# Patient Record
Sex: Female | Born: 1975 | Race: White | Hispanic: No | Marital: Married | Smoking: Never smoker
Health system: Southern US, Community
[De-identification: ages and names within clinical notes are randomized; demographics above are authoritative.]

## PROBLEM LIST (undated history)

## (undated) DIAGNOSIS — T4145XA Adverse effect of unspecified anesthetic, initial encounter: Secondary | ICD-10-CM

## (undated) DIAGNOSIS — I10 Essential (primary) hypertension: Secondary | ICD-10-CM

## (undated) DIAGNOSIS — I499 Cardiac arrhythmia, unspecified: Secondary | ICD-10-CM

## (undated) DIAGNOSIS — J386 Stenosis of larynx: Secondary | ICD-10-CM

## (undated) DIAGNOSIS — T8859XA Other complications of anesthesia, initial encounter: Secondary | ICD-10-CM

## (undated) DIAGNOSIS — J40 Bronchitis, not specified as acute or chronic: Secondary | ICD-10-CM

## (undated) DIAGNOSIS — R112 Nausea with vomiting, unspecified: Secondary | ICD-10-CM

## (undated) DIAGNOSIS — M1990 Unspecified osteoarthritis, unspecified site: Secondary | ICD-10-CM

## (undated) DIAGNOSIS — K219 Gastro-esophageal reflux disease without esophagitis: Secondary | ICD-10-CM

## (undated) DIAGNOSIS — Z8489 Family history of other specified conditions: Secondary | ICD-10-CM

## (undated) DIAGNOSIS — R0602 Shortness of breath: Secondary | ICD-10-CM

## (undated) DIAGNOSIS — R51 Headache: Secondary | ICD-10-CM

## (undated) DIAGNOSIS — E119 Type 2 diabetes mellitus without complications: Secondary | ICD-10-CM

## (undated) DIAGNOSIS — Z9889 Other specified postprocedural states: Secondary | ICD-10-CM

## (undated) HISTORY — PX: TONGUE FLAP RELEASE: SHX2537

## (undated) HISTORY — DX: Essential (primary) hypertension: I10

## (undated) HISTORY — DX: Unspecified osteoarthritis, unspecified site: M19.90

## (undated) HISTORY — PX: OTHER SURGICAL HISTORY: SHX169

## (undated) HISTORY — DX: Stenosis of larynx: J38.6

---

## 2000-09-07 ENCOUNTER — Other Ambulatory Visit: Admission: RE | Admit: 2000-09-07 | Discharge: 2000-09-07 | Payer: Self-pay | Admitting: *Deleted

## 2005-11-15 ENCOUNTER — Encounter: Payer: Self-pay | Admitting: General Practice

## 2008-04-25 ENCOUNTER — Ambulatory Visit: Payer: Self-pay | Admitting: Podiatry

## 2012-07-18 ENCOUNTER — Other Ambulatory Visit (HOSPITAL_COMMUNITY): Payer: Self-pay | Admitting: Otolaryngology

## 2012-07-19 ENCOUNTER — Encounter (HOSPITAL_COMMUNITY): Payer: Self-pay

## 2012-07-19 ENCOUNTER — Encounter (HOSPITAL_COMMUNITY): Payer: Self-pay | Admitting: Pharmacy Technician

## 2012-07-20 ENCOUNTER — Ambulatory Visit (HOSPITAL_COMMUNITY): Payer: 59 | Admitting: Certified Registered"

## 2012-07-20 ENCOUNTER — Encounter (HOSPITAL_COMMUNITY)
Admission: RE | Disposition: A | Discharge status: Home / Self Care | Payer: Self-pay | Source: Ambulatory Visit | Attending: Otolaryngology

## 2012-07-20 ENCOUNTER — Ambulatory Visit (HOSPITAL_COMMUNITY): Payer: 59

## 2012-07-20 ENCOUNTER — Encounter (HOSPITAL_COMMUNITY): Payer: Self-pay | Admitting: *Deleted

## 2012-07-20 ENCOUNTER — Encounter (HOSPITAL_COMMUNITY): Payer: Self-pay | Admitting: Certified Registered"

## 2012-07-20 ENCOUNTER — Ambulatory Visit (HOSPITAL_COMMUNITY)
Admission: RE | Admit: 2012-07-20 | Discharge: 2012-07-20 | Disposition: A | Discharge status: Home / Self Care | Payer: 59 | Source: Ambulatory Visit | Attending: Otolaryngology | Admitting: Otolaryngology

## 2012-07-20 DIAGNOSIS — K219 Gastro-esophageal reflux disease without esophagitis: Secondary | ICD-10-CM | POA: Insufficient documentation

## 2012-07-20 DIAGNOSIS — J386 Stenosis of larynx: Secondary | ICD-10-CM | POA: Insufficient documentation

## 2012-07-20 HISTORY — DX: Gastro-esophageal reflux disease without esophagitis: K21.9

## 2012-07-20 HISTORY — DX: Shortness of breath: R06.02

## 2012-07-20 HISTORY — DX: Headache: R51

## 2012-07-20 HISTORY — DX: Cardiac arrhythmia, unspecified: I49.9

## 2012-07-20 HISTORY — DX: Family history of other specified conditions: Z84.89

## 2012-07-20 HISTORY — PX: MICROLARYNGOSCOPY WITH LASER AND BALLOON DILATION: SHX5973

## 2012-07-20 HISTORY — DX: Other specified postprocedural states: Z98.890

## 2012-07-20 HISTORY — DX: Bronchitis, not specified as acute or chronic: J40

## 2012-07-20 HISTORY — DX: Adverse effect of unspecified anesthetic, initial encounter: T41.45XA

## 2012-07-20 HISTORY — DX: Nausea with vomiting, unspecified: R11.2

## 2012-07-20 HISTORY — DX: Other complications of anesthesia, initial encounter: T88.59XA

## 2012-07-20 LAB — CBC
MCV: 83.3 fL (ref 78.0–100.0)
Platelets: 235 10*3/uL (ref 150–400)
RBC: 5.04 MIL/uL (ref 3.87–5.11)
WBC: 6.8 10*3/uL (ref 4.0–10.5)

## 2012-07-20 LAB — BASIC METABOLIC PANEL
CO2: 23 mEq/L (ref 19–32)
Chloride: 102 mEq/L (ref 96–112)
Creatinine, Ser: 0.7 mg/dL (ref 0.50–1.10)
Potassium: 4.3 mEq/L (ref 3.5–5.1)

## 2012-07-20 LAB — HCG, SERUM, QUALITATIVE: Preg, Serum: NEGATIVE

## 2012-07-20 SURGERY — INJECTION, MITOMYCIN C
Anesthesia: General | Site: Throat | Wound class: Clean Contaminated

## 2012-07-20 MED ORDER — PROPOFOL INFUSION 10 MG/ML OPTIME
INTRAVENOUS | Status: DC | PRN
  Administered 2012-07-20: 200 ug/kg/min via INTRAVENOUS

## 2012-07-20 MED ORDER — LACTATED RINGERS IV SOLN
INTRAVENOUS | Status: DC | PRN
  Administered 2012-07-20: 11:00:00 via INTRAVENOUS

## 2012-07-20 MED ORDER — OXYCODONE HCL 5 MG PO TABS: 5.0000 mg | ORAL_TABLET | Freq: Once | ORAL | Status: DC | PRN

## 2012-07-20 MED ORDER — FENTANYL CITRATE 0.05 MG/ML IJ SOLN
25.0000 ug | INTRAMUSCULAR | Status: DC | PRN
  Administered 2012-07-20: 50 ug via INTRAVENOUS

## 2012-07-20 MED ORDER — PROMETHAZINE HCL 25 MG/ML IJ SOLN: 6.2500 mg | INTRAMUSCULAR | Status: DC | PRN

## 2012-07-20 MED ORDER — HYDROCODONE-ACETAMINOPHEN 5-325 MG PO TABS: 1.0000 | ORAL_TABLET | Freq: Four times a day (QID) | ORAL | Status: DC | PRN

## 2012-07-20 MED ORDER — PROPOFOL 10 MG/ML IV BOLUS
INTRAVENOUS | Status: DC | PRN
  Administered 2012-07-20: 200 mg via INTRAVENOUS

## 2012-07-20 MED ORDER — FENTANYL CITRATE 0.05 MG/ML IJ SOLN
INTRAMUSCULAR | Status: AC
  Filled 2012-07-20: qty 2

## 2012-07-20 MED ORDER — MIDAZOLAM HCL 2 MG/2ML IJ SOLN: 0.5000 mg | Freq: Once | INTRAMUSCULAR | Status: DC | PRN

## 2012-07-20 MED ORDER — SUCCINYLCHOLINE CHLORIDE 20 MG/ML IJ SOLN
INTRAMUSCULAR | Status: DC | PRN
  Administered 2012-07-20: 200 mg via INTRAVENOUS

## 2012-07-20 MED ORDER — MITOMYCIN-C INJECTION USE IN OR ONLY (0.4 MG/ML)
INTRAVENOUS | Status: DC | PRN
  Administered 2012-07-20: 1 mL

## 2012-07-20 MED ORDER — EPINEPHRINE HCL (NASAL) 0.1 % NA SOLN
NASAL | Status: DC | PRN
  Administered 2012-07-20: 30 mL via NASAL

## 2012-07-20 MED ORDER — MIDAZOLAM HCL 5 MG/5ML IJ SOLN
INTRAMUSCULAR | Status: DC | PRN
  Administered 2012-07-20 (×2): 2 mg via INTRAVENOUS

## 2012-07-20 MED ORDER — OXYCODONE HCL 5 MG/5ML PO SOLN: 5.0000 mg | Freq: Once | ORAL | Status: DC | PRN

## 2012-07-20 MED ORDER — NONFORMULARY OR COMPOUNDED ITEM
1.0000 mL | Status: DC
  Filled 2012-07-20: qty 1

## 2012-07-20 MED ORDER — DEXAMETHASONE SODIUM PHOSPHATE 10 MG/ML IJ SOLN
INTRAMUSCULAR | Status: DC | PRN
  Administered 2012-07-20: 12 mg via INTRAVENOUS

## 2012-07-20 MED ORDER — SUCCINYLCHOLINE CHLORIDE 20 MG/ML IJ SOLN
250.0000 mg | INTRAVENOUS | Status: DC | PRN
  Administered 2012-07-20: 8 mg/min via INTRAVENOUS

## 2012-07-20 MED ORDER — 0.9 % SODIUM CHLORIDE (POUR BTL) OPTIME
TOPICAL | Status: DC | PRN
  Administered 2012-07-20: 1000 mL

## 2012-07-20 MED ORDER — EPINEPHRINE HCL (NASAL) 0.1 % NA SOLN
NASAL | Status: AC
  Filled 2012-07-20: qty 30

## 2012-07-20 MED ORDER — LIDOCAINE HCL (CARDIAC) 20 MG/ML IV SOLN
INTRAVENOUS | Status: DC | PRN
  Administered 2012-07-20: 20 mg via INTRAVENOUS

## 2012-07-20 MED ORDER — STERILE WATER FOR IRRIGATION IR SOLN
Status: DC | PRN
  Administered 2012-07-20: 1000 mL

## 2012-07-20 MED ORDER — FENTANYL CITRATE 0.05 MG/ML IJ SOLN
INTRAMUSCULAR | Status: DC | PRN
  Administered 2012-07-20: 50 ug via INTRAVENOUS

## 2012-07-20 MED ORDER — ACETAMINOPHEN 10 MG/ML IV SOLN
1000.0000 mg | Freq: Four times a day (QID) | INTRAVENOUS | Status: DC
  Administered 2012-07-20: 1000 mg via INTRAVENOUS

## 2012-07-20 MED ORDER — ACETAMINOPHEN 10 MG/ML IV SOLN
INTRAVENOUS | Status: AC
  Filled 2012-07-20: qty 100

## 2012-07-20 MED ORDER — MEPERIDINE HCL 25 MG/ML IJ SOLN: 6.2500 mg | INTRAMUSCULAR | Status: DC | PRN

## 2012-07-20 SURGICAL SUPPLY — 35 items
APPLICATOR COTTON TIP 6IN STRL (MISCELLANEOUS) ×3 IMPLANT
BALLN PULM 15 16.5 18X75 (BALLOONS)
BALLOON PULM 15 16.5 18X75 (BALLOONS) IMPLANT
BIT DRILL QC 2.7MM (BIT) ×2 IMPLANT
CANISTER SUCTION 2500CC (MISCELLANEOUS) ×3 IMPLANT
CLOTH BEACON ORANGE TIMEOUT ST (SAFETY) ×3 IMPLANT
CONT SPEC 4OZ CLIKSEAL STRL BL (MISCELLANEOUS) IMPLANT
COVER MAYO STAND STRL (DRAPES) ×3 IMPLANT
COVER TABLE BACK 60X90 (DRAPES) ×3 IMPLANT
CRADLE DONUT ADULT HEAD (MISCELLANEOUS) IMPLANT
DEPRESSOR TONGUE BLADE STERILE (MISCELLANEOUS) IMPLANT
DRAPE PROXIMA HALF (DRAPES) ×3 IMPLANT
GAUZE SPONGE 4X4 16PLY XRAY LF (GAUZE/BANDAGES/DRESSINGS) ×3 IMPLANT
GLOVE BIO SURGEON STRL SZ7.5 (GLOVE) ×3 IMPLANT
GLOVE SURG SS PI 6.5 STRL IVOR (GLOVE) ×6 IMPLANT
GLOVE SURG SS PI 7.5 STRL IVOR (GLOVE) ×3 IMPLANT
GOWN STRL NON-REIN LRG LVL3 (GOWN DISPOSABLE) IMPLANT
GUARD TEETH (MISCELLANEOUS) IMPLANT
KIT BASIN OR (CUSTOM PROCEDURE TRAY) ×3 IMPLANT
KIT ROOM TURNOVER OR (KITS) ×3 IMPLANT
MARKER SKIN DUAL TIP RULER LAB (MISCELLANEOUS) IMPLANT
NS IRRIG 1000ML POUR BTL (IV SOLUTION) ×3 IMPLANT
PAD ARMBOARD 7.5X6 YLW CONV (MISCELLANEOUS) ×6 IMPLANT
PAD EYE OVAL STERILE LF (GAUZE/BANDAGES/DRESSINGS) IMPLANT
PATTIES SURGICAL .5 X.5 (GAUZE/BANDAGES/DRESSINGS) ×3 IMPLANT
PATTIES SURGICAL .5 X3 (DISPOSABLE) IMPLANT
PROS DRILL BIT QC 2.7MM (BIT) ×3
SOLUTION ANTI FOG 6CC (MISCELLANEOUS) IMPLANT
SPONGE GAUZE 4X4 12PLY (GAUZE/BANDAGES/DRESSINGS) IMPLANT
STAPLER TA60 3.5 010317 (STAPLE) ×3 IMPLANT
SUT SILK 2 0 SH (SUTURE) ×3 IMPLANT
SYR INFLATE BILIARY GAUGE (MISCELLANEOUS) ×3 IMPLANT
TOWEL OR 17X24 6PK STRL BLUE (TOWEL DISPOSABLE) ×6 IMPLANT
TUBE CONNECTING 12X1/4 (SUCTIONS) ×3 IMPLANT
WATER STERILE IRR 1000ML POUR (IV SOLUTION) ×3 IMPLANT

## 2012-07-20 NOTE — Anesthesia Preprocedure Evaluation (Addendum)
Anesthesia Evaluation  Patient identified by MRN, date of birth, ID band Patient awake    Reviewed: Allergy & Precautions, H&P , NPO status , Patient's Chart, lab work & pertinent test results  History of Anesthesia Complications (+) PONV  Airway Mallampati: I TM Distance: >3 FB Neck ROM: Full    Dental  (+) Teeth Intact and Dental Advisory Given   Pulmonary shortness of breath and at rest,  breath sounds clear to auscultation  Pulmonary exam normal       Cardiovascular + dysrhythmias (palpitations) Rhythm:Regular Rate:Normal     Neuro/Psych  Headaches,    GI/Hepatic Neg liver ROS, GERD-  Medicated and Controlled,  Endo/Other  Morbid obesity  Renal/GU negative Renal ROS     Musculoskeletal   Abdominal (+) + obese,   Peds  Hematology   Anesthesia Other Findings   Reproductive/Obstetrics                          Anesthesia Physical Anesthesia Plan  ASA: II  Anesthesia Plan: General   Post-op Pain Management:    Induction: Intravenous and Rapid sequence  Airway Management Planned: Oral ETT  Additional Equipment:   Intra-op Plan:   Post-operative Plan: Extubation in OR  Informed Consent: I have reviewed the patients History and Physical, chart, labs and discussed the procedure including the risks, benefits and alternatives for the proposed anesthesia with the patient or authorized representative who has indicated his/her understanding and acceptance.   Dental advisory given  Plan Discussed with: Anesthesiologist, Surgeon and CRNA  Anesthesia Plan Comments: (Jet ventilation Plan routine monitors, GETA)       Anesthesia Quick Evaluation

## 2012-07-20 NOTE — Brief Op Note (Signed)
07/20/2012  11:36 AM  PATIENT:  Lisa [name]  37 y.o. female  PRE-OPERATIVE DIAGNOSIS:  SUBGLOTTIC STENOSIS  POST-OPERATIVE DIAGNOSIS:  SUBGLOTTIC STENOSIS  PROCEDURE:  SMDL with CO2 laser dilation of subglottic stenosis, mitomycin C application  SURGEON:  Surgeon(s) and Role:    * Christia Reading, MD - Primary  PHYSICIAN ASSISTANT:   ASSISTANTS: none   ANESTHESIA:   general  EBL:     BLOOD ADMINISTERED:none  DRAINS: none   SPECIMEN:  No Specimen  DISPOSITION OF SPECIMEN:  N/A  COUNTS:  YES  TOURNIQUET:  * No tourniquets in log *  DICTATION: .Other Dictation: Dictation Number [id number]  PLAN OF CARE: Discharge to home after PACU  PATIENT DISPOSITION:  PACU - hemodynamically stable.   Delay start of Pharmacological VTE agent (>24hrs) due to surgical blood loss or risk of bleeding: no

## 2012-07-20 NOTE — Anesthesia Postprocedure Evaluation (Signed)
  Anesthesia Post-op Note  Patient: Lisa [name]  Procedure(s) Performed: Procedure(s) with comments: MITOMYCIN C INJECTION (N/A) MICROLARYNGOSCOPY WITH LASER AND BALLOON DILATION (N/A) - WITH JET VENTURI VENTILATION  Patient Location: PACU  Anesthesia Type:General  Level of Consciousness: awake, alert , oriented and patient cooperative  Airway and Oxygen Therapy: Patient Spontanous Breathing  Post-op Pain: none  Post-op Assessment: Post-op Vital signs reviewed, Patient's Cardiovascular Status Stable, Respiratory Function Stable, Patent Airway, No signs of Nausea or vomiting and Pain level controlled  Post-op Vital Signs: Reviewed and stable  Complications: No apparent anesthesia complications

## 2012-07-20 NOTE — H&P (Signed)
Lisa [name] is an 37 y.o. female.   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  Past Medical History  Diagnosis Date  . Complication of anesthesia   . PONV (postoperative nausea and vomiting)   . Family history of anesthesia complication     "morphine night terrors"  . Dysrhythmia     "does not see cardiologist"  . Shortness of breath     "wheezing"  . Bronchitis     hx of  . GERD (gastroesophageal reflux disease)   . Headache     "migraines"    Past Surgical History  Procedure Laterality Date  . Cesarean section      x 2  . Widom extractions    . Tongue flap release      History reviewed. No pertinent family history. Social History:  reports that she has never smoked. She does not have any smokeless tobacco history on file. She reports that  drinks alcohol. She reports that she does not use illicit drugs.  Allergies:  Allergies  Allergen Reactions  . Latex Hives    Rash and hives    Medications Prior to Admission  Medication Sig Dispense Refill  . budesonide (PULMICORT) 180 MCG/ACT inhaler Inhale 1 puff into the lungs 2 (two) times daily.      Marland Kitchen omeprazole (PRILOSEC) 40 MG capsule Take 40 mg by mouth 2 (two) times daily.        Results for orders placed during the hospital encounter of 07/20/12 (from the past 48 hour(s))  CBC     Status: None   Collection Time    07/20/12  9:04 AM      Result Value Range   WBC 6.8  4.0 - 10.5 K/uL   RBC 5.04  3.87 - 5.11 MIL/uL   Hemoglobin 14.6  12.0 - 15.0 g/dL   HCT 96.0  45.4 - 09.8 %   MCV 83.3  78.0 - 100.0 fL   MCH 29.0  26.0 - 34.0 pg   MCHC 34.8  30.0 - 36.0 g/dL   RDW 11.9  14.7 - 82.9 %   Platelets 235  150 - 400 K/uL  BASIC METABOLIC PANEL     Status: Abnormal   Collection Time    07/20/12  9:12 AM      Result Value  Range   Sodium 136  135 - 145 mEq/L   Potassium 4.3  3.5 - 5.1 mEq/L   Chloride 102  96 - 112 mEq/L   CO2 23  19 - 32 mEq/L   Glucose, Bld 109 (*) 70 - 99 mg/dL   BUN 15  6 - 23 mg/dL   Creatinine, Ser 5.62  0.50 - 1.10 mg/dL   Calcium 9.8  8.4 - 13.0 mg/dL   GFR calc non Af Amer >90  >90 mL/min   GFR calc Af Amer >90  >90 mL/min   Comment:            The eGFR has been calculated     using the CKD EPI equation.     This calculation has not been     validated in all clinical     situations.     eGFR's persistently     <90 mL/min signify     possible Chronic Kidney Disease.  HCG, SERUM, QUALITATIVE  Status: None   Collection Time    07/20/12  9:12 AM      Result Value Range   Preg, Serum NEGATIVE  NEGATIVE   Comment:            THE SENSITIVITY OF THIS     METHODOLOGY IS >10 mIU/mL.   Dg Chest 2 View  07/20/2012  *RADIOLOGY REPORT*  Clinical Data: Preop.  Subglottic stenosis.  CHEST - 2 VIEW  Comparison: None.  Findings: Normal heart size.  Low volumes.  No obvious consolidation or mass.  No pneumothorax.  No pleural fluid.  Mild bronchitic changes are likely chronic.  IMPRESSION: No active cardiopulmonary disease.   Original Report Authenticated By: Jolaine Click, M.D.     Review of Systems  All other systems reviewed and are negative.    Blood pressure 133/85, pulse 99, temperature 97.3 F (36.3 C), temperature source Oral, resp. rate 20, height 5\' 5"  (1.651 m), weight 123.8 kg (272 lb 14.9 oz), last menstrual period 07/06/2012, SpO2 99.00%. Physical Exam  Constitutional: She is oriented to person, place, and time. She appears well-developed and well-nourished. No distress.  HENT:  Head: Normocephalic and atraumatic.  Right Ear: External ear normal.  Left Ear: External ear normal.  Nose: Nose normal.  Mouth/Throat: Oropharynx is clear and moist.  Eyes: Conjunctivae and EOM are normal. Pupils are equal, round, and reactive to light.  Neck: Normal range of motion. Neck  supple.  Cardiovascular: Normal rate.   Respiratory: Effort normal.  Mild soft inspiratory stridor, especially with deep inspiration.  GI:  Did not examine.  Genitourinary:  Did not examine.  Musculoskeletal: Normal range of motion.  Neurological: She is alert and oriented to person, place, and time. No cranial nerve deficit.  Skin: Skin is warm and dry.  Psychiatric: She has a normal mood and affect. Her behavior is normal. Judgment and thought content normal.     Assessment/Plan Subglottic stenosis To OR for SMDL with CO2 laser subglottic dilation, application of mitomycin C.  BATES, DWIGHT 07/20/2012, 10:22 AM

## 2012-07-20 NOTE — Progress Notes (Signed)
Attempted to call Dr. Jenne Pane to have him sign his orders.  Office not open due to weather.  Dr. Kelli Churn on call.

## 2012-07-20 NOTE — Transfer of Care (Signed)
Immediate Anesthesia Transfer of Care Note  Patient: Lisa [name]  Procedure(s) Performed: Procedure(s) with comments: MITOMYCIN C INJECTION (N/A) MICROLARYNGOSCOPY WITH LASER AND BALLOON DILATION (N/A) - WITH JET VENTURI VENTILATION  Patient Location: PACU  Anesthesia Type:General  Level of Consciousness: awake and alert   Airway & Oxygen Therapy: Patient Spontanous Breathing and Patient connected to nasal cannula oxygen  Post-op Assessment: Report given to PACU RN and Post -op Vital signs reviewed and stable  Post vital signs: Reviewed and stable  Complications: No apparent anesthesia complications

## 2012-07-21 NOTE — Op Note (Signed)
NAMETRESA, JOLLEY NO.:  [id number]  MEDICAL RECORD NO.:  [id number]  LOCATION:  [facility]                         FACILITY:  [facility]  PHYSICIAN:  Antony Contras, MD     DATE OF BIRTH:  1976-01-07  DATE OF PROCEDURE:  07/20/2012 DATE OF DISCHARGE:  07/20/2012                              OPERATIVE REPORT   PREOPERATIVE DIAGNOSIS:  Subglottic stenosis.  POSTOPERATIVE DIAGNOSIS:  Subglottic stenosis.  PROCEDURE:  Suspended microdirect laryngoscopy with CO2 laser dilation of subglottic stenosis, and application of mitomycin-C.  SURGEON:  Antony Contras, MD.  ANESTHESIA:  General jet Venturi ventilation.  COMPLICATIONS:  None.  INDICATION:  The patient is a 37 year old female who has a 34-month history of gradually worsening difficulty breathing that was found to have subglottic stenosis of unknown origin.  She presents to the operating room for surgical management.  FINDINGS:  The patient was found to have an approximately 50% stenosis with the left side stenotic on more superiorly in the subglottic region and the right side stenotic bit more inferiorly, about a centimeter or so deep to the left side.  DESCRIPTION OF PROCEDURE:  The patient was identified in the holding room and informed consent having been obtained, discussion of risks, benefits, alternatives, the patient was brought to the office suite, put on the operating table in supine position.  Anesthesia was induced.  The patient was maintained via mask ventilation.  The eyes were taped closed, and the bed was turned 90 degrees from anesthesia.  The patient was given intravenous steroids during the case.  A tooth guard was placed over the upper teeth and a Storz laryngoscope was then used and placed in the glottic position and suspended to the Mayo stand using a Lewy arm.  Jet Venturi ventilation was initiated.  The 0-degree telescope was used to make a photograph of the airway.  At this point, the  patient's oxygen saturations were dipping, so a 6 endotracheal tube was placed through the laryngoscope without inflating the cuff, and the patient was successfully ventilated.  After her oxygen saturation returned to normal, the tube was removed, and the CO2 laser was then used on a setting of 8 watts to make radial cuts at 8 o'clock superiorly and at 2 o'clock and 4 o'clock inferiorly.  The char was removed using the epinephrine-soaked pledget.  The patient was jet ventilated during this portion of the procedure.  The large tracheal balloon was then inserted into the airway and inflated to 7 atmospheres of pressure for 60 seconds twice.  Ventilation was held during the each time, and the patient was ventilated in between.  A pledget soaked with mitomycin-C, 0.4 mg per mL, 1.5 mL was then placed around a segment of suction tubing and secured with suture.  This was then held in the region of the stenosis for 3 minutes while the patient was ventilated through the tubing using the jet Venturi.  The tubing and pledget were then removed, and the airway was suctioned.  A 0-degree telescope was used to make a postoperative photograph.  The laryngoscope was then taken out of suspension and removed from the patient's mouth while suctioning the airway.  The tooth  guard was removed.  The patient returned to mask ventilation.  She was ventilated successfully and returned back to Anesthesia for wake up.  She was moved to recovery room in stable condition.     Antony Contras, MD     DDB/MEDQ  D:  07/20/2012  T:  07/21/2012  Job:  [id number]

## 2012-07-23 ENCOUNTER — Encounter (HOSPITAL_COMMUNITY): Payer: Self-pay | Admitting: Otolaryngology

## 2013-04-03 ENCOUNTER — Ambulatory Visit: Payer: Self-pay | Admitting: Obstetrics and Gynecology

## 2013-04-03 LAB — CBC
HCT: 41.2 % (ref 35.0–47.0)
MCH: 28.6 pg (ref 26.0–34.0)
MCHC: 34 g/dL (ref 32.0–36.0)
WBC: 7.4 10*3/uL (ref 3.6–11.0)

## 2013-04-08 ENCOUNTER — Ambulatory Visit: Payer: Self-pay | Admitting: Obstetrics and Gynecology

## 2014-06-27 ENCOUNTER — Emergency Department: Payer: Self-pay | Admitting: Emergency Medicine

## 2014-07-14 ENCOUNTER — Encounter: Payer: Self-pay | Admitting: Cardiovascular Disease

## 2014-07-14 ENCOUNTER — Ambulatory Visit (INDEPENDENT_AMBULATORY_CARE_PROVIDER_SITE_OTHER): Payer: 59 | Admitting: Cardiovascular Disease

## 2014-07-14 ENCOUNTER — Encounter (INDEPENDENT_AMBULATORY_CARE_PROVIDER_SITE_OTHER): Payer: Self-pay

## 2014-07-14 VITALS — BP 137/84 | HR 78 | Ht 63.0 in | Wt 260.5 lb

## 2014-07-14 DIAGNOSIS — R55 Syncope and collapse: Secondary | ICD-10-CM | POA: Insufficient documentation

## 2014-07-14 HISTORY — DX: Syncope and collapse: R55

## 2014-07-14 NOTE — Patient Instructions (Signed)
Your physician has requested that you have an echocardiogram. Echocardiography is a painless test that uses sound waves to create images of your heart. It provides your doctor with information about the size and shape of your heart and how well your heart's chambers and valves are working. This procedure takes approximately one hour. There are no restrictions for this procedure.  Follow up as needed 

## 2014-07-14 NOTE — Assessment & Plan Note (Addendum)
I suspect that these episodes were likely vasovagal. She had carotid Doppler done with Dr. dew which showed no significant disease. EKG is unremarkable and cardiac physical exam is normal. Due to recurrent symptoms, I requested an echocardiogram to ensure no structural heart abnormalities. Her symptoms become more frequent, Holter monitor or some form of outpatient telemetry might be needed.

## 2014-07-14 NOTE — Progress Notes (Signed)
HPI  Lisa [name] is a pleasant 39 year old female who was referred from the emergency room at [facility] for evaluation of presyncope. She is a vascular lab tech. She has no previous cardiac history. She has chronic medical conditions that include pre-diabetes and obesity. She was started on phentermine few months ago. The day after Christmas, she had an episode were she felt dizzy, lightheaded and nauseous. She had tingling in both arms and felt close to passing out. She checked her pulse after that and her heart rate was 40 bpm. She had an upper respiratory tract infection around that time and took a decongestant. She had a similar episode about 10 days ago while she was at work. She had a cold and was not feeling well all day but was working. She tries to eat some soup but she felt nauseous. This was followed by feeling clammy and tingly. Her color turned white. She was sent to the emergency room at [facility]. Basic workup was negative including labs and EKG. She has no history of hypertension or hyperlipidemia. There is no strong family history of coronary artery disease except for her grandfather who had myocardial infarction in his 58s. She denies palpitations. No chest pain or shortness of breath.  Allergies  Allergen Reactions  . Latex Hives    Rash and hives     Current Outpatient Prescriptions on File Prior to Visit  Medication Sig Dispense Refill  . omeprazole (PRILOSEC) 40 MG capsule Take 40 mg by mouth daily.      No current facility-administered medications on file prior to visit.     Past Medical History  Diagnosis Date  . Complication of anesthesia   . PONV (postoperative nausea and vomiting)   . Family history of anesthesia complication     "morphine night terrors"  . Dysrhythmia     "does not see cardiologist"  . Shortness of breath     "wheezing"  . Bronchitis     hx of  . GERD (gastroesophageal reflux disease)   . Headache(784.0)     "migraines"  . Hypertension       Past Surgical History  Procedure Laterality Date  . Cesarean section      x 2  . Widom extractions    . Tongue flap release    . Microlaryngoscopy with laser and balloon dilation N/A 07/20/2012    Procedure: MICROLARYNGOSCOPY WITH LASER AND BALLOON DILATION;  Surgeon: Christia Reading, MD;  [facility] OR;  Service: ENT;  Laterality: N/A;  WITH JET VENTURI VENTILATION     Family History  Problem Relation Age of Onset  . Hypertension Mother   . Hyperlipidemia Mother   . Hypertension Father   . Hyperlipidemia Father      History   Social History  . Marital Status: Married    Spouse Name: N/A  . Number of Children: N/A  . Years of Education: N/A   Occupational History  . Not on file.   Social History Main Topics  . Smoking status: Never Smoker   . Smokeless tobacco: Not on file  . Alcohol Use: Yes     Comment: "social"  . Drug Use: No  . Sexual Activity: Not on file   Other Topics Concern  . Not on file   Social History Narrative     ROS A 10 point review of system was performed. It is negative other than that mentioned in the history of present illness.   PHYSICAL EXAM   BP 137/84 mmHg  Pulse 78  Ht 5\' 3"  (1.6 m)  Wt 260 lb 8 oz (118.162 kg)  BMI 46.16 kg/m2 Constitutional: She is oriented to person, place, and time. She appears well-developed and well-nourished. No distress.  HENT: No nasal discharge.  Head: Normocephalic and atraumatic.  Eyes: Pupils are equal and round. No discharge.  Neck: Normal range of motion. Neck supple. No JVD present. No thyromegaly present.  Cardiovascular: Normal rate, regular rhythm, normal heart sounds. Exam reveals no gallop and no friction rub. No murmur heard.  Pulmonary/Chest: Effort normal and breath sounds normal. No stridor. No respiratory distress. She has no wheezes. She has no rales. She exhibits no tenderness.  Abdominal: Soft. Bowel sounds are normal. She exhibits no distension. There is no tenderness. There  is no rebound and no guarding.  Musculoskeletal: Normal range of motion. She exhibits no edema and no tenderness.  Neurological: She is alert and oriented to person, place, and time. Coordination normal.  Skin: Skin is warm and dry. No rash noted. She is not diaphoretic. No erythema. No pallor.  Psychiatric: She has a normal mood and affect. Her behavior is normal. Judgment and thought content normal.     ZOX:WRUEAEKG:Sinus  Rhythm  Low voltage in precordial leads.   -Prominent R(V1) -nonspecific.   ABNORMAL    ASSESSMENT AND PLAN

## 2014-07-24 ENCOUNTER — Other Ambulatory Visit: Payer: Self-pay

## 2014-07-24 ENCOUNTER — Other Ambulatory Visit (INDEPENDENT_AMBULATORY_CARE_PROVIDER_SITE_OTHER): Payer: 59

## 2014-07-24 DIAGNOSIS — R55 Syncope and collapse: Secondary | ICD-10-CM | POA: Diagnosis not present

## 2014-09-05 NOTE — Op Note (Signed)
PATIENT NAME:  Charlann LangeHANKINS, Alijah K MR#:  [id number] DATE OF BIRTH:  [birth year]  DATE OF PROCEDURE:  04/08/2013  PREOPERATIVE DIAGNOSES:  1. Lost Mirena intrauterine device strings.  2. Desiring long-acting reversible contraception.   POSTOPERATIVE DIAGNOSES:  1. Lost Mirena intrauterine device strings.  2. Desiring long-acting reversible contraception.   OPERATION: Includes hysteroscopic intrauterine device removal and Nexplanon insertion.   ANESTHESIA: General.    SURGEON: Morry Veiga S. Valentino Saxonherry, M.D.   ESTIMATED BLOOD LOSS: Minimal.   OPERATIVE FLUIDS: 700 mL.    URINE: 200 mL.    COMPLICATIONS: None.    FINDINGS: Included IUD strings and Mirena IUD were noted at the endocervical os.   SPECIMEN: Included the Mirena IUD.   PROCEDURE: The patient was taken to the operating room and was placed under general anesthesia without difficulty. She was then prepped and draped in a normal sterile fashion. A sterile speculum was placed into the patient's vagina, and the cervix was grasped using a single-tooth tenaculum at the anterior lip of the cervix. The uterus was then sounded to 10 cm. The cervix was then dilated using the [facility] dilators. Next, the hysteroscope was then introduced into the uterine cavity by using normal saline as the distending medium. The Mirena IUD was noted just beyond the endocervical os, with the strings also visualized there and progressing into the endometrial cavity. A grasper was used to grasp the IUD strings, and the Mirena IUD was removed from the endometrial cavity. Next, the hysteroscope was advanced, and the endometrial cavity was visualized. The lining was noted to be thin. Both tubal ostia were identified. After this, the hysteroscope was removed from the uterine cavity. The single-tooth tenaculum was then removed from the anterior lip of the cervix. Attention was then turned to the patient's left arm. The insertion site was selected approximately 8 to 10 cm from the  medial epicondyle. It was marked using a sterile marker. Next, the procedure area was prepped using Betadine and draped in a sterile fashion. A sterile syringe was used to inject approximately 5 cc of 0.25% Marcaine to anticipated implant site. A Nexplanon trocar was then inserted subcutaneously, and the Nexplanon capsule was delivered subcutaneously. The trocar was removed from the insertion site. The Nexplanon capsule was then palpated by physician to ensure satisfactory placement. Next, pressure was applied to the insertion site, and Dermabond was placed. Also, the surgical area was wrapped using sterile gauze. The patient tolerated the procedure well without complication. She was taken to the recovery room in stable condition.   ____________________________ Jacques EarthlyAnika S. Valentino Saxonherry, MD asc:gb D: 04/08/2013 20:50:10 ET T: 04/09/2013 04:49:57 ET JOB#: [id number]  cc: Jacques EarthlyAnika S. Valentino Saxonherry, MD, <Dictator> Fabian NovemberANIKA S Merril Isakson MD ELECTRONICALLY SIGNED 04/16/2013 16:17

## 2014-10-22 ENCOUNTER — Ambulatory Visit: Payer: Self-pay | Admitting: Obstetrics and Gynecology

## 2014-10-29 ENCOUNTER — Other Ambulatory Visit: Payer: 59

## 2014-10-29 DIAGNOSIS — E790 Hyperuricemia without signs of inflammatory arthritis and tophaceous disease: Secondary | ICD-10-CM

## 2014-10-29 DIAGNOSIS — E785 Hyperlipidemia, unspecified: Secondary | ICD-10-CM

## 2014-10-30 LAB — LIPID PANEL
CHOLESTEROL TOTAL: 172 mg/dL (ref 100–199)
Chol/HDL Ratio: 4.4 ratio units (ref 0.0–4.4)
HDL: 39 mg/dL — AB (ref >39)
LDL Calculated: 89 mg/dL (ref 0–99)
TRIGLYCERIDES: 221 mg/dL — AB (ref 0–149)
VLDL CHOLESTEROL CAL: 44 mg/dL — AB (ref 5–40)

## 2014-10-30 LAB — HEMOGLOBIN A1C
Est. average glucose Bld gHb Est-mCnc: 120 mg/dL
Hgb A1c MFr Bld: 5.8 % — ABNORMAL HIGH (ref 4.8–5.6)

## 2014-11-07 ENCOUNTER — Ambulatory Visit (INDEPENDENT_AMBULATORY_CARE_PROVIDER_SITE_OTHER): Payer: 59 | Admitting: Obstetrics and Gynecology

## 2014-11-07 ENCOUNTER — Encounter: Payer: Self-pay | Admitting: Obstetrics and Gynecology

## 2014-11-07 ENCOUNTER — Telehealth: Payer: Self-pay | Admitting: *Deleted

## 2014-11-07 VITALS — BP 109/80 | HR 88 | Ht 63.0 in | Wt 244.3 lb

## 2014-11-07 DIAGNOSIS — R7309 Other abnormal glucose: Secondary | ICD-10-CM

## 2014-11-07 NOTE — Progress Notes (Signed)
Patient ID: Lisa [name], female   DOB: 1975-10-26, 39 y.o.   MRN: [id number]    S: denies any concerns, happy with weight loss so far, has not been taking prescribed medications due to s/e's, but has been increasing exercise and adjusting diet.  O: A&O x4 Well groomed See labs from 10/29/14   A: obesity Elevated glucose and HgA1C Vitamin d deficeincy  P: continue exercise and weight loss as she is currently doing, not interested in restarting medications.  Labs next week - fasting insulin and glucose, 2h PP glucose, along with Vit D and Thyriod panel.  RTC as needed.   Melody B urr, CNM

## 2014-11-10 ENCOUNTER — Telehealth: Payer: Self-pay | Admitting: *Deleted

## 2014-11-10 NOTE — Telephone Encounter (Signed)
Notified pt of normal lab results 

## 2015-02-10 ENCOUNTER — Telehealth: Payer: Self-pay | Admitting: Obstetrics and Gynecology

## 2015-02-10 NOTE — Telephone Encounter (Signed)
Can't call it in, but she can set up Nurse visit to get restarted

## 2015-02-10 NOTE — Telephone Encounter (Signed)
PT CALLED AND SHE WAS TRYING ON HER OWN TO LOSS WEIGHT AFTER COMING OFF THE PHENTERMINE BACK IN June/JULY, AND SHE STATED THAT IF SHE WASN'T HAVING ANY LUCK TO CALL BACK IN AND YOU WOULD WRITE THE SCRIPT FOR THE PHENTERMINE, AND SHE IS NOT HAVING ANY LUCK AND WANTED TO SEE IF YOU COULD WRITE HE A REFILL FOR THE PHENTERMINE AND B12.

## 2015-02-10 NOTE — Telephone Encounter (Signed)
appt made for weight management

## 2015-02-16 ENCOUNTER — Encounter: Payer: Self-pay | Admitting: Physician Assistant

## 2015-02-16 ENCOUNTER — Ambulatory Visit: Payer: Self-pay | Admitting: Family

## 2015-02-16 VITALS — BP 110/80 | Temp 98.2°F | Wt 245.0 lb

## 2015-02-16 DIAGNOSIS — J069 Acute upper respiratory infection, unspecified: Secondary | ICD-10-CM

## 2015-02-16 NOTE — Progress Notes (Signed)
S/ 4 d h/o cough and cold sxs, low grade fever/ chills, denies GI or GU c/o  O/ VSS NAD  ENT unremarkable  Neck supple Heart RSR Lungs clear A/ URI  P Viral timeline reviewed. Supportive measures. F/u prn

## 2015-03-11 ENCOUNTER — Ambulatory Visit: Payer: 59 | Admitting: Obstetrics and Gynecology

## 2015-03-11 ENCOUNTER — Encounter: Payer: Self-pay | Admitting: Obstetrics and Gynecology

## 2015-03-11 ENCOUNTER — Ambulatory Visit (INDEPENDENT_AMBULATORY_CARE_PROVIDER_SITE_OTHER): Payer: 59 | Admitting: Obstetrics and Gynecology

## 2015-03-11 MED ORDER — CYANOCOBALAMIN 1000 MCG/ML IJ SOLN: 1000.0000 ug | INTRAMUSCULAR | Status: DC

## 2015-03-11 MED ORDER — PHENTERMINE HCL 37.5 MG PO TABS: 37.5000 mg | ORAL_TABLET | Freq: Every day | ORAL | Status: DC

## 2015-04-08 ENCOUNTER — Ambulatory Visit: Payer: Self-pay | Admitting: Internal Medicine

## 2015-04-14 ENCOUNTER — Encounter: Payer: Self-pay | Admitting: Obstetrics and Gynecology

## 2015-04-23 ENCOUNTER — Encounter: Payer: 59 | Admitting: Obstetrics and Gynecology

## 2015-04-27 ENCOUNTER — Ambulatory Visit (INDEPENDENT_AMBULATORY_CARE_PROVIDER_SITE_OTHER): Payer: 59 | Admitting: Internal Medicine

## 2015-04-27 ENCOUNTER — Ambulatory Visit
Admission: RE | Admit: 2015-04-27 | Discharge: 2015-04-27 | Disposition: A | Discharge status: Home / Self Care | Payer: 59 | Source: Ambulatory Visit | Attending: Internal Medicine | Admitting: Internal Medicine

## 2015-04-27 ENCOUNTER — Ambulatory Visit
Admit: 2015-04-27 | Discharge: 2015-04-27 | Disposition: A | Discharge status: Home / Self Care | Payer: 59 | Attending: Internal Medicine | Admitting: Internal Medicine

## 2015-04-27 ENCOUNTER — Other Ambulatory Visit: Payer: Self-pay | Admitting: Internal Medicine

## 2015-04-27 ENCOUNTER — Encounter: Payer: Self-pay | Admitting: Internal Medicine

## 2015-04-27 VITALS — BP 122/80 | HR 87 | Temp 97.6°F | Resp 12 | Ht 63.5 in | Wt 247.2 lb

## 2015-04-27 DIAGNOSIS — R7303 Prediabetes: Secondary | ICD-10-CM

## 2015-04-27 DIAGNOSIS — M25552 Pain in left hip: Secondary | ICD-10-CM | POA: Diagnosis not present

## 2015-04-27 DIAGNOSIS — M545 Low back pain: Secondary | ICD-10-CM | POA: Diagnosis present

## 2015-04-27 DIAGNOSIS — M5442 Lumbago with sciatica, left side: Secondary | ICD-10-CM | POA: Diagnosis not present

## 2015-04-27 DIAGNOSIS — E785 Hyperlipidemia, unspecified: Secondary | ICD-10-CM

## 2015-04-27 DIAGNOSIS — N201 Calculus of ureter: Secondary | ICD-10-CM | POA: Insufficient documentation

## 2015-04-27 DIAGNOSIS — E669 Obesity, unspecified: Secondary | ICD-10-CM

## 2015-04-27 MED ORDER — ETODOLAC ER 600 MG PO TB24: 600.0000 mg | ORAL_TABLET | Freq: Every day | ORAL | Status: DC

## 2015-04-27 MED ORDER — PREDNISONE 10 MG PO TABS: ORAL_TABLET | ORAL | Status: DC

## 2015-04-27 MED ORDER — OMEPRAZOLE 40 MG PO CPDR: 40.0000 mg | DELAYED_RELEASE_CAPSULE | Freq: Every day | ORAL | Status: DC

## 2015-04-27 NOTE — Patient Instructions (Addendum)
Your   Plain films of back and left hip have been ordered   If the plain films are completely normal,  The issue may be complicated by IT band (ilio tibial band syndrome)   PT referral made   prednisone taper [facility] to [facility] for next episode   Return prior to next refill on phentermine needed    This is  my version of a  "Low GI"  Diet:     All of the foods can be found at grocery stores and in bulk at [employer].  The Atkins protein bars and shakes are available in more varieties at Target, WalMart and Lowe's Foods.     7 AM Breakfast:  Choose from the following:  Low carbohydrate Protein  Shakes (I recommend the Premier Oriteunb chocolate, the EAS AdvantEdge "Carb Control" shakes  Or the low carb shakes by Atkins.    2.5 carbs   Arnold's "Sandwhich Thin"toasted  w/ peanut butter (no jelly: about 20 net carbs  "Bagel Thin" with cream cheese and salmon: about 20 carbs   a scrambled egg/bacon/cheese burrito made with Mission's "carb balance" whole wheat tortilla  (about 10 net carbs )  A slice of home made fritatta (egg based dish without a crust:  google it)    Avoid cereal and bananas, oatmeal and cream of wheat and grits. They are loaded with carbohydrates!   10 AM: high protein snack  Protein bar by Atkins (the snack size, under 200 cal, usually < 6 net carbs).    A stick of cheese:  Around 1 carb,  100 cal     Dannon Light n Fit AustriaGreek Yogurt  (80 cal, 8 carbs)  Other so called "protein bars" and Greek yogurts tend to be loaded with carbohydrates.  Remember, in food advertising, the word "energy" is synonymous for " carbohydrate."  Lunch:   A Sandwich using the bread choices listed, Can use any  Eggs,  lunchmeat, grilled meat or canned tuna), avocado, regular mayo/mustard  and cheese.  A Salad using blue cheese, ranch,  Goddess or vinagrette,  No croutons or "confetti" and no "candied nuts" but regular nuts OK.   No pretzels or chips.  Pickles and miniature sweet peppers are  a good low carb alternative that provide a "crunch"  The bread is the only source of carbohydrate in a sandwich and  can be decreased by trying some of these alternatives to traditional loaf bread:  Joseph's makes a pita bread and a flat bread (called "Lavash") that are 50 cal and 4 net carbs available at BJs, [employer] and WalMart.  This can be toasted to use with hummous as well  Toufayan makes a low carb flatbread that's 100 cal and 9 net carbs available at [employer] and [employer]  Mission makes 2 sizes of  Low carb whole wheat tortilla  (The large one is 210 cal and 6 net carbs)  Flat Out makes flatbreads that are low carb as well  aa folded flatbread called "Foldit" available at [employer]   Avoid "Low fat dressings, as well as Reyne DumasCatalina and [address] dressings They are loaded with sugar.  Ranch,  Baxter InternationalBlue cheese, Goddess,  vinagrettes are all fine   3 PM/ Mid day  Snack:  Consider  1 ounce of  almonds, walnuts, pistachios, pecans, peanuts,  Macadamia nuts or a nut medley.  Avoid "granola"; the dried cranberries and raisins are loaded with carbohydrates. Mixed nuts as long as there are  no raisins,  cranberries or dried fruit.    Try the prosciutto/mozzarella cheese sticks by Fiorruci  In deli /backery section   High protein   To avoid overindulging in snacks: Try drinking a glass of almond coconut milk light by Silk ( Or a cup of coffee with your Atkins chocolate bar to keep you from having 3!!!   Pork rinds!  Yes Pork Rinds        6 PM  Dinner:     Meat/fowl/fish with a green salad, and either broccoli, cauliflower, green beans, spinach, brussel sprouts or  Lima beans. DO NOT BREAD THE PROTEIN!!      There is a low carb pasta by Dreamfield's that is acceptable and tastes great: only 5 digestible carbs/serving.( All grocery stores but BJs carry it )  There are frozen dinners that are low carb:  Try Michel Angelo's chicken piccata or chicken or eggplant parm over low carb pasta.(Lowes  and BJs)   Clifton Custard Sanchez's "Carnitas" (pulled pork, no sauce,  0 carbs) or his beef pot roast to make a dinner burrito (at BJ's)  Pesto over low carb pasta (bj's sells a good quality pesto in the center refrigerated section of the deli   Try satueeing  Roosvelt Harps with mushroooms  Whole wheat pasta is still full of digestible carbs and  Not as low in glycemic index as Dreamfield's.   Brown rice is still rice,  So skip the rice and noodles if you eat [place] or [place] (or at least limit to 1/2 cup)  9 PM snack :   Breyer's "low carb" fudgsicle or  ice cream bar (Carb Smart line), or  Weight Watcher's ice cream bar , or another "no sugar added" ice cream;  a serving of fresh berries/cherries with whipped cream   Cheese or DANNON'S LlGHT N FIT GREEK YOGURT or the Oikos greek yogurt   8 ounces of Blue Diamond unsweetened almond/cococunut milk  Cheese and crackers (using WASA crackers,  They are low carb) or peanut butter on low carb crackers or pita bread     Avoid bananas, pineapple, grapes  and watermelon on a regular basis because they are high in sugar.  THINK OF THEM AS DESSERT  Remember that snack Substitutions should be less than 10 NET carbs per serving and meals should be < 25 net carbs. Remember that carbohydrates from fiber do not affect blood sugar, so you can  subtract fiber grams to get the "net carbs " of any particular food item.

## 2015-04-27 NOTE — Progress Notes (Signed)
Subjective:  Patient ID: Lisa [name], female    DOB: [birth year]  Age: 39 y.o. MRN: [id number]  CC: The primary encounter diagnosis was Lateral pain of left hip. Diagnoses of Prediabetes, Hyperlipidemia, Left-sided low back pain with left-sided sciatica, and Obesity were also pertinent to this visit.  HPI Lisa [name] presents for new patient evaluation, referred by Dr Lisa [name].  She is a vascular lab tech .   1) Obesity with pre diabetes:  Prescribed phentermine by Lisa [name],  Which helped her lose weight .  But then she gained wt off off of phentermine,  Resumed it 5 days per week.days  And has lost ten lbs,in the last 45 days.  Walking 3-4  Days per week, 2-3 miles per day enrolled in the PILOT program for prediabetes  Most recent A1c was 5.8  Recurrent "sciatica" flare ups.  Now constant for the past 2 months left side radiates to the foot,.  Started during a period of inactivity during plantar fasciitis. No foot dragging.  Leg feels tired and week.  Hurts for the first 10 minutes of walk,  Then it gets better the longer she walks .  Took  Ibuprofen as needed,  took etodolac for plantar fasciitis  No prior imaging of lower back.  .pain in SI joint,  Down the lateral thigh and down to forefoot.  2 occasions of tingling in toes, .  All pain improves with heat and stretching.  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  Referred to Lisa [name],  ENT .  Attributed to omeprazole,  Hd one dilation   No prior mammogram,  No FH  FH of one grandparent with breast CA and  one with colon CA  Prediabetes:  Getting a1c down   Vasovagal episode  Occurred at work in Feb 2016,  Several at home , with tingling and arm weakness  Flushing  no syncope.  Saw Lisa [name],   ECHO and EKG normal   Last PAP normal 2015, no prior abnormals.  Nexplanon Nov 2014 Dr Lisa [name]  IUD removed the week prior   2 C/s  No complications .  Kids ok  61  and 95 , oldest is girl   History Haven has a past medical history of Complication of anesthesia; PONV (postoperative nausea and vomiting); Family history of anesthesia complication; Dysrhythmia; Shortness of breath; Bronchitis; GERD (gastroesophageal reflux disease); Headache(784.0); and Hypertension.   She has past surgical history that includes Cesarean section; widom extractions; Tongue flap release; and Microlaryngoscopy with laser and balloon dilation (N/A, 07/20/2012).   Her family history includes Arthritis in her mother; Hyperlipidemia in her father and mother; Hypertension in her father and mother.She reports that she has never smoked. She does not have any smokeless tobacco history on file. She reports that she drinks alcohol. She reports that she does not use illicit drugs.  Outpatient Prescriptions Prior to Visit  Medication Sig Dispense Refill  . cyanocobalamin (,VITAMIN B-12,) 1000 MCG/ML injection Inject 1 mL (1,000 mcg total) into the muscle every 30 (thirty) days. 3 mL 2  . etonogestrel (NEXPLANON) 68 MG IMPL implant 1 each by Subdermal route once.    . loratadine (CLARITIN) 10 MG tablet Take 10 mg by mouth daily.    . phentermine (ADIPEX-P) 37.5 MG tablet Take 1 tablet (37.5 mg total) by mouth daily before breakfast. 30 tablet 2  . etodolac (LODINE) 300 MG capsule Take  300 mg by mouth 2 (two) times daily.     No facility-administered medications prior to visit.    Review of Systems:  Patient denies headache, fevers, malaise, unintentional weight loss, skin rash, eye pain, sinus congestion and sinus pain, sore throat, dysphagia,  hemoptysis , cough, dyspnea, wheezing, chest pain, palpitations, orthopnea, edema, abdominal pain, nausea, melena, diarrhea, constipation, flank pain, dysuria, hematuria, urinary  Frequency, nocturia, numbness, tingling, seizures,  Focal weakness, Loss of consciousness,  Tremor, insomnia, depression, anxiety, and suicidal ideation.     Objective:    BP 122/80 mmHg  Pulse 87  Temp(Src) 97.6 F (36.4 C) (Oral)  Resp 12  Ht 5' 3.5" (1.613 m)  Wt 247 lb 4 oz (112.152 kg)  BMI 43.11 kg/m2  SpO2 98%  Physical Exam:  General appearance: alert, cooperative and appears stated age Ears: normal TM's and external ear canals both ears Throat: lips, mucosa, and tongue normal; teeth and gums normal Neck: no adenopathy, no carotid bruit, supple, symmetrical, trachea midline and thyroid not enlarged, symmetric, no tenderness/mass/nodules Back: symmetric, no curvature. ROM normal. No CVA tenderness. Lungs: clear to auscultation bilaterally Heart: regular rate and rhythm, S1, S2 normal, no murmur, click, rub or gallop Abdomen: soft, non-tender; bowel sounds normal; no masses,  no organomegaly Pulses: 2+ and symmetric Skin: Skin color, texture, turgor normal. No rashes or lesions Lymph nodes: Cervical, supraclavicular, and axillary nodes normal. MSK: negative straight leg lift , normal hip ROM left side.    Assessment & Plan:   Problem List Items Addressed This Visit    Left-sided low back pain with left-sided sciatica    Episodic,  currently improved. Plain films of lumbar spin and left hip failed to show any bony abnormalities or alignment issues. A 1.1 cm calcification was noted adjacent to the L3 transverse process which may indicate a vascular calcification or ureteral calc (per radiology).  Further imaging will be required to determine the source of her pain and the origin of the calcification.   Continue etodolac .PT referral   Prednisone taper for next episode      Relevant Medications   etodolac (LODINE XL) 600 MG 24 hr tablet   predniSONE (DELTASONE) 10 MG tablet   Other Relevant Orders   DG Lumbar Spine 2-3 Views (Completed)   Obesity    Other Visit Diagnoses    Lateral pain of left hip    -  Primary    Relevant Orders    Ambulatory referral to Physical Therapy    DG HIP UNILAT WITH PELVIS 1V LEFT    Prediabetes         Relevant Orders    Comprehensive metabolic panel    Hyperlipidemia        Relevant Orders    Lipid Panel With LDL/HDL Ratio       I have discontinued Ms. Lisa [name]'s etodolac. I have also changed her omeprazole. Additionally, I am having her start on etodolac and predniSONE. Lastly, I am having her maintain her etonogestrel, loratadine, phentermine, and cyanocobalamin.  Meds ordered this encounter  Medications  . DISCONTD: omeprazole (PRILOSEC) 40 MG capsule    Sig: Take 1 capsule by mouth daily.    Refill:  5  . etodolac (LODINE XL) 600 MG 24 hr tablet    Sig: Take 1 tablet (600 mg total) by mouth daily.    Dispense:  30 tablet    Refill:  2  . omeprazole (PRILOSEC) 40 MG capsule    Sig: Take 1 capsule (  40 mg total) by mouth daily.    Dispense:  90 capsule    Refill:  1  . predniSONE (DELTASONE) 10 MG tablet    Sig: 6 tablets on Day 1 , then reduce by 1 tablet daily until gone    Dispense:  21 tablet    Refill:  0    Medications Discontinued During This Encounter  Medication Reason  . etodolac (LODINE) 300 MG capsule   . omeprazole (PRILOSEC) 40 MG capsule Reorder  . omeprazole (PRILOSEC) 40 MG capsule Reorder   A total of 45 minutes was spent with patient more than half of which was spent in counseling patient on the above mentioned issues , reviewing and explaining recent labs and imaging studies done, and coordination of care.  Follow-up: Return in about 2 months (around 06/28/2015).   Sherlene Shams, MD

## 2015-04-27 NOTE — Progress Notes (Signed)
Pre-visit discussion using our clinic review tool. No additional management support is needed unless otherwise documented below in the visit note.  

## 2015-04-28 ENCOUNTER — Encounter: Payer: Self-pay | Admitting: Internal Medicine

## 2015-04-28 ENCOUNTER — Telehealth: Payer: Self-pay | Admitting: *Deleted

## 2015-04-28 DIAGNOSIS — E669 Obesity, unspecified: Secondary | ICD-10-CM

## 2015-04-28 DIAGNOSIS — M545 Low back pain, unspecified: Secondary | ICD-10-CM | POA: Insufficient documentation

## 2015-04-28 NOTE — Telephone Encounter (Signed)
Clarita LeberAngie Bass from [facility], requested that Dr. Darrick Huntsmanullo sign off on the Xray that was taken last night, and disregard the first Xray order. Dr. Sanda Lingerhomas Jones approved a new order requested. Please Advise

## 2015-04-28 NOTE — Telephone Encounter (Signed)
Please advise 

## 2015-04-28 NOTE — Assessment & Plan Note (Addendum)
Episodic,  currently improved. Plain films of lumbar spin and left hip failed to show any bony abnormalities or alignment issues. A 1.1 cm calcification was noted adjacent to the L3 transverse process which may indicate a vascular calcification or ureteral calc (per radiology).  Further imaging will be required to determine the source of her pain and the origin of the calcification.   Continue etodolac .PT referral   Prednisone taper for next episode

## 2015-04-29 ENCOUNTER — Other Ambulatory Visit: Payer: Self-pay | Admitting: Internal Medicine

## 2015-04-29 DIAGNOSIS — M5442 Lumbago with sciatica, left side: Secondary | ICD-10-CM

## 2015-04-29 DIAGNOSIS — R229 Localized swelling, mass and lump, unspecified: Secondary | ICD-10-CM

## 2015-05-01 ENCOUNTER — Other Ambulatory Visit: Payer: Self-pay

## 2015-05-01 ENCOUNTER — Ambulatory Visit: Payer: Self-pay | Admitting: Internal Medicine

## 2015-05-04 ENCOUNTER — Ambulatory Visit: Payer: 59 | Attending: Internal Medicine | Admitting: Physical Therapy

## 2015-05-04 DIAGNOSIS — M533 Sacrococcygeal disorders, not elsewhere classified: Secondary | ICD-10-CM | POA: Insufficient documentation

## 2015-05-04 DIAGNOSIS — M545 Low back pain: Secondary | ICD-10-CM | POA: Insufficient documentation

## 2015-05-04 DIAGNOSIS — M25552 Pain in left hip: Secondary | ICD-10-CM | POA: Insufficient documentation

## 2015-05-04 NOTE — Therapy (Signed)
[facility] MEDICINE [address] [place], [place], [address] Phone: [phone]   Fax:  [phone]  Physical Therapy Evaluation  Patient Details  Name: Charlann LangeMichelle K Hofmeister MRN: [id number] Date of Birth: 1975/09/25 Referring Provider: Darrick Huntsmanullo  Encounter Date: 05/04/2015      PT End of Session - 05/04/15 1117    Visit Number 1   Number of Visits 9   Date for PT Re-Evaluation 06/01/15   PT Start Time 1025   PT Stop Time 1105   PT Time Calculation (min) 40 min   Activity Tolerance Patient tolerated treatment well;No increased pain   Behavior During Therapy [facility] for tasks assessed/performed      Past Medical History  Diagnosis Date  . Complication of anesthesia   . PONV (postoperative nausea and vomiting)   . Family history of anesthesia complication     "morphine night terrors"  . Dysrhythmia     "does not see cardiologist"  . Shortness of breath     "wheezing"  . Bronchitis     hx of  . GERD (gastroesophageal reflux disease)   . Headache(784.0)     "migraines"  . Hypertension     Past Surgical History  Procedure Laterality Date  . Cesarean section      x 2  . Widom extractions    . Tongue flap release    . Microlaryngoscopy with laser and balloon dilation N/A 07/20/2012    Procedure: MICROLARYNGOSCOPY WITH LASER AND BALLOON DILATION;  Surgeon: Christia Readingwight Bates, MD;  [facility];  Service: ENT;  Laterality: N/A;  WITH JET VENTURI VENTILATION    There were no vitals filed for this visit.  Visit Diagnosis:  Left hip pain - Plan: PT plan of care cert/re-cert  SI (sacroiliac) pain - Plan: PT plan of care cert/re-cert      Subjective Assessment - 05/04/15 1107    Subjective Patient reports having left leg/hip pain for the last 3 months. SHe has had this pain in the past but is able to resolve it with massage.    Limitations Sitting   Diagnostic tests xrays negative   Patient Stated Goals to have decreased  pain   Currently in Pain? Yes   Pain Score 2    Pain Location Hip   Pain Orientation Left;Lateral   Pain Descriptors / Indicators Tender;Sore   Pain Type Chronic pain   Pain Onset More than a month ago   Pain Frequency Intermittent   Pain Relieving Factors heat, piriformis stretching, walking   Multiple Pain Sites No            [facility] PT Assessment - 05/04/15 0001    Assessment   Medical Diagnosis lateral pain left hip   Referring Provider Tullo   Onset Date/Surgical Date 02/13/13   Next MD Visit feb   Prior Therapy no   Precautions   Precautions None   Restrictions   Weight Bearing Restrictions No   Balance Screen   Has the patient fallen in the past 6 months No   Has the patient had a decrease in activity level because of a fear of falling?  No   Is the patient reluctant to leave their home because of a fear of falling?  No   Home Environment   Living Environment Private residence   Living Arrangements Spouse/significant other;Children   Home Layout Two level   Alternate Level Stairs-Number of Steps 15   Prior Function   Level  of Independence Independent   Vocation Full time employment   Cognition   Overall Cognitive Status Within Functional Limits for tasks assessed   Sensation   Light Touch Appears Intact   AROM   Overall AROM  Within functional limits for tasks performed   Strength   Overall Strength Within functional limits for tasks performed   Palpation   SI assessment  tender        Treatment this session to include STM of left hip and IT band as well as rolling using "the stick" for decreased pain and  Tissue mobilization.         PT Education - 05/04/15 1117    Education provided Yes   Education Details POC, continue with stretching, heat, walking as tolerated   Person(s) Educated Patient   Methods Explanation   Comprehension Verbalized understanding             PT Long Term Goals - 05/04/15 1121    PT LONG TERM GOAL #1   Title  Patient will report decreased pain with functional activities. <2/10   Baseline 2/10   Time 4   Period Weeks   Status New   PT LONG TERM GOAL #2   Title Patient will have improvement on Oswestry of < 26%   Baseline 26%   Time 4   Period Weeks   Status New               Plan - 05/04/15 1119    Clinical Impression Statement Patient is a 39 year old female who is reporting left hip pain and IT band pain on left. Patient reports improvement with walking, stretching and heat. Pt has tenderness over SI joint and tenderness over IT band on left with manual palpation.    Pt will benefit from skilled therapeutic intervention in order to improve on the following deficits Pain;Decreased activity tolerance   Rehab Potential Fair   PT Frequency 2x / week   PT Duration 4 weeks   PT Treatment/Interventions Moist Heat;Therapeutic exercise;Manual techniques;Dry needling;Electrical Stimulation   PT Next Visit Plan manual therapy, rolling, stretching   PT Home Exercise Plan continue with piriformis stretch, walking and heat   Consulted and Agree with Plan of Care Patient         Problem List Patient Active Problem List   Diagnosis Date Noted  . Left-sided low back pain with left-sided sciatica 04/28/2015  . Obesity 04/28/2015  . Pre-syncope 07/14/2014    Jaydence Arnesen, PT, MPT, GCS 05/04/2015, 11:27 AM  [facility] [address] [place], [address] Phone: [phone]   Fax:  [phone]  Name: IRELYNN SCHERMERHORN MRN: [id number] Date of Birth: 02-19-1976

## 2015-05-05 ENCOUNTER — Ambulatory Visit: Payer: 59

## 2015-05-06 ENCOUNTER — Ambulatory Visit: Payer: Self-pay | Admitting: Internal Medicine

## 2015-05-12 ENCOUNTER — Ambulatory Visit: Payer: 59 | Admitting: Physical Therapy

## 2015-05-12 DIAGNOSIS — M25552 Pain in left hip: Secondary | ICD-10-CM | POA: Diagnosis not present

## 2015-05-12 DIAGNOSIS — M545 Low back pain: Secondary | ICD-10-CM

## 2015-05-12 NOTE — Therapy (Signed)
[facility]TS MEDICINE [address] , [place], [address] Phone: [phone]   Fax:  [phone]  Physical Therapy Treatment  Patient Details  Name: Lisa [name] MRN: [id number] Date of Birth: Jan 09, 1976 Referring Provider: Darrick Huntsmanullo  Encounter Date: 05/12/2015      PT End of Session - 05/12/15 1044    Visit Number 2   Date for PT Re-Evaluation 06/01/15   PT Start Time 0956   PT Stop Time 1045   PT Time Calculation (min) 49 min   Activity Tolerance Patient tolerated treatment well;No increased pain   Behavior During Therapy [facility] for tasks assessed/performed      Past Medical History  Diagnosis Date  . Complication of anesthesia   . PONV (postoperative nausea and vomiting)   . Family history of anesthesia complication     "morphine night terrors"  . Dysrhythmia     "does not see cardiologist"  . Shortness of breath     "wheezing"  . Bronchitis     hx of  . GERD (gastroesophageal reflux disease)   . Headache(784.0)     "migraines"  . Hypertension     Past Surgical History  Procedure Laterality Date  . Cesarean section      x 2  . Widom extractions    . Tongue flap release    . Microlaryngoscopy with laser and balloon dilation N/A 07/20/2012    Procedure: MICROLARYNGOSCOPY WITH LASER AND BALLOON DILATION;  Surgeon: Christia Readingwight Bates, MD;  [facility];  Service: ENT;  Laterality: N/A;  WITH JET VENTURI VENTILATION    There were no vitals filed for this visit.  Visit Diagnosis:  Left hip pain  Midline low back pain, with sciatica presence unspecified      Subjective Assessment - 05/12/15 0958    Subjective Pt re-explained her pain. reports she is continuing to have pain which has incr. over the past day. She is continuing to take her anti-inflammatory medications which does seem to help.   Currently in Pain? Yes   Pain Score 4    Pain Location Hip   Pain Orientation Left         Objective: CPAs grade III 3x30 oscillations L1-L4. Following this pt reports 0/10 pain in hip.  L hip IR manual stretch 3x1 min hold. ROM improved by 15 degr. Following this.  Lumbar rotation following manual tx, L=R (initially L lacked 25 degr.)  Performed rotation 3x20 with 3 sec. Holds at end range.  hooklying knee flops with feet apart to work on hip IR ROM, 3x10 with 5 sec. Holds at end range.  Standing posterior pelvic tilt with cuing to bend knees, breathe normally, and perform 2# shoulder motions to challenge trunk control. Pt found this very challenging and required considerable cuing to perform.                         PT Education - 05/12/15 1044    Education provided Yes   Education Details exercises for back/hip.   Person(s) Educated Patient   Methods Explanation   Comprehension Verbalized understanding             PT Long Term Goals - 05/04/15 1121    PT LONG TERM GOAL #1   Title Patient will report decreased pain with functional activities. <2/10   Baseline 2/10   Time 4   Period Weeks   Status New   PT LONG TERM  GOAL #2   Title Patient will have improvement on Oswestry of < 26%   Baseline 26%   Time 4   Period Weeks   Status New               Plan - 05/12/15 1045    Clinical Impression Statement significant lumbar lordosis, general laxity/hypermobility throughout joints except L1-L3 which produce pain and are hypomobile. Pt also lacks hip IR on L which reproduces her pain. Pt would benefit from continued stretching of hip as well as HEP to address poor trunk control. and currently (+) slump stretch on L.   Pt will benefit from skilled therapeutic intervention in order to improve on the following deficits Pain;Decreased activity tolerance   Rehab Potential Fair   PT Frequency 2x / week   PT Duration 4 weeks   PT Treatment/Interventions Moist Heat;Therapeutic exercise;Manual techniques;Dry needling;Electrical  Stimulation   PT Next Visit Plan manual therapy, rolling, stretching   PT Home Exercise Plan continue with piriformis stretch, walking and heat   Consulted and Agree with Plan of Care Patient        Problem List Patient Active Problem List   Diagnosis Date Noted  . Left-sided low back pain with left-sided sciatica 04/28/2015  . Obesity 04/28/2015  . Pre-syncope 07/14/2014    Janathan Bribiesca PT DPT 05/12/2015, 10:47 AM  [facility] SPORTS MEDICINE [address], [place], [address] Phone: [phone]   Fax:  [phone]  Name: Lisa [name] MRN: [id number] Date of Birth: Jul 10, 1975

## 2015-05-14 ENCOUNTER — Other Ambulatory Visit (INDEPENDENT_AMBULATORY_CARE_PROVIDER_SITE_OTHER): Payer: 59

## 2015-05-14 ENCOUNTER — Ambulatory Visit: Payer: 59 | Admitting: Physical Therapy

## 2015-05-14 DIAGNOSIS — M25552 Pain in left hip: Secondary | ICD-10-CM

## 2015-05-14 DIAGNOSIS — M545 Low back pain: Secondary | ICD-10-CM

## 2015-05-14 DIAGNOSIS — E785 Hyperlipidemia, unspecified: Secondary | ICD-10-CM | POA: Diagnosis not present

## 2015-05-14 DIAGNOSIS — M533 Sacrococcygeal disorders, not elsewhere classified: Secondary | ICD-10-CM

## 2015-05-14 DIAGNOSIS — R7303 Prediabetes: Secondary | ICD-10-CM | POA: Diagnosis not present

## 2015-05-14 LAB — COMPREHENSIVE METABOLIC PANEL
ALBUMIN: 4.2 g/dL (ref 3.5–5.2)
ALK PHOS: 65 U/L (ref 39–117)
ALT: 24 U/L (ref 0–35)
AST: 16 U/L (ref 0–37)
BILIRUBIN TOTAL: 0.7 mg/dL (ref 0.2–1.2)
BUN: 15 mg/dL (ref 6–23)
CO2: 26 mEq/L (ref 19–32)
Calcium: 9.5 mg/dL (ref 8.4–10.5)
Chloride: 105 mEq/L (ref 96–112)
Creatinine, Ser: 0.72 mg/dL (ref 0.40–1.20)
GFR: 96.17 mL/min (ref >60.00)
Glucose, Bld: 103 mg/dL — ABNORMAL HIGH (ref 70–99)
POTASSIUM: 4.4 meq/L (ref 3.5–5.1)
Sodium: 139 mEq/L (ref 135–145)
TOTAL PROTEIN: 6.7 g/dL (ref 6.0–8.3)

## 2015-05-14 NOTE — Therapy (Signed)
[facility] PHYS[facility] MEDICINE [address] [place], [address] Phone: [phone]   Fax:  [phone]  Physical Therapy Treatment  Patient Details  Name: Lisa [name] MRN: [id number] Date of Birth: 1975-06-28 Referring Provider: Darrick Huntsman  Encounter Date: 05/14/2015      PT End of Session - 05/14/15 1041    Visit Number 3   Number of Visits 9   Date for PT Re-Evaluation 06/01/15   PT Start Time 0955   PT Stop Time 1035   PT Time Calculation (min) 40 min   Activity Tolerance Patient tolerated treatment well;No increased pain   Behavior During Therapy [facility] for tasks assessed/performed      Past Medical History  Diagnosis Date  . Complication of anesthesia   . PONV (postoperative nausea and vomiting)   . Family history of anesthesia complication     "morphine night terrors"  . Dysrhythmia     "does not see cardiologist"  . Shortness of breath     "wheezing"  . Bronchitis     hx of  . GERD (gastroesophageal reflux disease)   . Headache(784.0)     "migraines"  . Hypertension     Past Surgical History  Procedure Laterality Date  . Cesarean section      x 2  . Widom extractions    . Tongue flap release    . Microlaryngoscopy with laser and balloon dilation N/A 07/20/2012    Procedure: MICROLARYNGOSCOPY WITH LASER AND BALLOON DILATION;  Surgeon: Christia Reading, MD;  Location: [facility] OR;  Service: ENT;  Laterality: N/A;  WITH JET VENTURI VENTILATION    There were no vitals filed for this visit.  Visit Diagnosis:  Left hip pain  Midline low back pain, with sciatica presence unspecified  SI (sacroiliac) pain      Subjective Assessment - 05/14/15 1039    Subjective Patient reports soreness, but states "it's not too bad today."   Limitations Sitting   Diagnostic tests xrays negative   Patient Stated Goals to have decreased pain   Currently in Pain? Yes   Pain Score 3    Pain Location Hip   Pain Orientation  Left   Pain Descriptors / Indicators Tender;Sore   Pain Type Chronic pain   Pain Onset More than a month ago   Pain Frequency Intermittent   Multiple Pain Sites No             Objective:  STM to left hip, glute med and lateral thigh manually and with "the stick" roller for pain and tight tissue release.   CPAs grade III 3x30 oscillations L1-L4. Reports tenderness over entire area.   L hip IR manual stretch 3x1 min hold. ROM improved following this. Left hip distraction x 3 bouts.   Lumbar rotation following manual tx, L=R x 1 min  hooklying lumbar rotation with feet apart to work on hip IR ROM, x 2 min with 5 sec. Holds at end range. Reports stretch felt.   Reviewed HEP of standing posterior pelvic tilt with cuing to bend knees, breathe normally, and perform 2# shoulder motions to challenge trunk control. Reports core muscle soreness with practicing this.           PT Education - 05/14/15 1041    Education provided Yes   Education Details reviewed exercises, HEP   Person(s) Educated Patient   Methods Explanation   Comprehension Verbalized understanding;Returned demonstration  PT Long Term Goals - 05/04/15 1121    PT LONG TERM GOAL #1   Title Patient will report decreased pain with functional activities. <2/10   Baseline 2/10   Time 4   Period Weeks   Status New   PT LONG TERM GOAL #2   Title Patient will have improvement on Oswestry of < 26%   Baseline 26%   Time 4   Period Weeks   Status New               Problem List Patient Active Problem List   Diagnosis Date Noted  . Left-sided low back pain with left-sided sciatica 04/28/2015  . Obesity 04/28/2015  . Pre-syncope 07/14/2014    Anias Bartol, PT, MPT, GCS 05/14/2015, 11:51 AM  [facility] [address] [place], [place], [address] Phone: [phone]   Fax:  [phone]  Name: Lisa [name] MRN:  [id number] Date of Birth: [birth year]

## 2015-05-15 LAB — LIPID PANEL WITH LDL/HDL RATIO
CHOLESTEROL TOTAL: 181 mg/dL (ref 100–199)
HDL: 38 mg/dL — AB (ref >39)
LDL Calculated: 103 mg/dL — ABNORMAL HIGH (ref 0–99)
LDl/HDL Ratio: 2.7 ratio units (ref 0.0–3.2)
TRIGLYCERIDES: 198 mg/dL — AB (ref 0–149)
VLDL Cholesterol Cal: 40 mg/dL (ref 5–40)

## 2015-05-18 ENCOUNTER — Telehealth: Payer: Self-pay | Admitting: Internal Medicine

## 2015-05-18 NOTE — Telephone Encounter (Signed)
My Chart message sent

## 2015-05-19 ENCOUNTER — Encounter: Payer: 59 | Admitting: Physical Therapy

## 2015-05-21 ENCOUNTER — Ambulatory Visit: Payer: 59 | Attending: Internal Medicine | Admitting: Physical Therapy

## 2015-05-21 DIAGNOSIS — M6281 Muscle weakness (generalized): Secondary | ICD-10-CM

## 2015-05-21 DIAGNOSIS — M25552 Pain in left hip: Secondary | ICD-10-CM

## 2015-05-21 NOTE — Therapy (Signed)
[facility] [address] [place], [address] Phone: [phone]   Fax:  [phone]  Physical Therapy Treatment  Patient Details  Name: Lisa [name] MRN: [id number] Date of Birth: 1975/09/26 Referring Provider: Darrick Huntsman  Encounter Date: 05/21/2015      PT End of Session - 05/21/15 1105    Visit Number 4   Number of Visits 9   Date for PT Re-Evaluation 06/01/15   PT Start Time 0930   PT Stop Time 1010   PT Time Calculation (min) 40 min   Activity Tolerance Patient tolerated treatment well;No increased pain   Behavior During Therapy [facility] for tasks assessed/performed      Past Medical History  Diagnosis Date  . Complication of anesthesia   . PONV (postoperative nausea and vomiting)   . Family history of anesthesia complication     "morphine night terrors"  . Dysrhythmia     "does not see cardiologist"  . Shortness of breath     "wheezing"  . Bronchitis     hx of  . GERD (gastroesophageal reflux disease)   . Headache(784.0)     "migraines"  . Hypertension     Past Surgical History  Procedure Laterality Date  . Cesarean section      x 2  . Widom extractions    . Tongue flap release    . Microlaryngoscopy with laser and balloon dilation N/A 07/20/2012    Procedure: MICROLARYNGOSCOPY WITH LASER AND BALLOON DILATION;  Surgeon: Christia Reading, MD;  Location: [facility] OR;  Service: ENT;  Laterality: N/A;  WITH JET VENTURI VENTILATION    There were no vitals filed for this visit.  Visit Diagnosis:  Left hip pain  Muscle weakness      Subjective Assessment - 05/21/15 1101    Subjective Pt reports her pain level has incr., she thinks it is due to her doing more stretching.   Limitations Sitting   Diagnostic tests xrays negative   Patient Stated Goals to have decreased pain   Currently in Pain? Yes   Pain Score 5    Pain Location Hip   Pain Orientation Left   Pain Onset More than a month ago                  Objective: Extensive STM performed in L SL to lateral proximal gastroc, vastus lateralis, TFL, glute/piriformis complex, QL muscle.  Following this pain improved to 0/10.  QL doorway stretch 3x30 sec. On L, extensive cuing in order to achieve stretch. Pt reported this "feels like it's stretching my pain" indicating this may be an effective stretch.  Doorway wall sits, 3x10 sec, pt extremely fatigued with performance of this. Cuing for neutral posture, heels down. Very challenging for pt, likely related to pain originating from lumbar spine due to weak LE.                PT Education - 05/21/15 1104    Education provided Yes   Education Details explained the importance of leg strengthening   Person(s) Educated Patient   Methods Explanation   Comprehension Verbalized understanding;Returned demonstration             PT Long Term Goals - 05/04/15 1121    PT LONG TERM GOAL #1   Title Patient will report decreased pain with functional activities. <2/10   Baseline 2/10   Time 4   Period Weeks   Status New   PT  LONG TERM GOAL #2   Title Patient will have improvement on Oswestry of < 26%   Baseline 26%   Time 4   Period Weeks   Status New               Plan - 05/21/15 1105    Clinical Impression Statement Pt made definite improvement in pain with manual tx today, and demonstrates significant LE weakness which appears to be contributing to pt pain. Responded well to QL stretch.   Pt will benefit from skilled therapeutic intervention in order to improve on the following deficits Pain;Decreased activity tolerance   Rehab Potential Fair   PT Frequency 2x / week   PT Duration 4 weeks   PT Treatment/Interventions Moist Heat;Therapeutic exercise;Manual techniques;Dry needling;Electrical Stimulation   PT Next Visit Plan manual therapy, rolling, stretching   PT Home Exercise Plan continue with piriformis stretch, walking and heat   Consulted  and Agree with Plan of Care Patient        Problem List Patient Active Problem List   Diagnosis Date Noted  . Left-sided low back pain with left-sided sciatica 04/28/2015  . Obesity 04/28/2015  . Pre-syncope 07/14/2014    Lisa [name] PT DPT 05/21/2015, 11:09 AM  [facility] [address] [place], [place], [address] Phone: [phone]   Fax:  [phone]  Name: Lisa [name] MRN: [id number] Date of Birth: [birth year]

## 2015-05-25 ENCOUNTER — Ambulatory Visit: Payer: 59 | Admitting: Physical Therapy

## 2015-05-26 ENCOUNTER — Encounter: Payer: 59 | Admitting: Physical Therapy

## 2015-05-27 ENCOUNTER — Ambulatory Visit: Payer: 59 | Admitting: Physical Therapy

## 2015-05-27 ENCOUNTER — Encounter: Payer: Self-pay | Admitting: Physical Therapy

## 2015-05-27 DIAGNOSIS — M25552 Pain in left hip: Secondary | ICD-10-CM

## 2015-05-27 DIAGNOSIS — M6281 Muscle weakness (generalized): Secondary | ICD-10-CM

## 2015-05-27 NOTE — Therapy (Signed)
[facility]IC[facility] [address] [place], [place], [address] Phone: [phone]   Fax:  [phone]  Physical Therapy Treatment  Patient Details  Name: Lisa [name] MRN: [id number] Date of Birth: [birth year] Referring Provider: Darrick Huntsmanullo  Encounter Date: 05/27/2015      PT End of Session - 05/27/15 1053    Visit Number 5   Number of Visits 9   Date for PT Re-Evaluation 06/01/15   PT Start Time 0945   PT Stop Time 1030   PT Time Calculation (min) 45 min   Activity Tolerance Patient tolerated treatment well;No increased pain   Behavior [facility] Therapy [facility] for tasks assessed/performed      Past Medical History  Diagnosis Date  . Complication of anesthesia   . PONV (postoperative nausea and vomiting)   . Family history of anesthesia complication     "morphine night terrors"  . Dysrhythmia     "does not see cardiologist"  . Shortness of breath     "wheezing"  . Bronchitis     hx of  . GERD (gastroesophageal reflux disease)   . Headache(784.0)     "migraines"  . Hypertension     Past Surgical History  Procedure Laterality Date  . Cesarean section      x 2  . Widom extractions    . Tongue flap release    . Microlaryngoscopy with laser and balloon dilation N/A 07/20/2012    Procedure: MICROLARYNGOSCOPY WITH LASER AND BALLOON DILATION;  Surgeon: Christia Readingwight Bates, MD;  [facility];  Service: ENT;  Laterality: N/A;  WITH JET VENTURI VENTILATION    There were no vitals filed for this visit.  Visit Diagnosis:  Muscle weakness  Left hip pain      Subjective Assessment - 05/27/15 1046    Subjective Pt reports pain in her L hip and back with occassional pain in the L knee but nothing that would limit her from performing her ADL's.  She also stats that she was able to perform a wall sit for 20" which is a 10" improvement from the prevous visit.   Limitations Sitting   Diagnostic tests xrays negative   Patient  Stated Goals to have decreased pain   Currently in Pain? Yes   Pain Score 3    Pain Location Hip   Pain Orientation Left;Lateral   Pain Onset More than a month ago   Multiple Pain Sites No           Objective:  Pt performed 3x20 squats on total gym to incr strength in B LE.  Cuing needed to keep pt from hyperextending knees during concentric phase of exercise in order to keep quads engaged throughout the motion.  She initially c/o pain in the lateral L hip, thigh, and knee but decr after the first set.  3x10 B knee ext with 25# to incr strength in B quadriceps.  D/c due to fatigue.  3x10 bridging on table to incr strength in B hamstrings and glutes.  During third set, pt was cued to engage glutes more and raise hips higher.  Pt was noticeably fatigued after exercises indicating further need for LE strengthening.                      PT Education - 05/27/15 1050    Education provided Yes   Education Details Pt was educated in new HEP and was told to expect soreness for no longer  than the next two days due to performing exercises.  PT also explained the importance of increasing strength in her legs so that tension and pressure would be taken off the joints.   Person(s) Educated Patient   Methods Explanation;Demonstration;Verbal cues   Comprehension Verbalized understanding;Returned demonstration             PT Long Term Goals - 05/04/15 1121    PT LONG TERM GOAL #1   Title Patient will report decreased pain with functional activities. <2/10   Baseline 2/10   Time 4   Period Weeks   Status New   PT LONG TERM GOAL #2   Title Patient will have improvement on Oswestry of < 26%   Baseline 26%   Time 4   Period Weeks   Status New               Plan - 05/27/15 1055    Clinical Impression Statement Pt's pain level saw improvement with exercises througout the session.  Pain level decreased while muscle fatigue increased indicating marked muscle weakness  in her legs.  Pt responded well to exercises and STM stating she felt much better at the end of the treatment session.    Pt will benefit from skilled therapeutic intervention in order to improve on the following deficits Pain;Decreased activity tolerance   Rehab Potential Fair   PT Frequency 2x / week   PT Duration 4 weeks   PT Treatment/Interventions Moist Heat;Therapeutic exercise;Manual techniques;Dry needling;Electrical Stimulation   PT Next Visit Plan LE strengthening, manual therapy, STM   PT Home Exercise Plan Bridging, wall sits, leg ext on side of bed   Consulted and Agree with Plan of Care Patient        Problem List Patient Active Problem List   Diagnosis Date Noted  . Left-sided low back pain with left-sided sciatica 04/28/2015  . Obesity 04/28/2015  . Pre-syncope 07/14/2014    Deven Audi PT DPT 05/27/2015, 11:01 AM  [facility] PHYSICAL AND SPORTS MEDICINE [address] [place], [address] Phone: [phone]   Fax:  [phone]  Name: Lisa [name] MRN: [id number] Date of Birth: Sep 29, 1975

## 2015-05-28 ENCOUNTER — Encounter: Payer: 59 | Admitting: Physical Therapy

## 2015-06-01 ENCOUNTER — Ambulatory Visit: Payer: 59 | Admitting: Physical Therapy

## 2015-06-01 ENCOUNTER — Encounter: Payer: Self-pay | Admitting: Physical Therapy

## 2015-06-01 DIAGNOSIS — M25552 Pain in left hip: Secondary | ICD-10-CM | POA: Diagnosis not present

## 2015-06-01 DIAGNOSIS — M6281 Muscle weakness (generalized): Secondary | ICD-10-CM | POA: Diagnosis not present

## 2015-06-01 NOTE — Therapy (Signed)
[facility] [address] [place], [place], [address] Phone: [phone]   Fax:  [phone]  Physical Therapy Treatment  Patient Details  Name: Lisa [name] MRN: [id number] Date of Birth: 22-Nov-1975 Referring Provider: Darrick Huntsmanullo  Encounter Date: 06/01/2015      PT End of Session - 06/01/15 1047    Visit Number 6   Number of Visits 9   Date for PT Re-Evaluation 06/01/15   PT Start Time 0945   PT Stop Time 1042   PT Time Calculation (min) 57 min   Activity Tolerance Patient tolerated treatment well;No increased pain   Behavior During Therapy [facility] for tasks assessed/performed      Past Medical History  Diagnosis Date  . Complication of anesthesia   . PONV (postoperative nausea and vomiting)   . Family history of anesthesia complication     "morphine night terrors"  . Dysrhythmia     "does not see cardiologist"  . Shortness of breath     "wheezing"  . Bronchitis     hx of  . GERD (gastroesophageal reflux disease)   . Headache(784.0)     "migraines"  . Hypertension     Past Surgical History  Procedure Laterality Date  . Cesarean section      x 2  . Widom extractions    . Tongue flap release    . Microlaryngoscopy with laser and balloon dilation N/A 07/20/2012    Procedure: MICROLARYNGOSCOPY WITH LASER AND BALLOON DILATION;  Surgeon: Christia Readingwight Bates, MD;  [facility];  Service: ENT;  Laterality: N/A;  WITH JET VENTURI VENTILATION    There were no vitals filed for this visit.  Visit Diagnosis:  Left hip pain  Muscle weakness      Subjective Assessment - 06/01/15 0945    Subjective Pt reports being sore after last visit.  She states that while taking things up to the attic her husband noticed that she ws not using her L leg to go up the steps.  When attempting to use her L leg she notices she could not go up the steps.  She also states that as she became more aware of it over the weekend, she  noticed that she only used her R leg to step up on curbs.   Limitations Sitting   Diagnostic tests xrays negative   Patient Stated Goals to have decreased pain   Currently in Pain? Yes   Pain Score 3    Pain Location Knee   Pain Orientation Left   Pain Onset More than a month ago   Pain Frequency Intermittent   Multiple Pain Sites No       Objective:  Nu-Step 5 min for warm up 3x15 leg press on total gym to incr strength in B LE.  Pt was cued to not hyper ext legs. 2x10 step ups on 10 in step L leg to incr strength in L LE.  PT was cued to slow down and control the eccentric phase of the exercise along with keeping her hip levels throughout the exercise.  1x10 steps ups on 8 in step to incr L LE strength and implement better control and quality during exercise. 3x10 B LE bridging to incr strength in LE. 7 min STM to lat L quad to decr pain and tenderness in L LE 3x30 sec L quad stretch in prone to incr mobility in L LE  PT Education - 06/01/15 1017    Education provided Yes   Education Details Pt was educated in new HEP and was told to expect some soreness in the next couple of days.  Pt was also told to make sure to use her L LE during the day and not compenstate with the R LE.   Person(s) Educated Patient   Methods Explanation;Demonstration;Verbal cues   Comprehension Verbalized understanding;Returned demonstration             PT Long Term Goals - 05/04/15 1121    PT LONG TERM GOAL #1   Title Patient will report decreased pain with functional activities. <2/10   Baseline 2/10   Time 4   Period Weeks   Status New   PT LONG TERM GOAL #2   Title Patient will have improvement on Oswestry of < 26%   Baseline 26%   Time 4   Period Weeks   Status New               Plan - 06/01/15 1019    Clinical Impression Statement Pt tolerated treatment well today even though some soreness/pain was experienced during the  session.  She still experiences fatigue and difficulty with some exercise but overall is doing much better with them.  She responded well to [facility] and L quad stretch.   Pt will benefit from skilled therapeutic intervention in order to improve on the following deficits Pain;Decreased activity tolerance   Rehab Potential Fair   PT Frequency 2x / week   PT Duration 4 weeks   PT Treatment/Interventions Moist Heat;Therapeutic exercise;Manual techniques;Dry needling;Electrical Stimulation   PT Next Visit Plan LE strengthening, manual therapy, STM   PT Home Exercise Plan Bridging, wall sits, step ups   Consulted and Agree with Plan of Care Patient        Problem List Patient Active Problem List   Diagnosis Date Noted  . Left-sided low back pain with left-sided sciatica 04/28/2015  . Obesity 04/28/2015  . Pre-syncope 07/14/2014    Fisher,Benjamin PT, DPT 06/01/2015, 10:50 AM  [place] [facility] PHYSICAL AND SPORTS MEDICINE [address] [place], [address] Phone: [phone]   Fax:  [phone]  Name: Lisa [name] MRN: [id number] Date of Birth: 11-Nov-1975

## 2015-06-02 ENCOUNTER — Encounter: Payer: 59 | Admitting: Physical Therapy

## 2015-06-03 ENCOUNTER — Ambulatory Visit: Payer: 59 | Admitting: Obstetrics and Gynecology

## 2015-06-03 ENCOUNTER — Encounter: Payer: Self-pay | Admitting: Physical Therapy

## 2015-06-03 ENCOUNTER — Ambulatory Visit: Payer: 59 | Admitting: Physical Therapy

## 2015-06-03 DIAGNOSIS — M6281 Muscle weakness (generalized): Secondary | ICD-10-CM | POA: Diagnosis not present

## 2015-06-03 DIAGNOSIS — M25552 Pain in left hip: Secondary | ICD-10-CM

## 2015-06-04 ENCOUNTER — Encounter: Payer: 59 | Admitting: Physical Therapy

## 2015-06-04 NOTE — Therapy (Signed)
[place] [facility] MEDIC[facility] [address] [place], [address] Phone: [phone]   Fax:  [phone]  Physical Therapy Treatment  Patient Details  Name: Lisa [name] MRN: [id number] Date of Birth: 08-27-1975 Referring Provider: Darrick Huntsman  Encounter Date: 06/03/2015      PT End of Session - 06/03/15 1011    Visit Number 7   Number of Visits 9   Date for PT Re-Evaluation 06/01/15   PT Start Time 0910   PT Stop Time 1005   PT Time Calculation (min) 55 min   Activity Tolerance Patient tolerated treatment well;No increased pain   Behavior During Therapy [facility] for tasks assessed/performed      Past Medical History  Diagnosis Date  . Complication of anesthesia   . PONV (postoperative nausea and vomiting)   . Family history of anesthesia complication     "morphine night terrors"  . Dysrhythmia     "does not see cardiologist"  . Shortness of breath     "wheezing"  . Bronchitis     hx of  . GERD (gastroesophageal reflux disease)   . Headache(784.0)     "migraines"  . Hypertension     Past Surgical History  Procedure Laterality Date  . Cesarean section      x 2  . Widom extractions    . Tongue flap release    . Microlaryngoscopy with laser and balloon dilation N/A 07/20/2012    Procedure: MICROLARYNGOSCOPY WITH LASER AND BALLOON DILATION;  Surgeon: Christia Reading, MD;  Location: [facility] OR;  Service: ENT;  Laterality: N/A;  WITH JET VENTURI VENTILATION    There were no vitals filed for this visit.  Visit Diagnosis:  Muscle weakness - Plan: PT plan of care cert/re-cert  Left hip pain - Plan: PT plan of care cert/re-cert      Subjective Assessment - 06/03/15 0904    Subjective Pt reports pain is "a lot better" today although stating that yesterday the pain was "real bad".  She reports it may have more to do with how active she was yesterday.   Limitations Sitting   Diagnostic tests xrays negative   Patient Stated  Goals to have decreased pain   Currently in Pain? No/denies   Pain Onset More than a month ago   Multiple Pain Sites No            Objective:  Dry needling - three needles in vastus lateralis with the most intense being the most proximal needle.  One needle in the proximal calf with it being the most intense of all needles.  Pt reported feeling sore after needling. Observation of pt in her lead vest and apron to assess and correct mechanics of moving during her work activities. 3x10 leg press level 18 to incr strength in B LE. 3x10 each leg unilat leg raises on red ball to incr core strength and stabilization 5 min STM to lat gastroc to decr pain and soreness and incr mobility. Overall pt did very well in session - focus of session on improving pain rather than focus on strengthening exclusively which pt responded well to.                    PT Education - 06/03/15 0926    Education provided Yes   Education Details Pt was educated on new HEP and proper body mechanics while wearing her lead vest and apron during her work activities.   Person(s) Educated  Patient   Methods Explanation;Demonstration;Verbal cues   Comprehension Verbalized understanding;Returned demonstration             PT Long Term Goals - 06/03/15 1012    PT LONG TERM GOAL #1   Title Patient will report decreased pain with functional activities. <2/10   Baseline 2/10   Time 4   Period Weeks   Status Achieved   PT LONG TERM GOAL #2   Title Patient will have improvement on Oswestry of < 26%   Baseline 26%   Time 4   Period Weeks   Status Deferred   PT LONG TERM GOAL #3   Title Pt will be able to perform 20 wall squats independently to possess the strength neessary for ADLs   Baseline 5   Time 4   Period Weeks   Status New   PT LONG TERM GOAL #4   Title Pt will work entire shift with <3/10 pain by the end of the shift.   Baseline 6/10   Time 4   Period Weeks   Status New                Plan - 06/03/15 1610    Clinical Impression Statement Pt came in reporting soreness the day before but fully participated in the therapy session.  Pt continues to have pain and discomfort during her work day and at night.  She is now able to perform short periods of exercise and can modify her positions at work to manage pain based on education from PT.  Pt would continue to benefit from strengthening exercises to incr strength in her B LE and decr pain.     Pt will benefit from skilled therapeutic intervention in order to improve on the following deficits Pain;Decreased activity tolerance   Rehab Potential Fair   PT Frequency 2x / week   PT Duration 4 weeks   PT Treatment/Interventions Moist Heat;Therapeutic exercise;Manual techniques;Dry needling;Electrical Stimulation   PT Next Visit Plan LE strengthening, manual therapy, STM   PT Home Exercise Plan Bridging, wall sits, step ups   Consulted and Agree with Plan of Care Patient        Problem List Patient Active Problem List   Diagnosis Date Noted  . Left-sided low back pain with left-sided sciatica 04/28/2015  . Obesity 04/28/2015  . Pre-syncope 07/14/2014    Estiben Mizuno PT DPT 06/04/2015, 2:03 PM  [place] [facility] PHYSICAL AND SPORTS MEDICINE [address] [place], [address] Phone: [phone]   Fax:  [phone]  Name: Lisa [name] MRN: [id number] Date of Birth: January 22, 1976

## 2015-06-08 ENCOUNTER — Encounter: Payer: Self-pay | Admitting: Physical Therapy

## 2015-06-08 ENCOUNTER — Ambulatory Visit: Payer: 59 | Admitting: Physical Therapy

## 2015-06-08 ENCOUNTER — Encounter: Payer: 59 | Admitting: Physical Therapy

## 2015-06-08 DIAGNOSIS — M6281 Muscle weakness (generalized): Secondary | ICD-10-CM

## 2015-06-08 DIAGNOSIS — M25552 Pain in left hip: Secondary | ICD-10-CM

## 2015-06-08 NOTE — Therapy (Signed)
[facility]INE [address], [place], [address] Phone: [phone]   Fax:  [phone]  Physical Therapy Treatment  Patient Details  Name: Lisa [name] MRN: [id number] Date of Birth: [birth year] Referring Provider: Darrick Huntsman  Encounter Date: 06/08/2015      PT End of Session - 06/08/15 1011    Visit Number 8   Number of Visits 9   Date for PT Re-Evaluation 06/01/15   PT Start Time 0900   PT Stop Time 0945   PT Time Calculation (min) 45 min   Activity Tolerance Patient tolerated treatment well;No increased pain   Behavior During Therapy [facility] for tasks assessed/performed      Past Medical History  Diagnosis Date  . Complication of anesthesia   . PONV (postoperative nausea and vomiting)   . Family history of anesthesia complication     "morphine night terrors"  . Dysrhythmia     "does not see cardiologist"  . Shortness of breath     "wheezing"  . Bronchitis     hx of  . GERD (gastroesophageal reflux disease)   . Headache(784.0)     "migraines"  . Hypertension     Past Surgical History  Procedure Laterality Date  . Cesarean section      x 2  . Widom extractions    . Tongue flap release    . Microlaryngoscopy with laser and balloon dilation N/A 07/20/2012    Procedure: MICROLARYNGOSCOPY WITH LASER AND BALLOON DILATION;  Surgeon: Christia Reading, MD;  Location: [facility] OR;  Service: ENT;  Laterality: N/A;  WITH JET VENTURI VENTILATION    There were no vitals filed for this visit.  Visit Diagnosis:  Muscle weakness  Left hip pain      Subjective Assessment - 06/08/15 0953    Subjective Pt reports having some pain in the lat gastroc after the last session possibly due to the dry needling.  However, after going home for lunch where she was able to sit down, she stated that it felt "a lot better."  Pt reported the rest of the week was "the best I've felt in a while."   Pt had to do some shoveling over the  weekend and reported only minor stiffness and soreness in lower back.     Limitations Sitting   Diagnostic tests xrays negative   Patient Stated Goals to have decreased pain   Currently in Pain? No/denies   Pain Onset More than a month ago   Multiple Pain Sites No            Objective: 3x10 leg press on TG lv. 19 to incr strength in B LE.  Cuing needed to keep feet shoulder width apart and not hyperextend knees.  Pt indicated feeling a stretching sensation in the lat aspect of her L LE during exercise. 3x10 L LE step up on 10 in step to incr strength.  Pt demonstrated better femoral control during exercise as well as a decr in L side bending during the step up phase of the exercise.  Pt needed to stop exercise due to fatigue and poor form.  She was able to resume with proper form after a break. 3x10 leg lifts on red ball to incr core strength and proprioceptive awareness.  Pt was cued to cross arms across her chest to incr the difficulty of the exercise.   3x5 each leg side step monster walks with YTB around ankles to incr strength in  B LE.  Cuing needed to keep knees bent and to control the following leg as it comes back together with leading leg. Dry needling - two needles in vastus lateralis, one proximal and one distal with the proximal having a stronger response than the distal.  One needle in lat gastoc which produced the most twitch response.  One needle in peroneal muscle belly which produce the most pain. Pt reported feeling sore after needling and demonstrated an antalgic gait.  PT stated that this was a normal response and that soreness should subside by the next day.                     PT Education - 06/08/15 1011    Education provided Yes   Education Details Pt was educated on new HEP.   Person(s) Educated Patient   Methods Explanation;Demonstration;Verbal cues   Comprehension Verbalized understanding;Returned demonstration             PT Long Term  Goals - 06/03/15 1012    PT LONG TERM GOAL #1   Title Patient will report decreased pain with functional activities. <2/10   Baseline 2/10   Time 4   Period Weeks   Status Achieved   PT LONG TERM GOAL #2   Title Patient will have improvement on Oswestry of < 26%   Baseline 26%   Time 4   Period Weeks   Status Deferred   PT LONG TERM GOAL #3   Title Pt will be able to perform 20 wall squats independently to possess the strength neessary for ADLs   Baseline 5   Time 4   Period Weeks   Status New   PT LONG TERM GOAL #4   Title Pt will work entire shift with <3/10 pain by the end of the shift.   Baseline 6/10   Time 4   Period Weeks   Status New               Plan - 06/08/15 1012    Clinical Impression Statement Pt continues to improve in strength and control during exercises.  Her overall outlook on her progress is very positive and she expresses a desire to continue PT.  She is still lacking in LE and core strength and would benefit from continued PT to address these deficits.  During the session she stated occasional pain on the lat aspect of her foot/ankle indicating the possibility of the beginning stages of tendinopathy.  Next treatment session will incorporate a more in depth foot/ankle exam to determine any deficits.   Pt will benefit from skilled therapeutic intervention in order to improve on the following deficits Pain;Decreased activity tolerance   Rehab Potential Fair   PT Frequency 2x / week   PT Duration 4 weeks   PT Treatment/Interventions Moist Heat;Therapeutic exercise;Manual techniques;Dry needling;Electrical Stimulation   PT Next Visit Plan LE strengthening, manual therapy, STM   PT Home Exercise Plan Bridging, wall sits, step ups, isometric L foot/ankle eversion   Consulted and Agree with Plan of Care Patient        Problem List Patient Active Problem List   Diagnosis Date Noted  . Left-sided low back pain with left-sided sciatica 04/28/2015  .  Obesity 04/28/2015  . Pre-syncope 07/14/2014    Fisher,Benjamin PT DPT 06/08/2015, 11:28 AM  [facility] PHYSICAL AND SPORTS MEDICINE [address] [place], [address] Phone: [phone]   Fax:  [phone]  Name: Emery  TAKEYAH WIEMAN MRN: [id number] Date of Birth: 1976-03-28

## 2015-06-10 ENCOUNTER — Encounter: Payer: Self-pay | Admitting: Physical Therapy

## 2015-06-10 ENCOUNTER — Ambulatory Visit: Payer: 59 | Admitting: Physical Therapy

## 2015-06-10 DIAGNOSIS — M25552 Pain in left hip: Secondary | ICD-10-CM

## 2015-06-10 DIAGNOSIS — M6281 Muscle weakness (generalized): Secondary | ICD-10-CM | POA: Diagnosis not present

## 2015-06-10 NOTE — Therapy (Signed)
[facility]TS [facility]IC[facility] [address] [place], [address] Phone: [phone]   Fax:  [phone]  Physical Therapy Treatment  Patient Details  Name: LYRIQUE HAKIM MRN: [id number] Date of Birth: 10-27-1975 Referring Provider: Darrick Huntsman  Encounter Date: 06/10/2015      PT End of Session - 06/10/15 1027    Visit Number 9   Number of Visits 9   Date for PT Re-Evaluation 06/01/15   PT Start Time 0900   PT Stop Time 0945   PT Time Calculation (min) 45 min   Activity Tolerance Patient tolerated treatment well;No increased pain   Behavior During Therapy [facility] for tasks assessed/performed      Past Medical History  Diagnosis Date  . Complication of anesthesia   . PONV (postoperative nausea and vomiting)   . Family history of anesthesia complication     "morphine night terrors"  . Dysrhythmia     "does not see cardiologist"  . Shortness of breath     "wheezing"  . Bronchitis     hx of  . GERD (gastroesophageal reflux disease)   . Headache(784.0)     "migraines"  . Hypertension     Past Surgical History  Procedure Laterality Date  . Cesarean section      x 2  . Widom extractions    . Tongue flap release    . Microlaryngoscopy with laser and balloon dilation N/A 07/20/2012    Procedure: MICROLARYNGOSCOPY WITH LASER AND BALLOON DILATION;  Surgeon: Christia Reading, MD;  Location: [facility] OR;  Service: ENT;  Laterality: N/A;  WITH JET VENTURI VENTILATION    There were no vitals filed for this visit.  Visit Diagnosis:  Left hip pain  Muscle weakness      Subjective Assessment - 06/10/15 1025    Subjective Pt reported experiencing pain and discomfort after the last session that lasted longer than usual.  She stated that she went to the gym last night and had pain "above my knee, down the side of my leg, and across my foot" however subsided after several minutes.  Pt reports minor soreness today.   Limitations Sitting   Diagnostic tests xrays negative   Patient Stated Goals to have decreased pain   Currently in Pain? No/denies   Pain Onset More than a month ago        Objective:  Extensive manual assessment of B ankles and feet due to current areas of pain and prior h/o plantar fascitis. Decr. DF on L 15 degr. Compared with R, AP/PA glides of L hypomobile. "This feels good". Also has highly limited in eversion B, inv WNL. Strength is 5/5 for ankles, with manual palpation pt c/o tenderness on L foot in deep intrinsics and the dorso-lateral aspect, no pain on R.  3x45 sec talo-crural mobilization on L foot to address pt's c/o pain in foot when performing DF in standing.  Pt responded with a decr in pain after mobilization and ability to fully DF foot into a calf raise. 10 min STM to lat L hip and calf to decr pt's pain in those areas while incr her ability to move efficiently.  Noted decr in TP and pain with walking after STM.                         PT Education - 06/10/15 1026    Education provided Yes   Education Details Pt was educated on limiting  her activity at work and at the gym to avoid excessive pain and soreness at the end of the day.   Person(s) Educated Patient   Methods Explanation;Verbal cues   Comprehension Verbalized understanding             PT Long Term Goals - 06/03/15 1012    PT LONG TERM GOAL #1   Title Patient will report decreased pain with functional activities. <2/10   Baseline 2/10   Time 4   Period Weeks   Status Achieved   PT LONG TERM GOAL #2   Title Patient will have improvement on Oswestry of < 26%   Baseline 26%   Time 4   Period Weeks   Status Deferred   PT LONG TERM GOAL #3   Title Pt will be able to perform 20 wall squats independently to possess the strength neessary for ADLs   Baseline 5   Time 4   Period Weeks   Status New   PT LONG TERM GOAL #4   Title Pt will work entire shift with <3/10 pain by the end of the shift.    Baseline 6/10   Time 4   Period Weeks   Status New               Plan - 06/10/15 1027    Clinical Impression Statement Due to pt report of an incr in pain after last visit, a more conservative approach was taken for the session.  Foot and ankle was assessed to determine any connection between her reported pain in the area and pain in the calf/thigh.  Pt was notably less mobile and weaker in L foot/ankle as compared to the R which could be a contributing factor to pain further up the LE chain.  Pt responded well to MT to L foot with an incr ability to PF the L foot in standing and with a decr in pain across the foot.   Pt continues to be lacking in the LE strength and endurance needed to perform ADLs without pain.  She would continue to benefit from skilled PT to address these deficits.   Pt will benefit from skilled therapeutic intervention in order to improve on the following deficits Pain;Decreased activity tolerance   Rehab Potential Fair   PT Frequency 2x / week   PT Duration 4 weeks   PT Treatment/Interventions Moist Heat;Therapeutic exercise;Manual techniques;Dry needling;Electrical Stimulation   PT Next Visit Plan LE strengthening, manual therapy, STM   PT Home Exercise Plan Bridging, wall sits, step ups, isometric L foot/ankle eversion   Consulted and Agree with Plan of Care Patient        Problem List Patient Active Problem List   Diagnosis Date Noted  . Left-sided low back pain with left-sided sciatica 04/28/2015  . Obesity 04/28/2015  . Pre-syncope 07/14/2014    Vilinda Flake SPT 06/10/2015, 10:44 AM  Su Hoff PT DPT  [facility] PHYSICAL AND SPORTS MEDICINE [address], [place], [address] Phone: [phone]   Fax:  [phone]  Name: ZOYE CHANDRA MRN: [id number] Date of Birth: [birth year]

## 2015-06-16 ENCOUNTER — Encounter: Payer: Self-pay | Admitting: Physical Therapy

## 2015-06-16 ENCOUNTER — Ambulatory Visit: Payer: 59 | Admitting: Physical Therapy

## 2015-06-16 DIAGNOSIS — M6281 Muscle weakness (generalized): Secondary | ICD-10-CM | POA: Diagnosis not present

## 2015-06-16 DIAGNOSIS — M25552 Pain in left hip: Secondary | ICD-10-CM | POA: Diagnosis not present

## 2015-06-16 NOTE — Therapy (Signed)
[facility] PHYS[facility]ORTS MEDICINE [address], [place], [address] Phone: [phone]   Fax:  [phone]  Physical Therapy Treatment  Patient Details  Name: Lisa [name] MRN: [id number] Date of Birth: Jun 10, 1975 Referring Provider: Darrick Huntsman  Encounter Date: 06/16/2015      PT End of Session - 06/16/15 0838    Visit Number 10   Number of Visits 15   Date for PT Re-Evaluation 07/02/15   PT Start Time 0815   PT Stop Time 0900   PT Time Calculation (min) 45 min   Activity Tolerance Patient tolerated treatment well;No increased pain   Behavior During Therapy [facility] for tasks assessed/performed      Past Medical History  Diagnosis Date  . Complication of anesthesia   . PONV (postoperative nausea and vomiting)   . Family history of anesthesia complication     "morphine night terrors"  . Dysrhythmia     "does not see cardiologist"  . Shortness of breath     "wheezing"  . Bronchitis     hx of  . GERD (gastroesophageal reflux disease)   . Headache(784.0)     "migraines"  . Hypertension     Past Surgical History  Procedure Laterality Date  . Cesarean section      x 2  . Widom extractions    . Tongue flap release    . Microlaryngoscopy with laser and balloon dilation N/A 07/20/2012    Procedure: MICROLARYNGOSCOPY WITH LASER AND BALLOON DILATION;  Surgeon: Christia Reading, MD;  Location: [facility] OR;  Service: ENT;  Laterality: N/A;  WITH JET VENTURI VENTILATION    There were no vitals filed for this visit.  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  She stated that she put heat on it and it seemed to help "a little."  Pt reported that she went to the gym over the weekend and it helped with the pain (walking 2 1/2 miles alothough she experienced some  soreness after.   Limitations Sitting   Diagnostic tests xrays negative   Patient Stated Goals to have decreased pain   Currently in Pain? Yes   Pain Score 2    Pain Location Back   Pain Orientation Left   Pain Onset More than a month ago        Objective:  Wall sits 5x20 sec to incr strength in B LE to allow pt to incr their functional level in performing ADLs at home and work.  Pt responded with experiencing fatigue at the end of the exercise. TG squats lv 23 4x7 to address pt's LE strength deficits.  Exercise designed to incr power with pt responding with notable fatigue.  Cuing needed to engage the TA during exercise. 3x3 ball walkouts to incr strength in B LE and core to address pt's deficits of decr core and LE strength.  Cuing needed to keep hips up and not let them sag throughout exercise.                           PT Education - 06/16/15 0829    Education provided Yes   Education Details Pt was educated on new HEP and to how to properly engage her TA during lifting activities at work   [employer])  Educated Patient   Methods Explanation;Demonstration;Verbal cues   Comprehension Verbalized understanding;Returned demonstration             PT Long Term Goals - 06/03/15 1012    PT LONG TERM GOAL #1   Title Patient will report decreased pain with functional activities. <2/10   Baseline 2/10   Time 4   Period Weeks   Status Achieved   PT LONG TERM GOAL #2   Title Patient will have improvement on Oswestry of < 26%   Baseline 26%   Time 4   Period Weeks   Status Deferred   PT LONG TERM GOAL #3   Title Pt will be able to perform 20 wall squats independently to possess the strength neessary for ADLs   Baseline 5   Time 4   Period Weeks   Status New   PT LONG TERM GOAL #4   Title Pt will work entire shift with <3/10 pain by the end of the shift.   Baseline 6/10   Time 4   Period Weeks   Status New               Plan - 06/16/15 4098    Clinical Impression Statement Pt  presents with continued strength deficits in B LE and some tenderness to palpation in L LE.  Pt performed well with treatment as evidenced by her ability to participate in higher level strengthening exercises.  She continues to demonstrate a decr in pain with only muscle soreness in B LE.  Pt still is lacking in muscle endurance which is most evident at work with prolonged standing and reaching/lifting tools and pt's.  She would continue to benefit from skilled PT to address these functional limitations.   Pt will benefit from skilled therapeutic intervention in order to improve on the following deficits Pain;Decreased activity tolerance   Rehab Potential Fair   PT Frequency 2x / week   PT Duration 4 weeks   PT Treatment/Interventions Moist Heat;Therapeutic exercise;Manual techniques;Dry needling;Electrical Stimulation   PT Next Visit Plan LE strengthening, manual therapy, STM   PT Home Exercise Plan Bridging, wall sits, step ups, isometric L foot/ankle eversion   Consulted and Agree with Plan of Care Patient        Problem List Patient Active Problem List   Diagnosis Date Noted  . Left-sided low back pain with left-sided sciatica 04/28/2015  . Obesity 04/28/2015  . Pre-syncope 07/14/2014    Vilinda Flake SPT 06/16/2015, 9:00 AM  Su Hoff PT DPT   [facility] PHYSICAL AND SPORTS MEDICINE [address] [place], [address] Phone: [phone]   Fax:  [phone]  Name: Lisa [name] MRN: [id number] Date of Birth: 12-05-1975

## 2015-06-18 ENCOUNTER — Ambulatory Visit: Payer: 59 | Attending: Internal Medicine | Admitting: Physical Therapy

## 2015-06-18 ENCOUNTER — Encounter: Payer: Self-pay | Admitting: Physical Therapy

## 2015-06-18 DIAGNOSIS — M25552 Pain in left hip: Secondary | ICD-10-CM | POA: Insufficient documentation

## 2015-06-18 DIAGNOSIS — M6281 Muscle weakness (generalized): Secondary | ICD-10-CM | POA: Insufficient documentation

## 2015-06-18 NOTE — Therapy (Signed)
[facility] PHYS[facility] SPORTS MEDICINE [address] [place], [address] Phone: [phone]   Fax:  [phone]  Physical Therapy Treatment  Patient Details  Name: Lisa [name] MRN: [id number] Date of Birth: 1976-01-12 Referring Provider: Darrick Huntsman  Encounter Date: 06/18/2015      PT End of Session - 06/18/15 0837    Visit Number 11   Number of Visits 15   Date for PT Re-Evaluation 07/02/15   PT Start Time 0815   PT Stop Time 0853   PT Time Calculation (min) 38 min   Activity Tolerance Patient tolerated treatment well;No increased pain   Behavior During Therapy [facility] for tasks assessed/performed      Past Medical History  Diagnosis Date  . Complication of anesthesia   . PONV (postoperative nausea and vomiting)   . Family history of anesthesia complication     "morphine night terrors"  . Dysrhythmia     "does not see cardiologist"  . Shortness of breath     "wheezing"  . Bronchitis     hx of  . GERD (gastroesophageal reflux disease)   . Headache(784.0)     "migraines"  . Hypertension     Past Surgical History  Procedure Laterality Date  . Cesarean section      x 2  . Widom extractions    . Tongue flap release    . Microlaryngoscopy with laser and balloon dilation N/A 07/20/2012    Procedure: MICROLARYNGOSCOPY WITH LASER AND BALLOON DILATION;  Surgeon: Christia Reading, MD;  Location: [facility] OR;  Service: ENT;  Laterality: N/A;  WITH JET VENTURI VENTILATION    There were no vitals filed for this visit.  Visit Diagnosis:  Muscle weakness  Left hip pain      Subjective Assessment - 06/18/15 0822    Subjective Pt reports being "pretty sore" after going to the gym on Tues.  She states that she had some minor leg some soreness during work but nothing that is limiting her than causing her to be "gaurded."   Limitations Sitting   Diagnostic tests xrays negative   Patient Stated Goals to have decreased pain   Currently in  Pain? Yes   Pain Score 2    Pain Location Back   Pain Orientation Left   Pain Onset More than a month ago      Objective:  4x25 sec wall sits to incr strength in B LE to allow better functionality during ADLs at home and work.  Pt was very fatigued afterwards with no incr in pain. 4x7 TG squats lv 23 to incr strength in B LE.  Pt continues to improve and progress with this exercise while still needing cuing to activate the TA. 3x3 ball walkouts to incr dynamic mobility and strength in B LE.  Pt also states that she feel her core "getting a workout."  Pt was notably fatigued and SOB after exercise without incr her pain.  She required cuing to not hold her breath. In addition to fatigue, pt extremely slow with performance - cued to minimize breaks.                            PT Education - 06/18/15 0836    Education provided Yes   Education Details Pt was educated on new HEP and to maintain a TA contraction throughout ADLs.   Person(s) Educated Patient   Methods Explanation;Demonstration;Verbal cues   Comprehension  Returned demonstration;Verbalized understanding             PT Long Term Goals - 06/03/15 1012    PT LONG TERM GOAL #1   Title Patient will report decreased pain with functional activities. <2/10   Baseline 2/10   Time 4   Period Weeks   Status Achieved   PT LONG TERM GOAL #2   Title Patient will have improvement on Oswestry of < 26%   Baseline 26%   Time 4   Period Weeks   Status Deferred   PT LONG TERM GOAL #3   Title Pt will be able to perform 20 wall squats independently to possess the strength neessary for ADLs   Baseline 5   Time 4   Period Weeks   Status New   PT LONG TERM GOAL #4   Title Pt will work entire shift with <3/10 pain by the end of the shift.   Baseline 6/10   Time 4   Period Weeks   Status New               Plan - 06/18/15 1610    Clinical Impression Statement Pt continues to show imporvement in  strength gains and confidence with movement patterns that were she was previoulsy unable to perform without pain.  She reports minor soreness in her legs and low back during exerise but nothing that is limiting her from fully participatiing in her HEP.  She continues to require further strengthening to to address her power and endurance deficits needed to perform ADLs at home and work.  Skilled PT would still be beneficial to help her with these deficits.   Pt will benefit from skilled therapeutic intervention in order to improve on the following deficits Pain;Decreased activity tolerance   Rehab Potential Fair   PT Frequency 2x / week   PT Duration 4 weeks   PT Treatment/Interventions Moist Heat;Therapeutic exercise;Manual techniques;Dry needling;Electrical Stimulation   PT Next Visit Plan LE strengthening, manual therapy, STM   PT Home Exercise Plan Bridging, wall sits, step ups, isometric L foot/ankle eversion   Consulted and Agree with Plan of Care Patient        Problem List Patient Active Problem List   Diagnosis Date Noted  . Left-sided low back pain with left-sided sciatica 04/28/2015  . Obesity 04/28/2015  . Pre-syncope 07/14/2014    Vilinda Flake SPT 06/18/2015, 8:58 AM  Su Hoff PT DPT  [place] [facility] PHYSICAL AND SPORTS MEDICINE [address] [place], [address] Phone: [phone]   Fax:  [phone]  Name: Lisa [name] MRN: [id number] Date of Birth: [birth year]

## 2015-06-23 ENCOUNTER — Ambulatory Visit: Payer: 59 | Admitting: Physical Therapy

## 2015-06-23 ENCOUNTER — Encounter: Payer: Self-pay | Admitting: Physical Therapy

## 2015-06-23 DIAGNOSIS — M25552 Pain in left hip: Secondary | ICD-10-CM

## 2015-06-23 DIAGNOSIS — M6281 Muscle weakness (generalized): Secondary | ICD-10-CM | POA: Diagnosis not present

## 2015-06-23 NOTE — Therapy (Signed)
[place] [facility] PHYSICAL [facility] SPORTS MEDICINE [address], [place], [address] Phone: [phone]   Fax:  [phone]  Physical Therapy Treatment  Patient Details  Name: Lisa [name] MRN: [id number] Date of Birth: 14-Dec-1975 Referring Provider: Darrick Huntsman  Encounter Date: 06/23/2015      PT End of Session - 06/23/15 0901    Visit Number 12   Number of Visits 15   Date for PT Re-Evaluation 07/02/15   PT Start Time 0815   PT Stop Time 0900   PT Time Calculation (min) 45 min   Activity Tolerance Patient tolerated treatment well;No increased pain   Behavior During Therapy [facility] for tasks assessed/performed      Past Medical History  Diagnosis Date  . Complication of anesthesia   . PONV (postoperative nausea and vomiting)   . Family history of anesthesia complication     "morphine night terrors"  . Dysrhythmia     "does not see cardiologist"  . Shortness of breath     "wheezing"  . Bronchitis     hx of  . GERD (gastroesophageal reflux disease)   . Headache(784.0)     "migraines"  . Hypertension     Past Surgical History  Procedure Laterality Date  . Cesarean section      x 2  . Widom extractions    . Tongue flap release    . Microlaryngoscopy with laser and balloon dilation N/A 07/20/2012    Procedure: MICROLARYNGOSCOPY WITH LASER AND BALLOON DILATION;  Surgeon: Christia Reading, MD;  Location: [facility] OR;  Service: ENT;  Laterality: N/A;  WITH JET VENTURI VENTILATION    There were no vitals filed for this visit.  Visit Diagnosis:  Muscle weakness  Left hip pain      Subjective Assessment - 06/23/15 0821    Subjective Pt reports some pain and soreness in the morning when she first getsout of bed but "it works itself out."  She states she is able to work out with minimal discomfort and was able to use the recumbant bike for the first time last week.   Limitations Sitting   Diagnostic tests xrays negative   Patient Stated Goals to  have decreased pain   Currently in Pain? No/denies   Pain Onset More than a month ago       Objective:  Wall sits 3x30 sec to incr strength and endurance in B LE.  Duration of wall sit was incr since last visit causing pt to become notably fatigued after exercise. TG squats lv 23 5x7 to incr strength and power in B LE.  PT noted improvement of pt's performance in the exercise as evidenced by less fatigue and better form with no cuing needed. Deadlifts 1x10 no weight to incr B LE strength and incorporate functional movement patterns into exercise.  Cuing needed to maintain a straight back, slight bend in knees, and weight shift back onto the heels during exercise.  Pt experienced difficulty with proper form but had a high degree of fatigue in the hamstrings and glutes. 1x31min B lunges to incr strength and endurance in B LE and to incr functionality in exercise.  Cuing needed to maintain level hips throughout exercise and to prevent knee valgus.                            PT Education - 06/23/15 0900    Education provided Yes   Education Details Pt  educated on new HEP and to progress therapy to once per week due to improvement in status.   Person(s) Educated Patient   Methods Explanation;Demonstration;Verbal cues   Comprehension Returned demonstration;Verbalized understanding             PT Long Term Goals - 06/03/15 1012    PT LONG TERM GOAL #1   Title Patient will report decreased pain with functional activities. <2/10   Baseline 2/10   Time 4   Period Weeks   Status Achieved   PT LONG TERM GOAL #2   Title Patient will have improvement on Oswestry of < 26%   Baseline 26%   Time 4   Period Weeks   Status Deferred   PT LONG TERM GOAL #3   Title Pt will be able to perform 20 wall squats independently to possess the strength neessary for ADLs   Baseline 5   Time 4   Period Weeks   Status New   PT LONG TERM GOAL #4   Title Pt will work entire shift  with <3/10 pain by the end of the shift.   Baseline 6/10   Time 4   Period Weeks   Status New               Plan - 06/23/15 1009    Clinical Impression Statement Pt is much improved since last visit as evidenced by more efficient movement patterns and an incr in endurance level during exercise.  She is continuing to improve in strength and endurance and is able to complete a consistent workout routine at the gym.  Although pt still needs improvement in strength and endurance, PT advised her to decr her visits to once per week and continue gym routine for the remainder of her visits.   Pt will benefit from skilled therapeutic intervention in order to improve on the following deficits Pain;Decreased activity tolerance   Rehab Potential Fair   PT Frequency 2x / week   PT Duration 4 weeks   PT Treatment/Interventions Moist Heat;Therapeutic exercise;Manual techniques;Dry needling;Electrical Stimulation   PT Next Visit Plan LE strengthening, manual therapy, STM   PT Home Exercise Plan Bridging, wall sits, step ups, isometric L foot/ankle eversion   Consulted and Agree with Plan of Care Patient        Problem List Patient Active Problem List   Diagnosis Date Noted  . Left-sided low back pain with left-sided sciatica 04/28/2015  . Obesity 04/28/2015  . Pre-syncope 07/14/2014    Vilinda Flake SPT 06/23/2015, 10:16 AM  Su Hoff PT DPT Student supervised at all times by onsite clinician  [facility] PHYSICAL AND SPORTS MEDICINE [address], [place], [address] Phone: [phone]   Fax:  [phone]  Name: Lisa [name] MRN: [id number] Date of Birth: May 19, 1975

## 2015-06-25 ENCOUNTER — Encounter: Payer: 59 | Admitting: Physical Therapy

## 2015-06-29 ENCOUNTER — Ambulatory Visit: Payer: Self-pay | Admitting: Internal Medicine

## 2015-06-29 ENCOUNTER — Ambulatory Visit: Payer: 59 | Admitting: Physical Therapy

## 2015-06-29 ENCOUNTER — Encounter: Payer: Self-pay | Admitting: Physical Therapy

## 2015-06-29 DIAGNOSIS — M25552 Pain in left hip: Secondary | ICD-10-CM

## 2015-06-29 DIAGNOSIS — M6281 Muscle weakness (generalized): Secondary | ICD-10-CM | POA: Diagnosis not present

## 2015-06-29 NOTE — Therapy (Signed)
[place] [facility] PHYSICAL [facility] SPORTS MEDICINE [address], [place], [address] Phone: [phone]   Fax:  [phone]  Physical Therapy Treatment  Patient Details  Name: Lisa [name] MRN: [id number] Date of Birth: 1976/01/24 Referring Provider: Darrick Huntsman  Encounter Date: 06/29/2015      PT End of Session - 06/29/15 0950    Visit Number 13   Number of Visits 15   Date for PT Re-Evaluation 07/02/15   PT Start Time 0908   PT Stop Time 0949   PT Time Calculation (min) 41 min   Activity Tolerance Patient tolerated treatment well;No increased pain   Behavior During Therapy [facility] for tasks assessed/performed      Past Medical History  Diagnosis Date  . Complication of anesthesia   . PONV (postoperative nausea and vomiting)   . Family history of anesthesia complication     "morphine night terrors"  . Dysrhythmia     "does not see cardiologist"  . Shortness of breath     "wheezing"  . Bronchitis     hx of  . GERD (gastroesophageal reflux disease)   . Headache(784.0)     "migraines"  . Hypertension     Past Surgical History  Procedure Laterality Date  . Cesarean section      x 2  . Widom extractions    . Tongue flap release    . Microlaryngoscopy with laser and balloon dilation N/A 07/20/2012    Procedure: MICROLARYNGOSCOPY WITH LASER AND BALLOON DILATION;  Surgeon: Christia Reading, MD;  Location: [facility] OR;  Service: ENT;  Laterality: N/A;  WITH JET VENTURI VENTILATION    There were no vitals filed for this visit.  Visit Diagnosis:  Muscle weakness  Left hip pain      Subjective Assessment - 06/29/15 0902    Subjective Pt reports soreness after last session but states that it is muscle soreness rather than pain.  She states she is able to fully particiate in her HEP and workout routine wiht no problems.   Limitations Sitting   Diagnostic tests xrays negative   Patient Stated Goals to have decreased pain   Currently in Pain?  No/denies   Pain Onset More than a month ago       Objective:  10 min Nu-Step to incr muscle and cardiovascular endurance to allow pt to perform ADLs more efficiently.  Pt was fatigued after exercise and stated that she does a similar machine at the gym.  No charge. Wall sits 3x40 sec to incr strength in B LE and incr endurance.  Pt was notably fatigued after exercise, however, this is the longest she performed this exercise since the beginning of treatment, required cuing to avoid UE assistance. Wall squats with ball 4x7 to incr B LE strength and improve quality of functional tasks.  Cuing needed to contract TA and to keep feet shoulder width apart.   Supine dead bug variation 2x5 each leg to incr core strength and improve LE control.  Cuing needed to contract TA and maintain a post pelvic tilt throughout exercise.  Pt performed exercise with good control while stating "it's harder than it looks."                          PT Education - 06/29/15 0913    Education provided Yes   Education Details Pt was educated on new HEP and to continue her workout routine at the gym.  Person(s) Educated Patient   Methods Explanation;Demonstration;Verbal cues   Comprehension Returned demonstration;Verbalized understanding             PT Long Term Goals - 06/03/15 1012    PT LONG TERM GOAL #1   Title Patient will report decreased pain with functional activities. <2/10   Baseline 2/10   Time 4   Period Weeks   Status Achieved   PT LONG TERM GOAL #2   Title Patient will have improvement on Oswestry of < 26%   Baseline 26%   Time 4   Period Weeks   Status Deferred   PT LONG TERM GOAL #3   Title Pt will be able to perform 20 wall squats independently to possess the strength neessary for ADLs   Baseline 5   Time 4   Period Weeks   Status New   PT LONG TERM GOAL #4   Title Pt will work entire shift with <3/10 pain by the end of the shift.   Baseline 6/10   Time 4    Period Weeks   Status New               Plan - 06/29/15 0950    Clinical Impression Statement Pt continues to demonstrate improvement from session to session with her only complaint being muscle soreness from exercising.  She presents with improved strength and endurance as evidenced by how well she tolerated an incr in exercise progression for core and LE.  Moevemnt and exercise will be key in her long term prognosis and pain outcomes.  PT discussed with pt the future plan of care for the final two visits and how to transition to an extensive HEP that she can peroform at the gym.   Pt will benefit from skilled therapeutic intervention in order to improve on the following deficits Pain;Decreased activity tolerance   Rehab Potential Fair   PT Frequency 2x / week   PT Duration 4 weeks   PT Treatment/Interventions Moist Heat;Therapeutic exercise;Manual techniques;Dry needling;Electrical Stimulation   PT Next Visit Plan LE strengthening, manual therapy, STM   PT Home Exercise Plan Bridging, wall sits, step ups, isometric L foot/ankle eversion   Consulted and Agree with Plan of Care Patient        Problem List Patient Active Problem List   Diagnosis Date Noted  . Left-sided low back pain with left-sided sciatica 04/28/2015  . Obesity 04/28/2015  . Pre-syncope 07/14/2014    Vilinda Flake SPT 06/29/2015, 9:58 AM  Su Hoff PT DPT  [place] [facility] PHYSICAL AND SPORTS MEDICINE [address], [place], [address] Phone: [phone]   Fax:  [phone]  Name: GRAYCEE GREESON MRN: [id number] Date of Birth: 1975/06/19

## 2015-06-30 ENCOUNTER — Encounter: Payer: 59 | Admitting: Physical Therapy

## 2015-07-02 ENCOUNTER — Encounter: Payer: 59 | Admitting: Physical Therapy

## 2015-07-09 ENCOUNTER — Encounter: Payer: Self-pay | Admitting: Physical Therapy

## 2015-07-09 ENCOUNTER — Ambulatory Visit: Payer: 59 | Admitting: Physical Therapy

## 2015-07-09 DIAGNOSIS — M6281 Muscle weakness (generalized): Secondary | ICD-10-CM | POA: Diagnosis not present

## 2015-07-09 DIAGNOSIS — M25552 Pain in left hip: Secondary | ICD-10-CM

## 2015-07-09 NOTE — Therapy (Signed)
[place] [facility] PHYSICAL [facility] SPORTS MEDICINE [address] [place], [address] Phone: [phone]   Fax:  [phone]  Physical Therapy Treatment  Patient Details  Name: Lisa [name] MRN: [id number] Date of Birth: [birth year] Referring Provider: Darrick Huntsman  Encounter Date: 07/09/2015      PT End of Session - 07/09/15 1632    Visit Number 14   Number of Visits 15   Date for PT Re-Evaluation 07/02/15   PT Start Time 1545   PT Stop Time 1640   PT Time Calculation (min) 55 min   Activity Tolerance Patient tolerated treatment well;No increased pain   Behavior During Therapy [facility] for tasks assessed/performed      Past Medical History  Diagnosis Date  . Complication of anesthesia   . PONV (postoperative nausea and vomiting)   . Family history of anesthesia complication     "morphine night terrors"  . Dysrhythmia     "does not see cardiologist"  . Shortness of breath     "wheezing"  . Bronchitis     hx of  . GERD (gastroesophageal reflux disease)   . Headache(784.0)     "migraines"  . Hypertension     Past Surgical History  Procedure Laterality Date  . Cesarean section      x 2  . Widom extractions    . Tongue flap release    . Microlaryngoscopy with laser and balloon dilation N/A 07/20/2012    Procedure: MICROLARYNGOSCOPY WITH LASER AND BALLOON DILATION;  Surgeon: Christia Reading, MD;  Location: [facility] OR;  Service: ENT;  Laterality: N/A;  WITH JET VENTURI VENTILATION    There were no vitals filed for this visit.  Visit Diagnosis:  Muscle weakness  Left hip pain      Subjective Assessment - 07/09/15 1552    Subjective Pt reports not being able to go to the gym due to her busy schedule of work and kids being sick.  She states "my leg has killing me since I haven't neeb able to go to the gym".  Also reports having to take some anti-inflammatory meds to control her pain level.   Limitations Sitting   Diagnostic tests xrays negative    Patient Stated Goals to have decreased pain   Currently in Pain? Yes   Pain Score 3    Pain Location Leg   Pain Orientation Left;Lateral   Pain Onset More than a month ago   Multiple Pain Sites No       Objective:  3x30 sec wall sits to incr B LE strength and endurance.  Pt fatigued quickly with exercise possibly to due inactivity the last few weeks. Cuing needed to activate TA during exercise and not place hands on knees. 3x15 TG squats lv 23 to incr strength and endurance in B LE and incr ability to perform ADLs at home and work.  Cuing needed to maintain contraction of TA throughout exercise.  Pt was fatigued, however, was able to fully complete exercise with no problems. 2x20 feet lat monster walks and 2x20 resisted walking with RTB to incr strength and endurance in B LEs.  Pt required cuing to keep a slight bend in the knees and toes pointed straight ahead.  Also needed cuing to prevent knee valgus.  Pt was notably fatigued with no issues performing exercise. 3x10 bridging on ball to incr core stability and ability to perform functional ADLs.  Pt required cuing to contract TA and to avoid using her UEs  to push off the table.  Pt reported the exercise being "harder than it looks" yet was able to fully complete exercise with moderate difficulty.                          PT Education - 07/09/15 1555    Education provided Yes   Education Details Pt educated on new HEP and to continue her exercise and walking routine at the gym.   Person(s) Educated Patient   Methods Explanation;Demonstration   Comprehension Verbalized understanding;Returned demonstration;Verbal cues required             PT Long Term Goals - 06/03/15 1012    PT LONG TERM GOAL #1   Title Patient will report decreased pain with functional activities. <2/10   Baseline 2/10   Time 4   Period Weeks   Status Achieved   PT LONG TERM GOAL #2   Title Patient will have improvement on Oswestry of  < 26%   Baseline 26%   Time 4   Period Weeks   Status Deferred   PT LONG TERM GOAL #3   Title Pt will be able to perform 20 wall squats independently to possess the strength neessary for ADLs   Baseline 5   Time 4   Period Weeks   Status New   PT LONG TERM GOAL #4   Title Pt will work entire shift with <3/10 pain by the end of the shift.   Baseline 6/10   Time 4   Period Weeks   Status New               Plan - 07/09/15 1635    Clinical Impression Statement Pt demonstrates slight regression since last visit possibly due to the fact that she was unable to exercise consistently like she has been in the past.  Tenderness to palpation in the L greater trochanter, glut med, IT Band and lat knee.  Although she has shown some regression, pt continues to demonstrate improvement and understanding that exercise and activity are the key to decr her pain and incr her functional status.  She has demonstrated significant improvement in strength, endurance, and power since the beginning of therapy and has shown the ability to manage her pain on her own.  Pt has one session left and will be ready for d/c at that time.   Pt will benefit from skilled therapeutic intervention in order to improve on the following deficits Pain;Decreased activity tolerance   Rehab Potential Fair   PT Frequency 2x / week   PT Duration 4 weeks   PT Treatment/Interventions Moist Heat;Therapeutic exercise;Manual techniques;Dry needling;Electrical Stimulation   PT Next Visit Plan LE strengthening, manual therapy, STM   PT Home Exercise Plan Bridging, wall sits, step ups, isometric L foot/ankle eversion   Consulted and Agree with Plan of Care Patient        Problem List Patient Active Problem List   Diagnosis Date Noted  . Left-sided low back pain with left-sided sciatica 04/28/2015  . Obesity 04/28/2015  . Pre-syncope 07/14/2014    Vilinda Flake SPT 07/09/2015, 4:46 PM  Su Hoff PT DPT  [facility] PHYSICAL AND SPORTS MEDICINE [address], [place], [address] Phone: [phone]   Fax:  [phone]  Name: Lisa [name] MRN: [id number] Date of Birth: 1975/11/16

## 2015-07-16 ENCOUNTER — Ambulatory Visit: Payer: 59 | Attending: Internal Medicine | Admitting: Physical Therapy

## 2015-07-16 DIAGNOSIS — M25552 Pain in left hip: Secondary | ICD-10-CM | POA: Insufficient documentation

## 2015-07-23 NOTE — Therapy (Signed)
[place] PHYSICAL AND SPORTS MEDICINE [address] [place], [address] Phone: [phone]   Fax:  [phone]  Physical Therapy Treatment/Discharge  Patient Details  Name: Lisa [name] MRN: [id number] Date of Birth: 1976-01-15 Referring Provider: Derrel Nip  Encounter Date: 07/16/2015    Past Medical History  Diagnosis Date  . Complication of anesthesia   . PONV (postoperative nausea and vomiting)   . Family history of anesthesia complication     "morphine night terrors"  . Dysrhythmia     "does not see cardiologist"  . Shortness of breath     "wheezing"  . Bronchitis     hx of  . GERD (gastroesophageal reflux disease)   . Headache(784.0)     "migraines"  . Hypertension     Past Surgical History  Procedure Laterality Date  . Cesarean section      x 2  . Widom extractions    . Tongue flap release    . Microlaryngoscopy with laser and balloon dilation N/A 07/20/2012    Procedure: MICROLARYNGOSCOPY WITH LASER AND BALLOON DILATION;  Surgeon: Melida Quitter, MD;  Location: [place];  Service: ENT;  Laterality: N/A;  WITH JET VENTURI VENTILATION    There were no vitals filed for this visit.  Visit Diagnosis:  Left hip pain        At this time pt is appropriate for d/c. She is able to manage/monitor her pain on her own. Goals are not met consistently but when pt is not working greater than 50 hrs per week she is able to keep pain well under control and pain does abolish with consistent application of HEP.                            PT Long Term Goals - 06/03/15 1012    PT LONG TERM GOAL #1   Title Patient will report decreased pain with functional activities. <2/10   Baseline 2/10   Time 4   Period Weeks   Status Achieved   PT LONG TERM GOAL #2   Title Patient will have improvement on Oswestry of < 26%   Baseline 26%   Time 4   Period Weeks   Status Deferred   PT LONG TERM GOAL #3   Title Pt  will be able to perform 20 wall squats independently to possess the strength neessary for ADLs   Baseline 5   Time 4   Period Weeks   Status New   PT LONG TERM GOAL #4   Title Pt will work entire shift with <3/10 pain by the end of the shift.   Baseline 6/10   Time 4   Period Weeks   Status New               Problem List Patient Active Problem List   Diagnosis Date Noted  . Left-sided low back pain with left-sided sciatica 04/28/2015  . Obesity 04/28/2015  . Pre-syncope 07/14/2014    Fisher,Benjamin PT DPT 07/23/2015, 9:56 AM  [place] PHYSICAL AND SPORTS MEDICINE [address] [place], [address] Phone: [phone]   Fax:  [phone]  Name: Lisa [name] MRN: [id number] Date of Birth: [birth year]

## 2015-11-23 ENCOUNTER — Encounter: Payer: Self-pay | Admitting: Internal Medicine

## 2016-02-08 ENCOUNTER — Telehealth: Payer: Self-pay | Admitting: Internal Medicine

## 2016-02-08 NOTE — Telephone Encounter (Signed)
PT called and scheduled a physical for 12/13 and would like to have script for labs before. Thank you!  Call pt @ [phone] (M)

## 2016-02-08 NOTE — Telephone Encounter (Signed)
Per- DPR a voicemail was left stating she needed a appointment before labs.

## 2016-02-08 NOTE — Telephone Encounter (Signed)
prefers after visit.

## 2016-03-01 ENCOUNTER — Ambulatory Visit: Payer: 59 | Admitting: Obstetrics and Gynecology

## 2016-03-10 ENCOUNTER — Ambulatory Visit: Payer: 59 | Admitting: Obstetrics and Gynecology

## 2016-03-16 ENCOUNTER — Other Ambulatory Visit: Payer: Self-pay | Admitting: Internal Medicine

## 2016-03-24 ENCOUNTER — Ambulatory Visit: Payer: 59 | Admitting: Obstetrics and Gynecology

## 2016-03-24 ENCOUNTER — Encounter: Payer: Self-pay | Admitting: Obstetrics and Gynecology

## 2016-03-24 ENCOUNTER — Ambulatory Visit (INDEPENDENT_AMBULATORY_CARE_PROVIDER_SITE_OTHER): Payer: 59 | Admitting: Obstetrics and Gynecology

## 2016-03-24 VITALS — BP 118/70 | HR 79 | Wt 270.0 lb

## 2016-03-24 DIAGNOSIS — Z3046 Encounter for surveillance of implantable subdermal contraceptive: Secondary | ICD-10-CM | POA: Diagnosis not present

## 2016-03-24 DIAGNOSIS — Z30017 Encounter for initial prescription of implantable subdermal contraceptive: Secondary | ICD-10-CM

## 2016-03-28 NOTE — Progress Notes (Signed)
     GYNECOLOGY CLINIC PROCEDURE NOTE  Lisa [name] is a 40 y.o. female here for Nexplanon removal and re-insertion.  Last pap smear was on 03/2013 and was normal.  No other gynecologic concerns.   Nexplanon Removal and Insertion  Patient identified, informed consent performed.   Patient does understand that irregular bleeding is a very common side effect of this medication. She was advised to have backup contraception for one week after replacement of the implant. Pregnancy test in clinic today was negative.  Appropriate time out taken. Implanon site identified. Area prepped in usual sterile fashon. One ml of 1% lidocaine was used to anesthetize the area at the distal end of the implant. A small stab incision was made right beside the implant on the distal portion. The Nexplanon rod was grasped using hemostats and removed without difficulty. There was minimal blood loss. There were no complications. Area was then injected with 3 ml of 1 % lidocaine. She was re-prepped with betadine, Nexplanon removed from packaging, Device confirmed in needle, then inserted full length of needle and withdrawn per handbook instructions. Nexplanon was able to palpated in the patient's arm; patient palpated the insert herself.  There was minimal blood loss. Patient insertion site covered with guaze and a pressure bandage to reduce any bruising. The patient tolerated the procedure well and was given post procedure instructions.  She was advised to have backup contraception for one week.     Hildred LaserAnika Gordie Belvin, MD [facility]

## 2016-03-30 ENCOUNTER — Telehealth: Payer: Self-pay | Admitting: Internal Medicine

## 2016-03-30 DIAGNOSIS — R5383 Other fatigue: Secondary | ICD-10-CM

## 2016-03-30 DIAGNOSIS — E669 Obesity, unspecified: Secondary | ICD-10-CM

## 2016-03-30 DIAGNOSIS — E785 Hyperlipidemia, unspecified: Secondary | ICD-10-CM

## 2016-03-30 NOTE — Telephone Encounter (Signed)
I pended fasting labs .

## 2016-03-30 NOTE — Telephone Encounter (Signed)
Pt called and is requesting labs be ordered prior to her appt on 12/13. Please advise, thank you!

## 2016-04-12 ENCOUNTER — Encounter: Payer: Self-pay | Admitting: Physician Assistant

## 2016-04-12 ENCOUNTER — Ambulatory Visit: Payer: Self-pay | Admitting: Physician Assistant

## 2016-04-12 VITALS — BP 118/74 | HR 84 | Temp 98.4°F

## 2016-04-12 DIAGNOSIS — M722 Plantar fascial fibromatosis: Secondary | ICD-10-CM | POA: Diagnosis not present

## 2016-04-12 DIAGNOSIS — M79672 Pain in left foot: Secondary | ICD-10-CM | POA: Diagnosis not present

## 2016-04-12 DIAGNOSIS — J069 Acute upper respiratory infection, unspecified: Secondary | ICD-10-CM

## 2016-04-12 MED ORDER — METHYLPREDNISOLONE 4 MG PO TBPK: ORAL_TABLET | ORAL | 0 refills | Status: DC

## 2016-04-12 MED ORDER — ALBUTEROL SULFATE HFA 108 (90 BASE) MCG/ACT IN AERS: 2.0000 | INHALATION_SPRAY | Freq: Four times a day (QID) | RESPIRATORY_TRACT | 0 refills | Status: DC | PRN

## 2016-04-12 NOTE — Progress Notes (Signed)
S: C/o runny nose and congestion for 2 days, no fever, chills, cp/sob, v/d; mucus was clear throughout the day, cough is sporadic, had a little wheezing and chest burns, used her child's inhaler which helped  Using otc meds:   O: PE: vitals wnl, nad, perrl eomi, normocephalic, tms dull, nasal mucosa red and swollen, throat injected, neck supple no lymph, lungs c t a, cv rrr, neuro intact  A:  Acute viral uri   P: drink fluids, continue regular meds , use otc meds of choice, return if not improving in 5 days, return earlier if worsening. Medrol dose pack, albuterol inhaler, if becoming worse will call in an antibiotic

## 2016-04-15 MED ORDER — FLUCONAZOLE 150 MG PO TABS: 150.0000 mg | ORAL_TABLET | Freq: Once | ORAL | 0 refills | Status: AC

## 2016-04-15 MED ORDER — CEFDINIR 300 MG PO CAPS: 300.0000 mg | ORAL_CAPSULE | Freq: Two times a day (BID) | ORAL | 0 refills | Status: DC

## 2016-04-15 NOTE — Telephone Encounter (Signed)
Patient called back stating that she was speaking to a respiratory therapist who inform her that her coughing was worse. So patient stated to me that she thought she made need a breathing treatment feeling tight ion chest. I informed her that she might need to go to ED or Urgent care and not wait. Also informed her that Truman HaywardOmnicef was  E-scripted over to pharmacy and Darl PikesSusan stated the same if she feel tight in chest

## 2016-04-15 NOTE — Telephone Encounter (Signed)
Pt called office stating she is not better, called in omnicef and diflucan , pt to only use diflucan if she gets yeast from the antibiotic, return to clinic if not improved in 3-5 days

## 2016-04-20 ENCOUNTER — Other Ambulatory Visit (INDEPENDENT_AMBULATORY_CARE_PROVIDER_SITE_OTHER): Payer: 59

## 2016-04-20 DIAGNOSIS — R7989 Other specified abnormal findings of blood chemistry: Secondary | ICD-10-CM | POA: Diagnosis not present

## 2016-04-20 DIAGNOSIS — R5383 Other fatigue: Secondary | ICD-10-CM

## 2016-04-20 DIAGNOSIS — E785 Hyperlipidemia, unspecified: Secondary | ICD-10-CM | POA: Diagnosis not present

## 2016-04-20 DIAGNOSIS — E669 Obesity, unspecified: Secondary | ICD-10-CM

## 2016-04-20 LAB — CBC WITH DIFFERENTIAL/PLATELET
BASOS ABS: 0 10*3/uL (ref 0.0–0.1)
Basophils Relative: 0.6 % (ref 0.0–3.0)
EOS PCT: 1.4 % (ref 0.0–5.0)
Eosinophils Absolute: 0.1 10*3/uL (ref 0.0–0.7)
HEMATOCRIT: 41.8 % (ref 36.0–46.0)
Hemoglobin: 14.1 g/dL (ref 12.0–15.0)
LYMPHS ABS: 1.9 10*3/uL (ref 0.7–4.0)
LYMPHS PCT: 23.9 % (ref 12.0–46.0)
MCHC: 33.7 g/dL (ref 30.0–36.0)
MCV: 83.6 fl (ref 78.0–100.0)
MONOS PCT: 4.2 % (ref 3.0–12.0)
Monocytes Absolute: 0.3 10*3/uL (ref 0.1–1.0)
NEUTROS ABS: 5.6 10*3/uL (ref 1.4–7.7)
NEUTROS PCT: 69.9 % (ref 43.0–77.0)
Platelets: 250 10*3/uL (ref 150.0–400.0)
RBC: 5 Mil/uL (ref 3.87–5.11)
RDW: 13.3 % (ref 11.5–15.5)
WBC: 8.1 10*3/uL (ref 4.0–10.5)

## 2016-04-20 LAB — COMPREHENSIVE METABOLIC PANEL
ALBUMIN: 3.9 g/dL (ref 3.5–5.2)
ALK PHOS: 52 U/L (ref 39–117)
ALT: 15 U/L (ref 0–35)
AST: 13 U/L (ref 0–37)
BILIRUBIN TOTAL: 0.6 mg/dL (ref 0.2–1.2)
BUN: 11 mg/dL (ref 6–23)
CALCIUM: 9.2 mg/dL (ref 8.4–10.5)
CO2: 25 mEq/L (ref 19–32)
Chloride: 106 mEq/L (ref 96–112)
Creatinine, Ser: 0.71 mg/dL (ref 0.40–1.20)
GFR: 97.26 mL/min (ref >60.00)
Glucose, Bld: 91 mg/dL (ref 70–99)
POTASSIUM: 4.2 meq/L (ref 3.5–5.1)
Sodium: 139 mEq/L (ref 135–145)
TOTAL PROTEIN: 6.6 g/dL (ref 6.0–8.3)

## 2016-04-20 LAB — LIPID PANEL
CHOLESTEROL: 157 mg/dL (ref 0–200)
HDL: 37 mg/dL — AB (ref >39.00)
NonHDL: 119.75
TRIGLYCERIDES: 240 mg/dL — AB (ref 0.0–149.0)
Total CHOL/HDL Ratio: 4
VLDL: 48 mg/dL — AB (ref 0.0–40.0)

## 2016-04-20 LAB — LDL CHOLESTEROL, DIRECT: Direct LDL: 72 mg/dL

## 2016-04-20 LAB — TSH: TSH: 1.74 u[IU]/mL (ref 0.35–4.50)

## 2016-04-23 ENCOUNTER — Encounter: Payer: Self-pay | Admitting: Internal Medicine

## 2016-04-27 ENCOUNTER — Other Ambulatory Visit (HOSPITAL_COMMUNITY)
Admission: RE | Admit: 2016-04-27 | Discharge: 2016-04-27 | Disposition: A | Discharge status: Home / Self Care | Payer: 59 | Source: Ambulatory Visit | Attending: Internal Medicine | Admitting: Internal Medicine

## 2016-04-27 ENCOUNTER — Ambulatory Visit (INDEPENDENT_AMBULATORY_CARE_PROVIDER_SITE_OTHER): Payer: 59 | Admitting: Internal Medicine

## 2016-04-27 VITALS — BP 136/84 | HR 88 | Temp 97.6°F | Resp 12 | Ht 64.0 in | Wt 270.8 lb

## 2016-04-27 DIAGNOSIS — E669 Obesity, unspecified: Secondary | ICD-10-CM

## 2016-04-27 DIAGNOSIS — Z0000 Encounter for general adult medical examination without abnormal findings: Secondary | ICD-10-CM

## 2016-04-27 DIAGNOSIS — Z1151 Encounter for screening for human papillomavirus (HPV): Secondary | ICD-10-CM | POA: Diagnosis not present

## 2016-04-27 DIAGNOSIS — Z01419 Encounter for gynecological examination (general) (routine) without abnormal findings: Secondary | ICD-10-CM | POA: Insufficient documentation

## 2016-04-27 DIAGNOSIS — Z1239 Encounter for other screening for malignant neoplasm of breast: Secondary | ICD-10-CM

## 2016-04-27 DIAGNOSIS — Z1231 Encounter for screening mammogram for malignant neoplasm of breast: Secondary | ICD-10-CM

## 2016-04-27 DIAGNOSIS — Z124 Encounter for screening for malignant neoplasm of cervix: Secondary | ICD-10-CM

## 2016-04-27 DIAGNOSIS — Z23 Encounter for immunization: Secondary | ICD-10-CM

## 2016-04-27 NOTE — Progress Notes (Signed)
Patient ID: Lisa [name], female    DOB: May 15, 1976  Age: 40 y.o. MRN: [id number]  The patient is here for annual  wellness examination and management of other chronic and acute problems.   the risk factors are reflected in the social history.  The roster of all physicians providing medical care to patient - is listed in the Snapshot section of the chart.  Home safety : The patient has smoke detectors in the home. They wear seatbelts.  There are no firearms at home. There is no violence in the home.   There is no risks for hepatitis, STDs or HIV. There is no   history of blood transfusion. They have no travel history to infectious disease endemic areas of the world.  The patient has seen their dentist in the last six month. They have seen their eye doctor in the last year.   They do not  have excessive sun exposure. Discussed the need for sun protection: hats, long sleeves and use of sunscreen if there is significant sun exposure.   Diet: the importance of a healthy diet is discussed. They do have a healthy diet.  The benefits of regular aerobic exercise were discussed. She walks 4 times per week ,  20 minutes.   Depression screen: there are no signs or vegative symptoms of depression- irritability, change in appetite, anhedonia, sadness/tearfullness.  The following portions of the patient's history were reviewed and updated as appropriate: allergies, current medications, past family history, past medical history,  past surgical history, past social history  and problem list.  Visual acuity was not assessed per patient preference since she has regular follow up with her ophthalmologist. Hearing and body mass index were assessed and reviewed.   During the course of the visit the patient was educated and counseled about appropriate screening and preventive services including : fall prevention , diabetes screening, nutrition counseling, colorectal cancer screening, and recommended  immunizations.    CC: The primary encounter diagnosis was Cervical cancer screening. Diagnoses of Breast cancer screening, Need for prophylactic vaccination with combined diphtheria-tetanus-pertussis (DTP) vaccine, Encounter for preventive health examination, and Obesity, unspecified classification, unspecified obesity type, unspecified whether serious comorbidity present were also pertinent to this visit.   Needs PAP smear .  Last one 3 years ago by [employer]  Exchanged  nexplanon for birth control  Struggling with weight . She lost  23 lbs,  Gained it back this year  [facility] for viral URI on Nov 28th ,  Given Omnicef and prednisone  Only took the prednisone  History Lisa [name] has a past medical history of Bronchitis; Complication of anesthesia; Dysrhythmia; Family history of anesthesia complication; GERD (gastroesophageal reflux disease); Headache(784.0); Hypertension; PONV (postoperative nausea and vomiting); and Shortness of breath.   She has a past surgical history that includes Cesarean section; widom extractions; Tongue flap release; and Microlaryngoscopy with laser and balloon dilation (N/A, 07/20/2012).   Her family history includes Arthritis in her mother; Hyperlipidemia in her father and mother; Hypertension in her father and mother.She reports that she has never smoked. She does not have any smokeless tobacco history on file. She reports that she drinks alcohol. She reports that she does not use drugs.  Outpatient Medications Prior to Visit  Medication Sig Dispense Refill  . albuterol (PROVENTIL HFA;VENTOLIN HFA) 108 (90 Base) MCG/ACT inhaler Inhale 2 puffs into the lungs every 6 (six) hours as needed for wheezing or shortness of breath. 1 Inhaler 0  . cetirizine (ZYRTEC) 10  MG tablet Take 10 mg by mouth daily.    Marland Kitchen. etonogestrel (NEXPLANON) 68 MG IMPL implant 1 each by Subdermal route once.    Marland Kitchen. omeprazole (PRILOSEC) 40 MG capsule TAKE 1 CAPSULE (40 MG TOTAL) BY MOUTH  DAILY. 90 capsule 0  . cefdinir (OMNICEF) 300 MG capsule Take 1 capsule (300 mg total) by mouth 2 (two) times daily. (Patient not taking: Reported on 04/27/2016) 20 capsule 0  . methylPREDNISolone (MEDROL DOSEPAK) 4 MG TBPK tablet Take 6 pills on day one then decrease by 1 pill each day (Patient not taking: Reported on 04/27/2016) 21 tablet 0  . predniSONE (DELTASONE) 10 MG tablet 6 tablets on Day 1 , then reduce by 1 tablet daily until gone (Patient not taking: Reported on 04/27/2016) 21 tablet 0   No facility-administered medications prior to visit.     Review of Systems   Patient denies headache, fevers, malaise, unintentional weight loss, skin rash, eye pain, sinus congestion and sinus pain, sore throat, dysphagia,  hemoptysis , cough, dyspnea, wheezing, chest pain, palpitations, orthopnea, edema, abdominal pain, nausea, melena, diarrhea, constipation, flank pain, dysuria, hematuria, urinary  Frequency, nocturia, numbness, tingling, seizures,  Focal weakness, Loss of consciousness,  Tremor, insomnia, depression, anxiety, and suicidal ideation.      Objective:  BP 136/84   Pulse 88   Temp 97.6 F (36.4 C) (Oral)   Resp 12   Ht 5\' 4"  (1.626 m)   Wt 270 lb 12 oz (122.8 kg)   SpO2 98%   BMI 46.47 kg/m   Physical Exam   General Appearance:    Alert, cooperative, no distress, appears stated age  Head:    Normocephalic, without obvious abnormality, atraumatic  Eyes:    PERRL, conjunctiva/corneas clear, EOM's intact, fundi    benign, both eyes  Ears:    Normal TM's and external ear canals, both ears  Nose:   Nares normal, septum midline, mucosa normal, no drainage    or sinus tenderness  Throat:   Lips, mucosa, and tongue normal; teeth and gums normal  Neck:   Supple, symmetrical, trachea midline, no adenopathy;    thyroid:  no enlargement/tenderness/nodules; no carotid   bruit or JVD  Back:     Symmetric, no curvature, ROM normal, no CVA tenderness  Lungs:     Clear to  auscultation bilaterally, respirations unlabored  Chest Wall:    No tenderness or deformity   Heart:    Regular rate and rhythm, S1 and S2 normal, no murmur, rub   or gallop  Breast Exam:    No tenderness, masses, or nipple abnormality  Abdomen:     Soft, non-tender, bowel sounds active all four quadrants,    no masses, no organomegaly  Genitalia:    Pelvic: cervix normal in appearance, external genitalia normal, no adnexal masses or tenderness, no cervical motion tenderness, rectovaginal septum normal, uterus normal size, shape, and consistency and vagina normal without discharge  Extremities:   Extremities normal, atraumatic, no cyanosis or edema  Pulses:   2+ and symmetric all extremities  Skin:   Skin color, texture, turgor normal, no rashes or lesions  Lymph nodes:   Cervical, supraclavicular, and axillary nodes normal  Neurologic:   CNII-XII intact, normal strength, sensation and reflexes    throughout       Assessment & Plan:   Problem List Items Addressed This Visit    Encounter for preventive health examination    Annual comprehensive preventive exam was done as well as  an evaluation and management of chronic conditions .  During the course of the visit the patient was educated and counseled about appropriate screening and preventive services including :  diabetes screening,  , nutrition counseling,  and recommended immunizations.  Printed recommendations for health maintenance screenings was given.        Obesity    I have addressed  BMI and recommended a low glycemic index diet utilizing smaller more frequent meals to increase metabolism.  I have also recommended that patient start exercising with a goal of 30 minutes of aerobic exercise a minimum of 5 days per week. Screening for lipid disorders, thyroid and diabetes to be done today.  Lab Results  Component Value Date   TSH 1.74 04/20/2016   Lab Results  Component Value Date   HGBA1C 5.8 (H) 10/29/2014   Lab Results   Component Value Date   ALT 15 04/20/2016   AST 13 04/20/2016   ALKPHOS 52 04/20/2016   BILITOT 0.6 04/20/2016   Lab Results  Component Value Date   CREATININE 0.71 04/20/2016          Other Visit Diagnoses    Cervical cancer screening    -  Primary   Relevant Orders   Cytology - PAP (Completed)   Breast cancer screening       Relevant Orders   MM SCREENING BREAST TOMO BILATERAL   Need for prophylactic vaccination with combined diphtheria-tetanus-pertussis (DTP) vaccine       Relevant Orders   Tdap vaccine greater than or equal to 7yo IM (Completed)      I am having Lisa [name] maintain her etonogestrel, predniSONE, omeprazole, cetirizine, methylPREDNISolone, albuterol, and cefdinir.  No orders of the defined types were placed in this encounter.   There are no discontinued medications.  Follow-up: No Follow-up on file.   Sherlene Shams, MD

## 2016-04-27 NOTE — Progress Notes (Signed)
Pre-visit discussion using our clinic review tool. No additional management support is needed unless otherwise documented below in the visit note.  

## 2016-04-27 NOTE — Patient Instructions (Signed)
   You might want to try a  Low carb , premixed protein drink  For breakfast .  Here are my top choices:  1) Premier Protein:  Nutritional analysis :  160 cal  30 g protein  1 g sugar 50% calcium needs   Nicolette BangWal Mart and [employer]  2) Atkins advantage  3) EAS Carb Control    Vilma MeckelJimmy Dean now makes a frozen breakfast frittata that can be microwaved in 2 minutes and is very low carb.     To make a low carb chip :  Take the Joseph's Lavash or Pita bread,  Or the Mission Low carb whole wheat tortilla   Place on metal cookie sheet  Brush with olive oil  Sprinkle garlic powder (NOT garlic salt), grated parmesan cheese, mediterranean seasoning , or all of them?  Bake at 275 for 30 minutes   We have substitutions for your potatoes!!  Try the mashed cauliflower and riced cauliflower dishes instead of rice and mashed potatoes  Mashed turnips are also very low carb!   For desserts :  Try the Dannon Lt n Fit greek yogurt dessert flavors and top with reddi Whip .  8 carbs,  80 calories  Try Oikos Triple Zero AustriaGreek Yogurt in the salted caramel, and the coffee flavors  With Whipped Cream for dessert  breyer's low carb ice cream, available in bars (on a stick, better ) or scoopable ice cream  HERE ARE THE LOW CARB  BREAD CHOICES

## 2016-04-29 ENCOUNTER — Encounter: Payer: Self-pay | Admitting: Internal Medicine

## 2016-04-29 DIAGNOSIS — Z0000 Encounter for general adult medical examination without abnormal findings: Secondary | ICD-10-CM | POA: Insufficient documentation

## 2016-04-29 DIAGNOSIS — Z0001 Encounter for general adult medical examination with abnormal findings: Secondary | ICD-10-CM | POA: Insufficient documentation

## 2016-04-29 DIAGNOSIS — Z1211 Encounter for screening for malignant neoplasm of colon: Secondary | ICD-10-CM | POA: Insufficient documentation

## 2016-04-29 LAB — CYTOLOGY - PAP
Diagnosis: NEGATIVE
HPV (WINDOPATH): NOT DETECTED

## 2016-04-29 NOTE — Assessment & Plan Note (Signed)
Annual comprehensive preventive exam was done as well as an evaluation and management of chronic conditions .  During the course of the visit the patient was educated and counseled about appropriate screening and preventive services including :  diabetes screening,  , nutrition counseling,  and recommended immunizations.  Printed recommendations for health maintenance screenings was given.

## 2016-04-29 NOTE — Assessment & Plan Note (Signed)
I have addressed  BMI and recommended a low glycemic index diet utilizing smaller more frequent meals to increase metabolism.  I have also recommended that patient start exercising with a goal of 30 minutes of aerobic exercise a minimum of 5 days per week. Screening for lipid disorders, thyroid and diabetes to be done today.  Lab Results  Component Value Date   TSH 1.74 04/20/2016   Lab Results  Component Value Date   HGBA1C 5.8 (H) 10/29/2014   Lab Results  Component Value Date   ALT 15 04/20/2016   AST 13 04/20/2016   ALKPHOS 52 04/20/2016   BILITOT 0.6 04/20/2016   Lab Results  Component Value Date   CREATININE 0.71 04/20/2016

## 2016-05-23 ENCOUNTER — Telehealth: Payer: Self-pay | Admitting: Internal Medicine

## 2016-05-23 NOTE — Telephone Encounter (Signed)
Pt states she is not supposed to get the Mammo yet due to fact of not being 41 yet. Please advise? Pt would like to get her pap results. Thank you!  Call pt @ [phone].

## 2016-05-25 NOTE — Telephone Encounter (Signed)
Pap smear results were sent via mychart on dec 15   Normal .  Baseline mammogram is recommended once between 36 and 40,  If she has not had that and wants to wait, that is her choice.

## 2016-05-26 NOTE — Telephone Encounter (Signed)
Patient notified

## 2016-06-15 ENCOUNTER — Encounter: Payer: 59 | Admitting: Obstetrics and Gynecology

## 2016-06-20 ENCOUNTER — Encounter: Payer: Self-pay | Admitting: Internal Medicine

## 2016-06-21 ENCOUNTER — Other Ambulatory Visit: Payer: Self-pay | Admitting: Internal Medicine

## 2016-06-21 ENCOUNTER — Encounter: Payer: Self-pay | Admitting: Internal Medicine

## 2016-06-21 DIAGNOSIS — J386 Stenosis of larynx: Secondary | ICD-10-CM | POA: Insufficient documentation

## 2016-06-21 MED ORDER — OMEPRAZOLE 40 MG PO CPDR: 40.0000 mg | DELAYED_RELEASE_CAPSULE | Freq: Two times a day (BID) | ORAL | 0 refills | Status: DC

## 2016-06-27 ENCOUNTER — Encounter: Payer: Self-pay | Admitting: Physician Assistant

## 2016-06-27 ENCOUNTER — Ambulatory Visit: Payer: Self-pay | Admitting: Physician Assistant

## 2016-06-27 VITALS — BP 106/80 | HR 88 | Temp 98.6°F

## 2016-06-27 DIAGNOSIS — R319 Hematuria, unspecified: Principal | ICD-10-CM

## 2016-06-27 DIAGNOSIS — N390 Urinary tract infection, site not specified: Secondary | ICD-10-CM

## 2016-06-27 LAB — POCT URINALYSIS DIPSTICK
Bilirubin, UA: NEGATIVE
KETONES UA: NEGATIVE
Nitrite, UA: POSITIVE
SPEC GRAV UA: 1.01
Urobilinogen, UA: 1
pH, UA: 6

## 2016-06-27 MED ORDER — CIPROFLOXACIN HCL 250 MG PO TABS: 250.0000 mg | ORAL_TABLET | Freq: Two times a day (BID) | ORAL | 0 refills | Status: DC

## 2016-06-27 MED ORDER — PHENAZOPYRIDINE HCL 200 MG PO TABS: 200.0000 mg | ORAL_TABLET | Freq: Three times a day (TID) | ORAL | 0 refills | Status: DC | PRN

## 2016-06-27 NOTE — Progress Notes (Signed)
S:  C/o uti sx for 2 days, burning, urgency, frequency, denies vaginal discharge, abdominal pain or flank pain:  Remainder ros neg, does have some nausea  O:  Vitals wnl, nad, no cva tenderness, back nontender, lungs c t a,cv rrr, abd soft nontender, bs normal, n/v intact, ua 2+ blood, +nitrites, 3+ leuks  A: uti  P: cipro 250mg  bid x 7d, pyridium, increase water intake, add cranberry juice, return if not improving in 2 -3 days, return earlier if worsening, discussed pyelonephritis sx

## 2016-06-30 ENCOUNTER — Other Ambulatory Visit
Admission: RE | Admit: 2016-06-30 | Discharge: 2016-06-30 | Disposition: A | Discharge status: Home / Self Care | Payer: 59 | Source: Ambulatory Visit | Attending: Physician Assistant | Admitting: Physician Assistant

## 2016-06-30 ENCOUNTER — Ambulatory Visit: Payer: Self-pay | Admitting: Physician Assistant

## 2016-06-30 VITALS — BP 120/82 | HR 92 | Temp 98.3°F

## 2016-06-30 DIAGNOSIS — N390 Urinary tract infection, site not specified: Secondary | ICD-10-CM | POA: Diagnosis not present

## 2016-06-30 DIAGNOSIS — R300 Dysuria: Secondary | ICD-10-CM

## 2016-06-30 LAB — POCT URINALYSIS DIPSTICK
Bilirubin, UA: NEGATIVE
Nitrite, UA: POSITIVE
RBC UA: NEGATIVE
UROBILINOGEN UA: 2
pH, UA: 5

## 2016-06-30 MED ORDER — SULFAMETHOXAZOLE-TRIMETHOPRIM 800-160 MG PO TABS: 1.0000 | ORAL_TABLET | Freq: Two times a day (BID) | ORAL | 0 refills | Status: DC

## 2016-06-30 NOTE — Progress Notes (Signed)
S: states she doesn't feel any better from uti since taking cipro, some fever/chills, some nausea this am, no abd or flank pain, no v/d  O: vitals wnl, nad, no cva tenderness, lungs c t a, cv rrr, ua 3+ leuks, +nitrites, + blood  A: resistant uti  P: stop cipro, use septra ds, sent urine to lab for culture

## 2016-07-01 ENCOUNTER — Telehealth: Payer: Self-pay | Admitting: Physician Assistant

## 2016-07-01 ENCOUNTER — Encounter: Payer: Self-pay | Admitting: Physician Assistant

## 2016-07-01 ENCOUNTER — Telehealth: Payer: Self-pay | Admitting: Internal Medicine

## 2016-07-01 ENCOUNTER — Telehealth: Payer: Self-pay | Admitting: Urology

## 2016-07-01 ENCOUNTER — Ambulatory Visit: Payer: 59 | Admitting: Urology

## 2016-07-01 LAB — URINE CULTURE

## 2016-07-01 NOTE — Telephone Encounter (Signed)
Stay on Septra, if she is worsening we can send her to [facility] for rocephin iV

## 2016-07-01 NOTE — Telephone Encounter (Signed)
Pt called and stated that the Employee health stated that pt needs a Rocephin infusion. They have been treating her for the last5 days for bladder infection and with no resolves. She is having abdominal pain. Please advise, thank you!  Call pt @ [phone]

## 2016-07-01 NOTE — Telephone Encounter (Signed)
Can this be done in office? Can she have today?

## 2016-07-01 NOTE — Telephone Encounter (Signed)
NO, she cannot because we have no verificati nof what she is saying,  She needs to follow up with [facility]

## 2016-07-01 NOTE — Telephone Encounter (Signed)
Reason for call: UTI  Symptoms: urinary pressure, abdomen full distended, no frequent urination, urine clear, urine culture sent yesterday, no fever, chills, feels bad,  Flu like symptoms  Duration Monday Medications: Cipro Monday- Wednesday, then [facility]  Last seen for this problem: Seen by: [facility]  Patient wants to know if she can come in for Rocephin injection today, she has left messages for [facility] however they have failed to respond.  Please advise.

## 2016-07-01 NOTE — Telephone Encounter (Signed)
Patient advised of below.   States she just spoke with [facility] and she is on correct antibiotic.

## 2016-07-01 NOTE — Progress Notes (Signed)
Patient was contacted and informed what had grew out of specimen and the Septra given takes a couple of days to work. But  If she wants Darl PikesSusan will send an order for IV. Per patient will wait til Monday and give us a call back

## 2016-07-01 NOTE — Telephone Encounter (Signed)
Patient referred for further revaluation of UTI. Her symptoms began on Monday with abdominal pain and bloating. She had a urinalysis which is frankly positive including nitrate and leukocyte esterase. She was started on Cipro but failed to improve. She returned to her PCP yesterday with ongoing symptoms. Her urine remained slightly positive and urine culture is now growing lactobacillus. Her antibiotic was switched to Bactrim and she has had less than 24 hours of this antibiotic.  She does feel like she is emptying her bladder completely. No gross hematuria.  She denies any fevers, chills, flank pain, or history of kidney stones. Previous KUBs and spinal films from 2016 reviewed. No stones appreciated.  I have offered her to be evaluated here in the clinic this afternoon. I advised her that it can take some time for symptoms to resolve especially if she was on the wrong antibiotic initially. Encouraged her to continue monitor symptoms and presented to the emergency room if she has any nausea, vomiting, flank pain, or fevers higher than 100.  She will call our office on Monday she continues to have symptoms for further evaluation.  Vanna ScotlandAshley Zyliah Schier, MD

## 2016-07-04 ENCOUNTER — Encounter: Payer: Self-pay | Admitting: Urology

## 2016-10-24 ENCOUNTER — Encounter: Payer: Self-pay | Admitting: Physician Assistant

## 2016-10-24 ENCOUNTER — Ambulatory Visit: Payer: Self-pay | Admitting: Physician Assistant

## 2016-10-24 VITALS — BP 110/80 | HR 87 | Temp 98.7°F

## 2016-10-24 DIAGNOSIS — J069 Acute upper respiratory infection, unspecified: Secondary | ICD-10-CM

## 2016-10-24 DIAGNOSIS — S30861A Insect bite (nonvenomous) of abdominal wall, initial encounter: Secondary | ICD-10-CM

## 2016-10-24 DIAGNOSIS — W57XXXA Bitten or stung by nonvenomous insect and other nonvenomous arthropods, initial encounter: Principal | ICD-10-CM

## 2016-10-24 MED ORDER — DOXYCYCLINE HYCLATE 100 MG PO TABS: 100.0000 mg | ORAL_TABLET | Freq: Two times a day (BID) | ORAL | 0 refills | Status: DC

## 2016-10-24 NOTE — Progress Notes (Signed)
S: C/o runny nose and congestion for 3 days, sx on and off for about a month, kids are sick, also had a tick bite about a week ago and the area still itches;  no fever, chills, cp/sob, v/d; mucus was green this am , does have joint pain and headache  Using otc meds: ibuprofen  O: PE: vitals wnl, nad, perrl eomi, normocephalic, tms dull, nasal mucosa red and swollen, throat injected, neck supple no lymph, lungs c t a, cv rrr, neuro intact  A:  Acute uri, tick bite   P: drink fluids, continue regular meds , use otc meds of choice, return if not improving in 5 days, return earlier if worsening , doxy 100mg  bid x 12d

## 2016-11-04 ENCOUNTER — Encounter: Payer: Self-pay | Admitting: Internal Medicine

## 2016-11-08 DIAGNOSIS — J386 Stenosis of larynx: Secondary | ICD-10-CM | POA: Diagnosis not present

## 2017-01-02 ENCOUNTER — Other Ambulatory Visit: Payer: Self-pay | Admitting: Internal Medicine

## 2017-01-11 ENCOUNTER — Encounter: Payer: Self-pay | Admitting: Internal Medicine

## 2017-01-11 DIAGNOSIS — Z1239 Encounter for other screening for malignant neoplasm of breast: Secondary | ICD-10-CM

## 2017-01-25 ENCOUNTER — Encounter: Payer: Self-pay | Admitting: Internal Medicine

## 2017-01-26 ENCOUNTER — Other Ambulatory Visit: Payer: Self-pay | Admitting: Internal Medicine

## 2017-01-26 MED ORDER — OMEPRAZOLE 40 MG PO CPDR: 40.0000 mg | DELAYED_RELEASE_CAPSULE | Freq: Every day | ORAL | 5 refills | Status: DC

## 2017-02-23 ENCOUNTER — Ambulatory Visit
Admission: RE | Admit: 2017-02-23 | Discharge: 2017-02-23 | Disposition: A | Discharge status: Home / Self Care | Payer: 59 | Source: Ambulatory Visit | Attending: Internal Medicine | Admitting: Internal Medicine

## 2017-02-23 DIAGNOSIS — Z1231 Encounter for screening mammogram for malignant neoplasm of breast: Secondary | ICD-10-CM | POA: Diagnosis not present

## 2017-02-23 DIAGNOSIS — Z1239 Encounter for other screening for malignant neoplasm of breast: Secondary | ICD-10-CM

## 2017-05-01 ENCOUNTER — Ambulatory Visit (INDEPENDENT_AMBULATORY_CARE_PROVIDER_SITE_OTHER): Payer: 59 | Admitting: Internal Medicine

## 2017-05-01 ENCOUNTER — Encounter: Payer: Self-pay | Admitting: Internal Medicine

## 2017-05-01 VITALS — BP 116/62 | HR 69 | Temp 98.2°F | Resp 15 | Ht 64.0 in | Wt 289.8 lb

## 2017-05-01 DIAGNOSIS — L309 Dermatitis, unspecified: Secondary | ICD-10-CM

## 2017-05-01 DIAGNOSIS — Z114 Encounter for screening for human immunodeficiency virus [HIV]: Secondary | ICD-10-CM | POA: Diagnosis not present

## 2017-05-01 DIAGNOSIS — E119 Type 2 diabetes mellitus without complications: Secondary | ICD-10-CM | POA: Diagnosis not present

## 2017-05-01 DIAGNOSIS — E669 Obesity, unspecified: Secondary | ICD-10-CM | POA: Diagnosis not present

## 2017-05-01 DIAGNOSIS — R635 Abnormal weight gain: Secondary | ICD-10-CM | POA: Diagnosis not present

## 2017-05-01 DIAGNOSIS — Z0000 Encounter for general adult medical examination without abnormal findings: Secondary | ICD-10-CM | POA: Diagnosis not present

## 2017-05-01 DIAGNOSIS — R7303 Prediabetes: Secondary | ICD-10-CM | POA: Diagnosis not present

## 2017-05-01 DIAGNOSIS — J386 Stenosis of larynx: Secondary | ICD-10-CM | POA: Diagnosis not present

## 2017-05-01 LAB — COMPREHENSIVE METABOLIC PANEL
ALT: 34 U/L (ref 0–35)
AST: 21 U/L (ref 0–37)
Albumin: 4.1 g/dL (ref 3.5–5.2)
Alkaline Phosphatase: 46 U/L (ref 39–117)
BILIRUBIN TOTAL: 0.6 mg/dL (ref 0.2–1.2)
BUN: 17 mg/dL (ref 6–23)
CHLORIDE: 106 meq/L (ref 96–112)
CO2: 23 meq/L (ref 19–32)
Calcium: 9.4 mg/dL (ref 8.4–10.5)
Creatinine, Ser: 0.67 mg/dL (ref 0.40–1.20)
GFR: 103.44 mL/min (ref >60.00)
GLUCOSE: 101 mg/dL — AB (ref 70–99)
Potassium: 3.9 mEq/L (ref 3.5–5.1)
Sodium: 137 mEq/L (ref 135–145)
Total Protein: 7 g/dL (ref 6.0–8.3)

## 2017-05-01 LAB — LIPID PANEL
CHOL/HDL RATIO: 3
Cholesterol: 138 mg/dL (ref 0–200)
HDL: 40.2 mg/dL (ref >39.00)
LDL CALC: 69 mg/dL (ref 0–99)
NONHDL: 98.07
Triglycerides: 144 mg/dL (ref 0.0–149.0)
VLDL: 28.8 mg/dL (ref 0.0–40.0)

## 2017-05-01 LAB — HEMOGLOBIN A1C: HEMOGLOBIN A1C: 6.5 % (ref 4.6–6.5)

## 2017-05-01 LAB — TSH: TSH: 2.22 u[IU]/mL (ref 0.35–4.50)

## 2017-05-01 MED ORDER — DOXYCYCLINE HYCLATE 100 MG PO TABS: 100.0000 mg | ORAL_TABLET | Freq: Two times a day (BID) | ORAL | 0 refills | Status: DC

## 2017-05-01 NOTE — Patient Instructions (Signed)
Doxycycline twice daily for a week to clear up the skin rash  Please take a probiotic ( Align, Floraque or Culturelle), the generic version of one of these over the counter medications, or a PROBIOTIC BEVERAGE  (kombucha,  [place] ) Yogurt, or another dietary source) for a minimum of 3 weeks to prevent a serious antibiotic associated diarrhea  Called clostridium dificile colitis.  Taking a probiotic may also prevent vaginitis due to yeast infections and can be continued indefinitely if you feel that it improves your digestion or your elimination (bowels).   Health Maintenance, Female Adopting a healthy lifestyle and getting preventive care can go a long way to promote health and wellness. Talk with your health care provider about what schedule of regular examinations is right for you. This is a good chance for you to check in with your provider about disease prevention and staying healthy. In between checkups, there are plenty of things you can do on your own. Experts have done a lot of research about which lifestyle changes and preventive measures are most likely to keep you healthy. Ask your health care provider for more information. Weight and diet Eat a healthy diet  Be sure to include plenty of vegetables, fruits, low-fat dairy products, and lean protein.  Do not eat a lot of foods high in solid fats, added sugars, or salt.  Get regular exercise. This is one of the most important things you can do for your health. ? Most adults should exercise for at least 150 minutes each week. The exercise should increase your heart rate and make you sweat (moderate-intensity exercise). ? Most adults should also do strengthening exercises at least twice a week. This is in addition to the moderate-intensity exercise.  Maintain a healthy weight  Body mass index (BMI) is a measurement that can be used to identify possible weight  problems. It estimates body fat based on height and weight. Your health care provider can help determine your BMI and help you achieve or maintain a healthy weight.  For females 2 years of age and older: ? A BMI below 18.5 is considered underweight. ? A BMI of 18.5 to 24.9 is normal. ? A BMI of 25 to 29.9 is considered overweight. ? A BMI of 30 and above is considered obese.  Watch levels of cholesterol and blood lipids  You should start having your blood tested for lipids and cholesterol at 40 years of age, then have this test every 5 years.  You may need to have your cholesterol levels checked more often if: ? Your lipid or cholesterol levels are high. ? You are older than 40 years of age. ? You are at high risk for heart disease.  Cancer screening Lung Cancer  Lung cancer screening is recommended for adults 60-57 years old who are at high risk for lung cancer because of a history of smoking.  A yearly low-dose CT scan of the lungs is recommended for people who: ? Currently smoke. ? Have quit within the past 15 years. ? Have at least a 30-pack-year history of smoking. A pack year is smoking an average of one pack of cigarettes a day for 1 year.  Yearly screening should continue until it has been 15 years since you quit.  Yearly screening should stop if you develop a health problem that would prevent you from having lung cancer treatment.  Breast Cancer  Practice breast self-awareness. This means understanding  how your breasts normally appear and feel.  It also means doing regular breast self-exams. Let your health care provider know about any changes, no matter how small.  If you are in your 20s or 30s, you should have a clinical breast exam (CBE) by a health care provider every 1-3 years as part of a regular health exam.  If you are 40 or older, have a CBE every year. Also consider having a breast X-ray (mammogram) every year.  If you have a family history of breast  cancer, talk to your health care provider about genetic screening.  If you are at high risk for breast cancer, talk to your health care provider about having an MRI and a mammogram every year.  Breast cancer gene (BRCA) assessment is recommended for women who have family members with BRCA-related cancers. BRCA-related cancers include: ? Breast. ? Ovarian. ? Tubal. ? Peritoneal cancers.  Results of the assessment will determine the need for genetic counseling and BRCA1 and BRCA2 testing.  Cervical Cancer Your health care provider may recommend that you be screened regularly for cancer of the pelvic organs (ovaries, uterus, and vagina). This screening involves a pelvic examination, including checking for microscopic changes to the surface of your cervix (Pap test). You may be encouraged to have this screening done every 3 years, beginning at age 21.  For women ages 30-65, health care providers may recommend pelvic exams and Pap testing every 3 years, or they may recommend the Pap and pelvic exam, combined with testing for human papilloma virus (HPV), every 5 years. Some types of HPV increase your risk of cervical cancer. Testing for HPV may also be done on women of any age with unclear Pap test results.  Other health care providers may not recommend any screening for nonpregnant women who are considered low risk for pelvic cancer and who do not have symptoms. Ask your health care provider if a screening pelvic exam is right for you.  If you have had past treatment for cervical cancer or a condition that could lead to cancer, you need Pap tests and screening for cancer for at least 20 years after your treatment. If Pap tests have been discontinued, your risk factors (such as having a new sexual partner) need to be reassessed to determine if screening should resume. Some women have medical problems that increase the chance of getting cervical cancer. In these cases, your health care provider may  recommend more frequent screening and Pap tests.  Colorectal Cancer  This type of cancer can be detected and often prevented.  Routine colorectal cancer screening usually begins at 40 years of age and continues through 40 years of age.  Your health care provider may recommend screening at an earlier age if you have risk factors for colon cancer.  Your health care provider may also recommend using home test kits to check for hidden blood in the stool.  A small camera at the end of a tube can be used to examine your colon directly (sigmoidoscopy or colonoscopy). This is done to check for the earliest forms of colorectal cancer.  Routine screening usually begins at age 50.  Direct examination of the colon should be repeated every 5-10 years through 40 years of age. However, you may need to be screened more often if early forms of precancerous polyps or small growths are found.  Skin Cancer  Check your skin from head to toe regularly.  Tell your health care provider about any new moles or   changes in moles, especially if there is a change in a mole's shape or color.  Also tell your health care provider if you have a mole that is larger than the size of a pencil eraser.  Always use sunscreen. Apply sunscreen liberally and repeatedly throughout the day.  Protect yourself by wearing long sleeves, pants, a wide-brimmed hat, and sunglasses whenever you are outside.  Heart disease, diabetes, and high blood pressure  High blood pressure causes heart disease and increases the risk of stroke. High blood pressure is more likely to develop in: ? People who have blood pressure in the high end of the normal range (130-139/85-89 mm Hg). ? People who are overweight or obese. ? People who are African American.  If you are 18-39 years of age, have your blood pressure checked every 3-5 years. If you are 40 years of age or older, have your blood pressure checked every year. You should have your blood  pressure measured twice-once when you are at a hospital or clinic, and once when you are not at a hospital or clinic. Record the average of the two measurements. To check your blood pressure when you are not at a hospital or clinic, you can use: ? An automated blood pressure machine at a pharmacy. ? A home blood pressure monitor.  If you are between 55 years and 79 years old, ask your health care provider if you should take aspirin to prevent strokes.  Have regular diabetes screenings. This involves taking a blood sample to check your fasting blood sugar level. ? If you are at a normal weight and have a low risk for diabetes, have this test once every three years after 40 years of age. ? If you are overweight and have a high risk for diabetes, consider being tested at a younger age or more often. Preventing infection Hepatitis B  If you have a higher risk for hepatitis B, you should be screened for this virus. You are considered at high risk for hepatitis B if: ? You were born in a country where hepatitis B is common. Ask your health care provider which countries are considered high risk. ? Your parents were born in a high-risk country, and you have not been immunized against hepatitis B (hepatitis B vaccine). ? You have HIV or AIDS. ? You use needles to inject street drugs. ? You live with someone who has hepatitis B. ? You have had sex with someone who has hepatitis B. ? You get hemodialysis treatment. ? You take certain medicines for conditions, including cancer, organ transplantation, and autoimmune conditions.  Hepatitis C  Blood testing is recommended for: ? Everyone born from 1945 through 1965. ? Anyone with known risk factors for hepatitis C.  Sexually transmitted infections (STIs)  You should be screened for sexually transmitted infections (STIs) including gonorrhea and chlamydia if: ? You are sexually active and are younger than 40 years of age. ? You are older than 40 years  of age and your health care provider tells you that you are at risk for this type of infection. ? Your sexual activity has changed since you were last screened and you are at an increased risk for chlamydia or gonorrhea. Ask your health care provider if you are at risk.  If you do not have HIV, but are at risk, it may be recommended that you take a prescription medicine daily to prevent HIV infection. This is called pre-exposure prophylaxis (PrEP). You are considered at risk if: ?   You are sexually active and do not regularly use condoms or know the HIV status of your partner(s). ? You take drugs by injection. ? You are sexually active with a partner who has HIV.  Talk with your health care provider about whether you are at high risk of being infected with HIV. If you choose to begin PrEP, you should first be tested for HIV. You should then be tested every 3 months for as long as you are taking PrEP. Pregnancy  If you are premenopausal and you may become pregnant, ask your health care provider about preconception counseling.  If you may become pregnant, take 400 to 800 micrograms (mcg) of folic acid every day.  If you want to prevent pregnancy, talk to your health care provider about birth control (contraception). Osteoporosis and menopause  Osteoporosis is a disease in which the bones lose minerals and strength with aging. This can result in serious bone fractures. Your risk for osteoporosis can be identified using a bone density scan.  If you are 65 years of age or older, or if you are at risk for osteoporosis and fractures, ask your health care provider if you should be screened.  Ask your health care provider whether you should take a calcium or vitamin D supplement to lower your risk for osteoporosis.  Menopause may have certain physical symptoms and risks.  Hormone replacement therapy may reduce some of these symptoms and risks. Talk to your health care provider about whether hormone  replacement therapy is right for you. Follow these instructions at home:  Schedule regular health, dental, and eye exams.  Stay current with your immunizations.  Do not use any tobacco products including cigarettes, chewing tobacco, or electronic cigarettes.  If you are pregnant, do not drink alcohol.  If you are breastfeeding, limit how much and how often you drink alcohol.  Limit alcohol intake to no more than 1 drink per day for nonpregnant women. One drink equals 12 ounces of beer, 5 ounces of wine, or 1 ounces of hard liquor.  Do not use street drugs.  Do not share needles.  Ask your health care provider for help if you need support or information about quitting drugs.  Tell your health care provider if you often feel depressed.  Tell your health care provider if you have ever been abused or do not feel safe at home. This information is not intended to replace advice given to you by your health care provider. Make sure you discuss any questions you have with your health care provider. Document Released: 11/15/2010 Document Revised: 10/08/2015 Document Reviewed: 02/03/2015 Elsevier Interactive Patient Education  2018 [employer]  

## 2017-05-01 NOTE — Progress Notes (Signed)
Patient ID: Lisa [name], female    DOB: [birth year]  Age: 41 y.o. MRN: [id number]  The patient is here for annual preventive examination and management of other chronic and acute problems.  PAP 2017 MAMMOGRAM done Oct 2018    The risk factors are reflected in the social history.  The roster of all physicians providing medical care to patient - is listed in the Snapshot section of the chart.  Activities of daily living:  The patient is 100% independent in all ADLs: dressing, toileting, feeding as well as independent mobility  Home safety : The patient [name] smoke detectors in the home. They wear seatbelts.  There are no firearms at home. There is no violence in the home.   There is no risks for hepatitis, STDs or HIV. There is no   history of blood transfusion. They have no travel history to infectious disease endemic areas of the world.  The patient [name] seen their dentist in the last six month. They have seen their eye doctor in the last year.   They do not  have excessive sun exposure. Discussed the need for sun protection: hats, long sleeves and use of sunscreen if there is significant sun exposure.   Diet: the importance of a healthy diet is discussed. She does not  have a healthy diet and she [name] gained 19 lbs since Dec 2017   The benefits of regular aerobic exercise were discussed. She is not exercising .   Depression screen: there are no signs or vegative symptoms of depression- irritability, change in appetite, anhedonia, sadness/tearfullness.  The following portions of the patient's history were reviewed and updated as appropriate: allergies, current medications, past family history, past medical history,  past surgical history, past social history  and problem list.  Visual acuity was not assessed per patient preference since she [name] regular follow up with her ophthalmologist. Hearing and body mass index were assessed and reviewed.   During the course of the visit the  patient was educated and counseled about appropriate screening and preventive services including : fall prevention , diabetes screening, nutrition counseling, colorectal cancer screening, and recommended immunizations.    CC: The primary encounter diagnosis was Screening for HIV (human immunodeficiency virus). Diagnoses of Weight gain, Prediabetes, Encounter for preventive health examination, Obesity, unspecified classification, unspecified obesity type, unspecified whether serious comorbidity present, Idiopathic subglottic tracheal stenosis, Dermatitis, and Type 2 diabetes mellitus without complication, without long-term current use of insulin (HCC) were also pertinent to this visit.  Saw [place]  [facility] for hoarseness and wheezing,  with history of subglottic stenosis .  Not dilation was done  Last dilation 4 yeast ago   Obesity:  Plan discussed  Lab Results  Component Value Date   HGBA1C 6.5 05/01/2017    History Lisa [name] a past medical history of Bronchitis, Complication of anesthesia, Dysrhythmia, Family history of anesthesia complication, GERD (gastroesophageal reflux disease), Headache(784.0), Hypertension, PONV (postoperative nausea and vomiting), and Shortness of breath.   She [name] a past surgical history that includes Cesarean section; widom extractions; Tongue flap release; and Microlaryngoscopy with laser and balloon dilation (N/A, 07/20/2012).   Her family history includes Arthritis in her mother; Hyperlipidemia in her father and mother; Hypertension in her father and mother.She reports that  [name] never smoked. she [name] never used smokeless tobacco. She reports that she drinks alcohol. She reports that she does not use drugs.  Outpatient Medications Prior to Visit  Medication Sig Dispense Refill  . cetirizine (ZYRTEC) 10  MG tablet Take 10 mg by mouth daily.    Marland Kitchen. etonogestrel (NEXPLANON) 68 MG IMPL implant 1 each by Subdermal route once.    Marland Kitchen. omeprazole (PRILOSEC) 40 MG capsule Take 1  capsule (40 mg total) by mouth daily. 30 capsule 5  . albuterol (PROVENTIL HFA;VENTOLIN HFA) 108 (90 Base) MCG/ACT inhaler Inhale 2 puffs into the lungs every 6 (six) hours as needed for wheezing or shortness of breath. (Patient not taking: Reported on 05/01/2017) 1 Inhaler 0  . phenazopyridine (PYRIDIUM) 200 MG tablet Take 1 tablet (200 mg total) by mouth 3 (three) times daily as needed for pain. (Patient not taking: Reported on 10/24/2016) 10 tablet 0  . cefdinir (OMNICEF) 300 MG capsule Take 1 capsule (300 mg total) by mouth 2 (two) times daily. (Patient not taking: Reported on 10/24/2016) 20 capsule 0  . ciprofloxacin (CIPRO) 250 MG tablet Take 1 tablet (250 mg total) by mouth 2 (two) times daily. (Patient not taking: Reported on 10/24/2016) 14 tablet 0  . doxycycline (VIBRA-TABS) 100 MG tablet Take 1 tablet (100 mg total) by mouth 2 (two) times daily. (Patient not taking: Reported on 05/01/2017) 24 tablet 0  . methylPREDNISolone (MEDROL DOSEPAK) 4 MG TBPK tablet Take 6 pills on day one then decrease by 1 pill each day (Patient not taking: Reported on 04/27/2016) 21 tablet 0  . predniSONE (DELTASONE) 10 MG tablet 6 tablets on Day 1 , then reduce by 1 tablet daily until gone (Patient not taking: Reported on 04/27/2016) 21 tablet 0  . sulfamethoxazole-trimethoprim (BACTRIM DS,SEPTRA DS) 800-160 MG tablet Take 1 tablet by mouth 2 (two) times daily. (Patient not taking: Reported on 10/24/2016) 14 tablet 0   No facility-administered medications prior to visit.     Review of Systems   Patient denies headache, fevers, malaise, unintentional weight loss, , eye pain, sinus congestion and sinus pain, sore throat, dysphagia,  hemoptysis , cough, dyspnea, wheezing, chest pain, palpitations, orthopnea, edema, abdominal pain, nausea, melena, diarrhea, constipation, flank pain, dysuria, hematuria, urinary  Frequency, nocturia, numbness, tingling, seizures,  Focal weakness, Loss of consciousness,  Tremor, insomnia,  depression, anxiety, and suicidal ideation.     Objective:  BP 116/62 (BP Location: Left Arm, Patient Position: Sitting, Cuff Size: Large)   Pulse 69   Temp 98.2 F (36.8 C) (Oral)   Resp 15   Ht 5\' 4"  (1.626 m)   Wt 289 lb 12.8 oz (131.5 kg)   SpO2 97%   BMI 49.74 kg/m   Physical Exam  General appearance: alert, cooperative and appears stated age Head: Normocephalic, without obvious abnormality, atraumatic Eyes: conjunctivae/corneas clear. PERRL, EOM's intact. Fundi benign. Ears: normal TM's and external ear canals both ears Nose: Nares normal. Septum midline. Mucosa normal. No drainage or sinus tenderness. Throat: lips, mucosa, and tongue normal; teeth and gums normal Neck: no adenopathy, no carotid bruit, no JVD, supple, symmetrical, trachea midline and thyroid not enlarged, symmetric, no tenderness/mass/nodules Lungs: clear to auscultation bilaterally Breasts: normal appearance, no masses or tenderness Heart: regular rate and rhythm, S1, S2 normal, no murmur, click, rub or gallop Abdomen: soft, non-tender; bowel sounds normal; no masses,  no organomegaly Extremities: extremities normal, atraumatic, no cyanosis or edema Pulses: 2+ and symmetric Skin:  Erythematous papular facial rash limited to chin.  Neurologic: Alert and oriented X 3, normal strength and tone. Normal symmetric reflexes. Normal coordination and gait.     Assessment & Plan:   Problem List Items Addressed This Visit    Dermatitis  Facial, limited to chin, persistent for months.  Doxycycline bid x 7 days       Encounter for preventive health examination    Annual comprehensive preventive exam was done as well as an evaluation and management of chronic conditions .  During the course of the visit the patient was educated and counseled about appropriate screening and preventive services including :  diabetes screening, lipid analysis with projected  10 year  risk for CAD , nutrition counseling, breast,  cervical and colorectal cancer screening, and recommended immunizations.  Printed recommendations for health maintenance screenings was given      Idiopathic subglottic tracheal stenosis    She [name] been reevaluated by ENT and no need for dilation was deemed.  She [name] modified her eating habits (avoiding eating at night) and her symptoms have resolved.       Obesity    I have addressed  BMI and recommended wt loss of 10% of body weight over the next 6 months using a low fat, low starch, high protein  fruit/vegetable based Mediterranean diet and 30 minutes of aerobic exercise a minimum of 5 days per week.        Type 2 diabetes mellitus without complications (HCC)    :  Her  a1c [name] risen to 6.5,   although her fasting  glucose [name] never been elevated enough to diagnose  Diabetes.  Asked to focus on weight loss of 12 lbs through low GI diet and exercise and return in 3 months.        Other Visit Diagnoses    Screening for HIV (human immunodeficiency virus)    -  Primary   Relevant Orders   HIV antibody (Completed)   Weight gain       Relevant Orders   TSH (Completed)   Prediabetes       Relevant Orders   Hemoglobin A1c (Completed)   Comprehensive metabolic panel (Completed)   Lipid panel (Completed)      I have discontinued Tempie DonningMichelle K. Cofield's predniSONE, methylPREDNISolone, cefdinir, ciprofloxacin, sulfamethoxazole-trimethoprim, and doxycycline. I am also having her start on doxycycline. Additionally, I am having her maintain her etonogestrel, cetirizine, albuterol, phenazopyridine, and omeprazole.  Meds ordered this encounter  Medications  . doxycycline (VIBRA-TABS) 100 MG tablet    Sig: Take 1 tablet (100 mg total) by mouth 2 (two) times daily.    Dispense:  14 tablet    Refill:  0    Medications Discontinued During This Encounter  Medication Reason  . cefdinir (OMNICEF) 300 MG capsule Completed Course  . ciprofloxacin (CIPRO) 250 MG tablet Completed Course  .  doxycycline (VIBRA-TABS) 100 MG tablet Completed Course  . methylPREDNISolone (MEDROL DOSEPAK) 4 MG TBPK tablet Completed Course  . predniSONE (DELTASONE) 10 MG tablet Completed Course  . sulfamethoxazole-trimethoprim (BACTRIM DS,SEPTRA DS) 800-160 MG tablet Completed Course    Follow-up: No Follow-up on file.   Sherlene Shamseresa L Keilan Nichol, MD

## 2017-05-02 ENCOUNTER — Encounter: Payer: Self-pay | Admitting: Internal Medicine

## 2017-05-02 DIAGNOSIS — E119 Type 2 diabetes mellitus without complications: Secondary | ICD-10-CM | POA: Insufficient documentation

## 2017-05-02 DIAGNOSIS — L309 Dermatitis, unspecified: Secondary | ICD-10-CM | POA: Insufficient documentation

## 2017-05-02 LAB — HIV ANTIBODY (ROUTINE TESTING W REFLEX): HIV: NONREACTIVE

## 2017-05-02 NOTE — Assessment & Plan Note (Signed)
:    Her  a1c has risen to 6.5,   although her fasting  glucose has never been elevated enough to diagnose  Diabetes.  Asked to focus on weight loss of 12 lbs through low GI diet and exercise and return in 3 months.

## 2017-05-02 NOTE — Assessment & Plan Note (Signed)
She has been reevaluated by ENT and no need for dilation was deemed.  She has modified her eating habits (avoiding eating at night) and her symptoms have resolved.

## 2017-05-02 NOTE — Assessment & Plan Note (Signed)
Facial, limited to chin, persistent for months.  Doxycycline bid x 7 days

## 2017-05-02 NOTE — Assessment & Plan Note (Signed)
I have addressed  BMI and recommended wt loss of 10% of body weight over the next 6 months using a low fat, low starch, high protein  fruit/vegetable based Mediterranean diet and 30 minutes of aerobic exercise a minimum of 5 days per week.   

## 2017-05-02 NOTE — Assessment & Plan Note (Signed)
Annual comprehensive preventive exam was done as well as an evaluation and management of chronic conditions .  During the course of the visit the patient was educated and counseled about appropriate screening and preventive services including :  diabetes screening, lipid analysis with projected  10 year  risk for CAD , nutrition counseling, breast, cervical and colorectal cancer screening, and recommended immunizations.  Printed recommendations for health maintenance screenings was given 

## 2017-05-17 ENCOUNTER — Encounter: Payer: Self-pay | Admitting: Internal Medicine

## 2017-06-11 ENCOUNTER — Encounter: Payer: Self-pay | Admitting: Internal Medicine

## 2017-06-13 ENCOUNTER — Other Ambulatory Visit: Payer: Self-pay

## 2017-06-13 MED ORDER — OMEPRAZOLE 40 MG PO CPDR: 40.0000 mg | DELAYED_RELEASE_CAPSULE | Freq: Every day | ORAL | 1 refills | Status: DC

## 2017-07-13 ENCOUNTER — Encounter: Payer: Self-pay | Admitting: Internal Medicine

## 2017-07-13 DIAGNOSIS — E119 Type 2 diabetes mellitus without complications: Secondary | ICD-10-CM

## 2017-07-13 DIAGNOSIS — E785 Hyperlipidemia, unspecified: Secondary | ICD-10-CM

## 2017-08-15 ENCOUNTER — Other Ambulatory Visit (INDEPENDENT_AMBULATORY_CARE_PROVIDER_SITE_OTHER): Payer: No Typology Code available for payment source

## 2017-08-15 DIAGNOSIS — E785 Hyperlipidemia, unspecified: Secondary | ICD-10-CM

## 2017-08-15 DIAGNOSIS — E119 Type 2 diabetes mellitus without complications: Secondary | ICD-10-CM | POA: Diagnosis not present

## 2017-08-15 LAB — LIPID PANEL
CHOLESTEROL: 158 mg/dL (ref 0–200)
HDL: 35.5 mg/dL — ABNORMAL LOW (ref >39.00)
LDL Cholesterol: 91 mg/dL (ref 0–99)
NONHDL: 122.56
Total CHOL/HDL Ratio: 4
Triglycerides: 160 mg/dL — ABNORMAL HIGH (ref 0.0–149.0)
VLDL: 32 mg/dL (ref 0.0–40.0)

## 2017-08-15 LAB — COMPREHENSIVE METABOLIC PANEL
ALBUMIN: 3.9 g/dL (ref 3.5–5.2)
ALK PHOS: 52 U/L (ref 39–117)
ALT: 26 U/L (ref 0–35)
AST: 17 U/L (ref 0–37)
BUN: 16 mg/dL (ref 6–23)
CO2: 25 mEq/L (ref 19–32)
Calcium: 9.3 mg/dL (ref 8.4–10.5)
Chloride: 105 mEq/L (ref 96–112)
Creatinine, Ser: 0.62 mg/dL (ref 0.40–1.20)
GFR: 112.96 mL/min (ref >60.00)
GLUCOSE: 84 mg/dL (ref 70–99)
POTASSIUM: 3.4 meq/L — AB (ref 3.5–5.1)
Sodium: 138 mEq/L (ref 135–145)
TOTAL PROTEIN: 7 g/dL (ref 6.0–8.3)
Total Bilirubin: 0.4 mg/dL (ref 0.2–1.2)

## 2017-08-15 LAB — LDL CHOLESTEROL, DIRECT: Direct LDL: 110 mg/dL

## 2017-08-15 LAB — HEMOGLOBIN A1C: HEMOGLOBIN A1C: 5.9 % (ref 4.6–6.5)

## 2017-08-16 ENCOUNTER — Other Ambulatory Visit: Payer: Self-pay

## 2017-08-21 ENCOUNTER — Ambulatory Visit: Payer: Self-pay | Admitting: Internal Medicine

## 2017-08-21 ENCOUNTER — Ambulatory Visit (INDEPENDENT_AMBULATORY_CARE_PROVIDER_SITE_OTHER): Payer: No Typology Code available for payment source | Admitting: Internal Medicine

## 2017-08-21 ENCOUNTER — Encounter: Payer: Self-pay | Admitting: Internal Medicine

## 2017-08-21 VITALS — BP 102/60 | HR 70 | Temp 98.2°F | Resp 14 | Ht 64.0 in | Wt 269.4 lb

## 2017-08-21 DIAGNOSIS — Z794 Long term (current) use of insulin: Secondary | ICD-10-CM | POA: Diagnosis not present

## 2017-08-21 DIAGNOSIS — E669 Obesity, unspecified: Secondary | ICD-10-CM | POA: Diagnosis not present

## 2017-08-21 DIAGNOSIS — E119 Type 2 diabetes mellitus without complications: Secondary | ICD-10-CM

## 2017-08-21 NOTE — Assessment & Plan Note (Signed)
I have congratulated her in reduction of   BMI and encouraged  Continued weight loss with goal of  24 lbs over the  next 6 months using a low glycemic index diet and regular exercise a minimum of 5 days per week.

## 2017-08-21 NOTE — Assessment & Plan Note (Addendum)
:    Her  a1c has dropped from 6.5 to 5.9 with low GI diet resulting in a weight loss of 20 lbs  Encourage to continue weight loss with next goal of 24 lbs  through low GI diet and exercise and return in 6 months.   Lab Results  Component Value Date   HGBA1C 5.9 08/15/2017   No results found for: Concepcion ElkMICROALBUR, MALB24HUR

## 2017-08-21 NOTE — Progress Notes (Signed)
Subjective:  Patient ID: Lisa [name], female    DOB: 21-Oct-1975  Age: 42 y.o. MRN: [id number]  CC: The primary encounter diagnosis was Type 2 diabetes mellitus without complication, with long-term current use of insulin (HCC). A diagnosis of Obesity, unspecified classification, unspecified obesity type, unspecified whether serious comorbidity present was also pertinent to this visit.  HPI Lisa [name] presents for follow up on obesity with type 2 DM   She has lost 20 lb since December by following a low carbohydrate diet.  She is not exercising but states that she has been working outside in the yard, on the weekends,  Weather permitting.  She is not inclined to start an exercise program.  Her personal goal is a total of 30 lbs.   Outpatient Medications Prior to Visit  Medication Sig Dispense Refill  . cetirizine (ZYRTEC) 10 MG tablet Take 10 mg by mouth daily.    Marland Kitchen etonogestrel (NEXPLANON) 68 MG IMPL implant 1 each by Subdermal route once.    Marland Kitchen omeprazole (PRILOSEC) 40 MG capsule Take 1 capsule (40 mg total) by mouth daily. 90 capsule 1  . albuterol (PROVENTIL HFA;VENTOLIN HFA) 108 (90 Base) MCG/ACT inhaler Inhale 2 puffs into the lungs every 6 (six) hours as needed for wheezing or shortness of breath. (Patient not taking: Reported on 05/01/2017) 1 Inhaler 0  . doxycycline (VIBRA-TABS) 100 MG tablet Take 1 tablet (100 mg total) by mouth 2 (two) times daily. (Patient not taking: Reported on 08/21/2017) 14 tablet 0  . phenazopyridine (PYRIDIUM) 200 MG tablet Take 1 tablet (200 mg total) by mouth 3 (three) times daily as needed for pain. (Patient not taking: Reported on 08/21/2017) 10 tablet 0   No facility-administered medications prior to visit.     Review of Systems;  Patient denies headache, fevers, malaise, unintentional weight loss, skin rash, eye pain, sinus congestion and sinus pain, sore throat, dysphagia,  hemoptysis , cough, dyspnea, wheezing, chest pain, palpitations,  orthopnea, edema, abdominal pain, nausea, melena, diarrhea, constipation, flank pain, dysuria, hematuria, urinary  Frequency, nocturia, numbness, tingling, seizures,  Focal weakness, Loss of consciousness,  Tremor, insomnia, depression, anxiety, and suicidal ideation.      Objective:  BP 102/60 (BP Location: Left Arm, Patient Position: Sitting, Cuff Size: Large)   Pulse 70   Temp 98.2 F (36.8 C) (Oral)   Resp 14   Ht 5\' 4"  (1.626 m)   Wt 269 lb 6.4 oz (122.2 kg)   SpO2 98%   BMI 46.24 kg/m   BP Readings from Last 3 Encounters:  08/21/17 102/60  05/01/17 116/62  10/24/16 110/80    Wt Readings from Last 3 Encounters:  08/21/17 269 lb 6.4 oz (122.2 kg)  05/01/17 289 lb 12.8 oz (131.5 kg)  04/27/16 270 lb 12 oz (122.8 kg)    General appearance: alert, cooperative and appears stated age Ears: normal TM's and external ear canals both ears Throat: lips, mucosa, and tongue normal; teeth and gums normal Neck: no adenopathy, no carotid bruit, supple, symmetrical, trachea midline and thyroid not enlarged, symmetric, no tenderness/mass/nodules Back: symmetric, no curvature. ROM normal. No CVA tenderness. Lungs: clear to auscultation bilaterally Heart: regular rate and rhythm, S1, S2 normal, no murmur, click, rub or gallop Abdomen: soft, non-tender; bowel sounds normal; no masses,  no organomegaly Pulses: 2+ and symmetric Skin: Skin color, texture, turgor normal. No rashes or lesions Lymph nodes: Cervical, supraclavicular, and axillary nodes normal.  Lab Results  Component Value Date   HGBA1C 5.9  08/15/2017   HGBA1C 6.5 05/01/2017   HGBA1C 5.8 (H) 10/29/2014    Lab Results  Component Value Date   CREATININE 0.62 08/15/2017   CREATININE 0.67 05/01/2017   CREATININE 0.71 04/20/2016    Lab Results  Component Value Date   WBC 8.1 04/20/2016   HGB 14.1 04/20/2016   HCT 41.8 04/20/2016   PLT 250.0 04/20/2016   GLUCOSE 84 08/15/2017   CHOL 158 08/15/2017   TRIG 160.0 (H)  08/15/2017   HDL 35.50 (L) 08/15/2017   LDLDIRECT 110.0 08/15/2017   LDLCALC 91 08/15/2017   ALT 26 08/15/2017   AST 17 08/15/2017   NA 138 08/15/2017   K 3.4 (L) 08/15/2017   CL 105 08/15/2017   CREATININE 0.62 08/15/2017   BUN 16 08/15/2017   CO2 25 08/15/2017   TSH 2.22 05/01/2017   HGBA1C 5.9 08/15/2017     Assessment & Plan:   Problem List Items Addressed This Visit    Type 2 diabetes mellitus without complications (HCC) - Primary    :  Her  a1c has dropped from 6.5 to 5.9 with low GI diet resulting in a weight loss of 20 lbs  Encourage to continue weight loss with next goal of 24 lbs  through low GI diet and exercise and return in 6 months.   Lab Results  Component Value Date   HGBA1C 5.9 08/15/2017   No results found for: MICROALBUR, MALB24HUR       Relevant Orders   Microalbumin / creatinine urine ratio   Hemoglobin A1c   Comprehensive metabolic panel   Lipid panel   Obesity    I have congratulated her in reduction of   BMI and encouraged  Continued weight loss with goal of  24 lbs over the  next 6 months using a low glycemic index diet and regular exercise a minimum of 5 days per week.          A total of 25 minutes of face to face time was spent with patient more than half of which was spent in counselling about the above mentioned conditions  and coordination of care .  I have discontinued Tempie DonningMichelle K. Welford's albuterol, phenazopyridine, and doxycycline. I am also having her maintain her etonogestrel, cetirizine, and omeprazole.  No orders of the defined types were placed in this encounter.   Medications Discontinued During This Encounter  Medication Reason  . albuterol (PROVENTIL HFA;VENTOLIN HFA) 108 (90 Base) MCG/ACT inhaler Patient has not taken in last 30 days  . doxycycline (VIBRA-TABS) 100 MG tablet Completed Course  . phenazopyridine (PYRIDIUM) 200 MG tablet Patient has not taken in last 30 days    Follow-up: Return in about 6 months  (around 02/20/2018) for obesity type 2 DM.   Sherlene Shamseresa L Michaelyn Wall, MD

## 2017-08-28 ENCOUNTER — Encounter: Payer: Self-pay | Admitting: Internal Medicine

## 2017-08-29 MED ORDER — NYSTATIN 100000 UNIT/GM EX CREA: 1.0000 "application " | TOPICAL_CREAM | Freq: Two times a day (BID) | CUTANEOUS | 0 refills | Status: DC

## 2017-08-31 ENCOUNTER — Other Ambulatory Visit: Payer: Self-pay

## 2017-11-29 ENCOUNTER — Other Ambulatory Visit: Payer: Self-pay | Admitting: Internal Medicine

## 2017-11-29 ENCOUNTER — Encounter: Payer: Self-pay | Admitting: Internal Medicine

## 2017-11-29 MED ORDER — PANTOPRAZOLE SODIUM 40 MG PO TBEC: 40.0000 mg | DELAYED_RELEASE_TABLET | Freq: Every day | ORAL | 3 refills | Status: DC

## 2017-12-01 ENCOUNTER — Telehealth: Payer: No Typology Code available for payment source | Admitting: Family

## 2017-12-01 DIAGNOSIS — M5442 Lumbago with sciatica, left side: Secondary | ICD-10-CM

## 2017-12-01 DIAGNOSIS — M5441 Lumbago with sciatica, right side: Secondary | ICD-10-CM | POA: Diagnosis not present

## 2017-12-01 MED ORDER — ETODOLAC 300 MG PO CAPS: 300.0000 mg | ORAL_CAPSULE | Freq: Two times a day (BID) | ORAL | 0 refills | Status: DC

## 2017-12-01 MED ORDER — BACLOFEN 10 MG PO TABS: 10.0000 mg | ORAL_TABLET | Freq: Three times a day (TID) | ORAL | 0 refills | Status: DC | PRN

## 2017-12-01 NOTE — Progress Notes (Signed)
Thank you for the details you included in the comment boxes. Those details are very helpful in determining the best course of treatment for you and help us to provide the best care.  We are sorry that you are not feeling well.  Here is how we plan to help!  Based on what you have shared with me it looks like you mostly have acute back pain.  Acute back pain is defined as musculoskeletal pain that can resolve in 1-3 weeks with conservative treatment.  I have prescribed Etodolac 300 mg twice a day non-steroid anti-inflammatory (NSAID) as well as Baclofen 10 mg every eight hours as needed which is a muscle relaxer  Some patients experience stomach irritation or in increased heartburn with anti-inflammatory drugs.  Please keep in mind that muscle relaxer's can cause fatigue and should not be taken while at work or driving.  Back pain is very common.  The pain often gets better over time.  The cause of back pain is usually not dangerous.  Most people can learn to manage their back pain on their own.  Home Care  Stay active.  Start with short walks on flat ground if you can.  Try to walk farther each day.  Do not sit, drive or stand in one place for more than 30 minutes.  Do not stay in bed.  Do not avoid exercise or work.  Activity can help your back heal faster.  Be careful when you bend or lift an object.  Bend at your knees, keep the object close to you, and do not twist.  Sleep on a firm mattress.  Lie on your side, and bend your knees.  If you lie on your back, put a pillow under your knees.  Only take medicines as told by your doctor.  Put ice on the injured area.  Put ice in a plastic bag  Place a towel between your skin and the bag  Leave the ice on for 15-20 minutes, 3-4 times a day for the first 2-3 days. 210 After that, you can switch between ice and heat packs.  Ask your doctor about back exercises or massage.  Avoid feeling anxious or stressed.  Find good ways to deal with  stress, such as exercise.  Get Help Right Way If:  Your pain does not go away with rest or medicine.  Your pain does not go away in 1 week.  You have new problems.  You do not feel well.  The pain spreads into your legs.  You cannot control when you poop (bowel movement) or pee (urinate)  You feel sick to your stomach (nauseous) or throw up (vomit)  You have belly (abdominal) pain.  You feel like you may pass out (faint).  If you develop a fever.  Make Sure you:  Understand these instructions.  Will watch your condition  Will get help right away if you are not doing well or get worse.  Your e-visit answers were reviewed by a board certified advanced clinical practitioner to complete your personal care plan.  Depending on the condition, your plan could have included both over the counter or prescription medications.  If there is a problem please reply  once you have received a response from your provider.  Your safety is important to us.  If you have drug allergies check your prescription carefully.    You can use MyChart to ask questions about today's visit, request a non-urgent call back, or ask for a work   or school excuse for 24 hours related to this e-Visit. If it has been greater than 24 hours you will need to follow up with your provider, or enter a new e-Visit to address those concerns.  You will get an e-mail in the next two days asking about your experience.  I hope that your e-visit has been valuable and will speed your recovery. Thank you for using e-visits.   

## 2017-12-05 ENCOUNTER — Telehealth: Payer: No Typology Code available for payment source | Admitting: Family

## 2017-12-05 DIAGNOSIS — S39012D Strain of muscle, fascia and tendon of lower back, subsequent encounter: Secondary | ICD-10-CM

## 2017-12-05 MED ORDER — PREDNISONE 10 MG PO TABS: ORAL_TABLET | ORAL | 0 refills | Status: DC

## 2017-12-05 NOTE — Progress Notes (Signed)
Based on what you shared with me it looks like you have a serious condition that should be evaluated in a face to face office visit.  NOTE: If you entered your credit card information for this eVisit, you will not be charged. You may see a "hold" on your card for the $30 but that hold will drop off and you will not have a charge processed.  If you are having a true medical emergency please call 911.  If you need an urgent face to face visit, [place] has four urgent care centers for your convenience.  If you need care fast and have a high deductible or no insurance consider:   [email or web address] to reserve your spot online an avoid wait times  [facility] [address], [address] [place], [place] [address] 8 am to 8 pm Monday-Friday 10 am to 4 pm Saturday-Sunday *Across the street from [employer]  [facility]  [address] [place], [address] 8 am to 5 pm Monday-Friday * In the [facility]   The following sites will take your  insurance:  . [facility]  [phone] Get Driving Directions Find a Provider at this Location  [address] [place], [place] [address] . 10 am to 8 pm Monday-Friday . 12 pm to 8 pm Saturday-Sunday   . [facility]  [phone] Get Driving Directions Find a Provider at this Location  [address], [address] [place], [place] [address] . 8 am to 8 pm Monday-Friday . 9 am to 6 pm Saturday . 11 am to 6 pm Sunday   . [facility] [place]  [phone] Get Driving Directions  [address] [place],  [address] . 8 am to 8 pm Monday-Friday . 8 am to 4 pm Saturday-Sunday   Your e-visit answers were reviewed by a board certified advanced clinical practitioner to complete your personal care plan.  Thank you for using e-Visits.  

## 2017-12-06 ENCOUNTER — Ambulatory Visit: Payer: Self-pay | Admitting: Family

## 2017-12-11 ENCOUNTER — Encounter: Payer: Self-pay | Admitting: Internal Medicine

## 2017-12-11 DIAGNOSIS — M5431 Sciatica, right side: Secondary | ICD-10-CM

## 2017-12-11 DIAGNOSIS — M5432 Sciatica, left side: Principal | ICD-10-CM

## 2017-12-12 ENCOUNTER — Telehealth: Payer: Self-pay

## 2017-12-12 ENCOUNTER — Telehealth: Payer: Self-pay | Admitting: Internal Medicine

## 2017-12-12 MED ORDER — TRAMADOL HCL 50 MG PO TABS: 50.0000 mg | ORAL_TABLET | Freq: Three times a day (TID) | ORAL | 0 refills | Status: DC | PRN

## 2017-12-12 NOTE — Telephone Encounter (Signed)
Copied from CRM [phone]. Topic: General - Other >> Dec 12, 2017 11:57 AM Debroah LoopLander, Lumin L wrote: Reason for CRM: Dr. Darrick Huntsmanullo would like patient worked in to see her within a week per mychart message from today 12/12/2017.

## 2017-12-12 NOTE — Telephone Encounter (Signed)
LMTCB. Please transfer pt to our office.  

## 2017-12-13 ENCOUNTER — Encounter: Payer: Self-pay | Admitting: Internal Medicine

## 2017-12-13 ENCOUNTER — Ambulatory Visit (INDEPENDENT_AMBULATORY_CARE_PROVIDER_SITE_OTHER): Payer: No Typology Code available for payment source | Admitting: Internal Medicine

## 2017-12-13 ENCOUNTER — Ambulatory Visit
Admission: RE | Admit: 2017-12-13 | Discharge: 2017-12-13 | Disposition: A | Discharge status: Home / Self Care | Payer: No Typology Code available for payment source | Source: Ambulatory Visit | Attending: Internal Medicine | Admitting: Internal Medicine

## 2017-12-13 VITALS — BP 100/60 | HR 80 | Temp 98.1°F | Resp 14 | Ht 64.0 in | Wt 254.0 lb

## 2017-12-13 DIAGNOSIS — M5442 Lumbago with sciatica, left side: Secondary | ICD-10-CM

## 2017-12-13 DIAGNOSIS — M545 Low back pain, unspecified: Secondary | ICD-10-CM

## 2017-12-13 DIAGNOSIS — Z6841 Body Mass Index (BMI) 40.0 and over, adult: Secondary | ICD-10-CM

## 2017-12-13 DIAGNOSIS — M5431 Sciatica, right side: Secondary | ICD-10-CM | POA: Insufficient documentation

## 2017-12-13 DIAGNOSIS — M5432 Sciatica, left side: Principal | ICD-10-CM

## 2017-12-13 DIAGNOSIS — M5441 Lumbago with sciatica, right side: Secondary | ICD-10-CM

## 2017-12-13 DIAGNOSIS — E119 Type 2 diabetes mellitus without complications: Secondary | ICD-10-CM | POA: Diagnosis not present

## 2017-12-13 LAB — CBC WITH DIFFERENTIAL/PLATELET
BASOS PCT: 0.7 % (ref 0.0–3.0)
Basophils Absolute: 0.1 10*3/uL (ref 0.0–0.1)
EOS ABS: 0.1 10*3/uL (ref 0.0–0.7)
Eosinophils Relative: 1.4 % (ref 0.0–5.0)
HCT: 44.1 % (ref 36.0–46.0)
HEMOGLOBIN: 14.5 g/dL (ref 12.0–15.0)
LYMPHS ABS: 2.2 10*3/uL (ref 0.7–4.0)
Lymphocytes Relative: 21.9 % (ref 12.0–46.0)
MCHC: 32.9 g/dL (ref 30.0–36.0)
MCV: 85 fl (ref 78.0–100.0)
MONO ABS: 0.5 10*3/uL (ref 0.1–1.0)
Monocytes Relative: 5.3 % (ref 3.0–12.0)
NEUTROS PCT: 70.7 % (ref 43.0–77.0)
Neutro Abs: 7 10*3/uL (ref 1.4–7.7)
PLATELETS: 277 10*3/uL (ref 150.0–400.0)
RBC: 5.19 Mil/uL — ABNORMAL HIGH (ref 3.87–5.11)
RDW: 14.3 % (ref 11.5–15.5)
WBC: 9.9 10*3/uL (ref 4.0–10.5)

## 2017-12-13 LAB — C-REACTIVE PROTEIN: CRP: 1.1 mg/dL (ref 0.5–20.0)

## 2017-12-13 LAB — SEDIMENTATION RATE: Sed Rate: 33 mm/hr — ABNORMAL HIGH (ref 0–20)

## 2017-12-13 MED ORDER — ETODOLAC 300 MG PO CAPS: 300.0000 mg | ORAL_CAPSULE | Freq: Three times a day (TID) | ORAL | 1 refills | Status: DC

## 2017-12-13 MED ORDER — BACLOFEN 10 MG PO TABS: 10.0000 mg | ORAL_TABLET | Freq: Three times a day (TID) | ORAL | 0 refills | Status: DC | PRN

## 2017-12-13 NOTE — Patient Instructions (Signed)
lodine 300 mg e three times daily  tylenol up to 2000 mg daily in divided doses  Add baclofen and/or tramadol as needed   PT referral  Stool softener daily colace up to 200 mg (docusate)

## 2017-12-13 NOTE — Telephone Encounter (Signed)
Pt was seen in the office this afternoon.

## 2017-12-13 NOTE — Progress Notes (Signed)
Subjective:  Patient ID: Lisa [name], female    DOB: 1975-10-06  Age: 41 y.o. MRN: [id number]  CC: The primary encounter diagnosis was Acute midline low back pain without sciatica. Diagnoses of Acute midline low back pain with bilateral sciatica, Class 3 severe obesity with body mass index (BMI) of 40.0 to 44.9 in adult, unspecified obesity type, unspecified whether serious comorbidity present (HCC), and Type 2 diabetes mellitus without complication, without long-term current use of insulin (HCC) were also pertinent to this visit.  HPI Lisa [name] presents for evaluation of back pain that has been present for he past 4 weeks in a constant pattern,  The pain in sacral, just above the tailbone, and started during her beach vacation  . The pain transiently  improved with use of a prednisone taper that was prescribed on July 23 after treatment with lodine prescribed during an E visit on July 19  Did not help. The pain is aggravated by bending  over and sitting down .  Pain is in the center and radiates aross the si joints but does not radiate to either leg .  She has no history of fall,  No prior surgeries..  Previous episode of back pain was evaluated several years ago with lumbar spine films which note mild spurrig but no alignment issues or disk space narrowing. n  Using a TENS unit .  Has been receiving therapeutic  massage once a month which has been helpful.     Outpatient Medications Prior to Visit  Medication Sig Dispense Refill  . cetirizine (ZYRTEC) 10 MG tablet Take 10 mg by mouth daily.    Marland Kitchen etonogestrel (NEXPLANON) 68 MG IMPL implant 1 each by Subdermal route once.    Marland Kitchen omeprazole (PRILOSEC) 40 MG capsule Take 1 capsule (40 mg total) by mouth daily. 90 capsule 1  . pantoprazole (PROTONIX) 40 MG tablet Take 1 tablet (40 mg total) by mouth daily. 30 tablet 3  . traMADol (ULTRAM) 50 MG tablet Take 1 tablet (50 mg total) by mouth every 8 (eight) hours as needed. 30 tablet 0    . baclofen (LIORESAL) 10 MG tablet Take 1 tablet (10 mg total) by mouth every 8 (eight) hours as needed for muscle spasms. 30 each 0  . etodolac (LODINE) 300 MG capsule Take 1 capsule (300 mg total) by mouth 2 (two) times daily. 20 capsule 0  . nystatin cream (MYCOSTATIN) Apply 1 application topically 2 (two) times daily. (Patient not taking: Reported on 12/13/2017) 30 g 0  . predniSONE (DELTASONE) 10 MG tablet 6 tablets on Day 1 , then reduce by 1 tablet daily until gone (Patient not taking: Reported on 12/13/2017) 21 tablet 0   No facility-administered medications prior to visit.     Review of Systems;  Patient denies headache, fevers, malaise, unintentional weight loss, skin rash, eye pain, sinus congestion and sinus pain, sore throat, dysphagia,  hemoptysis , cough, dyspnea, wheezing, chest pain, palpitations, orthopnea, edema, abdominal pain, nausea, melena, diarrhea, constipation, flank pain, dysuria, hematuria, urinary  Frequency, nocturia, numbness, tingling, seizures,  Focal weakness, Loss of consciousness,  Tremor, insomnia, depression, anxiety, and suicidal ideation.      Objective:  BP 100/60 (BP Location: Left Arm, Patient Position: Sitting, Cuff Size: Large)   Pulse 80   Temp 98.1 F (36.7 C) (Oral)   Resp 14   Ht 5\' 4"  (1.626 m)   Wt 254 lb (115.2 kg)   SpO2 98%   BMI 43.60 kg/m  BP Readings from Last 3 Encounters:  12/13/17 100/60  08/21/17 102/60  05/01/17 116/62    Wt Readings from Last 3 Encounters:  12/13/17 254 lb (115.2 kg)  08/21/17 269 lb 6.4 oz (122.2 kg)  05/01/17 289 lb 12.8 oz (131.5 kg)    General appearance: alert, cooperative and appears stated age  Neck: no adenopathy, no carotid bruit, supple, symmetrical, trachea midline and thyroid not enlarged, symmetric, no tenderness/mass/nodules Back: symmetric, no curvature. ROM normal. No  Spinal or CVA tenderness. Mild tenderness over the SI joints without redness swelling or obvious defomrity,   Straight leg lift negative bilaterally  Lungs: clear to auscultation bilaterally Heart: regular rate and rhythm, S1, S2 normal, no murmur, click, rub or gallop Abdomen: soft, non-tender; bowel sounds normal; no masses,  no organomegaly Pulses: 2+ and symmetric Skin: Skin color, texture, turgor normal. No rashes or lesions Lymph nodes: Cervical, supraclavicular, and axillary nodes normal.  Lab Results  Component Value Date   HGBA1C 5.9 08/15/2017   HGBA1C 6.5 05/01/2017   HGBA1C 5.8 (H) 10/29/2014    Lab Results  Component Value Date   CREATININE 0.62 08/15/2017   CREATININE 0.67 05/01/2017   CREATININE 0.71 04/20/2016    Lab Results  Component Value Date   WBC 9.9 12/13/2017   HGB 14.5 12/13/2017   HCT 44.1 12/13/2017   PLT 277.0 12/13/2017   GLUCOSE 84 08/15/2017   CHOL 158 08/15/2017   TRIG 160.0 (H) 08/15/2017   HDL 35.50 (L) 08/15/2017   LDLDIRECT 110.0 08/15/2017   LDLCALC 91 08/15/2017   ALT 26 08/15/2017   AST 17 08/15/2017   NA 138 08/15/2017   K 3.4 (L) 08/15/2017   CL 105 08/15/2017   CREATININE 0.62 08/15/2017   BUN 16 08/15/2017   CO2 25 08/15/2017   TSH 2.22 05/01/2017   HGBA1C 5.9 08/15/2017    Dg Lumbar Spine Complete  Result Date: 12/13/2017 CLINICAL DATA:  Persistent low back pain with bilateral sciatic symptoms. Duration of symptoms 4 weeks. EXAM: LUMBAR SPINE - COMPLETE 4+ VIEW COMPARISON:  Lumbar spine series of April 27, 2015 FINDINGS: The lumbar vertebral bodies are preserved in height. The pedicles and transverse processes are intact. The disc space heights are well maintained. There is no spondylolisthesis. There are tiny anterior superior endplate spurs from L2 through L5. There is no significant facet joint hypertrophy. The observed portions of the sacrum are normal. IMPRESSION: There is no acute or significant chronic bony abnormality of the lumbar spine. Seen Electronically Signed   By: David  SwazilandJordan M.D.   On: 12/13/2017 08:37     Assessment & Plan:   Problem List Items Addressed This Visit    Low back pain without sciatica - Primary    Plain films today are unchanged from 2016.  Continue lodine at maximal dose,  Add tylenol and prn tramadol for severe pain . Already has an  MR.  PT referral for TENS Unit .  Weight loss of 35 lbs noted since April,  encouarged to continue working at gettting BMI < 40 as this will help       Relevant Medications   etodolac (LODINE) 300 MG capsule   baclofen (LIORESAL) 10 MG tablet   Other Relevant Orders   Sedimentation rate (Completed)   C-reactive protein (Completed)   CBC with Differential/Platelet (Completed)   Ambulatory referral to Physical Therapy   PT massage   Obesity    I have congratulated her in reduction of   BMI and  encouraged  Continued weight loss with goal of 10% of body weigh over the next 6 months using a low glycemic index diet and regular exercise a minimum of 5 days per week.        Type 2 diabetes mellitus without complications (HCC)   Relevant Orders   Hemoglobin A1c   Comprehensive metabolic panel   Lipid panel    Other Visit Diagnoses    Acute midline low back pain with bilateral sciatica       Relevant Medications   etodolac (LODINE) 300 MG capsule   baclofen (LIORESAL) 10 MG tablet      I have discontinued Marcelino Duster K. Andrepont's nystatin cream and predniSONE. I have also changed her etodolac. Additionally, I am having her maintain her etonogestrel, cetirizine, omeprazole, pantoprazole, traMADol, and baclofen.  Meds ordered this encounter  Medications  . etodolac (LODINE) 300 MG capsule    Sig: Take 1 capsule (300 mg total) by mouth 3 (three) times daily.    Dispense:  90 capsule    Refill:  1  . baclofen (LIORESAL) 10 MG tablet    Sig: Take 1 tablet (10 mg total) by mouth every 8 (eight) hours as needed for muscle spasms.    Dispense:  30 each    Refill:  0    Keep on file for future refills    Medications Discontinued During  This Encounter  Medication Reason  . nystatin cream (MYCOSTATIN) Completed Course  . predniSONE (DELTASONE) 10 MG tablet Completed Course  . etodolac (LODINE) 300 MG capsule   . baclofen (LIORESAL) 10 MG tablet Reorder    Follow-up: No follow-ups on file.   Sherlene Shams, MD

## 2017-12-15 NOTE — Assessment & Plan Note (Addendum)
Plain films today are unchanged from 2016.  Continue lodine at maximal dose,  Add tylenol and prn tramadol for severe pain . Already has an  MR.  PT referral for TENS Unit .  Weight loss of 35 lbs noted since April,  encouarged to continue working at gettting BMI < 40 as this will help

## 2017-12-15 NOTE — Assessment & Plan Note (Signed)
I have congratulated her in reduction of   BMI and encouraged  Continued weight loss with goal of 10% of body weigh over the next 6 months using a low glycemic index diet and regular exercise a minimum of 5 days per week.    

## 2017-12-19 ENCOUNTER — Telehealth: Payer: No Typology Code available for payment source | Admitting: Physician Assistant

## 2017-12-19 DIAGNOSIS — N390 Urinary tract infection, site not specified: Secondary | ICD-10-CM

## 2017-12-19 MED ORDER — NITROFURANTOIN MONOHYD MACRO 100 MG PO CAPS: 100.0000 mg | ORAL_CAPSULE | Freq: Two times a day (BID) | ORAL | 0 refills | Status: AC

## 2017-12-19 NOTE — Progress Notes (Signed)

## 2017-12-25 DIAGNOSIS — M545 Low back pain, unspecified: Secondary | ICD-10-CM

## 2018-01-11 ENCOUNTER — Other Ambulatory Visit: Payer: Self-pay | Admitting: Internal Medicine

## 2018-01-11 DIAGNOSIS — Z1231 Encounter for screening mammogram for malignant neoplasm of breast: Secondary | ICD-10-CM

## 2018-01-12 ENCOUNTER — Ambulatory Visit: Payer: No Typology Code available for payment source

## 2018-01-27 MED ORDER — PENCICLOVIR 1 % EX CREA: 1.0000 "application " | TOPICAL_CREAM | CUTANEOUS | 0 refills | Status: DC

## 2018-01-30 ENCOUNTER — Telehealth: Payer: Self-pay

## 2018-01-30 NOTE — Telephone Encounter (Signed)
PA for Denavir cream has been submitted on covermymeds.

## 2018-02-02 ENCOUNTER — Telehealth: Payer: Self-pay | Admitting: Internal Medicine

## 2018-02-02 NOTE — Telephone Encounter (Signed)
Copied from CRM [id number]. Topic: Quick Communication - See Telephone Encounter >> Feb 02, 2018  4:30 PM Jens SomMedley, Jennifer A wrote: CRM for notification. See Telephone encounter for: 02/02/18. Jess calling from [employer] had some clinical questions regarding PA for medication Denavir. Reference Number [id number]  Please advise [phone]

## 2018-02-05 NOTE — Telephone Encounter (Signed)
Questions about prior authorizations

## 2018-02-06 NOTE — Telephone Encounter (Signed)
LMTCB. Please transfer pt to our office.  

## 2018-02-07 NOTE — Telephone Encounter (Signed)
Spoke with pt and she stated that she takes both the denavir and the valtrex. PA questions have been answered and faxed back.

## 2018-02-12 DIAGNOSIS — B001 Herpesviral vesicular dermatitis: Secondary | ICD-10-CM

## 2018-02-12 NOTE — Telephone Encounter (Signed)
Medication has been approved through 02/09/2018-02/09/2019. Pt and pharmacy are aware.

## 2018-02-15 ENCOUNTER — Other Ambulatory Visit: Payer: Self-pay | Admitting: Internal Medicine

## 2018-02-16 ENCOUNTER — Ambulatory Visit (INDEPENDENT_AMBULATORY_CARE_PROVIDER_SITE_OTHER): Payer: No Typology Code available for payment source | Admitting: Internal Medicine

## 2018-02-16 ENCOUNTER — Encounter: Payer: Self-pay | Admitting: Internal Medicine

## 2018-02-16 ENCOUNTER — Other Ambulatory Visit: Payer: No Typology Code available for payment source

## 2018-02-16 DIAGNOSIS — E119 Type 2 diabetes mellitus without complications: Secondary | ICD-10-CM

## 2018-02-16 DIAGNOSIS — M545 Low back pain, unspecified: Secondary | ICD-10-CM

## 2018-02-16 DIAGNOSIS — Z794 Long term (current) use of insulin: Secondary | ICD-10-CM

## 2018-02-16 DIAGNOSIS — Z6841 Body Mass Index (BMI) 40.0 and over, adult: Secondary | ICD-10-CM

## 2018-02-16 LAB — COMPREHENSIVE METABOLIC PANEL
ALK PHOS: 45 U/L (ref 39–117)
ALT: 25 U/L (ref 0–35)
AST: 18 U/L (ref 0–37)
Albumin: 4.2 g/dL (ref 3.5–5.2)
BILIRUBIN TOTAL: 0.5 mg/dL (ref 0.2–1.2)
BUN: 21 mg/dL (ref 6–23)
CO2: 27 mEq/L (ref 19–32)
Calcium: 9.5 mg/dL (ref 8.4–10.5)
Chloride: 106 mEq/L (ref 96–112)
Creatinine, Ser: 0.69 mg/dL (ref 0.40–1.20)
GFR: 99.59 mL/min (ref >60.00)
GLUCOSE: 107 mg/dL — AB (ref 70–99)
POTASSIUM: 4.1 meq/L (ref 3.5–5.1)
SODIUM: 138 meq/L (ref 135–145)
TOTAL PROTEIN: 7.1 g/dL (ref 6.0–8.3)

## 2018-02-16 LAB — HEMOGLOBIN A1C: HEMOGLOBIN A1C: 5.8 % (ref 4.6–6.5)

## 2018-02-16 LAB — MICROALBUMIN / CREATININE URINE RATIO
CREATININE, U: 101.4 mg/dL
Microalb Creat Ratio: 0.7 mg/g (ref 0.0–30.0)
Microalb, Ur: <0.7 mg/dL (ref 0.0–1.9)

## 2018-02-16 LAB — LIPID PANEL
CHOLESTEROL: 182 mg/dL (ref 0–200)
HDL: 38.8 mg/dL — ABNORMAL LOW (ref >39.00)
LDL CALC: 112 mg/dL — AB (ref 0–99)
NONHDL: 142.96
Total CHOL/HDL Ratio: 5
Triglycerides: 157 mg/dL — ABNORMAL HIGH (ref 0.0–149.0)
VLDL: 31.4 mg/dL (ref 0.0–40.0)

## 2018-02-16 MED ORDER — OMEPRAZOLE 40 MG PO CPDR: 40.0000 mg | DELAYED_RELEASE_CAPSULE | Freq: Every day | ORAL | 1 refills | Status: DC

## 2018-02-16 NOTE — Progress Notes (Signed)
Subjective:  Patient ID: Lisa [name], female    DOB: [birth year]  Age: 42 y.o. MRN: [id number]  CC: Diagnoses of Type 2 diabetes mellitus without complication, without long-term current use of insulin (HCC), Type 2 diabetes mellitus without complication, with long-term current use of insulin (HCC), Class 3 severe obesity without serious comorbidity with body mass index (BMI) of 40.0 to 44.9 in adult, unspecified obesity type (HCC), and Acute bilateral low back pain without sciatica were pertinent to this visit.  HPI WAHNETA DEROCHER presents for follow up on weight management    No weight loss since Juy visit  Body mass index is 43.67 kg/m.  Admits that she had several weeks of carb binging during her back pain episode when she was unable to do anything .   Her back is no longer bothering her but she continues to avoid regular exercise.  Diet reviewed in detail.  counselling given.  The benefits of exercise were discussed.    Outpatient Medications Prior to Visit  Medication Sig Dispense Refill  . cetirizine (ZYRTEC) 10 MG tablet Take 10 mg by mouth daily.    Marland Kitchen etonogestrel (NEXPLANON) 68 MG IMPL implant 1 each by Subdermal route once.    Marland Kitchen omeprazole (PRILOSEC) 40 MG capsule Take 1 capsule (40 mg total) by mouth daily. 90 capsule 1  . baclofen (LIORESAL) 10 MG tablet Take 1 tablet (10 mg total) by mouth every 8 (eight) hours as needed for muscle spasms. (Patient not taking: Reported on 02/16/2018) 30 each 0  . DENAVIR 1 % cream   0  . etodolac (LODINE) 300 MG capsule Take 1 capsule (300 mg total) by mouth 3 (three) times daily. (Patient not taking: Reported on 02/16/2018) 90 capsule 1  . pantoprazole (PROTONIX) 40 MG tablet Take 1 tablet (40 mg total) by mouth daily. (Patient not taking: Reported on 02/16/2018) 30 tablet 3  . traMADol (ULTRAM) 50 MG tablet Take 1 tablet (50 mg total) by mouth every 8 (eight) hours as needed. (Patient not taking: Reported on 02/16/2018) 30 tablet 0    No facility-administered medications prior to visit.     Review of Systems;  Patient denies headache, fevers, malaise, unintentional weight loss, skin rash, eye pain, sinus congestion and sinus pain, sore throat, dysphagia,  hemoptysis , cough, dyspnea, wheezing, chest pain, palpitations, orthopnea, edema, abdominal pain, nausea, melena, diarrhea, constipation, flank pain, dysuria, hematuria, urinary  Frequency, nocturia, numbness, tingling, seizures,  Focal weakness, Loss of consciousness,  Tremor, insomnia, depression, anxiety, and suicidal ideation.      Objective:  BP 110/74 (BP Location: Left Arm, Patient Position: Sitting, Cuff Size: Large)   Pulse 74   Temp 98.4 F (36.9 C) (Oral)   Resp 14   Ht 5\' 4"  (1.626 m)   Wt 254 lb 6.4 oz (115.4 kg)   SpO2 97%   BMI 43.67 kg/m   BP Readings from Last 3 Encounters:  02/16/18 110/74  12/13/17 100/60  08/21/17 102/60    Wt Readings from Last 3 Encounters:  02/16/18 254 lb 6.4 oz (115.4 kg)  12/13/17 254 lb (115.2 kg)  08/21/17 269 lb 6.4 oz (122.2 kg)    General appearance: alert, cooperative and appears stated age Ears: normal TM's and external ear canals both ears Throat: lips, mucosa, and tongue normal; teeth and gums normal Neck: no adenopathy, no carotid bruit, supple, symmetrical, trachea midline and thyroid not enlarged, symmetric, no tenderness/mass/nodules Back: symmetric, no curvature. ROM normal. No CVA tenderness. Lungs: clear  to auscultation bilaterally Heart: regular rate and rhythm, S1, S2 normal, no murmur, click, rub or gallop Abdomen: soft, non-tender; bowel sounds normal; no masses,  no organomegaly Pulses: 2+ and symmetric Skin: Skin color, texture, turgor normal. No rashes or lesions Lymph nodes: Cervical, supraclavicular, and axillary nodes normal.  Lab Results  Component Value Date   HGBA1C 5.8 02/16/2018   HGBA1C 5.9 08/15/2017   HGBA1C 6.5 05/01/2017    Lab Results  Component Value Date    CREATININE 0.69 02/16/2018   CREATININE 0.62 08/15/2017   CREATININE 0.67 05/01/2017    Lab Results  Component Value Date   WBC 9.9 12/13/2017   HGB 14.5 12/13/2017   HCT 44.1 12/13/2017   PLT 277.0 12/13/2017   GLUCOSE 107 (H) 02/16/2018   CHOL 182 02/16/2018   TRIG 157.0 (H) 02/16/2018   HDL 38.80 (L) 02/16/2018   LDLDIRECT 110.0 08/15/2017   LDLCALC 112 (H) 02/16/2018   ALT 25 02/16/2018   AST 18 02/16/2018   NA 138 02/16/2018   K 4.1 02/16/2018   CL 106 02/16/2018   CREATININE 0.69 02/16/2018   BUN 21 02/16/2018   CO2 27 02/16/2018   TSH 2.22 05/01/2017   HGBA1C 5.8 02/16/2018   MICROALBUR <0.7 02/16/2018    Dg Lumbar Spine Complete  Result Date: 12/13/2017 CLINICAL DATA:  Persistent low back pain with bilateral sciatic symptoms. Duration of symptoms 4 weeks. EXAM: LUMBAR SPINE - COMPLETE 4+ VIEW COMPARISON:  Lumbar spine series of April 27, 2015 FINDINGS: The lumbar vertebral bodies are preserved in height. The pedicles and transverse processes are intact. The disc space heights are well maintained. There is no spondylolisthesis. There are tiny anterior superior endplate spurs from L2 through L5. There is no significant facet joint hypertrophy. The observed portions of the sacrum are normal. IMPRESSION: There is no acute or significant chronic bony abnormality of the lumbar spine. Seen Electronically Signed   By: David  [place] M.D.   On: 12/13/2017 08:37    Assessment & Plan:   Problem List Items Addressed This Visit    Low back pain without sciatica    Plain films are unchanged from 2016.  Continue lodine at maximal dose,  Add tylenol and prn tramadol for severe pain . Already has an  MR.  PT referral for TENS Unit .  Weight loss of 35 lbs noted since April,  encouarged to continue working at gettting BMI < 40 as this will help       Obesity    I have addressed  BMI and recommended a low glycemic index diet utilizing smaller more frequent meals to increase  metabolism.  I have also recommended that patient start exercising with a goal of 30 minutes of aerobic exercise a minimum of 5 days per week. Screening for lipid disorders, thyroid and diabetes to be done today.  Lab Results  Component Value Date   HGBA1C 5.8 02/16/2018   Lab Results  Component Value Date   TSH 2.22 05/01/2017   Lab Results  Component Value Date   CHOL 182 02/16/2018   HDL 38.80 (L) 02/16/2018   LDLCALC 112 (H) 02/16/2018   LDLDIRECT 110.0 08/15/2017   TRIG 157.0 (H) 02/16/2018   CHOLHDL 5 02/16/2018        Type 2 diabetes mellitus without complications (HCC)    :  Her  a1c has dropped from 6.5 to 5.8 with low GI diet.  Encouraged to continue weight loss with next goal of 24 lbs  through low GI diet and exercise and return in 6 months.   Lab Results  Component Value Date   HGBA1C 5.8 02/16/2018   Lab Results  Component Value Date   MICROALBUR <0.7 02/16/2018            I am having Marcelino Duster K. Roldan maintain her etonogestrel, cetirizine, pantoprazole, traMADol, etodolac, baclofen, DENAVIR, and omeprazole.  Meds ordered this encounter  Medications  . omeprazole (PRILOSEC) 40 MG capsule    Sig: Take 1 capsule (40 mg total) by mouth daily.    Dispense:  90 capsule    Refill:  1    Medications Discontinued During This Encounter  Medication Reason  . omeprazole (PRILOSEC) 40 MG capsule Reorder    Follow-up: No follow-ups on file.   Sherlene Shams, MD

## 2018-02-16 NOTE — Patient Instructions (Signed)
Your weight has plateaued  Since last visit  I want you to add 30 mintues of exercise daily to your routien  See you in December

## 2018-02-17 NOTE — Assessment & Plan Note (Signed)
Plain films are unchanged from 2016.  Continue lodine at maximal dose,  Add tylenol and prn tramadol for severe pain . Already has an  MR.  PT referral for TENS Unit .  Weight loss of 35 lbs noted since April,  encouarged to continue working at gettting BMI < 40 as this will help

## 2018-02-17 NOTE — Assessment & Plan Note (Signed)
:    Her  a1c has dropped from 6.5 to 5.8 with low GI diet.  Encouraged to continue weight loss with next goal of 24 lbs  through low GI diet and exercise and return in 6 months.   Lab Results  Component Value Date   HGBA1C 5.8 02/16/2018   Lab Results  Component Value Date   MICROALBUR <0.7 02/16/2018

## 2018-02-17 NOTE — Assessment & Plan Note (Signed)
I have addressed  BMI and recommended a low glycemic index diet utilizing smaller more frequent meals to increase metabolism.  I have also recommended that patient start exercising with a goal of 30 minutes of aerobic exercise a minimum of 5 days per week. Screening for lipid disorders, thyroid and diabetes to be done today.  Lab Results  Component Value Date   HGBA1C 5.8 02/16/2018   Lab Results  Component Value Date   TSH 2.22 05/01/2017   Lab Results  Component Value Date   CHOL 182 02/16/2018   HDL 38.80 (L) 02/16/2018   LDLCALC 112 (H) 02/16/2018   LDLDIRECT 110.0 08/15/2017   TRIG 157.0 (H) 02/16/2018   CHOLHDL 5 02/16/2018

## 2018-02-23 ENCOUNTER — Ambulatory Visit: Payer: Self-pay | Admitting: Internal Medicine

## 2018-02-26 ENCOUNTER — Other Ambulatory Visit: Payer: Self-pay | Admitting: Internal Medicine

## 2018-02-26 ENCOUNTER — Ambulatory Visit
Admission: RE | Admit: 2018-02-26 | Discharge: 2018-02-26 | Disposition: A | Discharge status: Home / Self Care | Payer: No Typology Code available for payment source | Source: Ambulatory Visit | Attending: Internal Medicine | Admitting: Internal Medicine

## 2018-02-26 DIAGNOSIS — Z1231 Encounter for screening mammogram for malignant neoplasm of breast: Secondary | ICD-10-CM | POA: Insufficient documentation

## 2018-02-26 DIAGNOSIS — N6489 Other specified disorders of breast: Secondary | ICD-10-CM

## 2018-02-26 DIAGNOSIS — R928 Other abnormal and inconclusive findings on diagnostic imaging of breast: Secondary | ICD-10-CM

## 2018-02-27 ENCOUNTER — Encounter: Payer: Self-pay | Admitting: Internal Medicine

## 2018-02-27 DIAGNOSIS — R928 Other abnormal and inconclusive findings on diagnostic imaging of breast: Secondary | ICD-10-CM | POA: Insufficient documentation

## 2018-03-12 ENCOUNTER — Ambulatory Visit: Payer: No Typology Code available for payment source

## 2018-03-20 ENCOUNTER — Ambulatory Visit
Admission: RE | Admit: 2018-03-20 | Discharge: 2018-03-20 | Disposition: A | Discharge status: Home / Self Care | Payer: No Typology Code available for payment source | Source: Ambulatory Visit | Attending: Internal Medicine | Admitting: Internal Medicine

## 2018-03-20 DIAGNOSIS — R928 Other abnormal and inconclusive findings on diagnostic imaging of breast: Secondary | ICD-10-CM | POA: Diagnosis present

## 2018-03-20 DIAGNOSIS — N6489 Other specified disorders of breast: Secondary | ICD-10-CM

## 2018-03-27 ENCOUNTER — Other Ambulatory Visit: Payer: Self-pay | Admitting: Internal Medicine

## 2018-03-28 DIAGNOSIS — M722 Plantar fascial fibromatosis: Secondary | ICD-10-CM

## 2018-05-02 ENCOUNTER — Encounter: Payer: Self-pay | Admitting: Internal Medicine

## 2018-05-18 MED ORDER — PREDNISONE 10 MG PO TABS: ORAL_TABLET | ORAL | 0 refills | Status: DC

## 2018-06-23 ENCOUNTER — Telehealth: Payer: No Typology Code available for payment source | Admitting: Nurse Practitioner

## 2018-06-23 DIAGNOSIS — J0100 Acute maxillary sinusitis, unspecified: Secondary | ICD-10-CM

## 2018-06-23 MED ORDER — PREDNISONE 20 MG PO TABS: ORAL_TABLET | ORAL | 0 refills | Status: DC

## 2018-06-23 NOTE — Progress Notes (Signed)
We are sorry that you are not feeling well.  Here is how we plan to help!  Based on what you have shared with me it looks like you have sinusitis.  Sinusitis is inflammation and infection in the sinus cavities of the head.  Based on your presentation I believe you most likely have Acute Viral Sinusitis.This is an infection most likely caused by a virus. There is not specific treatment for viral sinusitis other than to help you with the symptoms until the infection runs its course.  You may use an oral decongestant such as Mucinex D or if you have glaucoma or high blood pressure use plain Mucinex. Saline nasal spray help and can safely be used as often as needed for congestion, I have prescribed:I have sent in prednisone 20mg  2 tablets daily for 5 days   Some authorities believe that zinc sprays or the use of Echinacea may shorten the course of your symptoms.  Sinus infections are not as easily transmitted as other respiratory infection, however we still recommend that you avoid close contact with loved ones, especially the very young and elderly.  Remember to wash your hands thoroughly throughout the day as this is the number one way to prevent the spread of infection!  Home Care:  Only take medications as instructed by your medical team.  Do not take these medications with alcohol.  A steam or ultrasonic humidifier can help congestion.  You can place a towel over your head and breathe in the steam from hot water coming from a faucet.  Avoid close contacts especially the very young and the elderly.  Cover your mouth when you cough or sneeze.  Always remember to wash your hands.  Get Help Right Away If:  You develop worsening fever or sinus pain.  You develop a severe head ache or visual changes.  Your symptoms persist after you have completed your treatment plan.  Make sure you  Understand these instructions.  Will watch your condition.  Will get help right away if you are not  doing well or get worse.  Your e-visit answers were reviewed by a board certified advanced clinical practitioner to complete your personal care plan.  Depending on the condition, your plan could have included both over the counter or prescription medications.  If there is a problem please reply  once you have received a response from your provider.  Your safety is important to [place].  If you have drug allergies check your prescription carefully.    You can use MyChart to ask questions about today's visit, request a non-urgent call back, or ask for a work or school excuse for 24 hours related to this e-Visit. If it has been greater than 24 hours you will need to follow up with your provider, or enter a new e-Visit to address those concerns.  You will get an e-mail in the next two days asking about your experience.  I hope that your e-visit has been valuable and will speed your recovery. Thank you for using e-visits.  5 minutes spent reviewing and documenting in chart.

## 2018-08-08 ENCOUNTER — Encounter: Payer: Self-pay | Admitting: Obstetrics and Gynecology

## 2018-08-13 NOTE — Telephone Encounter (Signed)
LMTCB. Please transfer pt to our office.  

## 2018-08-16 ENCOUNTER — Telehealth: Payer: Self-pay

## 2018-08-16 ENCOUNTER — Ambulatory Visit (INDEPENDENT_AMBULATORY_CARE_PROVIDER_SITE_OTHER): Payer: No Typology Code available for payment source | Admitting: Internal Medicine

## 2018-08-16 DIAGNOSIS — J386 Stenosis of larynx: Secondary | ICD-10-CM

## 2018-08-16 DIAGNOSIS — J301 Allergic rhinitis due to pollen: Secondary | ICD-10-CM | POA: Diagnosis not present

## 2018-08-16 DIAGNOSIS — E119 Type 2 diabetes mellitus without complications: Secondary | ICD-10-CM | POA: Diagnosis not present

## 2018-08-16 MED ORDER — BENZONATATE 200 MG PO CAPS: 200.0000 mg | ORAL_CAPSULE | Freq: Three times a day (TID) | ORAL | 1 refills | Status: DC | PRN

## 2018-08-16 MED ORDER — PREDNISONE 10 MG PO TABS: ORAL_TABLET | ORAL | 0 refills | Status: DC

## 2018-08-16 MED ORDER — OMEPRAZOLE 40 MG PO CPDR: 40.0000 mg | DELAYED_RELEASE_CAPSULE | Freq: Every day | ORAL | 1 refills | Status: DC

## 2018-08-16 MED ORDER — FLUTICASONE PROPIONATE 50 MCG/ACT NA SUSP: 2.0000 | Freq: Every day | NASAL | 2 refills | Status: DC

## 2018-08-16 NOTE — Patient Instructions (Addendum)
Increase your allegra dose to twice daily   Flonase  Once daily as well (rx sent but may be cheaper OTC; nasonex is not covered either!)    We can add a topical eye drop for allergies if needed (patanol)   Tessalon perles as needed for cough (rx sent)     During the pollen and virus season,  You should flush your sinuses once daily with OTC  [employer]'s Sinus rinse ;  It is a strong sinus "flush" using water and medicated salts.  Do it over the sink because it can be a bit messy  You are due for 6 month follow up labs, let me know where you want to get them done

## 2018-08-16 NOTE — Telephone Encounter (Signed)
Pt is scheduled for a virtual visit with Dr. Darrick Huntsman at 11am today.

## 2018-08-16 NOTE — Progress Notes (Signed)
Virtual Visit via Video Note  I connected with@ on 08/18/18 at 11:00 AM EDT by a video enabled telemedicine application and verified that I am speaking with the correct person using two identifiers.  Location patient: home Location provider:work or home office Persons participating in the virtual visit: patient, provider  I discussed the limitations of evaluation and management by telemedicine and the availability of in person appointments. The patient expressed understanding and agreed to proceed.   HPI:  Allergy symptoms :cough, sneezing,  Eyes itching,  usiing allegra once daily.  Treated acute sinusitis treated in Feb during E Visit Historof severe allergies causing swelling In the setting of subglottic stenosis treated in 2014 with dilation.  Last seen in June 2018 by [facility] .  She is asymptomatic currently but concerned about another episode of acute stenosis and requesting an rx for prednisone  To use prn  Wheezing or hoarseness.    Denies fever, body aches and dyspnea,  No known COVID exposure.  6 month follow up on diabetes.  Patient has no complaints today.  Patient is following a low glycemic index diet .   Fasting sugars have been under l120 most of the time and post prandials have been under 160 except on rare occasions. Patient is exercising about 3 times per week and intentionally trying to lose weight .  Patient has had an eye exam in the last 12 months and checks feet regularly for signs of infection.  Patient does not walk barefoot outside,  And denies an numbness tingling or burning in feet. Patient is up to date on all recommended vaccinations,    ROS: See pertinent positives and negatives per HPI.  Past Medical History:  Diagnosis Date  . Bronchitis    hx of  . Complication of anesthesia   . Dysrhythmia    "does not see cardiologist"  . Family history of anesthesia complication    "morphine night terrors"  . GERD (gastroesophageal reflux disease)   .  Headache(784.0)    "migraines"  . Hypertension   . PONV (postoperative nausea and vomiting)   . Shortness of breath    "wheezing"    Past Surgical History:  Procedure Laterality Date  . CESAREAN SECTION     x 2  . MICROLARYNGOSCOPY WITH LASER AND BALLOON DILATION N/A 07/20/2012   Procedure: MICROLARYNGOSCOPY WITH LASER AND BALLOON DILATION;  Surgeon: Christia Reading, MD;  Location: [facility] OR;  Service: ENT;  Laterality: N/A;  WITH JET VENTURI VENTILATION  . TONGUE FLAP RELEASE    . widom extractions      Family History  Problem Relation Age of Onset  . Hypertension Mother   . Hyperlipidemia Mother   . Arthritis Mother   . Hypertension Father   . Hyperlipidemia Father     SOCIAL HX: unchanged    Current Outpatient Medications:  .  cetirizine (ZYRTEC) 10 MG tablet, Take 10 mg by mouth daily., Disp: , Rfl:  .  DENAVIR 1 % cream, APPLY TOPICALLY EVERY TWO HOURS FOR 4 DAYS AS NEEDED FOR COLD SORES, Disp: 5 g, Rfl: 0 .  etonogestrel (NEXPLANON) 68 MG IMPL implant, 1 each by Subdermal route once., Disp: , Rfl:  .  omeprazole (PRILOSEC) 40 MG capsule, Take 1 capsule (40 mg total) by mouth daily., Disp: 90 capsule, Rfl: 1 .  benzonatate (TESSALON) 200 MG capsule, Take 1 capsule (200 mg total) by mouth 3 (three) times daily as needed for cough., Disp: 60 capsule, Rfl: 1 .  fluticasone (  FLONASE ALLERGY RELIEF) 50 MCG/ACT nasal spray, Place 2 sprays into both nostrils daily., Disp: 16 g, Rfl: 2 .  predniSONE (DELTASONE) 10 MG tablet, 6 tablets on Day 1 , then reduce by 1 tablet daily until gone, Disp: 21 tablet, Rfl: 0  EXAM:  VITALS per patient if applicable:  GENERAL: alert, oriented, appears well and in no acute distress  HEENT: atraumatic, conjunctiva clear, no obvious abnormalities on inspection of external nose and ears. Voice normal., no hoarseness   NECK: normal movements of the head and neck  LUNGS: on inspection no signs of respiratory distress, breathing rate appears normal, no  obvious gross SOB, gasping or wheezing  CV: no obvious cyanosis  MS: moves all visible extremities without noticeable abnormality  PSYCH/NEURO: pleasant and cooperative, no obvious depression or anxiety, speech and thought processing grossly intact  ASSESSMENT AND PLAN:  Discussed the following assessment and plan:  Idiopathic subglottic tracheal stenosis  Seasonal allergic rhinitis due to pollen  Type 2 diabetes mellitus without complication, without long-term current use of insulin (HCC) - Plan: Hemoglobin A1c, Comprehensive metabolic panel     I discussed the assessment and treatment plan with the patient. The patient was provided an opportunity to ask questions and all were answered. The patient agreed with the plan and demonstrated an understanding of the instructions.   The patient was advised to call back or seek an in-person evaluation if the symptoms worsen or if the condition fails to improve as anticipated.  I provided  25 minutes of non-face-to-face time during this encounter.   Sherlene Shams, MD

## 2018-08-16 NOTE — Telephone Encounter (Signed)
Copied from CRM [id number]. Topic: General - Other >> Aug 16, 2018  9:41 AM Tamela Oddi wrote: Reason for CRM: Patient is returning a call to [facility] regarding scheduling a virtual visit.  Please call patient back at [phone]

## 2018-08-18 DIAGNOSIS — J301 Allergic rhinitis due to pollen: Secondary | ICD-10-CM | POA: Insufficient documentation

## 2018-08-18 NOTE — Assessment & Plan Note (Signed)
:    Her  a1c has dropped from 6.5 to 5.8 with low GI diet.  Encouraged to continue weight loss with next goal of 24 lbs  through low GI diet and exercise and repeat surveillance labs every  6 months.   Lab Results  Component Value Date   HGBA1C 5.8 02/16/2018   Lab Results  Component Value Date   MICROALBUR <0.7 02/16/2018

## 2018-08-18 NOTE — Assessment & Plan Note (Addendum)
Increase allegra to bid,  add flonase and start daily saline irrigations .  Continue PPI.  Tessalon for cough

## 2018-08-18 NOTE — Assessment & Plan Note (Signed)
With emergency procedure required in 2014 for airway maintenance.  Given current allergy symptoms agree with patient's need to have prednisone dose pack available for use if symptoms suggestive of recurrence of subglottic stenosis recur

## 2018-10-22 ENCOUNTER — Other Ambulatory Visit: Payer: Self-pay | Admitting: Internal Medicine

## 2018-10-22 DIAGNOSIS — N6489 Other specified disorders of breast: Secondary | ICD-10-CM

## 2018-11-06 ENCOUNTER — Other Ambulatory Visit: Payer: Self-pay

## 2018-11-06 ENCOUNTER — Ambulatory Visit
Admission: RE | Admit: 2018-11-06 | Discharge: 2018-11-06 | Disposition: A | Discharge status: Home / Self Care | Payer: No Typology Code available for payment source | Source: Ambulatory Visit | Attending: Internal Medicine | Admitting: Internal Medicine

## 2018-11-06 ENCOUNTER — Other Ambulatory Visit: Payer: Self-pay | Admitting: Internal Medicine

## 2018-11-06 DIAGNOSIS — N6489 Other specified disorders of breast: Secondary | ICD-10-CM | POA: Diagnosis not present

## 2018-11-06 MED ORDER — TIZANIDINE HCL 4 MG PO CAPS: 4.0000 mg | ORAL_CAPSULE | Freq: Three times a day (TID) | ORAL | 0 refills | Status: DC

## 2018-12-17 ENCOUNTER — Encounter: Payer: No Typology Code available for payment source | Admitting: Internal Medicine

## 2019-01-18 ENCOUNTER — Other Ambulatory Visit (INDEPENDENT_AMBULATORY_CARE_PROVIDER_SITE_OTHER): Payer: No Typology Code available for payment source

## 2019-01-18 ENCOUNTER — Other Ambulatory Visit: Payer: Self-pay

## 2019-01-18 DIAGNOSIS — E119 Type 2 diabetes mellitus without complications: Secondary | ICD-10-CM | POA: Diagnosis not present

## 2019-01-18 LAB — COMPREHENSIVE METABOLIC PANEL
ALT: 40 U/L — ABNORMAL HIGH (ref 0–35)
AST: 31 U/L (ref 0–37)
Albumin: 4.2 g/dL (ref 3.5–5.2)
Alkaline Phosphatase: 43 U/L (ref 39–117)
BUN: 17 mg/dL (ref 6–23)
CO2: 27 mEq/L (ref 19–32)
Calcium: 9.7 mg/dL (ref 8.4–10.5)
Chloride: 102 mEq/L (ref 96–112)
Creatinine, Ser: 0.76 mg/dL (ref 0.40–1.20)
GFR: 83.44 mL/min (ref >60.00)
Glucose, Bld: 115 mg/dL — ABNORMAL HIGH (ref 70–99)
Potassium: 4.4 mEq/L (ref 3.5–5.1)
Sodium: 137 mEq/L (ref 135–145)
Total Bilirubin: 0.8 mg/dL (ref 0.2–1.2)
Total Protein: 6.7 g/dL (ref 6.0–8.3)

## 2019-01-18 LAB — HEMOGLOBIN A1C: Hgb A1c MFr Bld: 6.2 % (ref 4.6–6.5)

## 2019-01-23 ENCOUNTER — Ambulatory Visit (INDEPENDENT_AMBULATORY_CARE_PROVIDER_SITE_OTHER): Payer: No Typology Code available for payment source | Admitting: Internal Medicine

## 2019-01-23 ENCOUNTER — Other Ambulatory Visit: Payer: Self-pay

## 2019-01-23 ENCOUNTER — Other Ambulatory Visit (HOSPITAL_COMMUNITY)
Admission: RE | Admit: 2019-01-23 | Discharge: 2019-01-23 | Disposition: A | Discharge status: Home / Self Care | Payer: No Typology Code available for payment source | Source: Ambulatory Visit | Attending: Internal Medicine | Admitting: Internal Medicine

## 2019-01-23 ENCOUNTER — Encounter: Payer: Self-pay | Admitting: Internal Medicine

## 2019-01-23 VITALS — BP 112/70 | HR 91 | Temp 97.8°F | Resp 15 | Ht 64.0 in | Wt 281.4 lb

## 2019-01-23 DIAGNOSIS — E785 Hyperlipidemia, unspecified: Secondary | ICD-10-CM

## 2019-01-23 DIAGNOSIS — Z124 Encounter for screening for malignant neoplasm of cervix: Secondary | ICD-10-CM

## 2019-01-23 DIAGNOSIS — R7401 Elevation of levels of liver transaminase levels: Secondary | ICD-10-CM

## 2019-01-23 DIAGNOSIS — Z0000 Encounter for general adult medical examination without abnormal findings: Secondary | ICD-10-CM | POA: Diagnosis not present

## 2019-01-23 DIAGNOSIS — N6459 Other signs and symptoms in breast: Secondary | ICD-10-CM

## 2019-01-23 DIAGNOSIS — E1169 Type 2 diabetes mellitus with other specified complication: Secondary | ICD-10-CM

## 2019-01-23 DIAGNOSIS — E66813 Obesity, class 3: Secondary | ICD-10-CM

## 2019-01-23 DIAGNOSIS — Z6841 Body Mass Index (BMI) 40.0 and over, adult: Secondary | ICD-10-CM

## 2019-01-23 DIAGNOSIS — R740 Nonspecific elevation of levels of transaminase and lactic acid dehydrogenase [LDH]: Secondary | ICD-10-CM

## 2019-01-23 DIAGNOSIS — Z0001 Encounter for general adult medical examination with abnormal findings: Secondary | ICD-10-CM

## 2019-01-23 DIAGNOSIS — E119 Type 2 diabetes mellitus without complications: Secondary | ICD-10-CM

## 2019-01-23 MED ORDER — OMEPRAZOLE 40 MG PO CPDR: 40.0000 mg | DELAYED_RELEASE_CAPSULE | Freq: Every day | ORAL | 1 refills | Status: DC

## 2019-01-23 NOTE — Patient Instructions (Signed)
GREAT WORK ON THE NOOM !!  KEEP DOING IT!  GET YOUR EYES EXAMINED  Fasting lipids and repeat liver enzymes in 1 month   Repeat your a1c in 6 months

## 2019-01-23 NOTE — Progress Notes (Signed)
Patient ID: Lisa [name], female    DOB: 10-Apr-1976  Age: 43 y.o. MRN: [id number]  The patient is here for annual preventive  examination and management of other chronic and acute problems.   The risk factors are reflected in the social history.  The roster of all physicians providing medical care to patient - is listed in the Snapshot section of the chart.  Activities of daily living:  The patient is 100% independent in all ADLs: dressing, toileting, feeding as well as independent mobility  Home safety : The patient has smoke detectors in the home. They wear seatbelts.  There are no firearms at home. There is no violence in the home.   There is no risks for hepatitis, STDs or HIV. There is no   history of blood transfusion. They have no travel history to infectious disease endemic areas of the world.  The patient has seen their dentist in the last six month. They have NOT seen their eye doctor in the last year. They admit to slight hearing difficulty with regard to whispered voices and some television programs.  They have deferred audiologic testing in the last year.  They do not  have excessive sun exposure. Discussed the need for sun protection: hats, long sleeves and use of sunscreen if there is significant sun exposure.   Diet: the importance of a healthy diet is discussed. They do have a healthy diet.  The benefits of regular aerobic exercise were discussed. She walks 3 times per week ,  20 minutes.   Depression screen: there are no signs or vegative symptoms of depression- irritability, change in appetite, anhedonia, sadness/tearfullness.  The following portions of the patient's history were reviewed and updated as appropriate: allergies, current medications, past family history, past medical history,  past surgical history, past social history  and problem list.  Visual acuity was not assessed per patient preference since she has regular follow up with her ophthalmologist. Hearing  and body mass index were assessed and reviewed.   During the course of the visit the patient was educated and counseled about appropriate screening and preventive services including : fall prevention , diabetes screening, nutrition counseling, colorectal cancer screening, and recommended immunizations.    CC: The primary encounter diagnosis was Abnormal breast exam. Diagnoses of Cervical cancer screening, Elevated ALT measurement, Hyperlipidemia associated with type 2 diabetes mellitus (HCC), Encounter for general adult medical examination with abnormal findings, Class 3 severe obesity without serious comorbidity with body mass index (BMI) of 40.0 to 44.9 in adult, unspecified obesity type (HCC), and Type 2 diabetes mellitus without complication, without long-term current use of insulin (HCC) were also pertinent to this visit.  1)  3 month follow up on diabetes.  Patient has no complaints today.  Patient is following a low glycemic index diet .  She doe snot check her blood sugars . Patient is walking about 3 times per week and intentionally trying to lose weight .  Patient has had an eye exam in the last 12 months and checks feet regularly for signs of infection.  Patient does not walk barefoot outside,  And denies an numbness tingling or burning in feet. Patient is up to date on all recommended vaccinations and her most recent a1c was 6.2.   2) Obesity:  She has had a net  weight gain of 27 lbs since October  2019.  She reports that she has actually lost  7 lbs in the last 9 days  Using the [employer]  weight loss program . She believes the program is successful because it is comprehensive,  Structured and forces the client to identify behaviors and thought patterns that lead to overeating.  3) Elevated ALT : patient denies a history of nausea, excessve tylenol use, and rigourous exercise.  No history of IVDU or needle sticks.  No history of hepatitis C     4) abnormal right breast mammogram: diagnostic  mammogram and ultrasound in June reviewed,  No evidence of malignancy   History Lisa [name] has a past medical history of Bronchitis, Complication of anesthesia, Dysrhythmia, Family history of anesthesia complication, GERD (gastroesophageal reflux disease), Headache(784.0), Hypertension, PONV (postoperative nausea and vomiting), and Shortness of breath.   She has a past surgical history that includes Cesarean section; widom extractions; Tongue flap release; and Microlaryngoscopy with laser and balloon dilation (N/A, 07/20/2012).   Her family history includes Arthritis in her mother; Hyperlipidemia in her father and mother; Hypertension in her father and mother.She reports that she has never smoked. She has never used smokeless tobacco. She reports current alcohol use. She reports that she does not use drugs.  Outpatient Medications Prior to Visit  Medication Sig Dispense Refill  . benzonatate (TESSALON) 200 MG capsule Take 1 capsule (200 mg total) by mouth 3 (three) times daily as needed for cough. 60 capsule 1  . cetirizine (ZYRTEC) 10 MG tablet Take 10 mg by mouth daily.    Marland Kitchen. DENAVIR 1 % cream APPLY TOPICALLY EVERY TWO HOURS FOR 4 DAYS AS NEEDED FOR COLD SORES 5 g 0  . etonogestrel (NEXPLANON) 68 MG IMPL implant 1 each by Subdermal route once.    . fluticasone (FLONASE ALLERGY RELIEF) 50 MCG/ACT nasal spray Place 2 sprays into both nostrils daily. 16 g 2  . tiZANidine (ZANAFLEX) 4 MG capsule Take 1 capsule (4 mg total) by mouth 3 (three) times daily. 90 capsule 0  . omeprazole (PRILOSEC) 40 MG capsule Take 1 capsule (40 mg total) by mouth daily. 90 capsule 1  . predniSONE (DELTASONE) 10 MG tablet 6 tablets on Day 1 , then reduce by 1 tablet daily until gone 21 tablet 0   No facility-administered medications prior to visit.     Review of Systems   Patient denies headache, fevers, malaise, unintentional weight loss, skin rash, eye pain, sinus congestion and sinus pain, sore throat, dysphagia,   hemoptysis , cough, dyspnea, wheezing, chest pain, palpitations, orthopnea, edema, abdominal pain, nausea, melena, diarrhea, constipation, flank pain, dysuria, hematuria, urinary  Frequency, nocturia, numbness, tingling, seizures,  Focal weakness, Loss of consciousness,  Tremor, insomnia, depression, anxiety, and suicidal ideation.     Objective:  BP 112/70 (BP Location: Left Arm, Patient Position: Sitting, Cuff Size: Large)   Pulse 91   Temp 97.8 F (36.6 C) (Temporal)   Resp 15   Ht 5\' 4"  (1.626 m)   Wt 281 lb 6.4 oz (127.6 kg)   SpO2 97%   BMI 48.30 kg/m   Physical Exam   General Appearance:    Alert, cooperative, no distress, appears stated age  Head:    Normocephalic, without obvious abnormality, atraumatic  Eyes:    PERRL, conjunctiva/corneas clear, EOM's intact, fundi    benign, both eyes  Ears:    Normal TM's and external ear canals, both ears  Nose:   Nares normal, septum midline, mucosa normal, no drainage    or sinus tenderness  Throat:   Lips, mucosa, and tongue normal; teeth and gums normal  Neck:   Supple, symmetrical,  trachea midline, no adenopathy;    thyroid:  no enlargement/tenderness/nodules; no carotid   bruit or JVD  Back:     Symmetric, no curvature, ROM normal, no CVA tenderness  Lungs:     Clear to auscultation bilaterally, respirations unlabored  Chest Wall:    No tenderness or deformity   Heart:    Regular rate and rhythm, S1 and S2 normal, no murmur, rub   or gallop  Breast Exam:    No tenderness,  or nipple abnormality. Left breast exam abnormal with retro aureolar nodularity noted at the 9:00 position   Abdomen:     Soft, non-tender, bowel sounds active all four quadrants,    no masses, no organomegaly  Genitalia:    Pelvic: cervix normal in appearance, external genitalia normal, no adnexal masses or tenderness, no cervical motion tenderness, rectovaginal septum normal, uterus normal size, shape, and consistency and vagina normal . Evidence of recent  menses (old blood) noted. without discharge  Extremities:   Extremities normal, atraumatic, no cyanosis or edema  Pulses:   2+ and symmetric all extremities  Skin:   Skin color, texture, turgor normal, no rashes or lesions  Lymph nodes:   Cervical, supraclavicular, and axillary nodes normal  Neurologic:   CNII-XII intact, normal strength, sensation and reflexes    throughout      Assessment & Plan:   Problem List Items Addressed This Visit      Unprioritized   Class 3 severe obesity without serious comorbidity with body mass index (BMI) of 40.0 to 44.9 in adult [facility])   Encounter for general adult medical examination with abnormal findings    Left breast exam was abnormal'  Diagnostic mammogram ordered. PAP smear was done,  Elevated ALT discussed with repeat hepatic panel ordered for 4 weeks from now.        Type 2 diabetes mellitus without complications ([place])    :  Her  a1c had dropped from 6.5 to 5.8 with low GI diet but has risen to 6.2 during the [place] pandemic .  Encouraged to continue weight loss with next goal of 24 lbs  through low GI diet and exercise and repeat surveillance labs every  6 months.  She needs to have the Pneumovax vaccine .  Lab Results  Component Value Date   HGBA1C 6.2 01/18/2019   Lab Results  Component Value Date   MICROALBUR <0.7 02/16/2018         Abnormal breast exam - Primary    She has a nontender retro aureolar mass on the left at the 9:00 position that is not symmetric.  Diagnostic mammogram ordered       Relevant Orders   MM DIAG BREAST TOMO BILATERAL   Elevated ALT measurement    Advised to Repeat in 4 weeks.  ddx includes transient elevation due to weight loss, fatty liver,  Occult hep c or autoimmune causes of hepatitis .  If ALT remains elevated at 4 weeks will initiate comprehensive evaluation.       Relevant Orders   Hepatic function panel    Other Visit Diagnoses    Cervical cancer screening       Relevant Orders   Cytology  - PAP( [place])   Hyperlipidemia associated with type 2 diabetes mellitus ([place])       Relevant Orders   Lipid panel      I have discontinued Adele Dan. Pendry's predniSONE. I am also having her maintain her etonogestrel, cetirizine, Denavir, fluticasone, benzonatate,  tiZANidine, and omeprazole.  Meds ordered this encounter  Medications  . omeprazole (PRILOSEC) 40 MG capsule    Sig: Take 1 capsule (40 mg total) by mouth daily.    Dispense:  90 capsule    Refill:  1    Medications Discontinued During This Encounter  Medication Reason  . predniSONE (DELTASONE) 10 MG tablet Patient has not taken in last 30 days  . omeprazole (PRILOSEC) 40 MG capsule Reorder    Follow-up: Return in about 6 months (around 07/23/2019) for follow up diabetes.   Sherlene Shams, MD

## 2019-01-24 ENCOUNTER — Other Ambulatory Visit: Payer: Self-pay | Admitting: Internal Medicine

## 2019-01-24 DIAGNOSIS — R7401 Elevation of levels of liver transaminase levels: Secondary | ICD-10-CM | POA: Insufficient documentation

## 2019-01-24 DIAGNOSIS — N6459 Other signs and symptoms in breast: Secondary | ICD-10-CM

## 2019-01-24 DIAGNOSIS — N644 Mastodynia: Secondary | ICD-10-CM | POA: Insufficient documentation

## 2019-01-24 NOTE — Assessment & Plan Note (Signed)
She has a nontender retro aureolar mass on the left at the 9:00 position that is not symmetric.  Diagnostic mammogram ordered

## 2019-01-24 NOTE — Assessment & Plan Note (Signed)
Left breast exam was abnormal'  Diagnostic mammogram ordered. PAP smear was done,  Elevated ALT discussed with repeat hepatic panel ordered for 4 weeks from now.

## 2019-01-24 NOTE — Assessment & Plan Note (Signed)
Advised to Repeat in 4 weeks.  ddx includes transient elevation due to weight loss, fatty liver,  Occult hep c or autoimmune causes of hepatitis .  If ALT remains elevated at 4 weeks will initiate comprehensive evaluation.

## 2019-01-24 NOTE — Assessment & Plan Note (Addendum)
:    Her  a1c had dropped from 6.5 to 5.8 with low GI diet but has risen to 6.2 during the [place] pandemic .  Encouraged to continue weight loss with next goal of 24 lbs  through low GI diet and exercise and repeat surveillance labs every  6 months.  She needs to have the Pneumovax vaccine .  Lab Results  Component Value Date   HGBA1C 6.2 01/18/2019   Lab Results  Component Value Date   MICROALBUR <0.7 02/16/2018

## 2019-01-25 LAB — CYTOLOGY - PAP
Diagnosis: NEGATIVE
HPV: NOT DETECTED

## 2019-01-29 ENCOUNTER — Ambulatory Visit
Admission: RE | Admit: 2019-01-29 | Discharge: 2019-01-29 | Disposition: A | Discharge status: Home / Self Care | Payer: No Typology Code available for payment source | Source: Ambulatory Visit | Attending: Internal Medicine | Admitting: Internal Medicine

## 2019-01-29 DIAGNOSIS — N6459 Other signs and symptoms in breast: Secondary | ICD-10-CM

## 2019-02-04 ENCOUNTER — Encounter: Payer: Self-pay | Admitting: Internal Medicine

## 2019-02-04 DIAGNOSIS — M25512 Pain in left shoulder: Secondary | ICD-10-CM

## 2019-02-04 DIAGNOSIS — G8929 Other chronic pain: Secondary | ICD-10-CM

## 2019-02-27 ENCOUNTER — Other Ambulatory Visit (INDEPENDENT_AMBULATORY_CARE_PROVIDER_SITE_OTHER): Payer: No Typology Code available for payment source

## 2019-02-27 ENCOUNTER — Other Ambulatory Visit: Payer: Self-pay

## 2019-02-27 DIAGNOSIS — E1169 Type 2 diabetes mellitus with other specified complication: Secondary | ICD-10-CM | POA: Diagnosis not present

## 2019-02-27 DIAGNOSIS — R7401 Elevation of levels of liver transaminase levels: Secondary | ICD-10-CM

## 2019-02-27 DIAGNOSIS — E785 Hyperlipidemia, unspecified: Secondary | ICD-10-CM

## 2019-02-27 LAB — HEPATIC FUNCTION PANEL
ALT: 29 U/L (ref 0–35)
AST: 17 U/L (ref 0–37)
Albumin: 4.1 g/dL (ref 3.5–5.2)
Alkaline Phosphatase: 43 U/L (ref 39–117)
Bilirubin, Direct: 0.1 mg/dL (ref 0.0–0.3)
Total Bilirubin: 0.5 mg/dL (ref 0.2–1.2)
Total Protein: 6.7 g/dL (ref 6.0–8.3)

## 2019-02-27 LAB — LIPID PANEL
Cholesterol: 178 mg/dL (ref 0–200)
HDL: 37.1 mg/dL — ABNORMAL LOW (ref >39.00)
NonHDL: 141.14
Total CHOL/HDL Ratio: 5
Triglycerides: 247 mg/dL — ABNORMAL HIGH (ref 0.0–149.0)
VLDL: 49.4 mg/dL — ABNORMAL HIGH (ref 0.0–40.0)

## 2019-02-27 LAB — LDL CHOLESTEROL, DIRECT: Direct LDL: 110 mg/dL

## 2019-03-06 ENCOUNTER — Ambulatory Visit: Payer: No Typology Code available for payment source | Admitting: Family Medicine

## 2019-03-18 ENCOUNTER — Telehealth: Payer: No Typology Code available for payment source | Admitting: Family

## 2019-03-18 DIAGNOSIS — R399 Unspecified symptoms and signs involving the genitourinary system: Secondary | ICD-10-CM | POA: Diagnosis not present

## 2019-03-18 MED ORDER — CEPHALEXIN 500 MG PO CAPS: 500.0000 mg | ORAL_CAPSULE | Freq: Two times a day (BID) | ORAL | 0 refills | Status: DC

## 2019-03-18 NOTE — Progress Notes (Signed)

## 2019-03-24 NOTE — Progress Notes (Signed)
[name] [name], [place] [address] Phone: [phone] Subjective:   I Lisa [name] am serving as a Neurosurgeon for Dr. Antoine [name].   CC: left shoulder   SUP:[id number]  Lisa [name] is a 43 y.o. female coming in with complaint of left shoulder pain. Some numbness and tingling. Patient states she has noted muscle weakness. Grip strength as well.   Onset- Chronic  Location - joint, cervical Character- achy  Aggravating factors- a lot of arm use Reliving factors-  Therapies tried- ice, heat, oral, topical  Severity- 8/10 at its worse      Past Medical History:  Diagnosis Date  . Bronchitis    hx of  . Complication of anesthesia   . Dysrhythmia    "does not see cardiologist"  . Family history of anesthesia complication    "morphine night terrors"  . GERD (gastroesophageal reflux disease)   . Headache(784.0)    "migraines"  . Hypertension   . PONV (postoperative nausea and vomiting)   . Shortness of breath    "wheezing"   Past Surgical History:  Procedure Laterality Date  . CESAREAN SECTION     x 2  . MICROLARYNGOSCOPY WITH LASER AND BALLOON DILATION N/A 07/20/2012   Procedure: MICROLARYNGOSCOPY WITH LASER AND BALLOON DILATION;  Surgeon: Lisa Reading, MD;  Location: [facility] OR;  Service: ENT;  Laterality: N/A;  WITH JET VENTURI VENTILATION  . TONGUE FLAP RELEASE    . widom extractions     Social History   Socioeconomic History  . Marital status: Married    Spouse name: Not on file  . Number of children: Not on file  . Years of education: Not on file  . Highest education level: Not on file  Occupational History  . Not on file  Social Needs  . Financial resource strain: Not on file  . Food insecurity    Worry: Not on file    Inability: Not on file  . Transportation needs    Medical: Not on file    Non-medical: Not on file  Tobacco Use  . Smoking status: Never Smoker  . Smokeless tobacco: Never Used  Substance  and Sexual Activity  . Alcohol use: Yes    Comment: "social"  . Drug use: No  . Sexual activity: Yes    Birth control/protection: Implant  Lifestyle  . Physical activity    Days per week: Not on file    Minutes per session: Not on file  . Stress: Not on file  Relationships  . Social Musician on phone: Not on file    Gets together: Not on file    Attends religious service: Not on file    Active member of club or organization: Not on file    Attends meetings of clubs or organizations: Not on file    Relationship status: Not on file  Other Topics Concern  . Not on file  Social History Narrative  . Not on file   Allergies  Allergen Reactions  . Latex Hives    Rash and hives   Family History  Problem Relation Age of Onset  . Hypertension Mother   . Hyperlipidemia Mother   . Arthritis Mother   . Hypertension Father   . Hyperlipidemia Father   . Breast cancer Neg Hx     Current Outpatient Medications (Endocrine & Metabolic):  .  etonogestrel (NEXPLANON) 68 MG IMPL implant, 1 each by Subdermal route once.  Current Outpatient Medications (Respiratory):  .  cetirizine (ZYRTEC) 10 MG tablet, Take 10 mg by mouth daily. .  fluticasone (FLONASE ALLERGY RELIEF) 50 MCG/ACT nasal spray, Place 2 sprays into both nostrils daily.    Current Outpatient Medications (Other):  Marland Kitchen  DENAVIR 1 % cream, APPLY TOPICALLY EVERY TWO HOURS FOR 4 DAYS AS NEEDED FOR COLD SORES .  omeprazole (PRILOSEC) 40 MG capsule, Take 1 capsule (40 mg total) by mouth daily. Marland Kitchen  tiZANidine (ZANAFLEX) 4 MG capsule, Take 1 capsule (4 mg total) by mouth 3 (three) times daily. Marland Kitchen  gabapentin (NEURONTIN) 100 MG capsule, Take 2 capsules (200 mg total) by mouth at bedtime.    Past medical history, social, surgical and family history all reviewed in electronic medical record.  No pertanent information unless stated regarding to the chief complaint.   Review of Systems:  No headache, visual changes,  nausea, vomiting, diarrhea, constipation, dizziness, abdominal pain, skin rash, fevers, chills, night sweats, weight loss, swollen lymph nodes, body aches, joint swelling, , chest pain, shortness of breath, mood changes.  Positive muscle aches  Objective  Blood pressure 110/70, pulse 99, height 5\' 4"  (1.626 m), SpO2 98 %.   General: No apparent distress alert and oriented x3 mood and affect normal, dressed appropriately.  HEENT: Pupils equal, extraocular movements intact  Respiratory: Patient's speak in full sentences and does not appear short of breath  Cardiovascular: No lower extremity edema, non tender, no erythema  Skin: Warm dry intact with no signs of infection or rash on extremities or on axial skeleton.  Abdomen: Soft nontender  Neuro: Cranial nerves II through XII are intact, neurovascularly intact in all extremities with 2+ DTRs and 2+ pulses.  Lymph: No lymphadenopathy of posterior or anterior cervical chain or axillae bilaterally.  Gait normal with good balance and coordination.  MSK:  Non tender with full range of motion and good stability and symmetric strength and tone of  elbows, wrist, hip, knee and ankles bilaterally.  Shoulder: Left Inspection reveals no abnormalities, atrophy or asymmetry. Palpation is normal with no tenderness over AC joint or bicipital groove. ROM is full in all planes. Rotator cuff strength normal throughout. No signs of impingement with negative Neer and Hawkin's tests, empty can sign. Speeds and Yergason's tests normal. No labral pathology noted with negative Obrien's, negative clunk and good stability. Normal scapular function observed. No painful arc and no drop arm sign. No apprehension sign   Neck exam has good range of motion but positive Spurling's on the left side.  Patient does have some mild weakness noted in the C7-C8 distribution on the left side.  Deep tendon reflexes though are intact.  [id number]; 15 additional minutes spent for  Therapeutic exercises as stated in above notes.  This included exercises focusing on stretching, strengthening, with significant focus on eccentric aspects.   Long term goals include an improvement in range of motion, strength, endurance as well as avoiding reinjury. Patient's frequency would include in 1-2 times a day, 3-5 times a week for a duration of 6-12 weeks.  Exercises that included:  Basic scapular stabilization to include adduction and depression of scapula Scaption, focusing on proper movement and good control Internal and External rotation utilizing a theraband, with elbow tucked at side entire time Rows with theraband  Proper technique shown and discussed handout in great detail with ATC.  All questions were discussed and answered.     Impression and Recommendations:     This case required medical decision making  of moderate complexity. The above documentation has been reviewed and is accurate and complete Lyndal Pulley, DO       Note: This dictation was prepared with Dragon dictation along with smaller phrase technology. Any transcriptional errors that result from this process are unintentional.

## 2019-03-25 NOTE — Progress Notes (Signed)
Pt is present for Nexplanon removal and reinsertion. Pt stated that she was doing well no problems or concerns.

## 2019-03-26 ENCOUNTER — Encounter: Payer: Self-pay | Admitting: Family Medicine

## 2019-03-26 ENCOUNTER — Ambulatory Visit (INDEPENDENT_AMBULATORY_CARE_PROVIDER_SITE_OTHER): Payer: No Typology Code available for payment source | Admitting: Family Medicine

## 2019-03-26 ENCOUNTER — Encounter: Payer: Self-pay | Admitting: Obstetrics and Gynecology

## 2019-03-26 ENCOUNTER — Ambulatory Visit (INDEPENDENT_AMBULATORY_CARE_PROVIDER_SITE_OTHER): Payer: No Typology Code available for payment source | Admitting: Obstetrics and Gynecology

## 2019-03-26 ENCOUNTER — Ambulatory Visit (INDEPENDENT_AMBULATORY_CARE_PROVIDER_SITE_OTHER)
Admission: RE | Admit: 2019-03-26 | Discharge: 2019-03-26 | Disposition: A | Discharge status: Home / Self Care | Payer: No Typology Code available for payment source | Source: Ambulatory Visit | Attending: Family Medicine | Admitting: Family Medicine

## 2019-03-26 ENCOUNTER — Other Ambulatory Visit: Payer: Self-pay

## 2019-03-26 VITALS — BP 108/71 | HR 74 | Ht 64.0 in | Wt 270.0 lb

## 2019-03-26 VITALS — BP 110/70 | HR 99 | Ht 64.0 in

## 2019-03-26 DIAGNOSIS — Z3049 Encounter for surveillance of other contraceptives: Secondary | ICD-10-CM

## 2019-03-26 DIAGNOSIS — M5412 Radiculopathy, cervical region: Secondary | ICD-10-CM

## 2019-03-26 DIAGNOSIS — M542 Cervicalgia: Secondary | ICD-10-CM

## 2019-03-26 DIAGNOSIS — Z3046 Encounter for surveillance of implantable subdermal contraceptive: Secondary | ICD-10-CM

## 2019-03-26 MED ORDER — GABAPENTIN 100 MG PO CAPS: 200.0000 mg | ORAL_CAPSULE | Freq: Every day | ORAL | 3 refills | Status: DC

## 2019-03-26 NOTE — Patient Instructions (Signed)
NEXPLANON PLACEMENT POST-PROCEDURE INSTRUCTIONS ° °1. You may take Ibuprofen, Aleve or Tylenol for pain if needed.  Pain should resolve within in 24 hours. ° °2. You may have intercourse after 24 hours.  If you using this for birth control, it is effective immediately. ° °3. You need to call if you have any fever, heavy bleeding, or redness at insertion site. Irregular bleeding is common the first several months after having a Nexplanonplaced. You do not need to call for this reason unless you are concerned. ° °4. Shower or bathe as normal.  You can remove the bandage after 24 hours. ° °

## 2019-03-26 NOTE — Assessment & Plan Note (Signed)
Patient does have a left-sided cervical radiculopathy.  Concern for weakness.  Patient has some mild weakness in the C7 and C8 distribution and we will continue to monitor.  And gabapentin given, home exercise, discussed which activities to avoid and posture and ergonomics, follow-up again in 4 to 8 weeks

## 2019-03-26 NOTE — Patient Instructions (Signed)
Good to see you.  Ice 20 minutes 2 times daily. Usually after activity and before bed. Exercises 3 times a week.  Gabapentin 200 mg at night  Turmeric 500mg daily  Tart cherry extract 1200mg at night Vitamin D 2000 IU daily  See me again in 5-6 weeks  

## 2019-03-26 NOTE — Progress Notes (Signed)
\     GYNECOLOGY OFFICE PROCEDURE NOTE  Lisa [name] is a 43 y.o.  here for Nexplanon removal and new Nexplanon insertion.  Last pap smear was on 12/2018 (performed by PCP) 01/2019 and was normal.  No other gynecologic concerns.   Nexplanon Removal and Reinsertion Patient identified, informed consent performed, consent signed.   Patient does understand that irregular bleeding is a very common side effect of this medication. She was advised to have backup contraception for one week after replacement of the implant. Pregnancy test in clinic today was negative.  Appropriate time out taken. Nexplanon site identified in left arm.  Area prepped in usual sterile fashon. One ml of 1% lidocaine was used to anesthetize the area at the distal end of the implant. A small stab incision was made right beside the implant on the distal portion. The Nexplanon rod was grasped using hemostats and removed without difficulty. There was minimal blood loss. There were no complications. Area was then injected with 3 ml of 1 % lidocaine. She was re-prepped with betadine, Nexplanon removed from packaging, Device confirmed in needle, then inserted full length of needle and withdrawn per handbook instructions. Nexplanon was able to palpated in the patient's arm; patient palpated the insert herself.  There was minimal blood loss. Patient insertion site covered with gauze and a pressure bandage to reduce any bruising. The patient tolerated the procedure well and was given post procedure instructions.  She was advised to have backup contraception for one week.      Exp: 04/12/2021 Lot: [id number]   Rubie Maid, MD [facility]

## 2019-04-09 ENCOUNTER — Other Ambulatory Visit: Payer: Self-pay | Admitting: Internal Medicine

## 2019-04-29 ENCOUNTER — Ambulatory Visit: Payer: No Typology Code available for payment source | Admitting: Family Medicine

## 2019-04-29 ENCOUNTER — Encounter: Payer: Self-pay | Admitting: Family Medicine

## 2019-04-29 ENCOUNTER — Other Ambulatory Visit: Payer: Self-pay

## 2019-04-29 DIAGNOSIS — M5412 Radiculopathy, cervical region: Secondary | ICD-10-CM

## 2019-04-29 DIAGNOSIS — M999 Biomechanical lesion, unspecified: Secondary | ICD-10-CM | POA: Insufficient documentation

## 2019-04-29 MED ORDER — GABAPENTIN 100 MG PO CAPS: 200.0000 mg | ORAL_CAPSULE | Freq: Every day | ORAL | 3 refills | Status: DC

## 2019-04-29 NOTE — Assessment & Plan Note (Signed)
No longer any weakness at the moment.  Patient is to continue to do the gabapentin at this moment but can titrate from 200 to 300 mg.  Discussed posture and ergonomics.  We discussed which activities to do which wants to avoid.  Attempted osteopathic manipulation today.  Follow-up again in 4 to 6 weeks

## 2019-04-29 NOTE — Assessment & Plan Note (Signed)
Decision today to treat with OMT was based on Physical Exam  After verbal consent patient was treated with HVLA, ME, FPR techniques in cervical, thoracic, rib areas  Patient tolerated the procedure well with improvement in symptoms  Patient given exercises, stretches and lifestyle modifications  See medications in patient instructions if given  Patient will follow up in 4-6 weeks 

## 2019-04-29 NOTE — Progress Notes (Signed)
[name] [address] [place] [place], [place] [address] Phone: [phone] Subjective:   I Lisa [name] am serving as a Neurosurgeon for Dr. Antoine Primas.   CC: Neck pain follow-up  AST:[id number]  Lisa [name] is a 43 y.o. female coming in with complaint of neck pain. Patient states her pain is not as severe as before.  Patient is making progress overall.  Has been doing the conservative therapy fairly regularly.   Recently found to have more weakness in the C7 and C8 distribution.  Patient did have x-rays taken March 26, 2019.  Probable narrowing of the facet joints bilaterally mostly from C3-C6.  Past Medical History:  Diagnosis Date  . Bronchitis    hx of  . Complication of anesthesia   . Dysrhythmia    "does not see cardiologist"  . Family history of anesthesia complication    "morphine night terrors"  . GERD (gastroesophageal reflux disease)   . Headache(784.0)    "migraines"  . Hypertension   . PONV (postoperative nausea and vomiting)   . Shortness of breath    "wheezing"   Past Surgical History:  Procedure Laterality Date  . CESAREAN SECTION     x 2  . MICROLARYNGOSCOPY WITH LASER AND BALLOON DILATION N/A 07/20/2012   Procedure: MICROLARYNGOSCOPY WITH LASER AND BALLOON DILATION;  Surgeon: Christia Reading, MD;  Location: [facility] OR;  Service: ENT;  Laterality: N/A;  WITH JET VENTURI VENTILATION  . TONGUE FLAP RELEASE    . widom extractions     Social History   Socioeconomic History  . Marital status: Married    Spouse name: Not on file  . Number of children: Not on file  . Years of education: Not on file  . Highest education level: Not on file  Occupational History  . Not on file  Tobacco Use  . Smoking status: Never Smoker  . Smokeless tobacco: Never Used  Substance and Sexual Activity  . Alcohol use: Yes    Comment: "social"  . Drug use: No  . Sexual activity: Yes    Birth control/protection: Implant  Other Topics Concern  .  Not on file  Social History Narrative  . Not on file   Social Determinants of Health   Financial Resource Strain:   . Difficulty of Paying Living Expenses: Not on file  Food Insecurity:   . Worried About Programme researcher, broadcasting/film/video in the Last Year: Not on file  . Ran Out of Food in the Last Year: Not on file  Transportation Needs:   . Lack of Transportation (Medical): Not on file  . Lack of Transportation (Non-Medical): Not on file  Physical Activity:   . Days of Exercise per Week: Not on file  . Minutes of Exercise per Session: Not on file  Stress:   . Feeling of Stress : Not on file  Social Connections:   . Frequency of Communication with Friends and Family: Not on file  . Frequency of Social Gatherings with Friends and Family: Not on file  . Attends Religious Services: Not on file  . Active Member of Clubs or Organizations: Not on file  . Attends Banker Meetings: Not on file  . Marital Status: Not on file   Allergies  Allergen Reactions  . Latex Hives    Rash and hives   Family History  Problem Relation Age of Onset  . Hypertension Mother   . Hyperlipidemia Mother   . Arthritis Mother   .  Hypertension Father   . Hyperlipidemia Father   . Breast cancer Neg Hx     Current Outpatient Medications (Endocrine & Metabolic):  .  etonogestrel (NEXPLANON) 68 MG IMPL implant, 1 each by Subdermal route once.   Current Outpatient Medications (Respiratory):  .  cetirizine (ZYRTEC) 10 MG tablet, Take 10 mg by mouth daily. .  fluticasone (FLONASE) 50 MCG/ACT nasal spray, PLACE 2 SPRAYS INTO BOTH NOSTRILS DAILY    Current Outpatient Medications (Other):  Marland Kitchen  DENAVIR 1 % cream, APPLY TOPICALLY EVERY TWO HOURS FOR 4 DAYS AS NEEDED FOR COLD SORES .  gabapentin (NEURONTIN) 100 MG capsule, Take 2 capsules (200 mg total) by mouth at bedtime. Marland Kitchen  omeprazole (PRILOSEC) 40 MG capsule, Take 1 capsule (40 mg total) by mouth daily. Marland Kitchen  tiZANidine (ZANAFLEX) 4 MG capsule, Take 1  capsule (4 mg total) by mouth 3 (three) times daily.    Past medical history, social, surgical and family history all reviewed in electronic medical record.  No pertanent information unless stated regarding to the chief complaint.   Review of Systems:  No headache, visual changes, nausea, vomiting, diarrhea, constipation, dizziness, abdominal pain, skin rash, fevers, chills, night sweats, weight loss, swollen lymph nodes, body aches, joint swelling, chest pain, shortness of breath, mood changes.   Positive muscle aches  Objective  Blood pressure 94/60, pulse 78, SpO2 97 %.     General: No apparent distress alert and oriented x3 mood and affect normal, dressed appropriately.  HEENT: Pupils equal, extraocular movements intact  Respiratory: Patient's speak in full sentences and does not appear short of breath  Cardiovascular: No lower extremity edema, non tender, no erythema  Skin: Warm dry intact with no signs of infection or rash on extremities or on axial skeleton.  Abdomen: Soft nontender  Neuro: Cranial nerves II through XII are intact, neurovascularly intact in all extremities with 2+ DTRs and 2+ pulses.  Lymph: No lymphadenopathy of posterior or anterior cervical chain or axillae bilaterally.  Gait normal with good balance and coordination.  MSK:  Non tender with full range of motion and good stability and symmetric strength and tone of shoulders, elbows, wrist, hip, knee and ankles bilaterally.  Neck: Inspection loss of lordosis. No palpable stepoffs. Negative Spurling's maneuver. Range of motion lacking last 5 to 10 degrees sidebending bilaterally. Grip strength and sensation normal in bilateral hands Strength good C4 to T1 distribution No sensory change to C4 to T1 Negative Hoffman sign bilaterally Reflexes normal Mild tightness in the trapezius bilaterally  Osteopathic findings  C2 flexed rotated and side bent right T5 extended rotated and side bent right inhaled  rib     Impression and Recommendations:     This case required medical decision making of moderate complexity. The above documentation has been reviewed and is accurate and complete Lisa Pulley, DO       Note: This dictation was prepared with Dragon dictation along with smaller phrase technology. Any transcriptional errors that result from this process are unintentional.

## 2019-04-29 NOTE — Patient Instructions (Signed)
  [address] 1st floor [place], [place] [address] Phone [phone]  Gabapentin 1-3 pills at night See me again in 4 weeks

## 2019-07-04 ENCOUNTER — Other Ambulatory Visit: Payer: Self-pay

## 2019-07-04 ENCOUNTER — Ambulatory Visit (INDEPENDENT_AMBULATORY_CARE_PROVIDER_SITE_OTHER)
Admission: RE | Admit: 2019-07-04 | Discharge: 2019-07-04 | Disposition: A | Discharge status: Home / Self Care | Payer: No Typology Code available for payment source | Source: Ambulatory Visit

## 2019-07-04 DIAGNOSIS — J029 Acute pharyngitis, unspecified: Secondary | ICD-10-CM | POA: Diagnosis not present

## 2019-07-04 DIAGNOSIS — H9202 Otalgia, left ear: Secondary | ICD-10-CM | POA: Diagnosis not present

## 2019-07-04 MED ORDER — AMOXICILLIN 875 MG PO TABS: 875.0000 mg | ORAL_TABLET | Freq: Two times a day (BID) | ORAL | 0 refills | Status: AC

## 2019-07-04 NOTE — ED Provider Notes (Signed)
Virtual Visit via Video Note:  Lisa [name]  initiated request for Telemedicine visit with [facility] team. I connected with Lisa [name]  on 07/04/2019 at 10:40 AM  for a synchronized telemedicine visit using a video enabled HIPPA compliant telemedicine application. I verified that I am speaking with Lisa [name]  using two identifiers. Sharion Balloon, NP  was physically located in a [facility]gent care site and Lisa [name] was located at a different location.   The limitations of evaluation and management by telemedicine as well as the availability of in-person appointments were discussed. Patient was informed that she  may incur a bill ( including co-pay) for this virtual visit encounter. Lisa [name]  expressed understanding and gave verbal consent to proceed with virtual visit.     History of Present Illness:Lisa [name]  is a 44 y.o. female presents for evaluation of sore throat, headache, and left ear pain x 2 weeks, worse x 2 days.  She has attempted treatment at home with ibuprofen, Flonase, and Zyrtec.  She denies fever, chills, difficulty swallowing, rash, cough, shortness of breath, vomiting, diarrhea, or other symptoms.  She denies pregnancy or breastfeeding.     Allergies  Allergen Reactions  . Latex Hives    Rash and hives     Past Medical History:  Diagnosis Date  . Bronchitis    hx of  . Complication of anesthesia   . Dysrhythmia    "does not see cardiologist"  . Family history of anesthesia complication    "morphine night terrors"  . GERD (gastroesophageal reflux disease)   . Headache(784.0)    "migraines"  . Hypertension   . PONV (postoperative nausea and vomiting)   . Shortness of breath    "wheezing"     Social History   Tobacco Use  . Smoking status: Never Smoker  . Smokeless tobacco: Never Used  Substance Use Topics  . Alcohol use: Yes    Comment: "social"  . Drug use: No    ROS: as stated in HPI.   All other systems reviewed and negative.      Observations/Objective: Physical Exam  VITALS: Patient denies fever. GENERAL: Alert, appears well and in no acute distress. HEENT: Atraumatic. NECK: Normal movements of the head and neck. CARDIOPULMONARY: No increased WOB. Speaking in clear sentences. I:E ratio WNL.  MS: Moves all visible extremities without noticeable abnormality. PSYCH: Pleasant and cooperative, well-groomed. Speech normal rate and rhythm. Affect is appropriate. Insight and judgement are appropriate. Attention is focused, linear, and appropriate.  NEURO: CN grossly intact. Oriented as arrived to appointment on time with no prompting. Moves both UE equally.  SKIN: No obvious lesions, wounds, erythema, or cyanosis noted on face or hands.   Assessment and Plan:    ICD-10-CM   1. Sore throat  J02.9   2. Left ear pain  H92.02        Follow Up Instructions: Due to the length of time the patient has been experiencing her symptoms, treating with amoxicillin.  Also suggested ibuprofen as needed.  Instructed patient to follow-up with her PCP or come here to be seen in person if her symptoms are not improving.  Patient agrees to plan of care.    I discussed the assessment and treatment plan with the patient. The patient was provided an opportunity to ask questions and all were answered. The patient agreed with the plan and demonstrated an understanding of the instructions.  The patient was advised to call back or seek an in-person evaluation if the symptoms worsen or if the condition fails to improve as anticipated.      Mickie Bail, NP  07/04/2019 10:40 AM         Mickie Bail, NP 07/04/19 1041

## 2019-07-04 NOTE — Discharge Instructions (Signed)
Take the amoxicillin as directed.  Take ibuprofen as needed for fever or discomfort.    Follow up with your primary care provider or come here to be seen in person if your symptoms are not improving.

## 2019-07-05 ENCOUNTER — Encounter: Payer: Self-pay | Admitting: Internal Medicine

## 2019-07-08 ENCOUNTER — Encounter: Payer: No Typology Code available for payment source | Admitting: Internal Medicine

## 2019-07-19 ENCOUNTER — Telehealth: Payer: No Typology Code available for payment source | Admitting: Nurse Practitioner

## 2019-07-23 ENCOUNTER — Telehealth: Payer: Self-pay | Admitting: *Deleted

## 2019-07-23 DIAGNOSIS — R7401 Elevation of levels of liver transaminase levels: Secondary | ICD-10-CM

## 2019-07-23 DIAGNOSIS — E119 Type 2 diabetes mellitus without complications: Secondary | ICD-10-CM

## 2019-07-23 NOTE — Telephone Encounter (Signed)
Please place future orders for lab appt.  

## 2019-07-24 ENCOUNTER — Other Ambulatory Visit (INDEPENDENT_AMBULATORY_CARE_PROVIDER_SITE_OTHER): Payer: No Typology Code available for payment source

## 2019-07-24 ENCOUNTER — Other Ambulatory Visit: Payer: Self-pay

## 2019-07-24 DIAGNOSIS — E119 Type 2 diabetes mellitus without complications: Secondary | ICD-10-CM | POA: Diagnosis not present

## 2019-07-24 DIAGNOSIS — R7401 Elevation of levels of liver transaminase levels: Secondary | ICD-10-CM | POA: Diagnosis not present

## 2019-07-24 LAB — LIPID PANEL
Cholesterol: 197 mg/dL (ref 0–200)
HDL: 41.9 mg/dL (ref >39.00)
LDL Cholesterol: 117 mg/dL — ABNORMAL HIGH (ref 0–99)
NonHDL: 154.8
Total CHOL/HDL Ratio: 5
Triglycerides: 191 mg/dL — ABNORMAL HIGH (ref 0.0–149.0)
VLDL: 38.2 mg/dL (ref 0.0–40.0)

## 2019-07-24 LAB — MICROALBUMIN / CREATININE URINE RATIO
Creatinine,U: 148.4 mg/dL
Microalb Creat Ratio: 0.6 mg/g (ref 0.0–30.0)
Microalb, Ur: 0.9 mg/dL (ref 0.0–1.9)

## 2019-07-24 LAB — COMPREHENSIVE METABOLIC PANEL
ALT: 20 U/L (ref 0–35)
AST: 16 U/L (ref 0–37)
Albumin: 4.1 g/dL (ref 3.5–5.2)
Alkaline Phosphatase: 52 U/L (ref 39–117)
BUN: 16 mg/dL (ref 6–23)
CO2: 27 mEq/L (ref 19–32)
Calcium: 9.3 mg/dL (ref 8.4–10.5)
Chloride: 106 mEq/L (ref 96–112)
Creatinine, Ser: 0.79 mg/dL (ref 0.40–1.20)
GFR: 79.6 mL/min (ref >60.00)
Glucose, Bld: 124 mg/dL — ABNORMAL HIGH (ref 70–99)
Potassium: 4.4 mEq/L (ref 3.5–5.1)
Sodium: 138 mEq/L (ref 135–145)
Total Bilirubin: 0.6 mg/dL (ref 0.2–1.2)
Total Protein: 6.6 g/dL (ref 6.0–8.3)

## 2019-07-24 LAB — HEMOGLOBIN A1C: Hgb A1c MFr Bld: 6 % (ref 4.6–6.5)

## 2019-07-31 ENCOUNTER — Ambulatory Visit (INDEPENDENT_AMBULATORY_CARE_PROVIDER_SITE_OTHER): Payer: No Typology Code available for payment source | Admitting: Internal Medicine

## 2019-07-31 ENCOUNTER — Encounter: Payer: Self-pay | Admitting: Internal Medicine

## 2019-07-31 ENCOUNTER — Other Ambulatory Visit: Payer: Self-pay

## 2019-07-31 VITALS — BP 114/80 | HR 97 | Temp 97.5°F | Resp 15 | Ht 64.0 in | Wt 278.8 lb

## 2019-07-31 DIAGNOSIS — E1169 Type 2 diabetes mellitus with other specified complication: Secondary | ICD-10-CM | POA: Insufficient documentation

## 2019-07-31 DIAGNOSIS — E119 Type 2 diabetes mellitus without complications: Secondary | ICD-10-CM | POA: Diagnosis not present

## 2019-07-31 DIAGNOSIS — E785 Hyperlipidemia, unspecified: Secondary | ICD-10-CM

## 2019-07-31 DIAGNOSIS — R5383 Other fatigue: Secondary | ICD-10-CM

## 2019-07-31 DIAGNOSIS — R0683 Snoring: Secondary | ICD-10-CM

## 2019-07-31 DIAGNOSIS — R7401 Elevation of levels of liver transaminase levels: Secondary | ICD-10-CM

## 2019-07-31 DIAGNOSIS — E538 Deficiency of other specified B group vitamins: Secondary | ICD-10-CM

## 2019-07-31 DIAGNOSIS — Z6841 Body Mass Index (BMI) 40.0 and over, adult: Secondary | ICD-10-CM

## 2019-07-31 DIAGNOSIS — H60542 Acute eczematoid otitis externa, left ear: Secondary | ICD-10-CM

## 2019-07-31 MED ORDER — MOMETASONE FUROATE 0.1 % EX CREA: 1.0000 "application " | TOPICAL_CREAM | Freq: Every day | CUTANEOUS | 0 refills | Status: DC

## 2019-07-31 MED ORDER — PROPRANOLOL HCL 10 MG PO TABS: ORAL_TABLET | ORAL | 0 refills | Status: DC

## 2019-07-31 MED ORDER — FLUTICASONE PROPIONATE 50 MCG/ACT NA SUSP: 2.0000 | Freq: Every day | NASAL | 2 refills | Status: DC

## 2019-07-31 NOTE — Patient Instructions (Addendum)
Mometasone ointment for itchy ears  Sleep study  flonase refilled   Your diabetes remains under excellent control currently without medication and your cholesterol is fine   Please return in 6 months for follow up on diabetes and make sure you are seeing your eye doctor at least once a year.

## 2019-07-31 NOTE — Assessment & Plan Note (Addendum)
Improved control.  Encouraged to continue weight loss with next goal of 24 lbs  through low GI diet and exercise and repeat surveillance labs every  6 months.    Lab Results  Component Value Date   HGBA1C 6.0 07/24/2019   Lab Results  Component Value Date   MICROALBUR 0.9 07/24/2019

## 2019-07-31 NOTE — Progress Notes (Signed)
Subjective:  Patient ID: Lisa [name], female    DOB: 01-Nov-1975  Age: 44 y.o. MRN: [id number]  CC: The primary encounter diagnosis was Fatigue, unspecified type. Diagnoses of Type 2 diabetes mellitus without complication, without long-term current use of insulin (HCC), Hyperlipidemia associated with type 2 diabetes mellitus (HCC), Loud snoring, Elevated ALT measurement, Class 3 severe obesity without serious comorbidity with body mass index (BMI) of 40.0 to 44.9 in adult, unspecified obesity type (HCC), B12 deficiency, and Eczema of left external ear were also pertinent to this visit.  HPI Lisa [name] presents for follow up on type 2 DM , diet controlled, obesity   This visit occurred during the SARS-CoV-2 public health emergency.  Safety protocols were in place, including screening questions prior to the visit, additional usage of staff PPE, and extensive cleaning of exam room while observing appropriate contact time as indicated for disinfecting solutions.    Patient has received both doses of the available COVID 19 vaccine without serious complications  (see below) .  Patient continues to mask when outside of the home except when walking in yard or at safe distances from others .  Patient denies any change in mood or development of unhealthy behaviors resuting from the pandemic's restriction of activities and socialization.    She has been having Fatigue and body aches that started after receiving the  the COVID 19 vaccine. The body aches has resolved,,  But she is excessively fatigues.  She has difficulty with sleep initiation , takes 30 to 60 min.  Stays asleep for 6 to 8 hours .  Snores  Loudly per husband. wakes up snoring occasionally.  Husband also snores and sounds like he has apnea.  She wakes up tired, could nap during the day.  Takes gabapentin at bedtime , has also tried it earlier ,  Doesn't make her sleepy  2) ear pain . Left.  Ears itch constantly.    Lab Results    Component Value Date   TSH 3.64 07/31/2019     Outpatient Medications Prior to Visit  Medication Sig Dispense Refill  . cetirizine (ZYRTEC) 10 MG tablet Take 10 mg by mouth daily.    . cholecalciferol (VITAMIN D3) 25 MCG (1000 UNIT) tablet Take 1,000 Units by mouth daily.    . cyclobenzaprine (FLEXERIL) 5 MG tablet Take 5 mg by mouth 3 (three) times daily as needed.    Marland Kitchen DENAVIR 1 % cream APPLY TOPICALLY EVERY TWO HOURS FOR 4 DAYS AS NEEDED FOR COLD SORES 5 g 0  . etonogestrel (NEXPLANON) 68 MG IMPL implant 1 each by Subdermal route once.    . gabapentin (NEURONTIN) 100 MG capsule Take 2 capsules (200 mg total) by mouth at bedtime. 180 capsule 3  . Misc Natural Products (TART CHERRY ADVANCED PO) Take 1 capsule by mouth daily.    Marland Kitchen omeprazole (PRILOSEC) 40 MG capsule Take 1 capsule (40 mg total) by mouth daily. 90 capsule 1  . tiZANidine (ZANAFLEX) 4 MG capsule Take 1 capsule (4 mg total) by mouth 3 (three) times daily. 90 capsule 0  . Turmeric 400 MG CAPS Take 1 capsule by mouth.    . fluticasone (FLONASE) 50 MCG/ACT nasal spray PLACE 2 SPRAYS INTO BOTH NOSTRILS DAILY 16 g 2   No facility-administered medications prior to visit.    Review of Systems;  Patient denies headache, fevers, malaise, unintentional weight loss, skin rash, eye pain, sinus congestion and sinus pain, sore throat, dysphagia,  hemoptysis , cough,  dyspnea, wheezing, chest pain, palpitations, orthopnea, edema, abdominal pain, nausea, melena, diarrhea, constipation, flank pain, dysuria, hematuria, urinary  Frequency, nocturia, numbness, tingling, seizures,  Focal weakness, Loss of consciousness,  Tremor, insomnia, depression, anxiety, and suicidal ideation.      Objective:  BP 114/80 (BP Location: Left Arm, Patient Position: Sitting, Cuff Size: Normal)   Pulse 97   Temp (!) 97.5 F (36.4 C) (Temporal)   Resp 15   Ht 5\' 4"  (1.626 m)   Wt 278 lb 12.8 oz (126.5 kg)   SpO2 98%   BMI 47.86 kg/m   BP Readings from  Last 3 Encounters:  07/31/19 114/80  04/29/19 94/60  03/26/19 110/70    Wt Readings from Last 3 Encounters:  07/31/19 278 lb 12.8 oz (126.5 kg)  07/05/19 272 lb (123.4 kg)  03/26/19 270 lb (122.5 kg)    General appearance: alert, cooperative and appears stated age Ears: normal TM's and external ear canals both ears Throat: lips, mucosa, and tongue normal; teeth and gums normal Neck: no adenopathy, no carotid bruit, supple, symmetrical, trachea midline and thyroid not enlarged, symmetric, no tenderness/mass/nodules Back: symmetric, no curvature. ROM normal. No CVA tenderness. Lungs: clear to auscultation bilaterally Heart: regular rate and rhythm, S1, S2 normal, no murmur, click, rub or gallop Abdomen: soft, non-tender; bowel sounds normal; no masses,  no organomegaly Pulses: 2+ and symmetric Skin: Skin color, texture, turgor normal. No rashes or lesions Lymph nodes: Cervical, supraclavicular, and axillary nodes normal.  Lab Results  Component Value Date   HGBA1C 6.0 07/24/2019   HGBA1C 6.2 01/18/2019   HGBA1C 5.8 02/16/2018    Lab Results  Component Value Date   CREATININE 0.79 07/24/2019   CREATININE 0.76 01/18/2019   CREATININE 0.69 02/16/2018    Lab Results  Component Value Date   WBC 12.2 (H) 07/31/2019   HGB 14.1 07/31/2019   HCT 42.6 07/31/2019   PLT 252.0 07/31/2019   GLUCOSE 124 (H) 07/24/2019   CHOL 197 07/24/2019   TRIG 191.0 (H) 07/24/2019   HDL 41.90 07/24/2019   LDLDIRECT 110.0 02/27/2019   LDLCALC 117 (H) 07/24/2019   ALT 20 07/24/2019   AST 16 07/24/2019   NA 138 07/24/2019   K 4.4 07/24/2019   CL 106 07/24/2019   CREATININE 0.79 07/24/2019   BUN 16 07/24/2019   CO2 27 07/24/2019   TSH 3.64 07/31/2019   HGBA1C 6.0 07/24/2019   MICROALBUR 0.9 07/24/2019    No results found.  Assessment & Plan:   Problem List Items Addressed This Visit      Unprioritized   Class 3 severe obesity without serious comorbidity with body mass index (BMI)  of 40.0 to 44.9 in adult [facility])    I have addressed  BMI and recommended a low glycemic index diet utilizing smaller more frequent meals to increase metabolism.  I have also recommended that patient start exercising with a goal of 30 minutes of aerobic exercise a minimum of 5 days per week, but this has been hindered by her back pain . [facility]   Lab Results  Component Value Date   HGBA1C 6.0 07/24/2019   Lab Results  Component Value Date   TSH 3.64 07/31/2019   Lab Results  Component Value Date   CHOL 197 07/24/2019   HDL 41.90 07/24/2019   LDLCALC 117 (H) 07/24/2019   LDLDIRECT 110.0 02/27/2019   TRIG 191.0 (H) 07/24/2019   CHOLHDL 5 07/24/2019        Type 2 diabetes mellitus  without complications (HCC)    Improved control.  Encouraged to continue weight loss with next goal of 24 lbs  through low GI diet and exercise and repeat surveillance labs every  6 months.    Lab Results  Component Value Date   HGBA1C 6.0 07/24/2019   Lab Results  Component Value Date   MICROALBUR 0.9 07/24/2019         Elevated ALT measurement    Resolved.   Lab Results  Component Value Date   ALT 20 07/24/2019   AST 16 07/24/2019   ALKPHOS 52 07/24/2019   BILITOT 0.6 07/24/2019         Hyperlipidemia associated with type 2 diabetes mellitus ([facility])    Current risk is low at 4%  Lab Results  Component Value Date   CHOL 197 07/24/2019   HDL 41.90 07/24/2019   LDLCALC 117 (H) 07/24/2019   LDLDIRECT 110.0 02/27/2019   TRIG 191.0 (H) 07/24/2019   CHOLHDL 5 07/24/2019         Loud snoring    She has morbid obesity snoring, and daytime fatigue.  Sleep study recommended       B12 deficiency    Found during screening labs for fatigue.  Parenteral therapy recommended and intrinsic factor RBC folate and MMA ordered       Relevant Orders   Intrinsic Factor Antibodies   Methylmalonic Acid   RBC Folate   Eczema of left external ear    Mometasone prescribed        Other Visit  Diagnoses    Fatigue, unspecified type    -  Primary   Relevant Orders   TSH (Completed)   CBC with Differential/Platelet (Completed)   Vitamin B12 (Completed)      I am having Denielle K. Moultrie start on mometasone and propranolol. I am also having her maintain her etonogestrel, cetirizine, Denavir, tiZANidine, omeprazole, gabapentin, cyclobenzaprine, cholecalciferol, Turmeric, Misc Natural Products (TART CHERRY ADVANCED PO), and fluticasone.  Meds ordered this encounter  Medications  . mometasone (ELOCON) 0.1 % cream    Sig: Apply 1 application topically daily.    Dispense:  45 g    Refill:  0  . fluticasone (FLONASE) 50 MCG/ACT nasal spray    Sig: Place 2 sprays into both nostrils daily.    Dispense:  16 g    Refill:  2  . propranolol (INDERAL) 10 MG tablet    Sig: 1/2 tablet as needed for rapid heart rate    Dispense:  30 tablet    Refill:  0    Medications Discontinued During This Encounter  Medication Reason  . fluticasone (FLONASE) 50 MCG/ACT nasal spray Reorder    I provided  30 minutes of face-to-face time during this encounter reviewing patient's current problems and past surgeries, labs and imaging studies, providing counseling on the above mentioned problems , and coordination  of care . Follow-up: No follow-ups on file.   Crecencio Mc, MD

## 2019-08-01 DIAGNOSIS — E538 Deficiency of other specified B group vitamins: Secondary | ICD-10-CM | POA: Insufficient documentation

## 2019-08-01 DIAGNOSIS — H60542 Acute eczematoid otitis externa, left ear: Secondary | ICD-10-CM | POA: Insufficient documentation

## 2019-08-01 DIAGNOSIS — R0683 Snoring: Secondary | ICD-10-CM | POA: Insufficient documentation

## 2019-08-01 HISTORY — DX: Acute eczematoid otitis externa, left ear: H60.542

## 2019-08-01 LAB — CBC WITH DIFFERENTIAL/PLATELET
Basophils Absolute: 0.1 10*3/uL (ref 0.0–0.1)
Basophils Relative: 0.7 % (ref 0.0–3.0)
Eosinophils Absolute: 0.1 10*3/uL (ref 0.0–0.7)
Eosinophils Relative: 0.9 % (ref 0.0–5.0)
HCT: 42.6 % (ref 36.0–46.0)
Hemoglobin: 14.1 g/dL (ref 12.0–15.0)
Lymphocytes Relative: 20.3 % (ref 12.0–46.0)
Lymphs Abs: 2.5 10*3/uL (ref 0.7–4.0)
MCHC: 33 g/dL (ref 30.0–36.0)
MCV: 85.2 fl (ref 78.0–100.0)
Monocytes Absolute: 0.6 10*3/uL (ref 0.1–1.0)
Monocytes Relative: 5 % (ref 3.0–12.0)
Neutro Abs: 8.9 10*3/uL — ABNORMAL HIGH (ref 1.4–7.7)
Neutrophils Relative %: 73.1 % (ref 43.0–77.0)
Platelets: 252 10*3/uL (ref 150.0–400.0)
RBC: 5 Mil/uL (ref 3.87–5.11)
RDW: 13.9 % (ref 11.5–15.5)
WBC: 12.2 10*3/uL — ABNORMAL HIGH (ref 4.0–10.5)

## 2019-08-01 LAB — TSH: TSH: 3.64 u[IU]/mL (ref 0.35–4.50)

## 2019-08-01 LAB — VITAMIN B12: Vitamin B-12: 192 pg/mL — ABNORMAL LOW (ref 211–911)

## 2019-08-01 NOTE — Assessment & Plan Note (Signed)
Current risk is low at 4%  Lab Results  Component Value Date   CHOL 197 07/24/2019   HDL 41.90 07/24/2019   LDLCALC 117 (H) 07/24/2019   LDLDIRECT 110.0 02/27/2019   TRIG 191.0 (H) 07/24/2019   CHOLHDL 5 07/24/2019

## 2019-08-01 NOTE — Assessment & Plan Note (Signed)
I have addressed  BMI and recommended a low glycemic index diet utilizing smaller more frequent meals to increase metabolism.  I have also recommended that patient start exercising with a goal of 30 minutes of aerobic exercise a minimum of 5 days per week, but this has been hindered by her back pain . Marland Kitchen   Lab Results  Component Value Date   HGBA1C 6.0 07/24/2019   Lab Results  Component Value Date   TSH 3.64 07/31/2019   Lab Results  Component Value Date   CHOL 197 07/24/2019   HDL 41.90 07/24/2019   LDLCALC 117 (H) 07/24/2019   LDLDIRECT 110.0 02/27/2019   TRIG 191.0 (H) 07/24/2019   CHOLHDL 5 07/24/2019

## 2019-08-01 NOTE — Assessment & Plan Note (Signed)
Resolved.   Lab Results  Component Value Date   ALT 20 07/24/2019   AST 16 07/24/2019   ALKPHOS 52 07/24/2019   BILITOT 0.6 07/24/2019

## 2019-08-01 NOTE — Assessment & Plan Note (Signed)
Mometasone prescribed.

## 2019-08-01 NOTE — Assessment & Plan Note (Signed)
She has morbid obesity snoring, and daytime fatigue.  Sleep study recommended

## 2019-08-01 NOTE — Assessment & Plan Note (Signed)
Found during screening labs for fatigue.  Parenteral therapy recommended and intrinsic factor RBC folate and MMA ordered

## 2019-08-05 NOTE — Telephone Encounter (Signed)
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 Please call back

## 2019-08-07 ENCOUNTER — Other Ambulatory Visit: Payer: Self-pay | Admitting: Internal Medicine

## 2019-08-07 MED ORDER — "SYRINGE 25G X 1"" 3 ML MISC": 0 refills | Status: DC

## 2019-08-07 MED ORDER — CYANOCOBALAMIN 1000 MCG/ML IJ SOLN: 1000.0000 ug | Freq: Once | INTRAMUSCULAR | 3 refills | Status: AC

## 2019-09-03 ENCOUNTER — Other Ambulatory Visit: Payer: Self-pay | Admitting: Internal Medicine

## 2019-09-05 ENCOUNTER — Other Ambulatory Visit: Payer: No Typology Code available for payment source

## 2019-09-26 ENCOUNTER — Other Ambulatory Visit: Payer: Self-pay

## 2019-09-26 ENCOUNTER — Other Ambulatory Visit (INDEPENDENT_AMBULATORY_CARE_PROVIDER_SITE_OTHER): Payer: No Typology Code available for payment source

## 2019-09-26 DIAGNOSIS — E538 Deficiency of other specified B group vitamins: Secondary | ICD-10-CM

## 2019-09-27 ENCOUNTER — Other Ambulatory Visit: Payer: No Typology Code available for payment source

## 2019-10-02 LAB — FOLATE RBC: RBC Folate: 526 ng/mL RBC (ref >280)

## 2019-10-02 LAB — METHYLMALONIC ACID, SERUM: Methylmalonic Acid, Quant: 125 nmol/L (ref 87–318)

## 2019-10-02 LAB — INTRINSIC FACTOR ANTIBODIES: Intrinsic Factor: NEGATIVE

## 2019-10-24 ENCOUNTER — Other Ambulatory Visit: Payer: Self-pay | Admitting: Internal Medicine

## 2019-12-03 ENCOUNTER — Telehealth: Payer: Self-pay | Admitting: Internal Medicine

## 2019-12-03 NOTE — Telephone Encounter (Signed)
Lm on vm to call office to set up a virtual with a provider. Patient has sore throat and cough.

## 2019-12-04 ENCOUNTER — Telehealth (INDEPENDENT_AMBULATORY_CARE_PROVIDER_SITE_OTHER): Payer: No Typology Code available for payment source | Admitting: Nurse Practitioner

## 2019-12-04 ENCOUNTER — Telehealth: Payer: Self-pay

## 2019-12-04 ENCOUNTER — Encounter: Payer: Self-pay | Admitting: Nurse Practitioner

## 2019-12-04 VITALS — Temp 98.8°F | Ht 64.0 in | Wt 275.0 lb

## 2019-12-04 DIAGNOSIS — J069 Acute upper respiratory infection, unspecified: Secondary | ICD-10-CM | POA: Insufficient documentation

## 2019-12-04 DIAGNOSIS — J386 Stenosis of larynx: Secondary | ICD-10-CM

## 2019-12-04 DIAGNOSIS — J0100 Acute maxillary sinusitis, unspecified: Secondary | ICD-10-CM | POA: Diagnosis not present

## 2019-12-04 MED ORDER — PREDNISONE 10 MG PO TABS: ORAL_TABLET | ORAL | 0 refills | Status: DC

## 2019-12-04 MED ORDER — AMOXICILLIN 875 MG PO TABS: 875.0000 mg | ORAL_TABLET | Freq: Two times a day (BID) | ORAL | 0 refills | Status: AC

## 2019-12-04 MED ORDER — BUDESONIDE 90 MCG/ACT IN AEPB: INHALATION_SPRAY | RESPIRATORY_TRACT | 0 refills | Status: DC

## 2019-12-04 MED ORDER — SACCHAROMYCES BOULARDII 250 MG PO CAPS: 250.0000 mg | ORAL_CAPSULE | Freq: Two times a day (BID) | ORAL | 0 refills | Status: AC

## 2019-12-04 MED ORDER — BENZONATATE 200 MG PO CAPS: 200.0000 mg | ORAL_CAPSULE | Freq: Three times a day (TID) | ORAL | 0 refills | Status: AC | PRN

## 2019-12-04 MED ORDER — FLOVENT HFA 110 MCG/ACT IN AERO
1.0000 | INHALATION_SPRAY | Freq: Two times a day (BID) | RESPIRATORY_TRACT | 12 refills | Status: DC | PRN
Start: 2019-12-04 — End: 2020-03-09

## 2019-12-04 NOTE — Addendum Note (Signed)
Addended by: Amedeo Kinsman A on: 12/04/2019 03:16 PM   Modules accepted: Orders

## 2019-12-04 NOTE — Telephone Encounter (Signed)
Patient states she had a different insurance when she was on pulmicort last. She is okay with trying flovent inhaler and can be sent to [facility] pharmacy. I went ahead and scheduled her virtual follow up visit for tomorrow as well.

## 2019-12-04 NOTE — Telephone Encounter (Signed)
The patient used this in the past and asked for it. Would she want me to order Flovent? Please call her and inquire. She left work- if she wants something else called in - what pharmacy?

## 2019-12-04 NOTE — Patient Instructions (Addendum)
Please go to [facility] and get a Covid test. Please wear and mask and go home to rest. Quarantine until you hear the results. Even with a URI- you may be contagious to others.   Rest, fluids, good nutrition.  I have ordered a prednisone taper for the hx of subglottic stenosis and feeling of throat tighteness.   I have ordered a Pulmicort inhaled steroid for you to start using 1  puffs twice a day for the same reason.   Use the Flonase as as directed and you have this at home.   Simply Saline rinse for the nasal congestion.   Amoxicillin for maxillary pressure and tightness and likely sinusitis. Flor a Armed forces logistics/support/administrative officer- or other probiotic that you may have at home to help prevent diarrhea  Video-visit with me tomorrow.    If you feel that the throat tightness gets worse or your have worsening shortness of breath, proceed to the emergency room .       Upper Respiratory Infection, Adult An upper respiratory infection (URI) is a common viral infection of the nose, throat, and upper air passages that lead to the lungs. The most common type of URI is the common cold. URIs usually get better on their own, without medical treatment. What are the causes? A URI is caused by a virus. You may catch a virus by:  Breathing in droplets from an infected person's cough or sneeze.  Touching something that has been exposed to the virus (contaminated) and then touching your mouth, nose, or eyes. What increases the risk? You are more likely to get a URI if:  You are very young or very old.  It is autumn or winter.  You have close contact with others, such as at a daycare, school, or health care facility.  You smoke.  You have long-term (chronic) heart or lung disease.  You have a weakened disease-fighting (immune) system.  You have nasal allergies or asthma.  You are experiencing a lot of stress.  You work in an area that has poor air circulation.  You have poor nutrition. What are the  signs or symptoms? A URI usually involves some of the following symptoms:  Runny or stuffy (congested) nose.  Sneezing.  Cough.  Sore throat.  Headache.  Fatigue.  Fever.  Loss of appetite.  Pain in your forehead, behind your eyes, and over your cheekbones (sinus pain).  Muscle aches.  Redness or irritation of the eyes.  Pressure in the ears or face. How is this diagnosed? This condition may be diagnosed based on your medical history and symptoms, and a physical exam. Your health care provider may use a cotton swab to take a mucus sample from your nose (nasal swab). This sample can be tested to determine what virus is causing the illness. How is this treated? URIs usually get better on their own within 7-10 days. You can take steps at home to relieve your symptoms. Medicines cannot cure URIs, but your health care provider may recommend certain medicines to help relieve symptoms, such as:  Over-the-counter cold medicines.  Cough suppressants. Coughing is a type of defense against infection that helps to clear the respiratory system, so take these medicines only as recommended by your health care provider.  Fever-reducing medicines. Follow these instructions at home: Activity  Rest as needed.  If you have a fever, stay home from work or school until your fever is gone or until your health care provider says you are no longer contagious. Your  health care provider may have you wear a face mask to prevent your infection from spreading. Relieving symptoms  Gargle with a salt-water mixture 3-4 times a day or as needed. To make a salt-water mixture, completely dissolve -1 tsp of salt in 1 cup of warm water.  Use a cool-mist humidifier to add moisture to the air. This can help you breathe more easily. Eating and drinking   Drink enough fluid to keep your urine pale yellow.  Eat soups and other clear broths. General instructions   Take over-the-counter and prescription  medicines only as told by your health care provider. These include cold medicines, fever reducers, and cough suppressants.  Do not use any products that contain nicotine or tobacco, such as cigarettes and e-cigarettes. If you need help quitting, ask your health care provider.  Stay away from secondhand smoke.  Stay up to date on all immunizations, including the yearly (annual) flu vaccine.  Keep all follow-up visits as told by your health care provider. This is important. How to prevent the spread of infection to others   URIs can be passed from person to person (are contagious). To prevent the infection from spreading: ? Wash your hands often with soap and water. If soap and water are not available, use hand sanitizer. ? Avoid touching your mouth, face, eyes, or nose. ? Cough or sneeze into a tissue or your sleeve or elbow instead of into your hand or into the air. Contact a health care provider if:  You are getting worse instead of better.  You have a fever or chills.  Your mucus is brown or red.  You have yellow or brown discharge coming from your nose.  You have pain in your face, especially when you bend forward.  You have swollen neck glands.  You have pain while swallowing.  You have white areas in the back of your throat. Get help right away if:  You have shortness of breath that gets worse.  You have severe or persistent: ? Headache. ? Ear pain. ? Sinus pain. ? Chest pain.  You have chronic lung disease along with any of the following: ? Wheezing. ? Prolonged cough. ? Coughing up blood. ? A change in your usual mucus.  You have a stiff neck.  You have changes in your: ? Vision. ? Hearing. ? Thinking. ? Mood. Summary  An upper respiratory infection (URI) is a common infection of the nose, throat, and upper air passages that lead to the lungs.  A URI is caused by a virus.  URIs usually get better on their own within 7-10 days.  Medicines cannot  cure URIs, but your health care provider may recommend certain medicines to help relieve symptoms. This information is not intended to replace advice given to you by your health care provider. Make sure you discuss any questions you have with your health care provider. Document Revised: 05/10/2018 Document Reviewed: 12/16/2016 Elsevier Patient Education  2020 [employer]  Sinusitis, Adult Sinusitis is inflammation of your sinuses. Sinuses are hollow spaces in the bones around your face. Your sinuses are located:  Around your eyes.  In the middle of your forehead.  Behind your nose.  In your cheekbones. Mucus normally drains out of your sinuses. When your nasal tissues become inflamed or swollen, mucus can become trapped or blocked. This allows bacteria, viruses, and fungi to grow, which leads to infection. Most infections of the sinuses are caused by a virus. Sinusitis can develop quickly. It can last  for up to 4 weeks (acute) or for more than 12 weeks (chronic). Sinusitis often develops after a cold. What are the causes? This condition is caused by anything that creates swelling in the sinuses or stops mucus from draining. This includes:  Allergies.  Asthma.  Infection from bacteria or viruses.  Deformities or blockages in your nose or sinuses.  Abnormal growths in the nose (nasal polyps).  Pollutants, such as chemicals or irritants in the air.  Infection from fungi (rare). What increases the risk? You are more likely to develop this condition if you:  Have a weak body defense system (immune system).  Do a lot of swimming or diving.  Overuse nasal sprays.  Smoke. What are the signs or symptoms? The main symptoms of this condition are pain and a feeling of pressure around the affected sinuses. Other symptoms include:  Stuffy nose or congestion.  Thick drainage from your nose.  Swelling and warmth over the affected sinuses.  Headache.  Upper toothache.  A  cough that may get worse at night.  Extra mucus that collects in the throat or the back of the nose (postnasal drip).  Decreased sense of smell and taste.  Fatigue.  A fever.  Sore throat.  Bad breath. How is this diagnosed? This condition is diagnosed based on:  Your symptoms.  Your medical history.  A physical exam.  Tests to find out if your condition is acute or chronic. This may include: ? Checking your nose for nasal polyps. ? Viewing your sinuses using a device that has a light (endoscope). ? Testing for allergies or bacteria. ? Imaging tests, such as an MRI or CT scan. In rare cases, a bone biopsy may be done to rule out more serious types of fungal sinus disease. How is this treated? Treatment for sinusitis depends on the cause and whether your condition is chronic or acute.  If caused by a virus, your symptoms should go away on their own within 10 days. You may be given medicines to relieve symptoms. They include: ? Medicines that shrink swollen nasal passages (topical intranasal decongestants). ? Medicines that treat allergies (antihistamines). ? A spray that eases inflammation of the nostrils (topical intranasal corticosteroids). ? Rinses that help get rid of thick mucus in your nose (nasal saline washes).  If caused by bacteria, your health care provider may recommend waiting to see if your symptoms improve. Most bacterial infections will get better without antibiotic medicine. You may be given antibiotics if you have: ? A severe infection. ? A weak immune system.  If caused by narrow nasal passages or nasal polyps, you may need to have surgery. Follow these instructions at home: Medicines  Take, use, or apply over-the-counter and prescription medicines only as told by your health care provider. These may include nasal sprays.  If you were prescribed an antibiotic medicine, take it as told by your health care provider. Do not stop taking the antibiotic even  if you start to feel better. Hydrate and humidify   Drink enough fluid to keep your urine pale yellow. Staying hydrated will help to thin your mucus.  Use a cool mist humidifier to keep the humidity level in your home above 50%.  Inhale steam for 10-15 minutes, 3-4 times a day, or as told by your health care provider. You can do this in the bathroom while a hot shower is running.  Limit your exposure to cool or dry air. Rest  Rest as much as possible.  Sleep  with your head raised (elevated).  Make sure you get enough sleep each night. General instructions   Apply a warm, moist washcloth to your face 3-4 times a day or as told by your health care provider. This will help with discomfort.  Wash your hands often with soap and water to reduce your exposure to germs. If soap and water are not available, use hand sanitizer.  Do not smoke. Avoid being around people who are smoking (secondhand smoke).  Keep all follow-up visits as told by your health care provider. This is important. Contact a health care provider if:  You have a fever.  Your symptoms get worse.  Your symptoms do not improve within 10 days. Get help right away if:  You have a severe headache.  You have persistent vomiting.  You have severe pain or swelling around your face or eyes.  You have vision problems.  You develop confusion.  Your neck is stiff.  You have trouble breathing. Summary  Sinusitis is soreness and inflammation of your sinuses. Sinuses are hollow spaces in the bones around your face.  This condition is caused by nasal tissues that become inflamed or swollen. The swelling traps or blocks the flow of mucus. This allows bacteria, viruses, and fungi to grow, which leads to infection.  If you were prescribed an antibiotic medicine, take it as told by your health care provider. Do not stop taking the antibiotic even if you start to feel better.  Keep all follow-up visits as told by your  health care provider. This is important. This information is not intended to replace advice given to you by your health care provider. Make sure you discuss any questions you have with your health care provider. Document Revised: 10/02/2017 Document Reviewed: 10/02/2017 Elsevier Patient Education  2020 [employer]

## 2019-12-04 NOTE — Telephone Encounter (Signed)
Pulmicort 90 mcg flexhaler was sent in for patient today. Insurance will only cover if patient has tried and failed arnuity ellipta and flovent HFA in the past year. There is no history of patient using either medications before.

## 2019-12-04 NOTE — Progress Notes (Signed)
Virtual Visit via Video Note  I connected with@ on 12/04/19 at  8:00 AM EDT by a video enabled telemedicine application and verified that I am speaking with the correct person using two identifiers. This visit type was conducted due to national recommendations for restrictions regarding the COVID-19 Pandemic (e.g. social distancing).  This format is felt to be most appropriate for this patient at this time.  Location patient: work Location provider: work  Persons participating in the virtual visit: patient, provider    I discussed the limitations, risks, security and privacy concerns of performing an evaluation and management service by telephone and the availability of in person appointments. I also discussed with the patient that there may be a patient responsible charge related to this service. The patient expressed understanding and agreed to proceed.    History of Present Illness: This is a 44 year old patient reports a sore throat onset last Wednesday associated with runny nose, nasal fullness and congestion, bilateral ear pain.  Cough has been mild and not bothersome at night.  She is getting more maxillary pressure and fullness over the weekend and is blowing out and coughing up colored sputum. She is experiencing progressive discomfort in her upper airway and feels that she is getting tightness back in the subglottic stenosis region. She had subglottic stenosis and trachea dilated 8-10 years ago and whenever gets a cold it attacks this area.  She is requesting prednisone taper because she has had to use this in the past whenever she starts coughing. She feels  SOB with activity.  No wheezing, chest tightness or SOB at rest.  She has had no fevers or chills.  She has been treated with Tylenol.  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 She was told her lungs were clear but the respiratory therapist felt she was close to stridor.  She  did recommend beginning a Pulmicort inhaled steroid as well as Flonase nasal spray.  The patient is eating and drinking without difficulty.  She feels well enough that she could work today at 75% capacity.  She has had both of her Covid vaccines by January.   Past Medical History:  Diagnosis Date  . Bronchitis    hx of  . Complication of anesthesia   . Dysrhythmia    "does not see cardiologist"  . Family history of anesthesia complication    "morphine night terrors"  . GERD (gastroesophageal reflux disease)   . Headache(784.0)    "migraines"  . Hypertension   . PONV (postoperative nausea and vomiting)   . Shortness of breath    "wheezing"     Social History   Socioeconomic History  . Marital status: Married    Spouse name: Not on file  . Number of children: Not on file  . Years of education: Not on file  . Highest education level: Not on file  Occupational History  . Not on file  Tobacco Use  . Smoking status: Never Smoker  . Smokeless tobacco: Never Used  Vaping Use  . Vaping Use: Never used  Substance and Sexual Activity  . Alcohol use: Yes    Comment: "social"  . Drug use: No  . Sexual activity: Yes    Birth control/protection: Implant  Other Topics Concern  . Not on file  Social History Narrative  . Not on file   Social Determinants of Health   Financial Resource Strain:   . Difficulty of Paying Living  Expenses:   Food Insecurity:   . Worried About Programme researcher, broadcasting/film/video in the Last Year:   . Barista in the Last Year:   Transportation Needs:   . Freight forwarder (Medical):   Marland Kitchen Lack of Transportation (Non-Medical):   Physical Activity:   . Days of Exercise per Week:   . Minutes of Exercise per Session:   Stress:   . Feeling of Stress :   Social Connections:   . Frequency of Communication with Friends and Family:   . Frequency of Social Gatherings with Friends and Family:   . Attends Religious Services:   . Active Member of Clubs or  Organizations:   . Attends Banker Meetings:   Marland Kitchen Marital Status:   Intimate Partner Violence:   . Fear of Current or Ex-Partner:   . Emotionally Abused:   Marland Kitchen Physically Abused:   . Sexually Abused:     Past Surgical History:  Procedure Laterality Date  . CESAREAN SECTION     x 2  . MICROLARYNGOSCOPY WITH LASER AND BALLOON DILATION N/A 07/20/2012   Procedure: MICROLARYNGOSCOPY WITH LASER AND BALLOON DILATION;  Surgeon: Christia Reading, MD;  Location: [facility] OR;  Service: ENT;  Laterality: N/A;  WITH JET VENTURI VENTILATION  . TONGUE FLAP RELEASE    . widom extractions      Family History  Problem Relation Age of Onset  . Hypertension Mother   . Hyperlipidemia Mother   . Arthritis Mother   . Hypertension Father   . Hyperlipidemia Father   . Breast cancer Neg Hx     Allergies  Allergen Reactions  . Latex Hives    Rash and hives    Current Outpatient Medications on File Prior to Visit  Medication Sig Dispense Refill  . cetirizine (ZYRTEC) 10 MG tablet Take 10 mg by mouth daily.    . cholecalciferol (VITAMIN D3) 25 MCG (1000 UNIT) tablet Take 1,000 Units by mouth daily.    . cyclobenzaprine (FLEXERIL) 5 MG tablet Take 5 mg by mouth 3 (three) times daily as needed.    Marland Kitchen DENAVIR 1 % cream APPLY TOPICALLY EVERY TWO HOURS FOR 4 DAYS AS NEEDED FOR COLD SORES 5 g 0  . etonogestrel (NEXPLANON) 68 MG IMPL implant 1 each by Subdermal route once.    . fluticasone (FLONASE) 50 MCG/ACT nasal spray Place 2 sprays into both nostrils daily. 16 g 2  . gabapentin (NEURONTIN) 100 MG capsule Take 2 capsules (200 mg total) by mouth at bedtime. 180 capsule 3  . Misc Natural Products (TART CHERRY ADVANCED PO) Take 1 capsule by mouth daily.    . mometasone (ELOCON) 0.1 % cream Apply 1 application topically daily. 45 g 0  . omeprazole (PRILOSEC) 40 MG capsule TAKE 1 CAPSULE BY MOUTH DAILY. 90 capsule 1  . tiZANidine (ZANAFLEX) 4 MG capsule TAKE 1 CAPSULE BY MOUTH THREE TIMES DAILY 90 capsule 0   . Turmeric 400 MG CAPS Take 1 capsule by mouth.     No current facility-administered medications on file prior to visit.    Temp 98.8 F (37.1 C) (Oral)   Ht 5\' 4"  (1.626 m)   Wt 275 lb (124.7 kg)   BMI 47.20 kg/m    Observations/Objective: VITALS per patient if applicable: 98.8, weight 275, did not check blood pressure pulse ox  GENERAL: alert, oriented, appears fatigued and mildly ill. No acute distress  HEENT: atraumatic, conjunctiva clear, no obvious abnormalities on inspection of external nose  and ears.  Obvious nasal congestion tone to the voice, slight hoarseness,   NECK: normal movements of the head and neck  LUNGS: No signs of respiratory distress, breathing rate appears normal, no obvious gross SOB, gasping. 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 Please wear and mask and go home to rest. Quarantine until you hear the results. Even with a URI- you may be contagious to others.   I have ordered a prednisone taper for the hx of subglottic stenosis and feeling of throat tightness.   I have ordered a Pulmicort inhaled steroid for you to start using 1  puffs twice a day for the same reason.   Use the Flonase as directed and you have this at home.   Simply Saline rinse for the nasal congestion.   Amoxicillin for maxillary pressure and tightness and likely sinusitis. Flor a Armed forces logistics/support/administrative officer- or other probiotic that you may have at home to help prevent diarrhea  Video-visit with me tomorrow.    If you feel that the throat tightness gets worse or  your have worsening shortness of breath, proceed to the emergency room.   Follow Up Instructions: I discussed the assessment and treatment plan with the patient. The patient was provided an opportunity to ask questions and all were answered. The patient agreed with the plan and demonstrated an understanding of the instructions.   The patient was advised to call back or seek an in-person evaluation if the symptoms worsen or if the condition fails to improve as anticipated.    Lisa Kinsman, NP

## 2019-12-05 ENCOUNTER — Telehealth (INDEPENDENT_AMBULATORY_CARE_PROVIDER_SITE_OTHER): Payer: No Typology Code available for payment source | Admitting: Nurse Practitioner

## 2019-12-05 ENCOUNTER — Encounter: Payer: Self-pay | Admitting: Nurse Practitioner

## 2019-12-05 ENCOUNTER — Other Ambulatory Visit: Payer: Self-pay

## 2019-12-05 VITALS — Ht 64.0 in | Wt 275.0 lb

## 2019-12-05 DIAGNOSIS — J0100 Acute maxillary sinusitis, unspecified: Secondary | ICD-10-CM | POA: Diagnosis not present

## 2019-12-05 DIAGNOSIS — J069 Acute upper respiratory infection, unspecified: Secondary | ICD-10-CM | POA: Diagnosis not present

## 2019-12-05 DIAGNOSIS — J386 Stenosis of larynx: Secondary | ICD-10-CM | POA: Diagnosis not present

## 2019-12-05 NOTE — Progress Notes (Signed)
Virtual Visit via virtual Note  This visit type was conducted due to national recommendations for restrictions regarding the COVID-19 pandemic (e.g. social distancing).  This format is felt to be most appropriate for this patient at this time.  All issues noted in this document were discussed and addressed.  No physical exam was performed (except for noted visual exam findings with Video Visits).   I connected with@ on 12/05/19 at  1:30 PM EDT by a video enabled telemedicine application or telephone and verified that I am speaking with the correct person using two identifiers. Location patient: home Location provider: work or home office Persons participating in the virtual visit: patient, provider  I discussed the limitations, risks, security and privacy concerns of performing an evaluation and management service by telephone and the availability of in person appointments. I also discussed with the patient that there may be a patient responsible charge related to this service. The patient expressed understanding and agreed to proceed.  Reason for visit: F/up from yesterday  HPI: She is feeling much better today. Did not sleep much last night- coughed more after 3 pm .  Tessalon Perles did help.  She needs to have more cough medicine for bedtime.  Tolerating the prednisone well.  She is definitely feeling less tightness in her epiglottis area today.  She has less hoarseness today.  She is no longer hearing upper airway wheezing.    She brought up a lot of sputum yesterday that she coughed out and seems to be bringing up less sputum today.  She has no tightness or wheezing in the chest.  Feeling that she is definitely getting better.  She has noted no fevers, still has fatigue.  Eating and drinking well.  She is at home resting.  She has not picked up the Flovent or the Florastor yet.  She is going to look into other  probiotics.  She used the Flonase about 3 times and that  seems to be working well.   She has not needed to use saline nasal spray.  ROS: See pertinent positives and negatives per HPI.  Past Medical History:  Diagnosis Date  . Bronchitis    hx of  . Complication of anesthesia   . Dysrhythmia    "does not see cardiologist"  . Family history of anesthesia complication    "morphine night terrors"  . GERD (gastroesophageal reflux disease)   . Headache(784.0)    "migraines"  . Hypertension   . PONV (postoperative nausea and vomiting)   . Shortness of breath    "wheezing"    Past Surgical History:  Procedure Laterality Date  . CESAREAN SECTION     x 2  . MICROLARYNGOSCOPY WITH LASER AND BALLOON DILATION N/A 07/20/2012   Procedure: MICROLARYNGOSCOPY WITH LASER AND BALLOON DILATION;  Surgeon: Christia Reading, MD;  Location: [facility] OR;  Service: ENT;  Laterality: N/A;  WITH JET VENTURI VENTILATION  . TONGUE FLAP RELEASE    . widom extractions      Family History  Problem Relation Age of Onset  . Hypertension Mother   . Hyperlipidemia Mother   . Arthritis Mother   . Hypertension Father   . Hyperlipidemia Father   . Breast cancer Neg Hx      Current Outpatient Medications:  .  amoxicillin (AMOXIL) 875 MG tablet, Take 1 tablet (875 mg total) by mouth 2 (two) times daily for 7 days., Disp: 14 tablet, Rfl: 0 .  benzonatate (TESSALON) 200 MG capsule, Take 1 capsule (  200 mg total) by mouth 3 (three) times daily as needed for up to 7 days for cough., Disp: 21 capsule, Rfl: 0 .  cetirizine (ZYRTEC) 10 MG tablet, Take 10 mg by mouth daily., Disp: , Rfl:  .  cholecalciferol (VITAMIN D3) 25 MCG (1000 UNIT) tablet, Take 1,000 Units by mouth daily., Disp: , Rfl:  .  cyclobenzaprine (FLEXERIL) 5 MG tablet, Take 5 mg by mouth 3 (three) times daily as needed., Disp: , Rfl:  .  DENAVIR 1 % cream, APPLY TOPICALLY EVERY TWO HOURS FOR 4 DAYS AS NEEDED FOR COLD SORES, Disp: 5 g, Rfl: 0 .  etonogestrel (NEXPLANON) 68 MG IMPL implant, 1 each by Subdermal route once., Disp: , Rfl:  .   fluticasone (FLONASE) 50 MCG/ACT nasal spray, Place 2 sprays into both nostrils daily., Disp: 16 g, Rfl: 2 .  fluticasone (FLOVENT HFA) 110 MCG/ACT inhaler, Inhale 1 puff into the lungs 2 (two) times daily as needed., Disp: 1 Inhaler, Rfl: 12 .  gabapentin (NEURONTIN) 100 MG capsule, Take 2 capsules (200 mg total) by mouth at bedtime., Disp: 180 capsule, Rfl: 3 .  guaiFENesin-codeine 100-10 MG/5ML syrup, Take 5-10 mLs by mouth every 4 (four) hours as needed., Disp: , Rfl:  .  Misc Natural Products (TART CHERRY ADVANCED PO), Take 1 capsule by mouth daily., Disp: , Rfl:  .  mometasone (ELOCON) 0.1 % cream, Apply 1 application topically daily., Disp: 45 g, Rfl: 0 .  omeprazole (PRILOSEC) 40 MG capsule, TAKE 1 CAPSULE BY MOUTH DAILY., Disp: 90 capsule, Rfl: 1 .  predniSONE (DELTASONE) 10 MG tablet, Take 6 tablets by mouth today and decrease by 1 tablet daily until off. 6-5-4-3-2-1 off taper, Disp: 21 tablet, Rfl: 0 .  saccharomyces boulardii (FLORASTOR) 250 MG capsule, Take 1 capsule (250 mg total) by mouth 2 (two) times daily for 7 days., Disp: 14 capsule, Rfl: 0 .  tiZANidine (ZANAFLEX) 4 MG capsule, TAKE 1 CAPSULE BY MOUTH THREE TIMES DAILY, Disp: 90 capsule, Rfl: 0 .  Turmeric 400 MG CAPS, Take 1 capsule by mouth., Disp: , Rfl:   EXAM:  VITALS per patient if applicable:  GENERAL: alert, oriented, appears fatigued  and in no acute distress  HEENT: atraumatic, conjunctiva clear, no obvious abnormalities on inspection of external nose and ears, no hoarseness noted today.   NECK: normal movements of the head and neck  LUNGS: on inspection no signs of respiratory distress, breathing rate appears normal, no obvious gross SOB, gasping or wheezing  CV: no obvious cyanosis  MS: moves all visible extremities without noticeable abnormality  PSYCH/NEURO: pleasant and cooperative, no obvious depression or anxiety, speech and thought processing grossly intact  ASSESSMENT AND PLAN:  Discussed the  following assessment and plan:  Acute URI  Acute non-recurrent maxillary sinusitis  Idiopathic subglottic tracheal stenosis  No problem-specific Assessment & Plan notes found for this encounter.  You seem to be better with the current therapy.  You can take your Tessalon Perles 1 in the morning, 1 in the afternoon and 1 in the evening.  Add the Mucinex DM at bedtime.  Please pick up your Flovent inhaler and start using that today so it is helping as you wean down off the oral prednisone.  You can try yogurt such as Activia with additional Culturelle, or Align. Florastor is sold over [employer], product/process development at [employer]. You can take the probiotics for about 3-4 days after you complete the Amoxil. Rest, hydrate, eat nutritious protein rich foods, monitor for  fever.   Please call back for another appointment if you do not have full resolution of the symptoms.  If your symptoms get worse at any time, please seek in-person care at Acute Care.    I discussed the assessment and treatment plan with the patient. The patient was provided an opportunity to ask questions and all were answered. The patient agreed with the plan and demonstrated an understanding of the instructions.   I provided 10  minutes of non-face-to-face time during this encounter.  Amedeo Kinsman, NP Adult Nurse Practitioner [facility] [employer] [phone] below

## 2019-12-05 NOTE — Patient Instructions (Signed)
You seem to be better with the current therapy.  You can take your Tessalon Perles 1 in the morning, 1 in the afternoon and 1 in the evening.  Add the Mucinex DM at bedtime.  Please pick up your Flovent inhaler and start using that today so it is helping as you wean down off the oral prednisone.  You can try yogurt such as Activia with additional Culturelle, or Align. Florastor is sold over [employer], product/process development at [employer]. You can take the probiotics for about 3-4 days after you complete the Amoxil. Rest, hydrate, eat nutritious protein rich foods, monitor for fever.   Please call back for another appointment if you do not have full resolution of the symptoms.  If your symptoms get worse at any time, please seek in-person care at Acute Care.

## 2019-12-06 ENCOUNTER — Telehealth: Payer: Self-pay | Admitting: Internal Medicine

## 2019-12-06 NOTE — Telephone Encounter (Signed)
Can you triage this patient

## 2019-12-06 NOTE — Telephone Encounter (Signed)
Pt called in still having a bad cough and breathing issues

## 2019-12-06 NOTE — Telephone Encounter (Signed)
She needs in -person care and exam. If having SOB- she should be seen in the ED.Please call her and if having stridor or trouble breathing- needs to call EMS.

## 2019-12-06 NOTE — Telephone Encounter (Signed)
Spoken to patient. She feels very ran down and the medication worked great yesterday. She is having a sore throat from all the coughing but she has just taken her medication at 11. Sx are a little better. She feels very dehydrated. She stated she will drink more fluids since medication she is currently taking required her to drink more fluids to be effective. Patient does not want appointment or go to ED/UC. She stated if sx worsen she will go. She is going to give the medications alittle bit of time to work.

## 2019-12-09 NOTE — Telephone Encounter (Signed)
She needs an in person evaluation in ER or  Acute Cute care today. She can go to employee health for an exam.

## 2019-12-09 NOTE — Telephone Encounter (Signed)
Left detailed message informing the patient what was stated.

## 2019-12-09 NOTE — Telephone Encounter (Signed)
Patient is having SOB on exertion a little worse. patient went to work and she is having upper lung wheezing. She is now able to cough the phlegm out, the color is a yellow greenish color. Patient does overall feel better but breathing SX are worse.

## 2019-12-13 ENCOUNTER — Ambulatory Visit (INDEPENDENT_AMBULATORY_CARE_PROVIDER_SITE_OTHER): Payer: No Typology Code available for payment source

## 2019-12-13 ENCOUNTER — Ambulatory Visit
Admission: EM | Admit: 2019-12-13 | Discharge: 2019-12-13 | Disposition: A | Discharge status: Home / Self Care | Payer: No Typology Code available for payment source | Attending: Internal Medicine | Admitting: Internal Medicine

## 2019-12-13 ENCOUNTER — Ambulatory Visit: Payer: Self-pay

## 2019-12-13 ENCOUNTER — Other Ambulatory Visit: Payer: Self-pay | Admitting: Internal Medicine

## 2019-12-13 ENCOUNTER — Other Ambulatory Visit: Payer: Self-pay

## 2019-12-13 DIAGNOSIS — J209 Acute bronchitis, unspecified: Secondary | ICD-10-CM

## 2019-12-13 MED ORDER — PREDNISONE 10 MG (21) PO TBPK
ORAL_TABLET | Freq: Every day | ORAL | 0 refills | Status: DC
Start: 2019-12-13 — End: 2020-01-30

## 2019-12-13 NOTE — ED Triage Notes (Addendum)
Patient states that she started having a sore throat x 3 weeks ago. Patient states that she has been having a cough with stridor since last week. Reports that she was tested for covid 10 days ago and it was negative. Patient states that she has been short of breath and worsens with activity. States that cough has been constant. States that she just finished a Zpack and has been taking mucinex, albuterol inhaler and finished a course of prednisone.

## 2019-12-13 NOTE — ED Provider Notes (Addendum)
[facility]    CSN: [id number] Arrival date & time: 12/13/19  1057      History   Chief Complaint Chief Complaint  Patient presents with  . Cough    HPI Lisa [name] is a 44 y.o. female fully vaccinated against COVID-19 virus comes to the urgent care with a 3-week history of cough, shortness of breath, stridor and chest tightness. Patient symptoms started about 3 days ago with a sore throat. At that time patient tested negative for COVID-19. She was seen by her primary care physician and has completed a course of amoxicillin and subsequently azithromycin as well as a course of steroids. She is currently on albuterol inhaler as well as a steroid inhaler. According to the patient the cough seems to be improving and she has had sputum production which is clear. She denies any fever or chills. She admits to having some wheezing which is apparently worsening. She had partial improvement with steroid use.Marland Kitchen   HPI  Past Medical History:  Diagnosis Date  . Bronchitis    hx of  . Complication of anesthesia   . Dysrhythmia    "does not see cardiologist"  . Family history of anesthesia complication    "morphine night terrors"  . GERD (gastroesophageal reflux disease)   . Headache(784.0)    "migraines"  . Hypertension   . PONV (postoperative nausea and vomiting)   . Shortness of breath    "wheezing"    Patient Active Problem List   Diagnosis Date Noted  . Acute URI 12/04/2019  . Acute maxillary sinusitis 12/04/2019  . Loud snoring 08/01/2019  . B12 deficiency 08/01/2019  . Eczema of left external ear 08/01/2019  . Hyperlipidemia associated with type 2 diabetes mellitus (HCC) 07/31/2019  . Nonallopathic lesion of cervical region 04/29/2019  . Nonallopathic lesion of thoracic region 04/29/2019  . Nonallopathic lesion of rib cage 04/29/2019  . Cervical radiculopathy at C7 03/26/2019  . Abnormal breast exam 01/24/2019  . Elevated ALT measurement 01/24/2019  .  Allergic rhinitis due to pollen 08/18/2018  . Abnormal mammogram of right breast 02/27/2018  . Dermatitis 05/02/2017  . Type 2 diabetes mellitus without complications (HCC) 05/02/2017  . Idiopathic subglottic tracheal stenosis 06/21/2016  . Encounter for general adult medical examination with abnormal findings 04/29/2016  . Low back pain without sciatica 04/28/2015  . Class 3 severe obesity without serious comorbidity with body mass index (BMI) of 40.0 to 44.9 in adult (HCC) 04/28/2015  . Pre-syncope 07/14/2014    Past Surgical History:  Procedure Laterality Date  . CESAREAN SECTION     x 2  . MICROLARYNGOSCOPY WITH LASER AND BALLOON DILATION N/A 07/20/2012   Procedure: MICROLARYNGOSCOPY WITH LASER AND BALLOON DILATION;  Surgeon: Christia Reading, MD;  Location: [facility] OR;  Service: ENT;  Laterality: N/A;  WITH JET VENTURI VENTILATION  . TONGUE FLAP RELEASE    . widom extractions      OB History   No obstetric history on file.      Home Medications    Prior to Admission medications   Medication Sig Start Date End Date Taking? Authorizing Provider  albuterol (VENTOLIN HFA) 108 (90 Base) MCG/ACT inhaler Inhale into the lungs. 12/09/19  Yes [provider]  cetirizine (ZYRTEC) 10 MG tablet Take 10 mg by mouth daily.   Yes [provider]  cholecalciferol (VITAMIN D3) 25 MCG (1000 UNIT) tablet Take 1,000 Units by mouth daily.   Yes [provider]  cyclobenzaprine (FLEXERIL) 5 MG  tablet Take 5 mg by mouth 3 (three) times daily as needed. 06/27/19  Yes [provider]  DENAVIR 1 % cream APPLY TOPICALLY EVERY TWO HOURS FOR 4 DAYS AS NEEDED FOR COLD SORES 03/27/18  Yes Sherlene Shamsullo, Teresa L, MD  etonogestrel (NEXPLANON) 68 MG IMPL implant 1 each by Subdermal route once.   Yes [provider]  fluticasone (FLONASE) 50 MCG/ACT nasal spray Place 2 sprays into both nostrils daily. 07/31/19  Yes Sherlene Shamsullo, Teresa L, MD  fluticasone (FLOVENT HFA) 110 MCG/ACT inhaler  Inhale 1 puff into the lungs 2 (two) times daily as needed. 12/04/19  Yes Theadore NanMills, Kimberly A, NP  gabapentin (NEURONTIN) 100 MG capsule Take 2 capsules (200 mg total) by mouth at bedtime. 03/26/19  Yes Judi SaaSmith, Zachary M, DO  Misc Natural Products (TART CHERRY ADVANCED PO) Take 1 capsule by mouth daily.   Yes [provider]  mometasone (ELOCON) 0.1 % cream Apply 1 application topically daily. 07/31/19  Yes Sherlene Shamsullo, Teresa L, MD  omeprazole (PRILOSEC) 40 MG capsule TAKE 1 CAPSULE BY MOUTH DAILY. 10/24/19  Yes Sherlene Shamsullo, Teresa L, MD  tiZANidine (ZANAFLEX) 4 MG capsule TAKE 1 CAPSULE BY MOUTH THREE TIMES DAILY 09/03/19  Yes Sherlene Shamsullo, Teresa L, MD  Turmeric 400 MG CAPS Take 1 capsule by mouth.   Yes [provider]  predniSONE (STERAPRED UNI-PAK 21 TAB) 10 MG (21) TBPK tablet Take by mouth daily. Take 6 tabs by mouth daily  for 2 days, then 5 tabs for 2 days, then 4 tabs for 2 days, then 3 tabs for 2 days, 2 tabs for 2 days, then 1 tab by mouth daily for 2 days 12/13/19   Merrilee JanskyLamptey, Itzae Miralles O, MD    Family History Family History  Problem Relation Age of Onset  . Hypertension Mother   . Hyperlipidemia Mother   . Arthritis Mother   . Hypertension Father   . Hyperlipidemia Father   . Breast cancer Neg Hx     Social History Social History   Tobacco Use  . Smoking status: Never Smoker  . Smokeless tobacco: Never Used  Vaping Use  . Vaping Use: Never used  Substance Use Topics  . Alcohol use: Yes    Comment: "social"  . Drug use: No     Allergies   Latex   Review of Systems Review of Systems  Constitutional: Positive for fatigue. Negative for chills and fever.  HENT: Negative for congestion, ear discharge and ear pain.   Eyes: Negative.   Respiratory: Positive for cough, chest tightness, shortness of breath and wheezing.   Cardiovascular: Negative for chest pain and palpitations.  Gastrointestinal: Negative.   Genitourinary: Negative.   Musculoskeletal: Negative.   Skin:  Negative.      Physical Exam Triage Vital Signs ED Triage Vitals  Enc Vitals Group     BP 12/13/19 1115 (!) 155/99     Pulse Rate 12/13/19 1115 91     Resp 12/13/19 1115 22     Temp 12/13/19 1115 98.5 F (36.9 C)     Temp Source 12/13/19 1115 Oral     SpO2 12/13/19 1115 100 %     Weight 12/13/19 1112 (!) 275 lb (124.7 kg)     Height 12/13/19 1112 5\' 3"  (1.6 m)     Head Circumference --      Peak Flow --      Pain Score 12/13/19 1112 2     Pain Loc --      Pain Edu? --  Excl. in GC? --    No data found.  Updated Vital Signs BP (!) 155/99 (BP Location: Left Arm)   Pulse 91   Temp 98.5 F (36.9 C) (Oral)   Resp 22   Ht 5\' 3"  (1.6 m)   Wt (!) 124.7 kg   SpO2 100%   BMI 48.71 kg/m   Visual Acuity Right Eye Distance:   Left Eye Distance:   Bilateral Distance:    Right Eye Near:   Left Eye Near:    Bilateral Near:     Physical Exam Vitals and nursing note reviewed.  Constitutional:      General: She is not in acute distress.    Appearance: She is not ill-appearing.  HENT:     Right Ear: Tympanic membrane normal.     Left Ear: Tympanic membrane normal.     Mouth/Throat:     Mouth: Mucous membranes are moist.     Pharynx: No oropharyngeal exudate or posterior oropharyngeal erythema.  Eyes:     General:        Right eye: No discharge.        Left eye: No discharge.     Extraocular Movements: Extraocular movements intact.     Pupils: Pupils are equal, round, and reactive to light.  Cardiovascular:     Rate and Rhythm: Normal rate and regular rhythm.     Pulses: Normal pulses.     Heart sounds: Normal heart sounds.  Pulmonary:     Breath sounds: No stridor. Wheezing present. No rhonchi or rales.  Chest:     Chest wall: No tenderness.  Abdominal:     General: Bowel sounds are normal.     Palpations: Abdomen is soft.     Tenderness: There is no abdominal tenderness. There is no guarding or rebound.  Skin:    General: Skin is warm.  Neurological:      Mental Status: She is alert.      UC Treatments / Results  Labs (all labs ordered are listed, but only abnormal results are displayed) Labs Reviewed - No data to display  EKG   Radiology DG Chest 2 View  Result Date: 12/13/2019 CLINICAL DATA:  Cough and shortness of breath. EXAM: CHEST - 2 VIEW COMPARISON:  July 20, 2012 FINDINGS: Lungs are clear. Heart size and pulmonary vascularity are normal. No adenopathy. No bone lesions. IMPRESSION: Lungs clear.  Cardiac silhouette normal. Electronically Signed   By: July 22, 2012 III M.D.   On: 12/13/2019 11:46    Procedures Procedures (including critical care time)  Medications Ordered in UC Medications - No data to display  Initial Impression / Assessment and Plan / UC Course  I have reviewed the triage vital signs and the nursing notes.  Pertinent labs & imaging results that were available during my care of the patient were reviewed by me and considered in my medical decision making (see chart for details).     1. Acute bronchitis with bronchospasm 2. Post viral cough Patient is reassured that cough will improve over time Chest x-ray is negative for acute lung infiltrate I will start patient on a tapering dose of prednisone Patient should continue using albuterol inhaler as well as inhaled steroids Patient has tested negative for COVID-19 and she is less likely to have COVID-19 Return precautions given  Final Clinical Impressions(s) / UC Diagnoses   Final diagnoses:  Acute bronchitis with bronchospasm   Discharge Instructions   None    ED Prescriptions  Medication Sig Dispense Auth. Provider   predniSONE (STERAPRED UNI-PAK 21 TAB) 10 MG (21) TBPK tablet Take by mouth daily. Take 6 tabs by mouth daily  for 2 days, then 5 tabs for 2 days, then 4 tabs for 2 days, then 3 tabs for 2 days, 2 tabs for 2 days, then 1 tab by mouth daily for 2 days 42 tablet Rogan Ecklund, Britta Mccreedy, MD     PDMP not reviewed this encounter.     Merrilee Jansky, MD 12/13/19 1239    Merrilee Jansky, MD 12/13/19 1240

## 2019-12-20 NOTE — Telephone Encounter (Signed)
Patient has had two acute visits with Selena Batten NP and one ER visit needs referral to ENT does she need Appt with you first.

## 2019-12-21 ENCOUNTER — Other Ambulatory Visit: Payer: Self-pay | Admitting: Internal Medicine

## 2019-12-21 DIAGNOSIS — J386 Stenosis of larynx: Secondary | ICD-10-CM

## 2019-12-30 ENCOUNTER — Other Ambulatory Visit: Payer: Self-pay | Admitting: Internal Medicine

## 2019-12-30 ENCOUNTER — Telehealth: Payer: Self-pay | Admitting: Internal Medicine

## 2019-12-30 DIAGNOSIS — R928 Other abnormal and inconclusive findings on diagnostic imaging of breast: Secondary | ICD-10-CM

## 2019-12-30 DIAGNOSIS — Z1231 Encounter for screening mammogram for malignant neoplasm of breast: Secondary | ICD-10-CM

## 2019-12-30 NOTE — Telephone Encounter (Signed)
Pt called [facility] to schedule appt. They informed her that the order was incorrect. States that it was supposed to be diagnostic instead of screening? Please call to advise

## 2019-12-30 NOTE — Telephone Encounter (Signed)
Order has been placed and pt is aware that she can call to schedule the appt.

## 2019-12-30 NOTE — Addendum Note (Signed)
Addended by: Sherlene Shams on: 12/30/2019 05:28 PM   Modules accepted: Orders

## 2019-12-30 NOTE — Telephone Encounter (Signed)
Pt called in need an order for her annual breast exam sent to [facility] clinic

## 2019-12-30 NOTE — Telephone Encounter (Signed)
Yes she was supposed to have a diagnostic mammogram ordered   I have ordered the right one.

## 2019-12-31 NOTE — Telephone Encounter (Signed)
LMTCB

## 2020-01-03 ENCOUNTER — Other Ambulatory Visit: Payer: Self-pay

## 2020-01-03 ENCOUNTER — Ambulatory Visit: Payer: No Typology Code available for payment source | Admitting: Family Medicine

## 2020-01-03 ENCOUNTER — Encounter: Payer: Self-pay | Admitting: Family Medicine

## 2020-01-03 VITALS — BP 122/84 | HR 90 | Ht 63.0 in | Wt 283.0 lb

## 2020-01-03 DIAGNOSIS — M5412 Radiculopathy, cervical region: Secondary | ICD-10-CM | POA: Diagnosis not present

## 2020-01-03 DIAGNOSIS — M999 Biomechanical lesion, unspecified: Secondary | ICD-10-CM

## 2020-01-03 NOTE — Telephone Encounter (Signed)
scheduled

## 2020-01-03 NOTE — Patient Instructions (Signed)
Can take up to 3 gabapentin at night See me in 8 weeks if needed

## 2020-01-03 NOTE — Assessment & Plan Note (Signed)
Negative Spurling's today Doing relatively well.  Responded well to osteopathic manipulation again today.  Patient will continue with the gabapentin and refill given.  Zanaflex for breakthrough pain.  Chronic problem with mild exacerbation.  Will follow up with me again in 2 months

## 2020-01-03 NOTE — Progress Notes (Signed)
[name] [address] [place] [address] Phone: [phone] Subjective:   [name], am serving as a scribe for Dr. Antoine Primas. This visit occurred during the SARS-CoV-2 public health emergency.  Safety protocols were in place, including screening questions prior to the visit, additional usage of staff PPE, and extensive cleaning of exam room while observing appropriate contact time as indicated for disinfecting solutions.   I'm seeing this patient by the request  of:  Sherlene Shams, MD  CC:   CBS:[id number]  Lisa [name] is a 44 y.o. female coming in with complaint of back, neck and left shoulder pain. OMT 04/29/2019. Patient states that she is having numbness and tingling into left arm. Patient doing exercises and taking gabapentin. Was managing pain until one month ago. Pain in ribs that can feel like a spasm.   Medications patient has been prescribed: Gabapentin  Taking: Yes         Reviewed prior external information including notes and imaging from previsou exam, outside providers and external EMR if available.   As well as notes that were available from care everywhere and other healthcare systems.  Past medical history, social, surgical and family history all reviewed in electronic medical record.  No pertanent information unless stated regarding to the chief complaint.   Past Medical History:  Diagnosis Date  . Bronchitis    hx of  . Complication of anesthesia   . Dysrhythmia    "does not see cardiologist"  . Family history of anesthesia complication    "morphine night terrors"  . GERD (gastroesophageal reflux disease)   . Headache(784.0)    "migraines"  . Hypertension   . PONV (postoperative nausea and vomiting)   . Shortness of breath    "wheezing"    Allergies  Allergen Reactions  . Latex Hives    Rash and hives     Review of Systems:  No headache, visual changes, nausea, vomiting, diarrhea,  constipation, dizziness, abdominal pain, skin rash, fevers, chills, night sweats, weight loss, swollen lymph nodes, body aches, joint swelling, chest pain, shortness of breath, mood changes. POSITIVE muscle aches  Objective  Blood pressure 122/84, pulse 90, height 5\' 3"  (1.6 m), weight 283 lb (128.4 kg), SpO2 99 %.   General: No apparent distress alert and oriented x3 mood and affect normal, dressed appropriately.  Overweight HEENT: Pupils equal, extraocular movements intact  Respiratory: Patient's speak in full sentences and does not appear short of breath  Cardiovascular: No lower extremity edema, non tender, no erythema  Neuro: Cranial nerves II through XII are intact, neurovascularly intact in all extremities with 2+ DTRs and 2+ pulses.  Gait normal with good balance and coordination.  MSK:  Non tender with full range of motion and good stability and symmetric strength and tone of shoulders, elbows, wrist, hip, knee and ankles bilaterally.  Neck exam still shows that patient does have loss of lordosis.  Tenderness to palpation in the paraspinal musculature of the cervical spine to the scapula.  Osteopathic findings  C7 flexed rotated and side bent left T3 extended rotated and side bent right inhaled rib T8 extended rotated and side bent left       Assessment and Plan:  Cervical radiculopathy at C7 Negative Spurling's today Doing relatively well.  Responded well to osteopathic manipulation again today.  Patient will continue with the gabapentin and refill given.  Zanaflex for breakthrough pain.  Chronic problem with mild exacerbation.  Will  follow up with me again in 2 months    Nonallopathic problems  Decision today to treat with OMT was based on Physical Exam  After verbal consent patient was treated with HVLA, ME, FPR techniques in cervical, rib, thoracic areas  Patient tolerated the procedure well with improvement in symptoms  Patient given exercises, stretches and  lifestyle modifications  See medications in patient instructions if given  Patient will follow up in 4-8 weeks      The above documentation has been reviewed and is accurate and complete [name], DO       Note: This dictation was prepared with Dragon dictation along with smaller phrase technology. Any transcriptional errors that result from this process are unintentional.

## 2020-01-11 ENCOUNTER — Encounter: Payer: Self-pay | Admitting: Family Medicine

## 2020-01-13 MED ORDER — MELOXICAM 15 MG PO TABS: 15.0000 mg | ORAL_TABLET | Freq: Every day | ORAL | 0 refills | Status: DC

## 2020-01-30 ENCOUNTER — Ambulatory Visit
Admission: RE | Admit: 2020-01-30 | Discharge: 2020-01-30 | Disposition: A | Discharge status: Home / Self Care | Payer: No Typology Code available for payment source | Source: Ambulatory Visit | Attending: Internal Medicine | Admitting: Internal Medicine

## 2020-01-30 ENCOUNTER — Other Ambulatory Visit: Payer: Self-pay

## 2020-01-30 DIAGNOSIS — R928 Other abnormal and inconclusive findings on diagnostic imaging of breast: Secondary | ICD-10-CM | POA: Diagnosis present

## 2020-01-30 MED ORDER — PREDNISONE 50 MG PO TABS: 50.0000 mg | ORAL_TABLET | Freq: Every day | ORAL | 0 refills | Status: DC

## 2020-01-30 NOTE — Addendum Note (Signed)
Addended by: [name] on: 01/30/2020 02:25 PM   Modules accepted: Orders

## 2020-02-12 ENCOUNTER — Encounter: Payer: Self-pay | Admitting: Internal Medicine

## 2020-02-13 NOTE — Progress Notes (Signed)
[name] [address] [place] [address] Phone: [phone] Subjective:   I Lisa [name] am serving as a Neurosurgeon for Dr. Antoine Primas.  This visit occurred during the SARS-CoV-2 public health emergency.  Safety protocols were in place, including screening questions prior to the visit, additional usage of staff PPE, and extensive cleaning of exam room while observing appropriate contact time as indicated for disinfecting solutions.   I'm seeing this patient by the request  of:  Sherlene Shams, MD  CC: neck pain   GEX:[id number]   01/03/2020 Negative Spurling's today Doing relatively well.  Responded well to osteopathic manipulation again today.  Patient will continue with the gabapentin and refill given.  Zanaflex for breakthrough pain.  Chronic problem with mild exacerbation.  Will follow up with me again in 2 months  02/14/2020 Lisa [name] is a 44 y.o. female coming in with complaint of neck pain. Feels like her pain is worse than last visit. Radiating pain down to the left wrist. One week ago woke up with acute muscular spasm. Tried prednisone and feels she only improved 50%. Feels like arm is weak. Patient was using flexeril but has transitioned to zanaflex. Had to take off yesterday due to pain. Has seen massage therapist who was able to recreated her pain when massaging scalenes.     Patient did have cervical neck x-rays in November 2020.  These were independently visualized by me.  Patient's x-rays do show some foraminal narrowing from C3-C6 left greater than right.  Past Medical History:  Diagnosis Date  . Bronchitis    hx of  . Complication of anesthesia   . Dysrhythmia    "does not see cardiologist"  . Family history of anesthesia complication    "morphine night terrors"  . GERD (gastroesophageal reflux disease)   . Headache(784.0)    "migraines"  . Hypertension   . PONV (postoperative nausea and vomiting)   . Shortness of  breath    "wheezing"   Past Surgical History:  Procedure Laterality Date  . CESAREAN SECTION     x 2  . MICROLARYNGOSCOPY WITH LASER AND BALLOON DILATION N/A 07/20/2012   Procedure: MICROLARYNGOSCOPY WITH LASER AND BALLOON DILATION;  Surgeon: Christia Reading, MD;  Location: [facility] OR;  Service: ENT;  Laterality: N/A;  WITH JET VENTURI VENTILATION  . TONGUE FLAP RELEASE    . widom extractions     Social History   Socioeconomic History  . Marital status: Married    Spouse name: Not on file  . Number of children: Not on file  . Years of education: Not on file  . Highest education level: Not on file  Occupational History  . Not on file  Tobacco Use  . Smoking status: Never Smoker  . Smokeless tobacco: Never Used  Vaping Use  . Vaping Use: Never used  Substance and Sexual Activity  . Alcohol use: Yes    Comment: "social"  . Drug use: No  . Sexual activity: Yes    Birth control/protection: Implant  Other Topics Concern  . Not on file  Social History Narrative  . Not on file   Social Determinants of Health   Financial Resource Strain:   . Difficulty of Paying Living Expenses: Not on file  Food Insecurity:   . Worried About Programme researcher, broadcasting/film/video in the Last Year: Not on file  . Ran Out of Food in the Last Year: Not on file  Transportation Needs:   .  Lack of Transportation (Medical): Not on file  . Lack of Transportation (Non-Medical): Not on file  Physical Activity:   . Days of Exercise per Week: Not on file  . Minutes of Exercise per Session: Not on file  Stress:   . Feeling of Stress : Not on file  Social Connections:   . Frequency of Communication with Friends and Family: Not on file  . Frequency of Social Gatherings with Friends and Family: Not on file  . Attends Religious Services: Not on file  . Active Member of Clubs or Organizations: Not on file  . Attends Banker Meetings: Not on file  . Marital Status: Not on file   Allergies  Allergen Reactions  .  Latex Hives    Rash and hives   Family History  Problem Relation Age of Onset  . Hypertension Mother   . Hyperlipidemia Mother   . Arthritis Mother   . Hypertension Father   . Hyperlipidemia Father   . Breast cancer Neg Hx     Current Outpatient Medications (Endocrine & Metabolic):  .  etonogestrel (NEXPLANON) 68 MG IMPL implant, 1 each by Subdermal route once. .  predniSONE (DELTASONE) 50 MG tablet, Take 1 tablet (50 mg total) by mouth daily.   Current Outpatient Medications (Respiratory):  .  albuterol (VENTOLIN HFA) 108 (90 Base) MCG/ACT inhaler, Inhale into the lungs. .  cetirizine (ZYRTEC) 10 MG tablet, Take 10 mg by mouth daily. .  fluticasone (FLONASE) 50 MCG/ACT nasal spray, Place 2 sprays into both nostrils daily. .  fluticasone (FLOVENT HFA) 110 MCG/ACT inhaler, Inhale 1 puff into the lungs 2 (two) times daily as needed.  Current Outpatient Medications (Analgesics):  .  meloxicam (MOBIC) 15 MG tablet, Take 1 tablet (15 mg total) by mouth daily.   Current Outpatient Medications (Other):  .  cholecalciferol (VITAMIN D3) 25 MCG (1000 UNIT) tablet, Take 1,000 Units by mouth daily. .  cyclobenzaprine (FLEXERIL) 5 MG tablet, Take 1 tablet (5 mg total) by mouth 3 (three) times daily as needed. Marland Kitchen  DENAVIR 1 % cream, APPLY TOPICALLY EVERY TWO HOURS FOR 4 DAYS AS NEEDED FOR COLD SORES .  gabapentin (NEURONTIN) 100 MG capsule, Take 2 capsules (200 mg total) by mouth at bedtime. .  Misc Natural Products (TART CHERRY ADVANCED PO), Take 1 capsule by mouth daily. .  mometasone (ELOCON) 0.1 % cream, Apply 1 application topically daily. Marland Kitchen  omeprazole (PRILOSEC) 40 MG capsule, TAKE 1 CAPSULE BY MOUTH DAILY. Marland Kitchen  tiZANidine (ZANAFLEX) 4 MG capsule, TAKE 1 CAPSULE BY MOUTH THREE TIMES DAILY .  Turmeric 400 MG CAPS, Take 1 capsule by mouth. .  diazepam (VALIUM) 5 MG tablet, One tab by mouth, 2 hours before procedure. .  gabapentin (NEURONTIN) 300 MG capsule, Take 1 capsule (300 mg total) by  mouth at bedtime.   Reviewed prior external information including notes and imaging from  primary care provider As well as notes that were available from care everywhere and other healthcare systems.  Past medical history, social, surgical and family history all reviewed in electronic medical record.  No pertanent information unless stated regarding to the chief complaint.   Review of Systems:  No headache, visual changes, nausea, vomiting, diarrhea, constipation, dizziness, abdominal pain, skin rash, fevers, chills, night sweats, weight loss, swollen lymph nodes, , joint swelling, chest pain, shortness of breath, mood changes. POSITIVE muscle aches, body aches   Objective  Blood pressure 122/88, pulse 78, height 5\' 3"  (1.6 m), weight 287  lb (130.2 kg), SpO2 98 %.   General: No apparent distress alert and oriented x3 mood and affect normal, dressed appropriately.  HEENT: Pupils equal, extraocular movements intact  Respiratory: Patient's speak in full sentences and does not appear short of breath  Cardiovascular: No lower extremity edema, non tender, no erythema  Neuro: Cranial nerves II through XII are intact, neurovascularly intact in all extremities with 2+ DTRs and 2+ pulses.  Gait normal with good balance and coordination.  MSK: Neck exam shows the patient does have significant decrease in lordosis of the neck.  Positive Spurling's with radicular symptoms in the C7 distribution.  Patient is holding her arm very close to her body.  Does appear to be though neurovascularly intact distally.  Patient very uncomfortable and did not do the rest of the range of motion exercises    Impression and Recommendations:     The above documentation has been reviewed and is accurate and complete [name], DO

## 2020-02-14 ENCOUNTER — Ambulatory Visit (INDEPENDENT_AMBULATORY_CARE_PROVIDER_SITE_OTHER): Payer: No Typology Code available for payment source | Admitting: Family Medicine

## 2020-02-14 ENCOUNTER — Other Ambulatory Visit: Payer: Self-pay | Admitting: Family Medicine

## 2020-02-14 ENCOUNTER — Encounter: Payer: Self-pay | Admitting: Family Medicine

## 2020-02-14 ENCOUNTER — Other Ambulatory Visit: Payer: Self-pay

## 2020-02-14 VITALS — BP 122/88 | HR 78 | Ht 63.0 in | Wt 287.0 lb

## 2020-02-14 DIAGNOSIS — G8929 Other chronic pain: Secondary | ICD-10-CM | POA: Diagnosis not present

## 2020-02-14 DIAGNOSIS — M542 Cervicalgia: Secondary | ICD-10-CM | POA: Diagnosis not present

## 2020-02-14 DIAGNOSIS — M5412 Radiculopathy, cervical region: Secondary | ICD-10-CM | POA: Diagnosis not present

## 2020-02-14 MED ORDER — KETOROLAC TROMETHAMINE 60 MG/2ML IM SOLN
60.0000 mg | Freq: Once | INTRAMUSCULAR | Status: AC
  Administered 2020-02-14: 60 mg via INTRAMUSCULAR

## 2020-02-14 MED ORDER — CYCLOBENZAPRINE HCL 5 MG PO TABS
5.0000 mg | ORAL_TABLET | Freq: Three times a day (TID) | ORAL | 0 refills | Status: DC | PRN
Start: 2020-02-14 — End: 2020-02-14

## 2020-02-14 MED ORDER — METHYLPREDNISOLONE ACETATE 80 MG/ML IJ SUSP
80.0000 mg | Freq: Once | INTRAMUSCULAR | Status: AC
  Administered 2020-02-14: 80 mg via INTRAMUSCULAR

## 2020-02-14 MED ORDER — DIAZEPAM 5 MG PO TABS: ORAL_TABLET | ORAL | 0 refills | Status: DC

## 2020-02-14 MED ORDER — GABAPENTIN 300 MG PO CAPS: 300.0000 mg | ORAL_CAPSULE | Freq: Every day | ORAL | 3 refills | Status: DC

## 2020-02-14 NOTE — Assessment & Plan Note (Signed)
Patient is continuing to have significant amount of discomfort and pain of the arm that I think is secondary to more of a cervical radiculopathy.  Patient's weakness has still somewhat but there but not as severe.  Toradol and Depo-Medrol given today.  Modified Flexeril, increase gabapentin, MRI pending and patient given Valium to help her with any type of claustrophobia.  Patient will follow up after imaging and will discuss further.  Worsening pain seek medical attention immediately.

## 2020-02-14 NOTE — Patient Instructions (Addendum)
    MRI cervical- [phone] Valium sent in Gabapentin 100mg  am and pm, 300mg  at night Flexeril OR Zanaflex Ok to start IBU around lunch if needed I will write you after MRI Worsening pain over weekend go to emergency room

## 2020-02-17 ENCOUNTER — Ambulatory Visit: Payer: No Typology Code available for payment source | Admitting: Internal Medicine

## 2020-02-18 ENCOUNTER — Telehealth: Payer: Self-pay | Admitting: Family Medicine

## 2020-02-18 NOTE — Telephone Encounter (Signed)
Pt has MRI pending. 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 Please advise patient when done, [phone]

## 2020-02-20 ENCOUNTER — Other Ambulatory Visit: Payer: Self-pay | Admitting: Otolaryngology

## 2020-02-20 ENCOUNTER — Other Ambulatory Visit: Payer: Self-pay

## 2020-02-20 ENCOUNTER — Ambulatory Visit
Admission: RE | Admit: 2020-02-20 | Discharge: 2020-02-20 | Disposition: A | Discharge status: Home / Self Care | Payer: No Typology Code available for payment source | Source: Ambulatory Visit | Attending: Family Medicine | Admitting: Family Medicine

## 2020-02-20 DIAGNOSIS — M542 Cervicalgia: Secondary | ICD-10-CM | POA: Diagnosis not present

## 2020-02-20 DIAGNOSIS — G8929 Other chronic pain: Secondary | ICD-10-CM | POA: Insufficient documentation

## 2020-02-21 ENCOUNTER — Encounter: Payer: Self-pay | Admitting: Family Medicine

## 2020-02-24 ENCOUNTER — Other Ambulatory Visit: Payer: Self-pay

## 2020-02-24 DIAGNOSIS — M5010 Cervical disc disorder with radiculopathy, unspecified cervical region: Secondary | ICD-10-CM

## 2020-02-24 DIAGNOSIS — M5412 Radiculopathy, cervical region: Secondary | ICD-10-CM

## 2020-02-24 NOTE — Telephone Encounter (Signed)
Patient called asking if the order could be sent to Dr Pernell Dupre at [facility].

## 2020-02-28 ENCOUNTER — Encounter: Payer: Self-pay | Admitting: Anesthesiology

## 2020-02-28 ENCOUNTER — Other Ambulatory Visit: Payer: Self-pay

## 2020-02-28 ENCOUNTER — Ambulatory Visit: Payer: No Typology Code available for payment source | Attending: Anesthesiology | Admitting: Anesthesiology

## 2020-02-28 DIAGNOSIS — M5412 Radiculopathy, cervical region: Secondary | ICD-10-CM | POA: Diagnosis not present

## 2020-02-28 DIAGNOSIS — R29898 Other symptoms and signs involving the musculoskeletal system: Secondary | ICD-10-CM

## 2020-02-28 DIAGNOSIS — M542 Cervicalgia: Secondary | ICD-10-CM | POA: Insufficient documentation

## 2020-02-28 DIAGNOSIS — M5030 Other cervical disc degeneration, unspecified cervical region: Secondary | ICD-10-CM | POA: Diagnosis not present

## 2020-02-28 DIAGNOSIS — M4712 Other spondylosis with myelopathy, cervical region: Secondary | ICD-10-CM | POA: Insufficient documentation

## 2020-02-28 HISTORY — DX: Other cervical disc degeneration, unspecified cervical region: M50.30

## 2020-03-02 ENCOUNTER — Encounter: Payer: Self-pay | Admitting: Family Medicine

## 2020-03-02 ENCOUNTER — Ambulatory Visit: Payer: No Typology Code available for payment source

## 2020-03-02 NOTE — Progress Notes (Signed)
[name] [address] [place] [address] Phone: [phone] Subjective:   Lisa [name], am serving as a scribe for Dr. Antoine [name]. This visit occurred during the SARS-CoV-2 public health emergency.  Safety protocols were in place, including screening questions prior to the visit, additional usage of staff PPE, and extensive cleaning of exam room while observing appropriate contact time as indicated for disinfecting solutions.   I'm seeing this patient by the request  of:  Lisa Shams, MD  CC: Back and neck pain follow-up  [id number]   02/14/2020 Patient is continuing to have significant amount of discomfort and pain of the arm that I think is secondary to more of a cervical radiculopathy.  Patient's weakness has still somewhat but there but not as severe.  Toradol and Depo-Medrol given today.  Modified Flexeril, increase gabapentin, MRI pending and patient given Valium to help her with any type of claustrophobia.  Patient will follow up after imaging and will discuss further.  Worsening pain seek medical attention immediately.   Update 03/02/2020 Lisa [name] is a 44 y.o. female coming in with complaint of back and neck pain. Patient states that her left scapula and rib cage is painful. Pain increases with movement. Has not started PT yet.  Patient states that he is feeling much better.  Did have the other data where there is some mild radicular symptoms going down the arm again.  First time since the injection and patient understood.         Reviewed prior external information including notes and imaging from previsou exam, outside providers and external EMR if available.   As well as notes that were available from care everywhere and other healthcare systems.  Past medical history, social, surgical and family history all reviewed in electronic medical record.  No pertanent information unless stated regarding to the chief  complaint.   Past Medical History:  Diagnosis Date  . Bronchitis    hx of  . Complication of anesthesia   . DDD (degenerative disc disease), cervical 02/28/2020  . Dysrhythmia    "does not see cardiologist"  . Family history of anesthesia complication    "morphine night terrors"  . GERD (gastroesophageal reflux disease)   . Headache(784.0)    "migraines"  . Hypertension   . PONV (postoperative nausea and vomiting)   . Shortness of breath    "wheezing"    Allergies  Allergen Reactions  . Latex Hives    Rash and hives     Review of Systems:  No headache, visual changes, nausea, vomiting, diarrhea, constipation, dizziness, abdominal pain, skin rash, fevers, chills, night sweats, weight loss, swollen lymph nodes, body aches, joint swelling, chest pain, shortness of breath, mood changes. POSITIVE muscle aches  Objective  Blood pressure 122/78, pulse 93, height 5\' 3"  (1.6 m), weight 290 lb (131.5 kg), SpO2 99 %.   General: No apparent distress alert and oriented x3 mood and affect normal, dressed appropriately.  HEENT: Pupils equal, extraocular movements intact  Respiratory: Patient's speak in full sentences and does not appear short of breath  Cardiovascular: No lower extremity edema, non tender, no erythema  Neuro: Cranial nerves II through XII are intact, neurovascularly intact in all extremities with 2+ DTRs and 2+ pulses.  Gait normal with good balance and coordination.  MSK:  Non tender with full range of motion and good stability and symmetric strength and tone of shoulders, elbows, wrist, hip, knee and ankles bilaterally.  Back - Normal skin, Spine with normal alignment and no deformity.  No tenderness to vertebral process palpation.  Paraspinous muscles are not tender and without spasm.   Range of motion is full at neck and lumbar sacral regions  Osteopathic findings  C2 flexed rotated and side bent right C6 flexed rotated and side bent left T3 extended rotated and  side bent right inhaled rib T9 extended rotated and side bent left     Assessment and Plan:  Cervical radiculopathy at C7 Patient had a nerve root impingement noted on MRI.  Responded well to the epidural.  Encouraged her to start with formal physical.  We did do manipulation of the upper back but felt on anything more than some FPR of the cervical region.  Patient will start home exercise more regularly, we discussed medication such as the Flexeril for breakthrough.  Follow-up with me again 6 to 8 weeks Patient is awaiting physical therapy.  Nonallopathic problems  Decision today to treat with OMT was based on Physical Exam  After verbal consent patient was treated with  ME, FPR techniques in cervical, rib, thoracic,areas avoided HVLA in all areas secondary to patient's recent nerve impingement.  Patient tolerated the procedure well with improvement in symptoms  Patient given exercises, stretches and lifestyle modifications  See medications in patient instructions if given  Patient will follow up in 6-8 weeks      The above documentation has been reviewed and is accurate and complete Lisa Saa, DO       Note: This dictation was prepared with Dragon dictation along with smaller phrase technology. Any transcriptional errors that result from this process are unintentional.

## 2020-03-03 ENCOUNTER — Encounter: Payer: Self-pay | Admitting: Anesthesiology

## 2020-03-03 ENCOUNTER — Ambulatory Visit: Payer: No Typology Code available for payment source | Admitting: Family Medicine

## 2020-03-03 ENCOUNTER — Other Ambulatory Visit: Payer: Self-pay

## 2020-03-03 ENCOUNTER — Encounter: Payer: Self-pay | Admitting: Family Medicine

## 2020-03-03 VITALS — BP 122/78 | HR 93 | Ht 63.0 in | Wt 290.0 lb

## 2020-03-03 DIAGNOSIS — R29898 Other symptoms and signs involving the musculoskeletal system: Secondary | ICD-10-CM

## 2020-03-03 DIAGNOSIS — M5412 Radiculopathy, cervical region: Secondary | ICD-10-CM | POA: Diagnosis not present

## 2020-03-03 DIAGNOSIS — M999 Biomechanical lesion, unspecified: Secondary | ICD-10-CM | POA: Diagnosis not present

## 2020-03-03 HISTORY — DX: Radiculopathy, cervical region: M54.12

## 2020-03-03 HISTORY — DX: Other symptoms and signs involving the musculoskeletal system: R29.898

## 2020-03-03 NOTE — Patient Instructions (Addendum)
[facility] [phone] Keep doing exercises No change in medicine  See me in 4-5 weeks

## 2020-03-03 NOTE — Assessment & Plan Note (Signed)
Patient had a nerve root impingement noted on MRI.  Responded well to the epidural.  Encouraged her to start with formal physical.  We did do manipulation of the upper back but felt on anything more than some FPR of the cervical region.  Patient will start home exercise more regularly, we discussed medication such as the Flexeril for breakthrough.  Follow-up with me again 6 to 8 weeks

## 2020-03-03 NOTE — Progress Notes (Signed)
Virtual Visit via Telephone Note  I connected with KRISTI NORMENT on 03/03/20 at  2:05 PM EDT by telephone and verified that I am speaking with the correct person using two identifiers.  Location: Patient: Home Provider: Pain control center   I discussed the limitations, risks, security and privacy concerns of performing an evaluation and management service by telephone and the availability of in person appointments. I also discussed with the patient that there may be a patient responsible charge related to this service. The patient expressed understanding and agreed to proceed.   History of Present Illness: I spoke with Sarajane Marek today via telephone.  We were unable to link up for the video portion of the virtual conference.  She is a new patient evaluation in a patient with cervical neck pain and has recently reported some left upper extremity weakness.  She states that she has had a recent MRI that showed evidence of some C5-6 disc compression with an osteophyte and foraminal stenosis.  She has had chronic neck pain but this is been more significant recently and she is having trouble getting any relief.  She also is having more frequent left upper extremity numbness and tingling and weakness reported.  She denies previous physical therapy and describes the pain as aching gnawing in the cervical region with radiation into the shoulder and arm left side left side with intermittent numbness and tingling.  When she is active or doing certain activities she has begun to develop some generalized left upper extremity weakness.  She denies previous cervical epidural steroid injections or interventional therapy.  She reports that a cervical epidural steroid injection has been recommended for her.    Observations/Objective:  Current Outpatient Medications:  .  albuterol (VENTOLIN HFA) 108 (90 Base) MCG/ACT inhaler, Inhale into the lungs., Disp: , Rfl:  .  cetirizine (ZYRTEC) 10 MG tablet, Take  10 mg by mouth daily., Disp: , Rfl:  .  cholecalciferol (VITAMIN D3) 25 MCG (1000 UNIT) tablet, Take 1,000 Units by mouth daily., Disp: , Rfl:  .  cyclobenzaprine (FLEXERIL) 5 MG tablet, Take 1 tablet (5 mg total) by mouth 3 (three) times daily as needed., Disp: 30 tablet, Rfl: 0 .  DENAVIR 1 % cream, APPLY TOPICALLY EVERY TWO HOURS FOR 4 DAYS AS NEEDED FOR COLD SORES, Disp: 5 g, Rfl: 0 .  diazepam (VALIUM) 5 MG tablet, One tab by mouth, 2 hours before procedure., Disp: 2 tablet, Rfl: 0 .  etonogestrel (NEXPLANON) 68 MG IMPL implant, 1 each by Subdermal route once., Disp: , Rfl:  .  fluticasone (FLONASE) 50 MCG/ACT nasal spray, Place 2 sprays into both nostrils daily., Disp: 16 g, Rfl: 2 .  fluticasone (FLOVENT HFA) 110 MCG/ACT inhaler, Inhale 1 puff into the lungs 2 (two) times daily as needed., Disp: 1 Inhaler, Rfl: 12 .  gabapentin (NEURONTIN) 100 MG capsule, Take 2 capsules (200 mg total) by mouth at bedtime., Disp: 180 capsule, Rfl: 3 .  gabapentin (NEURONTIN) 300 MG capsule, Take 1 capsule (300 mg total) by mouth at bedtime., Disp: 90 capsule, Rfl: 3 .  meloxicam (MOBIC) 15 MG tablet, Take 1 tablet (15 mg total) by mouth daily., Disp: 30 tablet, Rfl: 0 .  Misc Natural Products (TART CHERRY ADVANCED PO), Take 1 capsule by mouth daily., Disp: , Rfl:  .  mometasone (ELOCON) 0.1 % cream, Apply 1 application topically daily., Disp: 45 g, Rfl: 0 .  omeprazole (PRILOSEC) 40 MG capsule, TAKE 1 CAPSULE BY MOUTH DAILY., Disp:  90 capsule, Rfl: 1 .  predniSONE (DELTASONE) 50 MG tablet, Take 1 tablet (50 mg total) by mouth daily., Disp: 5 tablet, Rfl: 0 .  tiZANidine (ZANAFLEX) 4 MG capsule, TAKE 1 CAPSULE BY MOUTH THREE TIMES DAILY, Disp: 90 capsule, Rfl: 0 .  Turmeric 400 MG CAPS, Take 1 capsule by mouth., Disp: , Rfl:  Physical exam is deferred Assessment and Plan:  1. Cervical radiculopathy at C7   2. Cervicalgia   3. DDD (degenerative disc disease), cervical   4. Cervical radiculitis   5.  Weakness of left arm    Follow Up Instructions: At this point I feel that physical therapy with potential traction and TENS unit application would be warranted.  I would like her to try this in advance of a cervical epidural steroid as reviewed with her today.  Ultimately the epidural may be warranted and we gone over the risks and benefits of this today.  I am scheduling her for a return to clinic in a month and want her to continue with her current medication regimen.  She is also to follow-up with her primary care physicians for her baseline medical care.   I discussed the assessment and treatment plan with the patient. The patient was provided an opportunity to ask questions and all were answered. The patient agreed with the plan and demonstrated an understanding of the instructions.   The patient was advised to call back or seek an in-person evaluation if the symptoms worsen or if the condition fails to improve as anticipated.  I provided 30 minutes of non-face-to-face time during this encounter.   Yevette Edwards, MD

## 2020-03-04 ENCOUNTER — Other Ambulatory Visit: Payer: Self-pay

## 2020-03-04 ENCOUNTER — Ambulatory Visit: Payer: No Typology Code available for payment source | Attending: Anesthesiology | Admitting: Physical Therapy

## 2020-03-04 ENCOUNTER — Telehealth: Payer: Self-pay

## 2020-03-04 DIAGNOSIS — M546 Pain in thoracic spine: Secondary | ICD-10-CM | POA: Diagnosis present

## 2020-03-04 DIAGNOSIS — R519 Headache, unspecified: Secondary | ICD-10-CM | POA: Insufficient documentation

## 2020-03-04 DIAGNOSIS — M542 Cervicalgia: Secondary | ICD-10-CM | POA: Insufficient documentation

## 2020-03-04 DIAGNOSIS — M5412 Radiculopathy, cervical region: Secondary | ICD-10-CM | POA: Insufficient documentation

## 2020-03-04 NOTE — Telephone Encounter (Signed)
I will ask and let you know.

## 2020-03-04 NOTE — Telephone Encounter (Signed)
Called patient to schedule and she wants to try PT first to see if that helps before getting a procedure. If she calls after 04/03/20 I will need to get a new authorization.

## 2020-03-04 NOTE — Therapy (Signed)
[place] [facility] PHYSICAL AND SPORTS MEDICINE [address] [place], [address] Phone: [phone]   Fax:  [phone]  Physical Therapy Evaluation  Patient Details  Name: Lisa [name] MRN: [id number] Date of Birth: 10-Dec-1975 Referring Provider (PT): Yevette Edwards, MD (pain clinic)   Encounter Date: 03/04/2020   PT End of Session - 03/05/20 1909    Visit Number 1    Number of Visits 24    Date for PT Re-Evaluation 05/27/20    Authorization Type [facility] [employer] reporting period from 03/04/2020    Progress Note Due on Visit 10    PT Start Time 1416    PT Stop Time 1516    PT Time Calculation (min) 60 min    Activity Tolerance Patient tolerated treatment well    Behavior During Therapy [facility] for tasks assessed/performed           Past Medical History:  Diagnosis Date  . Bronchitis    hx of  . Cervical radiculitis 03/03/2020  . Complication of anesthesia   . DDD (degenerative disc disease), cervical 02/28/2020  . Dysrhythmia    "does not see cardiologist"  . Family history of anesthesia complication    "morphine night terrors"  . GERD (gastroesophageal reflux disease)   . Headache(784.0)    "migraines"  . Hypertension   . PONV (postoperative nausea and vomiting)   . Shortness of breath    "wheezing"  . Weakness of left arm 03/03/2020    Past Surgical History:  Procedure Laterality Date  . CESAREAN SECTION     x 2  . MICROLARYNGOSCOPY WITH LASER AND BALLOON DILATION N/A 07/20/2012   Procedure: MICROLARYNGOSCOPY WITH LASER AND BALLOON DILATION;  Surgeon: Christia Reading, MD;  Location: [facility] OR;  Service: ENT;  Laterality: N/A;  WITH JET VENTURI VENTILATION  . TONGUE FLAP RELEASE    . widom extractions      There were no vitals filed for this visit.    Subjective Assessment - 03/04/20 1452    Subjective Patient stated she started out with some chronic pain (gradually over the last 10 years starting with headaches) in her  neck and left shoulder that caused headaches and facial pain (no known traumatic injury - has been told maybe from sleeping funny on left arm). This is usually aggravated holding manual pressure on a groin with left UE with hand in a C position. Wearing 40lbs of lead at work an poor posture. She was seeing a massage therapist with increasing frequency and was not really getting relief. Went to sports medicine who thought he had a rib out of place. He did some adjustments and put on gabapentin, tart cherry extract, and tumeric and this helped until about 6 weeks ago when she woke up with a "crick" in her neck like she could turn it. Day two she started getting pain down the left arm, intermittent tingling in mostly in the thumb and index finger with weakness.. Pain all the way down to the fingers. Started out with Dr. Katrinka Blazing (DO) gave her 5 days of 50mg  of predgnisone and that "did not work" (temporary 20% improvements - had previously worked). Came in to see him and he gave her a Torodol shot and devomedrol (did not help much). Ordered MRI to see why she was having pain. MRI showed osteophyte and some narrowing. Gave her the option of epidural injection or physical therapy. She chose the potentially fastest so she could get back to  doing her job better so she went to see Dr. Pernell Dupre at the pain clinic for that and he suggested PT so she did that instead. Over the past weekend her arm didn't hurt but she had her old headache and rib pain. Rib pain had started prior to the 6 weeks and could be in conjunction with the headache, etc.  She went day before yesterday for an adjustment for the rib and it was very painful. This decreased the rib pain and increased the arm pain. She had to go back to work right after and using her arm all day.  She has had some tingling and arm weakness in the left arm with the pain. The weakness is better but not back. It was hard for her to open a package or hold a cup. She still gets tired  quickly. No history of trauma from car accident or surgery. Does get some intermittent numbness and tingling in right fingers that is worse when left side is worse.    Pertinent History Patient is a 44 y.o. female who presents to outpatient physical therapy with a referral for medical diagnosis cervicalgia, cervical degenerative disc disease. This patient's chief complaints consist of increased neck pain and new left UE pain/weakness/paresthesia and left sided rib pain on top of chronic left neck pain radiating to the left face and headache, leading to the following functional deficits: difficulty using left UE for manual tasks, using the phone, work tasks, holding pressure on wounds (unable), holding things without dropping them, opening packages, pulling back a stopper in a syringe, holding a cup, taking lids off, grooming, folding laundry, making the bed, anything that requires two hands and force, gardening, yardwork, raking, clipping, cutting, dressing, showering, sleeping. Relevant past medical history and comorbidities include diabetes (currently controlled), idiopathic subglottic tracheal stenosis (surgery coming up end of month, causes shortness of breath upon exertion), low back pain on left side, obesity, GERD, when she takes caffeine she gest palpitations, high blood pressure.  Patient denies hx of cancer, stroke, seizures, lung problem (besides trachea problem), major cardiac events, unexplained weight loss, changes in bowel or bladder problems, new onset stumbling or dropping things apart from described below.    Limitations House hold activities;Other (comment);Lifting   usinghands and force, gardening, yardwork, raking, clipping, cutting, dressing, showering, sleeping.   Diagnostic tests C-spine MRI report 02/20/2020: "IMPRESSION:1. Minimal disc osteophyte complex with left uncovertebral spurringat C5-C6 resulting in mild left foraminal stenosis.2. No canal stenosis at any level."    Patient  Stated Goals "to not have arm pain"    Currently in Pain? Yes    Pain Score 4    W: 8/10; B: 1-2/10   Pain Location Neck    Pain Orientation Left    Pain Descriptors / Indicators Tingling;Numbness;Pins and needles;Aching;Stabbing    Pain Type Chronic pain;Acute pain    Pain Radiating Towards left arm at elbow radial side of wrist, left eye orbit over head, Also has L rib pain that encircles trunk just below bra line.    Pain Onset More than a month ago    Pain Frequency Constant    Aggravating Factors  using left UE, looking up    Pain Relieving Factors ice and heat, massage therapy (can last for up to 2 weeks)    Effect of Pain on Daily Activities Functional Limitations: using left UE for manual tasks, using the phone, work tasks, holding pressure on wounds (unable), holding things without dropping them, opening packages, pulling back  a stopper in a syringe, holding a cup, taking lids off, grooming, folding laundry, making the bed, anything that requires two hands and force, gardening, yardwork, raking, clipping, cutting, dressing, showering, sleeping.              [facility] PT Assessment - 03/05/20 0001      Assessment   Medical Diagnosis  cervicalgia, cervical degenerative disc disease    Referring Provider (PT) Yevette EdwardsAdams, James G, MD (pain clinic)    Onset Date/Surgical Date --   acute 6 weeks ago, chronic gradually worsening over 10 years   Hand Dominance Right    Next MD Visit no follow up scheduled with Dr. Pernell DupreAdams but Nov 19th with Dr. Katrinka BlazingSmith (DO)    Prior Therapy none for this problem prior to current episode of care      Precautions   Precautions None      Restrictions   Weight Bearing Restrictions No      Balance Screen   Has the patient fallen in the past 6 months No    Has the patient had a decrease in activity level because of a fear of falling?  No    Is the patient reluctant to leave their home because of a fear of falling?  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  Marland Kitchen. Posture : forward head, increased kyphosis at the CT junction.  . Tremor: none . Muscle bulk: generally WFL . Transfers: sit <> stand WFL . Gait: grossly WFL for household and short community ambulation. More detailed gait analysis deferred to later date as needed.  . Stairs   NEUROLOGICAL  Upper Motor Neuron Screen Hoffman's, and Clonus (ankle) negative bilaterally  Dermatomes . C2-T1 appears equal and intact to light touch, except decreased to light touch at C6 on left compared to right.  Myotomes . C5 and C7-T1 appears intact, patient demonstrates weakness at left side at C6 (wrist extension)  Deep Tendon Reflexes R/L  . 2+/1+ Biceps brachii reflex (C5, C6)  SPINE MOTION Cervical Spine AROM *Indicates pain Flexion: = 55. Extension: = 55. Rotation: R= 60, L = 65 pain along L upper trap. Side Flexion: R= 40 pulling at left upper trap and pain above L eye, L = 35 increased pain in left shoulder region. Protrusion =  WNL Retraction = very limited; focal pain at left lower C-spine that remaines  PERIPHERAL JOINT MOTION (in degrees) Active Range of Motion (AROM) Comments: B UE AROM WFL except feels tired on left like "I can't do it long"  MUSCLE PERFORMANCE  (MMT):  *Indicates pain 03/05/20 Date Date  Joint/Motion R/L R/L R/L  Shoulder     Flexion 4+/4 / /  Abduction 5/4+ / /  External  rotation 4/4+ / /  Internal rotation 4+/4+ / /  Extension / / /  Elbow     Flexion 4+/4+ / /  Extension 4+/4+ / /  Wrist     Flexion 4+/4+ / /  Extension 5/3 / /  Comments: grip slightly weaker left compared to right  SPECIAL TESTS: Left Spurling's positive for UT pain.  Negative axial compression, distraction (no arm pain at baseline), right Spurling's test.   ACCESSORY MOTION:  - No significant tenderness noted to CPA in sitting. More detailed assessment deferred as needed.   PALPATION: - No concordant TTP at this time. Will continue to assess as needed at future visit.   EDUCATION/COGNITION: Patient is alert and oriented X 4.  Objective measurements completed on examination: See above findings.     TREATMENT:  DOES have latex allergy   Therapeutic exercise: to centralize symptoms and improve ROM, strength, muscular endurance, and activity tolerance required for successful completion of functional activities.  - Education on diagnosis, prognosis, POC, anatomy and physiology of current condition.  - Education on HEP including handout  - seated left radial nerve glide, slider technique, x 15    Education on [employer] including handout         PT Education - 03/05/20 1909    Education Details Exercise purpose/form. Self management techniques. Education on diagnosis, prognosis, POC, anatomy and physiology of current condition Education on HEP    Person(s) Educated Patient    Methods Explanation;Demonstration;Tactile cues;Verbal cues    Comprehension Verbalized understanding;Returned demonstration;Verbal cues required;Tactile cues required;Need further instruction               PT Long Term Goals - 03/05/20 1919      PT LONG TERM GOAL #1   Title Be independent with a long-term home exercise program for self-management of symptoms.     Baseline initial HEP provided at IE (03/04/2020);    Time 12    Period Weeks    Status New   TARGET DATE FOR ALL LONG TERM GOALS: 05/27/2020     PT LONG TERM GOAL #2   Title Demonstrate improved FOTO score by 10 units to demonstrate improvement in overall condition and self-reported functional ability.    Baseline deferred to visit 2 (03/04/2020);    Time 12    Period Weeks    Status New      PT LONG TERM GOAL #3   Title Improve left UE  strength to equal or greater than  4+/5 for improved ability to allow patient to complete valued functional tasks such as lifting, pressing, grasping, holding, etc,  with less difficulty.    Baseline wrist extension 3/5 and reports weakness with repetition (03/05/2020);    Time 12    Period Weeks    Status New      PT LONG TERM GOAL #4   Title Patient will report no longer having pain down the left arm to improve ability to use left UE for functional tasks such as grasping, pressing, stabilizing.    Baseline 8/10 (03/05/2020);    Time 12    Period Weeks    Status New      PT LONG TERM GOAL #5   Title Complete community, work and/or recreational activities without limitation due to current condition.    Baseline Functional Limitations: using left UE for manual tasks, using the phone, work tasks, holding pressure on wounds (unable), holding things without dropping them, opening packages, pulling back a stopper in a syringe,  holding a cup, taking lids off, grooming, folding laundry, making the bed, anything that requires two hands and force, gardening, yardwork, raking, clipping, cutting, dressing, showering, sleeping (03/04/2020);    Time 12    Period Weeks    Status New                  Plan - 03/05/20 1915    Clinical Impression Statement Patient is a 44 y.o. female referred to outpatient physical therapy with a medical diagnosis of cervicalgia, cervical degenerative disc disease who presents with signs and symptoms consistent with acute  episode of cervical spine pain with radiculopathy to the left UE in the setting of chronic neck pain affecting left side referring up to face and left sided headaches. Patient also has left sided pain into the rib cage and thoracic spine with acute cervical spine. Dermatome and myotome testing suggest C6 nerve root is most affected. Patient does report strength loss in the left UE that affects her work. Patient presents with significant pain, ROM, joint stiffness, paresthesia, sensation, postural, muscle performance (strength/endurance/power) impairments that are limiting ability to complete her usual activities including using left UE for manual tasks, using the phone, work tasks, holding pressure on wounds (unable), holding things without dropping them, opening packages, pulling back a stopper in a syringe, holding a cup, taking lids off, grooming, folding laundry, making the bed, anything that requires two hands and force, gardening, yardwork, raking, clipping, cutting, dressing, showering, sleeping without difficulty. Patient will benefit from skilled physical therapy intervention to address current body structure impairments and activity limitations to improve function and work towards goals set in current POC in order to return to prior level of function or maximal functional improvement    Personal Factors and Comorbidities Age;Comorbidity 3+;Past/Current Experience;Fitness;Time since onset of injury/illness/exacerbation;Profession;Education    Comorbidities Relevant past medical history and comorbidities include diabetes (currently controlled), idiopathic subglottic tracheal stenosis (surgery coming up end of month, causes shortness of breath upon exertion), low back pain on left side, obesity, GERD, when she takes caffeine she gest palpitations, high blood pressure.    Examination-Activity Limitations Bathing;Hygiene/Grooming;Lift;Caring for Others;Reach Overhead;Carry;Dressing;Sleep     Examination-Participation Restrictions Community Activity;Occupation;Yard Work;Meal Prep;Laundry;Cleaning;Interpersonal Relationship;Other   using left UE for manual tasks, using the phone, holding pressure on wounds (unable), holding things without dropping them, opening packages, pulling back a stopper in a syringe, holding a cup, taking lids off, making the bed   Stability/Clinical Decision Making Evolving/Moderate complexity    Clinical Decision Making Moderate    Rehab Potential Good    PT Frequency 2x / week    PT Duration 12 weeks    PT Treatment/Interventions Moist Heat;Therapeutic exercise;Manual techniques;Dry needling;Electrical Stimulation;ADLs/Self Care Home Management;Cryotherapy;Traction;Therapeutic activities;Neuromuscular re-education;Patient/family education;Passive range of motion;Taping;Joint Manipulations;Spinal Manipulations    PT Next Visit Plan add to HEP, manual cervical traction, repeated motions testing, neuro dynamics    PT Home Exercise Plan radial nerve glide    Consulted and Agree with Plan of Care Patient           Patient will benefit from skilled therapeutic intervention in order to improve the following deficits and impairments:  Pain, Decreased activity tolerance, Impaired sensation, Improper body mechanics, Postural dysfunction, Increased muscle spasms, Decreased coordination, Impaired tone, Decreased endurance, Decreased range of motion, Decreased strength, Impaired perceived functional ability, Obesity, Impaired UE functional use  Visit Diagnosis: Radiculopathy, cervical region  Cervicalgia  Nonintractable headache, unspecified chronicity pattern, unspecified headache type  Pain in thoracic spine  Problem List Patient Active Problem List   Diagnosis Date Noted  . Cervical radiculitis 03/03/2020  . Weakness of left arm 03/03/2020  . Cervicalgia 02/28/2020  . Spondylosis, cervical, 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  . Acute URI 12/04/2019  . Acute  maxillary sinusitis 12/04/2019  . Loud snoring 08/01/2019  . B12 deficiency 08/01/2019  . Eczema of left external ear 08/01/2019  . Hyperlipidemia associated with type 2 diabetes mellitus (HCC) 07/31/2019  . Nonallopathic lesion of cervical region 04/29/2019  . Nonallopathic lesion of thoracic region 04/29/2019  . Nonallopathic lesion of rib cage 04/29/2019  . Cervical radiculopathy at C7 03/26/2019  . Abnormal breast exam 01/24/2019  . Elevated ALT measurement 01/24/2019  . Allergic rhinitis due to pollen 08/18/2018  . Abnormal mammogram of right breast 02/27/2018  . Dermatitis 05/02/2017  . Type 2 diabetes mellitus without complications (HCC) 05/02/2017  . Idiopathic subglottic tracheal stenosis 06/21/2016  . Encounter for general adult medical examination with abnormal findings 04/29/2016  . Low back pain without sciatica 04/28/2015  . Class 3 severe obesity without serious comorbidity with body mass index (BMI) of 40.0 to 44.9 in adult (HCC) 04/28/2015  . Pre-syncope 07/14/2014    Luretha Murphy. Ilsa Iha, PT, DPT 03/05/20, 7:25 PM  [facility] PHYSICAL AND SPORTS MEDICINE [address] [place], [address] Phone: [phone]   Fax:  [phone]  Name: Lisa [name] MRN: [id number] Date of Birth: [birth year]

## 2020-03-04 NOTE — Telephone Encounter (Signed)
I received authorization for the cesi. Dr. Pernell Dupre doesn't have any procedure slots this month, and he wanted her in asap. Can you find me a place to put her? I sent him a bubble but he must be off today.

## 2020-03-05 ENCOUNTER — Ambulatory Visit: Payer: No Typology Code available for payment source | Admitting: Family Medicine

## 2020-03-05 ENCOUNTER — Encounter: Payer: Self-pay | Admitting: Physical Therapy

## 2020-03-06 NOTE — Progress Notes (Signed)
[facility] MED ASSISTANCE PROGRAM OF [facility] [address] [place] [address] Phone: [phone] Fax: [phone]  [facility] - [place], [place] - [place] [facility] [address] [place] [address] Phone: [phone] Fax: [phone]  [facility] [address] - [place], [place] - [address] [place] [address] Phone: [phone] Fax: [phone]      Your procedure is scheduled on Friday, October 29th.  Report to [facility] Main Entrance "A" at 8:15 A.M., and check in at the Admitting office.  Call this number if you have problems the morning of surgery:  [phone]  Call [phone] if you have any questions prior to your surgery date Monday-Friday 8am-4pm    Remember:  Do not eat or drink after midnight the night before your surgery     Take these medicines the morning of surgery with A SIP OF WATER   Albuterol inhaler - if needed (bring with you on day of surgery)  Diazepam (Valium) - if needed  Flovent inhaler - if needed  Gabapentin (Neurontin)  Omeprazole (Prilosec)  Prednisone     As of today, STOP taking any Aspirin (unless otherwise instructed by your surgeon) Aleve, Naproxen, Ibuprofen, Motrin, Advil, Goody's, BC's, all herbal medications, fish oil, and all vitamins.                      Do not wear jewelry, make up, or nail polish            Do not wear lotions, powders, perfumes, or deodorant.            Do not shave 48 hours prior to surgery.              Do not bring valuables to the hospital.            [facility] is not responsible for any belongings or valuables.  Do NOT Smoke (Tobacco/Vaping) or drink Alcohol 24 hours prior to your procedure If you use a CPAP at night, you may bring all equipment for your overnight stay.   Contacts, glasses, dentures or bridgework may not be worn into surgery.      For patients admitted to the hospital, discharge time will be  determined by your treatment team.   Patients discharged the day of surgery will not be allowed to drive home, and someone needs to stay with them for 24 hours.    Special instructions:   [place]- Preparing For Surgery  Before surgery, you can play an important role. Because skin is not sterile, your skin needs to be as free of germs as possible. You can reduce the number of germs on your skin by washing with CHG (chlorahexidine gluconate) Soap before surgery.  CHG is an antiseptic cleaner which kills germs and bonds with the skin to continue killing germs even after washing.    Oral Hygiene is also important to reduce your risk of infection.  Remember - BRUSH YOUR TEETH THE MORNING OF SURGERY WITH YOUR REGULAR TOOTHPASTE  Please do not use if you have an allergy to CHG or antibacterial soaps. If your skin becomes reddened/irritated stop using the CHG.  Do not shave (including legs and underarms) for at least 48 hours prior to first CHG shower. It is OK to shave your face.  Please follow these instructions carefully.   1. Shower the NIGHT BEFORE SURGERY and the MORNING OF SURGERY with CHG Soap.  2. If you chose to wash your hair, wash your hair first as usual with your normal shampoo.  3. After you shampoo, rinse your hair and body thoroughly to remove the shampoo.  4. Use CHG as you would any other liquid soap. You can apply CHG directly to the skin and wash gently with a scrungie or a clean washcloth.   5. Apply the CHG Soap to your body ONLY FROM THE NECK DOWN.  Do not use on open wounds or open sores. Avoid contact with your eyes, ears, mouth and genitals (private parts). Wash Face and genitals (private parts)  with your normal soap.   6. Wash thoroughly, paying special attention to the area where your surgery will be performed.  7. Thoroughly rinse your body with warm water from the neck down.  8. DO NOT shower/wash with your normal soap after using and rinsing off the CHG  Soap.  9. Pat yourself dry with a CLEAN TOWEL.  10. Wear CLEAN PAJAMAS to bed the night before surgery  11. Place CLEAN SHEETS on your bed the night of your first shower and DO NOT SLEEP WITH PETS.   Day of Surgery: Wear Clean/Comfortable clothing the morning of surgery Do not apply any deodorants/lotions.   Remember to brush your teeth WITH YOUR REGULAR TOOTHPASTE.   Please read over the following fact sheets that you were given.

## 2020-03-09 ENCOUNTER — Encounter (HOSPITAL_COMMUNITY): Payer: Self-pay

## 2020-03-09 ENCOUNTER — Other Ambulatory Visit: Payer: Self-pay

## 2020-03-09 ENCOUNTER — Other Ambulatory Visit: Payer: Self-pay | Admitting: Otolaryngology

## 2020-03-09 ENCOUNTER — Encounter (HOSPITAL_COMMUNITY)
Admission: RE | Admit: 2020-03-09 | Discharge: 2020-03-09 | Disposition: A | Discharge status: Home / Self Care | Payer: No Typology Code available for payment source | Source: Ambulatory Visit | Attending: Otolaryngology | Admitting: Otolaryngology

## 2020-03-09 ENCOUNTER — Encounter: Payer: Self-pay | Admitting: Family Medicine

## 2020-03-09 DIAGNOSIS — I10 Essential (primary) hypertension: Secondary | ICD-10-CM | POA: Diagnosis not present

## 2020-03-09 DIAGNOSIS — Z01818 Encounter for other preprocedural examination: Secondary | ICD-10-CM | POA: Insufficient documentation

## 2020-03-09 LAB — CBC
HCT: 44.5 % (ref 36.0–46.0)
Hemoglobin: 14.4 g/dL (ref 12.0–15.0)
MCH: 28.5 pg (ref 26.0–34.0)
MCHC: 32.4 g/dL (ref 30.0–36.0)
MCV: 87.9 fL (ref 80.0–100.0)
Platelets: 287 10*3/uL (ref 150–400)
RBC: 5.06 MIL/uL (ref 3.87–5.11)
RDW: 13.1 % (ref 11.5–15.5)
WBC: 8.4 10*3/uL (ref 4.0–10.5)
nRBC: 0 % (ref 0.0–0.2)

## 2020-03-09 LAB — BASIC METABOLIC PANEL
Anion gap: 9 (ref 5–15)
BUN: 12 mg/dL (ref 6–20)
CO2: 24 mmol/L (ref 22–32)
Calcium: 9.4 mg/dL (ref 8.9–10.3)
Chloride: 107 mmol/L (ref 98–111)
Creatinine, Ser: 0.79 mg/dL (ref 0.44–1.00)
GFR, Estimated: >60 mL/min (ref >60)
Glucose, Bld: 125 mg/dL — ABNORMAL HIGH (ref 70–99)
Potassium: 3.4 mmol/L — ABNORMAL LOW (ref 3.5–5.1)
Sodium: 140 mmol/L (ref 135–145)

## 2020-03-09 NOTE — Progress Notes (Signed)
PCP - Duncan Dull  Chest x-ray - 12-13-19 EKG - 03-09-20  COVID TEST- Wednesday 03-11-20   Anesthesia review: n/a  Patient denies shortness of breath, fever, cough and chest pain at PAT appointment   All instructions explained to the patient, with a verbal understanding of the material. Patient agrees to go over the instructions while at home for a better understanding. Patient also instructed to self quarantine after being tested for COVID-19. The opportunity to ask questions was provided.   Patient has implant, and per patient has not had a menstrual cycle in years.   Discuss with anesthesiologist on day of surgery if need pregnancy test.

## 2020-03-09 NOTE — Progress Notes (Signed)
Original consent orders had abbreviations and misspelled wording.  Spoke with Caryn Bee at Dr. Jenne Pane' office in regards to fixing consent, as no abbreviations can be used and wording needs to be corrected.  Per Caryn Bee, need to discontinue all orders so that he is able to put in new orders and new consent for day of surgery.    Unable to get consent signed at PAT appointment.

## 2020-03-10 ENCOUNTER — Ambulatory Visit: Payer: No Typology Code available for payment source | Admitting: Physical Therapy

## 2020-03-10 ENCOUNTER — Other Ambulatory Visit: Payer: Self-pay

## 2020-03-10 ENCOUNTER — Encounter: Payer: Self-pay | Admitting: Physical Therapy

## 2020-03-10 DIAGNOSIS — M5412 Radiculopathy, cervical region: Secondary | ICD-10-CM

## 2020-03-10 DIAGNOSIS — M542 Cervicalgia: Secondary | ICD-10-CM

## 2020-03-10 DIAGNOSIS — R519 Headache, unspecified: Secondary | ICD-10-CM

## 2020-03-10 DIAGNOSIS — M546 Pain in thoracic spine: Secondary | ICD-10-CM

## 2020-03-10 MED ORDER — GABAPENTIN 100 MG PO CAPS: 200.0000 mg | ORAL_CAPSULE | Freq: Every day | ORAL | 3 refills | Status: DC

## 2020-03-10 MED ORDER — FLUTICASONE PROPIONATE 50 MCG/ACT NA SUSP
2.0000 | Freq: Every day | NASAL | 1 refills | Status: DC
Start: 2020-03-10 — End: 2020-03-10

## 2020-03-10 NOTE — Therapy (Signed)
[facility] PHYSICAL AND SPORTS MEDICINE [address], [place], [address] Phone: [phone]   Fax:  [phone]  Physical Therapy Treatment  Patient Details  Name: Lisa [name] MRN: [id number] Date of Birth: [birth year] Referring Provider (PT): Yevette Edwards, MD (pain clinic)   Encounter Date: 03/10/2020   PT End of Session - 03/10/20 2009    Visit Number 2    Number of Visits 24    Date for PT Re-Evaluation 05/27/20    Authorization Type [employer] [employer] reporting period from 03/04/2020    Progress Note Due on Visit 10    PT Start Time 1846    PT Stop Time 1945    PT Time Calculation (min) 59 min    Activity Tolerance Patient tolerated treatment well    Behavior During Therapy [facility] for tasks assessed/performed           Past Medical History:  Diagnosis Date  . Bronchitis    hx of  . Cervical radiculitis 03/03/2020  . Complication of anesthesia   . DDD (degenerative disc disease), cervical 02/28/2020  . Dysrhythmia    "does not see cardiologist"  . Family history of anesthesia complication    "morphine night terrors"  . GERD (gastroesophageal reflux disease)   . Headache(784.0)    "migraines"  . Hypertension   . PONV (postoperative nausea and vomiting)   . Shortness of breath    "wheezing"  . Weakness of left arm 03/03/2020    Past Surgical History:  Procedure Laterality Date  . CESAREAN SECTION     x 2  . MICROLARYNGOSCOPY WITH LASER AND BALLOON DILATION N/A 07/20/2012   Procedure: MICROLARYNGOSCOPY WITH LASER AND BALLOON DILATION;  Surgeon: Christia Reading, MD;  Location: [facility] OR;  Service: ENT;  Laterality: N/A;  WITH JET VENTURI VENTILATION  . TONGUE FLAP RELEASE    . widom extractions      There were no vitals filed for this visit.   Subjective Assessment - 03/10/20 1852    Subjective Patient reports she has a headache, pain in her left chest, and left rib. She noticed a deep breath does hurt in the ribs  and her left hand was bothered when she was trying to read a magazine. Pain is rated 4/10 currently. Did HEP some and it hurt sometimes but not others.    Pertinent History Patient is a 44 y.o. female who presents to outpatient physical therapy with a referral for medical diagnosis cervicalgia, cervical degenerative disc disease. This patient's chief complaints consist of increased neck pain and new left UE pain/weakness/paresthesia and left sided rib pain on top of chronic left neck pain radiating to the left face and headache, leading to the following functional deficits: difficulty using left UE for manual tasks, using the phone, work tasks, holding pressure on wounds (unable), holding things without dropping them, opening packages, pulling back a stopper in a syringe, holding a cup, taking lids off, grooming, folding laundry, making the bed, anything that requires two hands and force, gardening, yardwork, raking, clipping, cutting, dressing, showering, sleeping. Relevant past medical history and comorbidities include diabetes (currently controlled), idiopathic subglottic tracheal stenosis (surgery coming up end of month, causes shortness of breath upon exertion), low back pain on left side, obesity, GERD, when she takes caffeine she gest palpitations, high blood pressure.  Patient denies hx of cancer, stroke, seizures, lung problem (besides trachea problem), major cardiac events, unexplained weight loss, changes in bowel or bladder problems, new  onset stumbling or dropping things apart from described below.    Limitations House hold activities;Other (comment);Lifting   usinghands and force, gardening, yardwork, raking, clipping, cutting, dressing, showering, sleeping.   Diagnostic tests C-spine MRI report 02/20/2020: "IMPRESSION:1. Minimal disc osteophyte complex with left uncovertebral spurringat C5-C6 resulting in mild left foraminal stenosis.2. No canal stenosis at any level."    Patient Stated Goals "to  not have arm pain"    Pain Score 4     Pain Onset More than a month ago           FOTO = 54 (03/10/2020)  TREATMENT:  DOES have latex allergy  Manual therapy: to reduce pain and tissue tension, improve range of motion, neuromodulation, in order to promote improved ability to complete functional activities. Supine position with feet elevated:  - STM to posterior cervical spine from suboccipital region to upper traps and levator scap, focusing on L > R. Trigger point release at lateral upper trap region where it reproduced pain around eye orbit. Decreased pain with softening of tight muscle bands.  - STM to bilateral SCM (tender left) and scalenes (mildly provokes L UE symptoms on left).  - CPA grade III along upper t-spine and over entire c-spine (no reproduction of symptoms).  - Side glides  with side bend grade III over cervical spine bilaterally (minimal reproduction of symptoms except tender at contact point on left side).  - Up glides grade III bilaterally over cervical spine (painful on left at contact point).  - first rib depression grade III L > R with increase in symptoms at anterior contact (discontinued) - MDT manual retraction x2 (worse pain in arm each time) - MDT manual retraction with traction x2, produce, worse (discontinued).  Prone:  - CPA grade III over thoracic and cervical spine. Reproduction of rib pain near T8-T9. Continued CPA grade III-IV over these two levels for 20-30 reps with mild decrease in pain.   Therapeutic exercise:to centralize symptoms and improve ROM, strength, muscular endurance, and activity tolerance required for successful completion of functional activities.  - seated thoracic extension over back of chair: with airex in seat 1x10, 1x4 with clinician overpressure. Without airex in seat 1x10, 1x2 with overpressure.  - seated thoracic extension over edge of plinth while sitting under airex x 5 - seated left radial nerve glide, slider technique, x  15  Education on HEP including handout             PT Education - 03/10/20 2011    Education Details Exercise purpose/form. Self management techniques    Person(s) Educated Patient    Methods Explanation;Demonstration;Tactile cues;Verbal cues;Handout    Comprehension Verbalized understanding;Returned demonstration;Verbal cues required;Tactile cues required;Need further instruction               PT Long Term Goals - 03/05/20 1919      PT LONG TERM GOAL #1   Title Be independent with a long-term home exercise program for self-management of symptoms.    Baseline initial HEP provided at IE (03/04/2020);    Time 12    Period Weeks    Status New   TARGET DATE FOR ALL LONG TERM GOALS: 05/27/2020     PT LONG TERM GOAL #2   Title Demonstrate improved FOTO score by 10 units to demonstrate improvement in overall condition and self-reported functional ability.    Baseline deferred to visit 2 (03/04/2020);    Time 12    Period Weeks    Status New  PT LONG TERM GOAL #3   Title Improve left UE  strength to equal or greater than  4+/5 for improved ability to allow patient to complete valued functional tasks such as lifting, pressing, grasping, holding, etc,  with less difficulty.    Baseline wrist extension 3/5 and reports weakness with repetition (03/05/2020);    Time 12    Period Weeks    Status New      PT LONG TERM GOAL #4   Title Patient will report no longer having pain down the left arm to improve ability to use left UE for functional tasks such as grasping, pressing, stabilizing.    Baseline 8/10 (03/05/2020);    Time 12    Period Weeks    Status New      PT LONG TERM GOAL #5   Title Complete community, work and/or recreational activities without limitation due to current condition.    Baseline Functional Limitations: using left UE for manual tasks, using the phone, work tasks, holding pressure on wounds (unable), holding things without dropping them, opening  packages, pulling back a stopper in a syringe, holding a cup, taking lids off, grooming, folding laundry, making the bed, anything that requires two hands and force, gardening, yardwork, raking, clipping, cutting, dressing, showering, sleeping (03/04/2020);    Time 12    Period Weeks    Status New                 Plan - 03/10/20 2009    Clinical Impression Statement Patient tolerated treatment well overall and reported significant decrease in rib and thoracic pain following manual therapy to that region and repeated thoracic extension. Updated HEP to include this and educated on how to use at home. Patient appears to have extension preference at the thoracic spine but was unable to tolerate extension principle at the cervical or upper thoracic region. Did not have a strong response to manual traction. STM to left upper trap produced and then decreased headache. Patient had intermittent paresthesia and pain in left UE throughout session. Will continue to assess for interventions to improve this area. Patient would benefit from continued management of limiting condition by skilled physical therapist to address remaining impairments and functional limitations to work towards stated goals and return to PLOF or maximal functional independence.    Personal Factors and Comorbidities Age;Comorbidity 3+;Past/Current Experience;Fitness;Time since onset of injury/illness/exacerbation;Profession;Education    Comorbidities Relevant past medical history and comorbidities include diabetes (currently controlled), idiopathic subglottic tracheal stenosis (surgery coming up end of month, causes shortness of breath upon exertion), low back pain on left side, obesity, GERD, when she takes caffeine she gest palpitations, high blood pressure.    Examination-Activity Limitations Bathing;Hygiene/Grooming;Lift;Caring for Others;Reach Overhead;Carry;Dressing;Sleep    Examination-Participation Restrictions Community  Activity;Occupation;Yard Work;Meal Prep;Laundry;Cleaning;Interpersonal Relationship;Other   using left UE for manual tasks, using the phone, holding pressure on wounds (unable), holding things without dropping them, opening packages, pulling back a stopper in a syringe, holding a cup, taking lids off, making the bed   Stability/Clinical Decision Making Evolving/Moderate complexity    Rehab Potential Good    PT Frequency 2x / week    PT Duration 12 weeks    PT Treatment/Interventions Moist Heat;Therapeutic exercise;Manual techniques;Dry needling;Electrical Stimulation;ADLs/Self Care Home Management;Cryotherapy;Traction;Therapeutic activities;Neuromuscular re-education;Patient/family education;Passive range of motion;Taping;Joint Manipulations;Spinal Manipulations    PT Next Visit Plan add to HEP, manual cervical traction, repeated motions testing, neuro dynamics    PT Home Exercise Plan radial nerve glide, repeated thoracic extension  Consulted and Agree with Plan of Care Patient           Patient will benefit from skilled therapeutic intervention in order to improve the following deficits and impairments:  Pain, Decreased activity tolerance, Impaired sensation, Improper body mechanics, Postural dysfunction, Increased muscle spasms, Decreased coordination, Impaired tone, Decreased endurance, Decreased range of motion, Decreased strength, Impaired perceived functional ability, Obesity, Impaired UE functional use  Visit Diagnosis: Radiculopathy, cervical region  Cervicalgia  Nonintractable headache, unspecified chronicity pattern, unspecified headache type  Pain in thoracic spine     Problem List Patient Active Problem List   Diagnosis Date Noted  . Cervical radiculitis 03/03/2020  . Weakness of left arm 03/03/2020  . Cervicalgia 02/28/2020  . Spondylosis, cervical, 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  . Acute URI 12/04/2019  . Acute maxillary sinusitis 12/04/2019  . Loud snoring  08/01/2019  . B12 deficiency 08/01/2019  . Eczema of left external ear 08/01/2019  . Hyperlipidemia associated with type 2 diabetes mellitus (HCC) 07/31/2019  . Nonallopathic lesion of cervical region 04/29/2019  . Nonallopathic lesion of thoracic region 04/29/2019  . Nonallopathic lesion of rib cage 04/29/2019  . Cervical radiculopathy at C7 03/26/2019  . Abnormal breast exam 01/24/2019  . Elevated ALT measurement 01/24/2019  . Allergic rhinitis due to pollen 08/18/2018  . Abnormal mammogram of right breast 02/27/2018  . Dermatitis 05/02/2017  . Type 2 diabetes mellitus without complications (HCC) 05/02/2017  . Idiopathic subglottic tracheal stenosis 06/21/2016  . Encounter for general adult medical examination with abnormal findings 04/29/2016  . Low back pain without sciatica 04/28/2015  . Class 3 severe obesity without serious comorbidity with body mass index (BMI) of 40.0 to 44.9 in adult (HCC) 04/28/2015  . Pre-syncope 07/14/2014    Luretha Murphy. Ilsa Iha, PT, DPT 03/10/20, 8:12 PM  [place] [facility] PHYSICAL AND SPORTS MEDICINE [address], [place], [address] Phone: [phone]   Fax:  [phone]  Name: ALAYSSA FLINCHUM MRN: [id number] Date of Birth: 1976-02-27

## 2020-03-10 NOTE — Telephone Encounter (Signed)
Medication has been refilled.

## 2020-03-11 ENCOUNTER — Other Ambulatory Visit: Payer: Self-pay

## 2020-03-11 ENCOUNTER — Other Ambulatory Visit
Admission: RE | Admit: 2020-03-11 | Discharge: 2020-03-11 | Disposition: A | Discharge status: Home / Self Care | Payer: No Typology Code available for payment source | Source: Ambulatory Visit | Attending: Otolaryngology | Admitting: Otolaryngology

## 2020-03-11 DIAGNOSIS — Z01812 Encounter for preprocedural laboratory examination: Secondary | ICD-10-CM | POA: Diagnosis present

## 2020-03-11 DIAGNOSIS — Z20822 Contact with and (suspected) exposure to covid-19: Secondary | ICD-10-CM | POA: Insufficient documentation

## 2020-03-11 LAB — SARS CORONAVIRUS 2 (TAT 6-24 HRS): SARS Coronavirus 2: NEGATIVE

## 2020-03-12 ENCOUNTER — Ambulatory Visit: Payer: No Typology Code available for payment source | Admitting: Physical Therapy

## 2020-03-12 ENCOUNTER — Encounter: Payer: Self-pay | Admitting: Physical Therapy

## 2020-03-12 DIAGNOSIS — M542 Cervicalgia: Secondary | ICD-10-CM

## 2020-03-12 DIAGNOSIS — M5412 Radiculopathy, cervical region: Secondary | ICD-10-CM | POA: Diagnosis not present

## 2020-03-12 DIAGNOSIS — R519 Headache, unspecified: Secondary | ICD-10-CM

## 2020-03-12 DIAGNOSIS — M546 Pain in thoracic spine: Secondary | ICD-10-CM

## 2020-03-12 MED ORDER — DEXTROSE 5 % IV SOLN
3.0000 g | INTRAVENOUS | Status: AC
  Administered 2020-03-13: 3 g via INTRAVENOUS
  Filled 2020-03-12: qty 3
  Filled 2020-03-12: qty 3000

## 2020-03-12 NOTE — Therapy (Signed)
[facility] PHYSICAL AND SPORTS MEDICINE [address], [place], [address] Phone: [phone]   Fax:  [phone]  Physical Therapy Treatment  Patient Details  Name: Lisa [name] MRN: [id number] Date of Birth: 1975/12/21 Referring Provider (PT): Yevette Edwards, MD (pain clinic)   Encounter Date: 03/12/2020   PT End of Session - 03/12/20 1915    Visit Number 3    Number of Visits 24    Date for PT Re-Evaluation 05/27/20    Authorization Type [employer] reporting period from 03/04/2020    Progress Note Due on Visit 10    PT Start Time 1650    PT Stop Time 1730    PT Time Calculation (min) 40 min    Activity Tolerance Patient tolerated treatment well    Behavior During Therapy [facility] for tasks assessed/performed           Past Medical History:  Diagnosis Date  . Bronchitis    hx of  . Cervical radiculitis 03/03/2020  . Complication of anesthesia   . DDD (degenerative disc disease), cervical 02/28/2020  . Dysrhythmia    "does not see cardiologist"  . Family history of anesthesia complication    "morphine night terrors"  . GERD (gastroesophageal reflux disease)   . Headache(784.0)    "migraines"  . Hypertension   . PONV (postoperative nausea and vomiting)   . Shortness of breath    "wheezing"  . Weakness of left arm 03/03/2020    Past Surgical History:  Procedure Laterality Date  . CESAREAN SECTION     x 2  . MICROLARYNGOSCOPY WITH LASER AND BALLOON DILATION N/A 07/20/2012   Procedure: MICROLARYNGOSCOPY WITH LASER AND BALLOON DILATION;  Surgeon: Christia Reading, MD;  Location: [facility] OR;  Service: ENT;  Laterality: N/A;  WITH JET VENTURI VENTILATION  . TONGUE FLAP RELEASE    . widom extractions      There were no vitals filed for this visit.   Subjective Assessment - 03/12/20 1649    Subjective Patient reports she felt great when she left here and went to sleep well but then woke up in horrible pain in her head and  neck and felt nausea at about 6 am. Used ice and muscle relaxant and was able to go back to sleep. Had a light day at work. She was scared to do the stretches yesterday because she was hurting so bad that morning. She has not had neck and arm pain as bad as before. The arm pain was worse yesterday. Today's work was pretty light today as well. Rib pain did seem to remain better. Pain she had in her head was at base of scull and top of her head, pretty symmetrical. No extra pain in her left eye orbit.  Current pain is 3/10 after taking tylenol (took flu shot today), and also had some caffeinee. Pain is in left side of neck and base of scull and a little in top of head. Currently pain down to forearm. Rib is is maybe 2/10.    Pertinent History Patient is a 44 y.o. female who presents to outpatient physical therapy with a referral for medical diagnosis cervicalgia, cervical degenerative disc disease. This patient's chief complaints consist of increased neck pain and new left UE pain/weakness/paresthesia and left sided rib pain on top of chronic left neck pain radiating to the left face and headache, leading to the following functional deficits: difficulty using left UE for manual tasks, using the  phone, work tasks, holding pressure on wounds (unable), holding things without dropping them, opening packages, pulling back a stopper in a syringe, holding a cup, taking lids off, grooming, folding laundry, making the bed, anything that requires two hands and force, gardening, yardwork, raking, clipping, cutting, dressing, showering, sleeping. Relevant past medical history and comorbidities include diabetes (currently controlled), idiopathic subglottic tracheal stenosis (surgery coming up end of month, causes shortness of breath upon exertion), low back pain on left side, obesity, GERD, when she takes caffeine she gest palpitations, high blood pressure.  Patient denies hx of cancer, stroke, seizures, lung problem (besides  trachea problem), major cardiac events, unexplained weight loss, changes in bowel or bladder problems, new onset stumbling or dropping things apart from described below.    Limitations House hold activities;Other (comment);Lifting   usinghands and force, gardening, yardwork, raking, clipping, cutting, dressing, showering, sleeping.   Diagnostic tests C-spine MRI report 02/20/2020: "IMPRESSION:1. Minimal disc osteophyte complex with left uncovertebral spurringat C5-C6 resulting in mild left foraminal stenosis.2. No canal stenosis at any level."    Patient Stated Goals "to not have arm pain"    Pain Score 3     Pain Onset More than a month ago           TREATMENT: DOES have latex allergy  Therapeutic exercise:to centralize symptoms and improve ROM, strength, muscular endurance, and activity tolerance required for successful completion of functional activities. - seated thoracic extension over back of chair: with airex in seat 1x10.  - seated cervical spine AROM (more limited in left rotation) - seated cervical spine rotation to the left x 10 (no effect, temporary eye pain upon release).  - seated cervical spine flexion 2x 10, pull in back of neck, no worse. Pain abolished in left hand, but dull headache.  - Education on HEP including handout   Manual therapy: to reduce pain and tissue tension, improve range of motion, neuromodulation, in order to promote improved ability to complete functional activities.  Supine position with feet elevated:  - STM to posterior cervical spine from suboccipital region to upper traps and levator scap, focusing on L > R.   HOME EXERCISE PROGRAM Access Code: [email or web address] URL: [email or web address] Date: 03/12/2020 Prepared by: Norton BlizzardSara Arina Torry  Exercises Seated Cervical Flexion AROM - 6 x daily - 1 sets - 10 reps          PT Education - 03/12/20 1915    Education Details Exercise purpose/form. Self management techniques    Person(s)  Educated Patient    Methods Explanation;Demonstration;Tactile cues;Verbal cues;Handout    Comprehension Verbalized understanding;Returned demonstration;Verbal cues required;Tactile cues required;Need further instruction               PT Long Term Goals - 03/05/20 1919      PT LONG TERM GOAL #1   Title Be independent with a long-term home exercise program for self-management of symptoms.    Baseline initial HEP provided at IE (03/04/2020);    Time 12    Period Weeks    Status New   TARGET DATE FOR ALL LONG TERM GOALS: 05/27/2020     PT LONG TERM GOAL #2   Title Demonstrate improved FOTO score by 10 units to demonstrate improvement in overall condition and self-reported functional ability.    Baseline deferred to visit 2 (03/04/2020);    Time 12    Period Weeks    Status New      PT LONG TERM GOAL #3   Title Improve left UE  strength to equal or greater than  4+/5 for improved ability to allow patient to complete valued functional tasks such as lifting, pressing, grasping, holding, etc,  with less difficulty.    Baseline wrist extension 3/5 and reports weakness with repetition (03/05/2020);    Time 12    Period Weeks    Status New      PT LONG TERM GOAL #4   Title Patient will report no longer having pain down the left arm to improve ability to use left UE for functional tasks such as grasping, pressing, stabilizing.    Baseline 8/10 (03/05/2020);    Time 12    Period Weeks    Status New      PT LONG TERM GOAL #5   Title Complete community, work and/or recreational activities without limitation due to current condition.    Baseline Functional Limitations: using left UE for manual tasks, using the phone, work tasks, holding pressure on wounds (unable), holding things without dropping them, opening packages, pulling back a stopper in a syringe, holding a cup, taking lids off, grooming, folding laundry, making the bed, anything that requires two hands and force, gardening,  yardwork, raking, clipping, cutting, dressing, showering, sleeping (03/04/2020);    Time 12    Period Weeks    Status New                 Plan - 03/12/20 1918    Clinical Impression Statement Patient with good response to last session at first but then increased pain more than a while during the night. Took more conservative approach with manual therapy today. Patient appears to be responding well to extension at thoracic spine and possibly to flexion at cervical spine. Updated HEP to reflect this. Patient would benefit from continued management of limiting condition by skilled physical therapist to address remaining impairments and functional limitations to work towards stated goals and return to PLOF or maximal functional independence.    Personal Factors and Comorbidities Age;Comorbidity 3+;Past/Current Experience;Fitness;Time since onset of injury/illness/exacerbation;Profession;Education    Comorbidities Relevant past medical history and comorbidities include diabetes (currently controlled), idiopathic subglottic tracheal stenosis (surgery coming up end of month, causes shortness of breath upon exertion), low back pain on left side, obesity, GERD, when she takes caffeine she gest palpitations, high blood pressure.    Examination-Activity Limitations Bathing;Hygiene/Grooming;Lift;Caring for Others;Reach Overhead;Carry;Dressing;Sleep    Examination-Participation Restrictions Community Activity;Occupation;Yard Work;Meal Prep;Laundry;Cleaning;Interpersonal Relationship;Other   using left UE for manual tasks, using the phone, holding pressure on wounds (unable), holding things without dropping them, opening packages, pulling back a stopper in a syringe, holding a cup, taking lids off, making the bed   Stability/Clinical Decision Making Evolving/Moderate complexity    Rehab Potential Good    PT Frequency 2x / week    PT Duration 12 weeks    PT Treatment/Interventions Moist Heat;Therapeutic  exercise;Manual techniques;Dry needling;Electrical Stimulation;ADLs/Self Care Home Management;Cryotherapy;Traction;Therapeutic activities;Neuromuscular re-education;Patient/family education;Passive range of motion;Taping;Joint Manipulations;Spinal Manipulations    PT Next Visit Plan add to HEP, manual cervical traction, repeated motions testing, neuro dynamics    PT Home Exercise Plan radial nerve glide, repeated thoracic extension    Consulted and Agree with Plan of Care Patient           Patient will benefit from skilled therapeutic intervention in order to improve the following deficits and impairments:  Pain, Decreased activity tolerance, Impaired sensation, Improper body mechanics, Postural dysfunction, Increased muscle spasms, Decreased coordination, Impaired tone, Decreased endurance, Decreased range of motion, Decreased strength, Impaired  perceived functional ability, Obesity, Impaired UE functional use  Visit Diagnosis: Radiculopathy, cervical region  Cervicalgia  Nonintractable headache, unspecified chronicity pattern, unspecified headache type  Pain in thoracic spine     Problem List Patient Active Problem List   Diagnosis Date Noted  . Cervical radiculitis 03/03/2020  . Weakness of left arm 03/03/2020  . Cervicalgia 02/28/2020  . Spondylosis, cervical, 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  . Acute URI 12/04/2019  . Acute maxillary sinusitis 12/04/2019  . Loud snoring 08/01/2019  . B12 deficiency 08/01/2019  . Eczema of left external ear 08/01/2019  . Hyperlipidemia associated with type 2 diabetes mellitus (HCC) 07/31/2019  . Nonallopathic lesion of cervical region 04/29/2019  . Nonallopathic lesion of thoracic region 04/29/2019  . Nonallopathic lesion of rib cage 04/29/2019  . Cervical radiculopathy at C7 03/26/2019  . Abnormal breast exam 01/24/2019  . Elevated ALT measurement 01/24/2019  . Allergic rhinitis due to pollen 08/18/2018  . Abnormal mammogram of right breast  02/27/2018  . Dermatitis 05/02/2017  . Type 2 diabetes mellitus without complications (HCC) 05/02/2017  . Idiopathic subglottic tracheal stenosis 06/21/2016  . Encounter for general adult medical examination with abnormal findings 04/29/2016  . Low back pain without sciatica 04/28/2015  . Class 3 severe obesity without serious comorbidity with body mass index (BMI) of 40.0 to 44.9 in adult (HCC) 04/28/2015  . Pre-syncope 07/14/2014    Luretha Murphy. Ilsa Iha, PT, DPT 03/12/20, 7:18 PM  [facility] PHYSICAL AND SPORTS MEDICINE [address] [place], [address] Phone: [phone]   Fax:  [phone]  Name: CALAH GERSHMAN MRN: [id number] Date of Birth: 15-May-1976

## 2020-03-13 ENCOUNTER — Ambulatory Visit (HOSPITAL_COMMUNITY)
Admission: RE | Admit: 2020-03-13 | Discharge: 2020-03-13 | Disposition: A | Discharge status: Home / Self Care | Payer: No Typology Code available for payment source | Source: Ambulatory Visit | Attending: Otolaryngology | Admitting: Otolaryngology

## 2020-03-13 ENCOUNTER — Ambulatory Visit (HOSPITAL_COMMUNITY): Payer: No Typology Code available for payment source | Admitting: Anesthesiology

## 2020-03-13 ENCOUNTER — Encounter (HOSPITAL_COMMUNITY): Payer: Self-pay | Admitting: Otolaryngology

## 2020-03-13 ENCOUNTER — Encounter (HOSPITAL_COMMUNITY)
Admission: RE | Disposition: A | Discharge status: Home / Self Care | Payer: Self-pay | Source: Ambulatory Visit | Attending: Otolaryngology

## 2020-03-13 ENCOUNTER — Other Ambulatory Visit: Payer: Self-pay

## 2020-03-13 DIAGNOSIS — J386 Stenosis of larynx: Secondary | ICD-10-CM | POA: Insufficient documentation

## 2020-03-13 HISTORY — PX: MICROLARYNGOSCOPY WITH DILATION: SHX5971

## 2020-03-13 LAB — POCT PREGNANCY, URINE: Preg Test, Ur: NEGATIVE

## 2020-03-13 LAB — GLUCOSE, CAPILLARY: Glucose-Capillary: 115 mg/dL — ABNORMAL HIGH (ref 70–99)

## 2020-03-13 SURGERY — MICROLARYNGOSCOPY WITH DILATION
Anesthesia: General

## 2020-03-13 MED ORDER — DEXAMETHASONE SODIUM PHOSPHATE 10 MG/ML IJ SOLN
INTRAMUSCULAR | Status: DC | PRN
  Administered 2020-03-13: 10 mg via INTRAVENOUS

## 2020-03-13 MED ORDER — EPINEPHRINE HCL (NASAL) 0.1 % NA SOLN
NASAL | Status: AC
  Filled 2020-03-13: qty 30

## 2020-03-13 MED ORDER — LACTATED RINGERS IV SOLN: INTRAVENOUS | Status: DC

## 2020-03-13 MED ORDER — OXYCODONE HCL 5 MG/5ML PO SOLN: 5.0000 mg | Freq: Once | ORAL | Status: DC | PRN

## 2020-03-13 MED ORDER — PROPOFOL 500 MG/50ML IV EMUL
INTRAVENOUS | Status: DC | PRN
  Administered 2020-03-13 (×2): 200 ug/kg/min via INTRAVENOUS

## 2020-03-13 MED ORDER — CHLORHEXIDINE GLUCONATE 0.12 % MT SOLN
15.0000 mL | Freq: Once | OROMUCOSAL | Status: AC
  Administered 2020-03-13: 15 mL via OROMUCOSAL
  Filled 2020-03-13: qty 15

## 2020-03-13 MED ORDER — 0.9 % SODIUM CHLORIDE (POUR BTL) OPTIME
TOPICAL | Status: DC | PRN
  Administered 2020-03-13: 1000 mL

## 2020-03-13 MED ORDER — DEXAMETHASONE SODIUM PHOSPHATE 10 MG/ML IJ SOLN
INTRAMUSCULAR | Status: AC
  Filled 2020-03-13: qty 2

## 2020-03-13 MED ORDER — PROPOFOL 10 MG/ML IV BOLUS
INTRAVENOUS | Status: AC
  Filled 2020-03-13: qty 20

## 2020-03-13 MED ORDER — SUGAMMADEX SODIUM 200 MG/2ML IV SOLN
INTRAVENOUS | Status: DC | PRN
  Administered 2020-03-13: 300 mg via INTRAVENOUS

## 2020-03-13 MED ORDER — MIDAZOLAM HCL 2 MG/2ML IJ SOLN
INTRAMUSCULAR | Status: DC | PRN
  Administered 2020-03-13: 2 mg via INTRAVENOUS

## 2020-03-13 MED ORDER — FENTANYL CITRATE (PF) 250 MCG/5ML IJ SOLN
INTRAMUSCULAR | Status: AC
  Filled 2020-03-13: qty 5

## 2020-03-13 MED ORDER — ORAL CARE MOUTH RINSE: 15.0000 mL | Freq: Once | OROMUCOSAL | Status: AC

## 2020-03-13 MED ORDER — LIDOCAINE 2% (20 MG/ML) 5 ML SYRINGE
INTRAMUSCULAR | Status: AC
  Filled 2020-03-13: qty 15

## 2020-03-13 MED ORDER — FENTANYL CITRATE (PF) 250 MCG/5ML IJ SOLN
INTRAMUSCULAR | Status: DC | PRN
  Administered 2020-03-13: 50 ug via INTRAVENOUS
  Administered 2020-03-13: 100 ug via INTRAVENOUS

## 2020-03-13 MED ORDER — DIPHENHYDRAMINE HCL 50 MG/ML IJ SOLN
INTRAMUSCULAR | Status: AC
  Filled 2020-03-13: qty 1

## 2020-03-13 MED ORDER — FENTANYL CITRATE (PF) 100 MCG/2ML IJ SOLN
25.0000 ug | INTRAMUSCULAR | Status: DC | PRN
  Administered 2020-03-13: 25 ug via INTRAVENOUS
  Administered 2020-03-13: 50 ug via INTRAVENOUS
  Administered 2020-03-13: 25 ug via INTRAVENOUS

## 2020-03-13 MED ORDER — OXYCODONE HCL 5 MG PO TABS: 5.0000 mg | ORAL_TABLET | Freq: Once | ORAL | Status: DC | PRN

## 2020-03-13 MED ORDER — ALBUTEROL SULFATE HFA 108 (90 BASE) MCG/ACT IN AERS
INHALATION_SPRAY | RESPIRATORY_TRACT | Status: DC | PRN
  Administered 2020-03-13 (×2): 6 via RESPIRATORY_TRACT

## 2020-03-13 MED ORDER — ROCURONIUM BROMIDE 10 MG/ML (PF) SYRINGE
PREFILLED_SYRINGE | INTRAVENOUS | Status: AC
  Filled 2020-03-13: qty 10

## 2020-03-13 MED ORDER — PROMETHAZINE HCL 25 MG/ML IJ SOLN: 6.2500 mg | INTRAMUSCULAR | Status: DC | PRN

## 2020-03-13 MED ORDER — DIPHENHYDRAMINE HCL 50 MG/ML IJ SOLN
6.2500 mg | Freq: Once | INTRAMUSCULAR | Status: AC
  Administered 2020-03-13: 6.25 mg via INTRAVENOUS

## 2020-03-13 MED ORDER — ONDANSETRON HCL 4 MG/2ML IJ SOLN
INTRAMUSCULAR | Status: DC | PRN
  Administered 2020-03-13: 4 mg via INTRAVENOUS

## 2020-03-13 MED ORDER — PROPOFOL 10 MG/ML IV BOLUS
INTRAVENOUS | Status: DC | PRN
  Administered 2020-03-13: 160 mg via INTRAVENOUS

## 2020-03-13 MED ORDER — LIDOCAINE 2% (20 MG/ML) 5 ML SYRINGE
INTRAMUSCULAR | Status: DC | PRN
  Administered 2020-03-13: 100 mg via INTRAVENOUS

## 2020-03-13 MED ORDER — ROCURONIUM BROMIDE 10 MG/ML (PF) SYRINGE
PREFILLED_SYRINGE | INTRAVENOUS | Status: DC | PRN
  Administered 2020-03-13: 60 mg via INTRAVENOUS

## 2020-03-13 MED ORDER — FENTANYL CITRATE (PF) 100 MCG/2ML IJ SOLN
INTRAMUSCULAR | Status: AC
  Filled 2020-03-13: qty 2

## 2020-03-13 MED ORDER — MITOMYCIN-C INJECTION USE IN OR ONLY (0.4 MG/ML)
0.5000 mL | Freq: Once | INTRAVENOUS | Status: AC
  Administered 2020-03-13: 0.5 mL
  Filled 2020-03-13 (×2): qty 0.5

## 2020-03-13 MED ORDER — MIDAZOLAM HCL 2 MG/2ML IJ SOLN
INTRAMUSCULAR | Status: AC
  Filled 2020-03-13: qty 2

## 2020-03-13 MED ORDER — ONDANSETRON HCL 4 MG/2ML IJ SOLN
INTRAMUSCULAR | Status: AC
  Filled 2020-03-13: qty 4

## 2020-03-13 MED ORDER — PROPOFOL 1000 MG/100ML IV EMUL
INTRAVENOUS | Status: AC
  Filled 2020-03-13: qty 100

## 2020-03-13 SURGICAL SUPPLY — 24 items
BALLN PULM 15 16.5 18 X 75CM (BALLOONS) ×1
BALLN PULM 15 16.5 18X75 (BALLOONS) ×2
BALLOON PULM 15 16.5 18X75 (BALLOONS) ×1 IMPLANT
BNDG EYE OVAL (GAUZE/BANDAGES/DRESSINGS) ×6 IMPLANT
CANISTER SUCT 3000ML PPV (MISCELLANEOUS) ×3 IMPLANT
COVER BACK TABLE 60X90IN (DRAPES) ×3 IMPLANT
DRAPE HALF SHEET 40X57 (DRAPES) ×3 IMPLANT
GLOVE SURG SS PI 7.5 STRL IVOR (GLOVE) ×3 IMPLANT
GOWN STRL REUS W/ TWL LRG LVL3 (GOWN DISPOSABLE) ×2 IMPLANT
GOWN STRL REUS W/TWL LRG LVL3 (GOWN DISPOSABLE) ×6
KIT BASIN OR (CUSTOM PROCEDURE TRAY) ×3 IMPLANT
KIT TURNOVER KIT B (KITS) ×3 IMPLANT
PAD ARMBOARD 7.5X6 YLW CONV (MISCELLANEOUS) ×3 IMPLANT
PATTIES SURGICAL .5 X1 (DISPOSABLE) ×3 IMPLANT
PATTIES SURGICAL .5 X3 (DISPOSABLE) ×3 IMPLANT
POSITIONER HEAD DONUT 9IN (MISCELLANEOUS) ×3 IMPLANT
SOL ANTI FOG 6CC (MISCELLANEOUS) ×1 IMPLANT
SOLUTION ANTI FOG 6CC (MISCELLANEOUS) ×2
SUT SILK 2 0 SH (SUTURE) ×3 IMPLANT
SYR GAUGE ALLIANCE SINGLE USE (MISCELLANEOUS) ×3 IMPLANT
TOWEL GREEN STERILE FF (TOWEL DISPOSABLE) ×6 IMPLANT
TUBE CONNECTING 12'X1/4 (SUCTIONS) ×1
TUBE CONNECTING 12X1/4 (SUCTIONS) ×2 IMPLANT
WATER STERILE IRR 1000ML POUR (IV SOLUTION) ×3 IMPLANT

## 2020-03-13 NOTE — Op Note (Signed)
.  PREOPERATIVE DIAGNOSIS:  Subglottic stenosis   POSTOPERATIVE DIAGNOSIS:  Subglottic stenosis   PROCEDURE:  Suspended microdirect laryngoscopy with CO2 laser dilation and application of mitomycin C   SURGEON:  Melida Quitter, MD   ANESTHESIA:  General with jet ventilation by anesthesia.   COMPLICATIONS:  None.   INDICATIONS:  The patient is a 44 year old female with a history of idiopathic subglottic stenosis who was last dilated in 2014 but has had gradual recurrence of obstructive breathing.  She presents to the operating room for surgical management.   FINDINGS:  20% subglottic stenosis more from anterior and right side   DESCRIPTION OF PROCEDURE:  The patient was identified in the holding room, informed consent having been obtained including discussion of risks, benefits and alternatives, the patient was brought to the operative suite and put the operative table in  supine position.  Anesthesia was induced and the patient was maintained via mask ventilation.  The eyes were taped closed and bed was turned 90 degrees from anesthesia.  The patient was given intravenous steroids during the  case.  A tooth guard was placed over the upper teeth and a Stortz laryngoscope was placed into the supraglottic position and suspended to Mayo stand using the Lewy arm.  Jet ventilation was initiated.  Damp eye pads were taped over the eyes and a damp towel was placed over the face.  Photodocumentation was performed with the zero degree telescope.  The operating microscope was brought into view and the CO2 laser was used on a setting of 4 watts to make radial incisions at 12 o'clock and 2 o'clock. The large tracheal dilation balloon was then inserted and inflated to 7 atm for 60 seconds.  The patient's oxygen saturation fell into the upper 70s so a small endotracheal tube was placed to allow better gas exchange and her saturation raised into the 90s again.  The tube was removed and a pledget wrapped around a  suction tubing segment soaked in mitomycin C at a concentration of 0.4 mg/ml, 0.5 cc, was placed in the subglottic airway and the patient ventilated for 2 minutes.  The pledget was removed.  Photodocumentation was repeated.  The airway was suctioned.  The laryngoscope was then taking out of suspension and removed from the patient's mouth while suctioning the airway.  The tooth guard was removed and the patient was turned back to anesthesia for wakeup and taken to the recovery room in stable condition.

## 2020-03-13 NOTE — Anesthesia Preprocedure Evaluation (Signed)
Anesthesia Evaluation  Patient identified by MRN, date of birth, ID band Patient awake    Reviewed: Allergy & Precautions, NPO status , Patient's Chart, lab work & pertinent test results  History of Anesthesia Complications (+) PONV  Airway Mallampati: II  TM Distance: >3 FB Neck ROM: Full   Comment: Subglottic stenosis Dental no notable dental hx.    Pulmonary neg pulmonary ROS,    Pulmonary exam normal breath sounds clear to auscultation       Cardiovascular hypertension, Normal cardiovascular exam Rhythm:Regular Rate:Normal     Neuro/Psych negative neurological ROS  negative psych ROS   GI/Hepatic Neg liver ROS, GERD  ,  Endo/Other  diabetesMorbid obesity  Renal/GU negative Renal ROS  negative genitourinary   Musculoskeletal negative musculoskeletal ROS (+)   Abdominal   Peds negative pediatric ROS (+)  Hematology negative hematology ROS (+)   Anesthesia Other Findings   Reproductive/Obstetrics negative OB ROS                             Anesthesia Physical Anesthesia Plan  ASA: III  Anesthesia Plan: General   Post-op Pain Management:    Induction: Intravenous  PONV Risk Score and Plan: 4 or greater and Ondansetron, Dexamethasone, Droperidol, Midazolam and Treatment may vary due to age or medical condition  Airway Management Planned:   Additional Equipment:   Intra-op Plan:   Post-operative Plan: Extubation in OR  Informed Consent: I have reviewed the patients History and Physical, chart, labs and discussed the procedure including the risks, benefits and alternatives for the proposed anesthesia with the patient or authorized representative who has indicated his/her understanding and acceptance.     Dental advisory given  Plan Discussed with: CRNA  Anesthesia Plan Comments: (Jet ventilation)        Anesthesia Quick Evaluation

## 2020-03-13 NOTE — Anesthesia Postprocedure Evaluation (Signed)
Anesthesia Post Note  Patient: Lisa [name]  Procedure(s) Performed: MICROLARYNGOSCOPY WITH DILATION w/JET VENT MITOMYCIN C (N/A )     Patient location during evaluation: PACU Anesthesia Type: General Level of consciousness: awake and alert Pain management: pain level controlled Vital Signs Assessment: post-procedure vital signs reviewed and stable Respiratory status: spontaneous breathing, nonlabored ventilation, respiratory function stable and patient connected to nasal cannula oxygen Cardiovascular status: blood pressure returned to baseline and stable Postop Assessment: no apparent nausea or vomiting Anesthetic complications: no   No complications documented.  Last Vitals:  Vitals:   03/13/20 1315 03/13/20 1330  BP: 127/80 125/79  Pulse: (!) 56 76  Resp: 15 14  Temp:  (!) 36.3 C  SpO2: 97% 99%    Last Pain:  Vitals:   03/13/20 1215  TempSrc:   PainSc: 10-Worst pain ever                 Emry Barbato S

## 2020-03-13 NOTE — H&P (Signed)
Lisa [name] is an 44 y.o. female.   Chief Complaint: Subglottic stenosis HPI: 44 year old female with subglottic stenosis last dilated in 2014.  She has had gradual recurrence of obstructive breathing symptoms and presents for repeat dilation.  Past Medical History:  Diagnosis Date  . Bronchitis    hx of  . Cervical radiculitis 03/03/2020  . Complication of anesthesia   . DDD (degenerative disc disease), cervical 02/28/2020  . Dysrhythmia    "does not see cardiologist"  . Family history of anesthesia complication    "morphine night terrors"  . GERD (gastroesophageal reflux disease)   . Headache(784.0)    "migraines"  . Hypertension   . PONV (postoperative nausea and vomiting)   . Shortness of breath    "wheezing"  . Weakness of left arm 03/03/2020    Past Surgical History:  Procedure Laterality Date  . CESAREAN SECTION     x 2  . MICROLARYNGOSCOPY WITH LASER AND BALLOON DILATION N/A 07/20/2012   Procedure: MICROLARYNGOSCOPY WITH LASER AND BALLOON DILATION;  Surgeon: Christia Reading, MD;  Location: [facility] OR;  Service: ENT;  Laterality: N/A;  WITH JET VENTURI VENTILATION  . TONGUE FLAP RELEASE    . widom extractions      Family History  Problem Relation Age of Onset  . Hypertension Mother   . Hyperlipidemia Mother   . Arthritis Mother   . Hypertension Father   . Hyperlipidemia Father   . Breast cancer Neg Hx    Social History:  reports that she has never smoked. She has never used smokeless tobacco. She reports current alcohol use. She reports that she does not use drugs.  Allergies:  Allergies  Allergen Reactions  . Latex Hives    Rash and hives    Medications Prior to Admission  Medication Sig Dispense Refill  . acetaminophen (TYLENOL) 500 MG tablet Take 1,000 mg by mouth every 6 (six) hours as needed.    . cetirizine (ZYRTEC) 10 MG tablet Take 10 mg by mouth 2 (two) times daily.     . cholecalciferol (VITAMIN D3) 25 MCG (1000 UNIT) tablet Take 1,000 Units by  mouth daily.    . cyclobenzaprine (FLEXERIL) 5 MG tablet Take 1 tablet (5 mg total) by mouth 3 (three) times daily as needed. (Patient taking differently: Take 5 mg by mouth 3 (three) times daily as needed for muscle spasms. ) 30 tablet 0  . DENAVIR 1 % cream APPLY TOPICALLY EVERY TWO HOURS FOR 4 DAYS AS NEEDED FOR COLD SORES (Patient taking differently: Apply 1 application topically See admin instructions. Apply topically every 2 hours for 4 days as needed for cold sores.) 5 g 0  . fluticasone (FLONASE) 50 MCG/ACT nasal spray Place 2 sprays into both nostrils daily. 48 g 1  . gabapentin (NEURONTIN) 100 MG capsule Take 2 capsules (200 mg total) by mouth at bedtime. 180 capsule 3  . gabapentin (NEURONTIN) 300 MG capsule Take 1 capsule (300 mg total) by mouth at bedtime. 90 capsule 3  . Misc Natural Products (TART CHERRY ADVANCED PO) Take 1 capsule by mouth daily.    Marland Kitchen omeprazole (PRILOSEC) 40 MG capsule TAKE 1 CAPSULE BY MOUTH DAILY. (Patient taking differently: Take 40 mg by mouth daily. ) 90 capsule 1  . tiZANidine (ZANAFLEX) 4 MG capsule TAKE 1 CAPSULE BY MOUTH THREE TIMES DAILY (Patient taking differently: Take 4 mg by mouth at bedtime as needed for muscle spasms. ) 90 capsule 0  . Turmeric 400 MG CAPS  Take 400 mg by mouth daily.     Marland Kitchen etonogestrel (NEXPLANON) 68 MG IMPL implant 1 each by Subdermal route once.      Results for orders placed or performed during the hospital encounter of 03/13/20 (from the past 48 hour(s))  Pregnancy, urine POC     Status: None   Collection Time: 03/13/20  8:37 AM  Result Value Ref Range   Preg Test, Ur NEGATIVE NEGATIVE    Comment:        THE SENSITIVITY OF THIS METHODOLOGY IS >24 mIU/mL    No results found.  Review of Systems  All other systems reviewed and are negative.   Blood pressure 137/72, pulse 98, temperature 98.1 F (36.7 C), temperature source Oral, resp. rate 18, height 5\' 2"  (1.575 m), weight 134.7 kg, SpO2 99 %. Physical  Exam Constitutional:      Appearance: Normal appearance. She is normal weight.  HENT:     Head: Normocephalic and atraumatic.     Right Ear: External ear normal.     Left Ear: External ear normal.     Nose: Nose normal.     Mouth/Throat:     Mouth: Mucous membranes are moist.     Pharynx: Oropharynx is clear.  Eyes:     Extraocular Movements: Extraocular movements intact.     Conjunctiva/sclera: Conjunctivae normal.     Pupils: Pupils are equal, round, and reactive to light.  Cardiovascular:     Rate and Rhythm: Normal rate.  Pulmonary:     Effort: Pulmonary effort is normal.  Skin:    General: Skin is warm and dry.  Neurological:     General: No focal deficit present.     Mental Status: She is alert and oriented to person, place, and time.  Psychiatric:        Mood and Affect: Mood normal.        Behavior: Behavior normal.        Thought Content: Thought content normal.        Judgment: Judgment normal.      Assessment/Plan Subglottic stenosis  To OR for SMDL with CO2 laser dilation and mitomycin C application.  , MD 03/13/2020, 10:42 AM

## 2020-03-13 NOTE — Brief Op Note (Signed)
03/13/2020  12:04 PM  PATIENT:  Lisa [name]  44 y.o. female  PRE-OPERATIVE DIAGNOSIS:  Subglottic stenosis  POST-OPERATIVE DIAGNOSIS:  Subglottic stenosis  PROCEDURE:  Procedure(s): MICROLARYNGOSCOPY WITH DILATION w/JET VENT MITOMYCIN C (N/A)  SURGEON:  Surgeon(s) and Role:    * Christia Reading, MD - Primary  PHYSICIAN ASSISTANT:   ASSISTANTS: none   ANESTHESIA:   general  EBL: Minimal  BLOOD ADMINISTERED:none  DRAINS: none   LOCAL MEDICATIONS USED:  NONE  SPECIMEN:  No Specimen  DISPOSITION OF SPECIMEN:  N/A  COUNTS:  YES  TOURNIQUET:  * No tourniquets in log *  DICTATION: .Note written in EPIC  PLAN OF CARE: Discharge to home after PACU  PATIENT DISPOSITION:  PACU - hemodynamically stable.   Delay start of Pharmacological VTE agent (>24hrs) due to surgical blood loss or risk of bleeding: no

## 2020-03-13 NOTE — Transfer of Care (Signed)
Immediate Anesthesia Transfer of Care Note  Patient: Lisa [name]  Procedure(s) Performed: MICROLARYNGOSCOPY WITH DILATION w/JET VENT MITOMYCIN C (N/A )  Patient Location: PACU  Anesthesia Type:General  Level of Consciousness: drowsy and patient cooperative  Airway & Oxygen Therapy: Patient Spontanous Breathing and Patient connected to face mask oxygen  Post-op Assessment: Report given to RN and Post -op Vital signs reviewed and stable  Post vital signs: Reviewed and stable  Last Vitals:  Vitals Value Taken Time  BP 133/73 03/13/20 1216  Temp 36.7 C 03/13/20 1215  Pulse 67 03/13/20 1219  Resp 9 03/13/20 1219  SpO2 100 % 03/13/20 1219  Vitals shown include unvalidated device data.  Last Pain:  Vitals:   03/13/20 0854  TempSrc:   PainSc: 3       Patients Stated Pain Goal: 3 (03/13/20 0854)  Complications: No complications documented.

## 2020-03-14 ENCOUNTER — Encounter (HOSPITAL_COMMUNITY): Payer: Self-pay | Admitting: Otolaryngology

## 2020-03-17 ENCOUNTER — Encounter: Payer: Self-pay | Admitting: Anesthesiology

## 2020-03-17 ENCOUNTER — Ambulatory Visit: Payer: No Typology Code available for payment source | Admitting: Physical Therapy

## 2020-03-19 ENCOUNTER — Ambulatory Visit: Payer: No Typology Code available for payment source | Attending: Anesthesiology | Admitting: Physical Therapy

## 2020-03-19 ENCOUNTER — Encounter: Payer: Self-pay | Admitting: Physical Therapy

## 2020-03-19 ENCOUNTER — Other Ambulatory Visit: Payer: Self-pay

## 2020-03-19 DIAGNOSIS — R519 Headache, unspecified: Secondary | ICD-10-CM | POA: Insufficient documentation

## 2020-03-19 DIAGNOSIS — M542 Cervicalgia: Secondary | ICD-10-CM | POA: Diagnosis present

## 2020-03-19 DIAGNOSIS — M5412 Radiculopathy, cervical region: Secondary | ICD-10-CM | POA: Diagnosis present

## 2020-03-19 DIAGNOSIS — M546 Pain in thoracic spine: Secondary | ICD-10-CM | POA: Insufficient documentation

## 2020-03-19 NOTE — Therapy (Signed)
[facility] PHYSICAL AND SPORTS MEDICINE [address] [place], [place], [address] Phone: [phone]   Fax:  [phone]  Physical Therapy Treatment  Patient Details  Name: Lisa [name] MRN: [id number] Date of Birth: 07-08-1975 Referring Provider (PT): Yevette Edwards, MD (pain clinic)   Encounter Date: 03/19/2020   PT End of Session - 03/19/20 1738    Visit Number 4    Number of Visits 24    Date for PT Re-Evaluation 05/27/20    Authorization Type [employer] reporting period from 03/04/2020    Progress Note Due on Visit 10    PT Start Time 1730    PT Stop Time 1810    PT Time Calculation (min) 40 min    Activity Tolerance Patient tolerated treatment well    Behavior During Therapy [facility] for tasks assessed/performed           Past Medical History:  Diagnosis Date   Bronchitis    hx of   Cervical radiculitis 03/03/2020   Complication of anesthesia    DDD (degenerative disc disease), cervical 02/28/2020   Dysrhythmia    "does not see cardiologist"   Family history of anesthesia complication    "morphine night terrors"   GERD (gastroesophageal reflux disease)    Headache(784.0)    "migraines"   Hypertension    PONV (postoperative nausea and vomiting)    Shortness of breath    "wheezing"   Weakness of left arm 03/03/2020    Past Surgical History:  Procedure Laterality Date   CESAREAN SECTION     x 2   MICROLARYNGOSCOPY WITH DILATION N/A 03/13/2020   Procedure: MICROLARYNGOSCOPY WITH DILATION w/JET VENT MITOMYCIN C;  Surgeon: Christia Reading, MD;  [facility];  Service: ENT;  Laterality: N/A;   MICROLARYNGOSCOPY WITH LASER AND BALLOON DILATION N/A 07/20/2012   Procedure: MICROLARYNGOSCOPY WITH LASER AND BALLOON DILATION;  Surgeon: Christia Reading, MD;  Location: [facility] OR;  Service: ENT;  Laterality: N/A;  WITH JET VENTURI VENTILATION   TONGUE FLAP RELEASE     widom extractions      There were no vitals  filed for this visit.   Subjective Assessment - 03/19/20 1733    Subjective [name] reports she headache and neck pain at the base of her scull upon arrival. her procedure went well last week with less pain than she had expectede and her left arm felt better following the procedure. Used her L UE quite a bit (transfers and holding a groin) today and it has been weak and with paresthesia but no pain. Current pain number is 2/10 with left eye headache and over eye. Rib area was pretty sore yesterday but did not notice today. Dr. Jenne Pane who did her procedure sent a message to PT to clear patient for return to PT after the procedure he did.    Pertinent History Patient is a 44 y.o. female who presents to outpatient physical therapy with a referral for medical diagnosis cervicalgia, cervical degenerative disc disease. This patient's chief complaints consist of increased neck pain and new left UE pain/weakness/paresthesia and left sided rib pain on top of chronic left neck pain radiating to the left face and headache, leading to the following functional deficits: difficulty using left UE for manual tasks, using the phone, work tasks, holding pressure on wounds (unable), holding things without dropping them, opening packages, pulling back a stopper in a syringe, holding a cup, taking lids off, grooming, folding laundry, making the  bed, anything that requires two hands and force, gardening, yardwork, raking, clipping, cutting, dressing, showering, sleeping. Relevant past medical history and comorbidities include diabetes (currently controlled), idiopathic subglottic tracheal stenosis (surgery coming up end of month, causes shortness of breath upon exertion), low back pain on left side, obesity, GERD, when she takes caffeine she gest palpitations, high blood pressure.  Patient denies hx of cancer, stroke, seizures, lung problem (besides trachea problem), major cardiac events, unexplained weight loss, changes in bowel or  bladder problems, new onset stumbling or dropping things apart from described below.    Limitations House hold activities;Other (comment);Lifting   usinghands and force, gardening, yardwork, raking, clipping, cutting, dressing, showering, sleeping.   Diagnostic tests C-spine MRI report 02/20/2020: "IMPRESSION:1. Minimal disc osteophyte complex with left uncovertebral spurringat C5-C6 resulting in mild left foraminal stenosis.2. No canal stenosis at any level."    Patient Stated Goals "to not have arm pain"    Currently in Pain? Yes    Pain Score 2     Pain Location Neck    Pain Onset More than a month ago            TREATMENT: DOES have latex allergy  Therapeutic exercise:to centralize symptoms and improve ROM, strength, muscular endurance, and activity tolerance required for successful completion of functional activities. - seated head nods AROM x10 - seated cervical spine retraction AROM x 10 - seated cervical retraction and upper cervical flexion AROM x 10  - seated thoracic extension over back of chair: with airex in seat 1x10.  - seated cervical spine AROM (more limited and tender in left rotation) - seated left cervical AROM with self overpressure x 10 (using L UE).   Manual therapy:to reduce pain and tissue tension, improve range of motion, neuromodulation, in order to promote improved ability to complete functional activities.  Supine position with feet elevated:  - STM to posterior cervical spine from suboccipital region to upper traps and levator scap.    HOME EXERCISE PROGRAM Access Code: [email or web address] URL: [email or web address] Date: 03/12/2020 Prepared by: Norton BlizzardSara Romi Rathel  Exercises Seated Cervical Flexion AROM - 6 x daily - 1 sets - 10 reps          PT Education - 03/19/20 1738    Education Details Exercise purpose/form. Self management techniques    Person(s) Educated Patient    Methods Explanation;Demonstration;Tactile cues;Verbal cues      Comprehension Verbalized understanding;Returned demonstration;Verbal cues required;Tactile cues required;Need further instruction               PT Long Term Goals - 03/05/20 1919      PT LONG TERM GOAL #1   Title Be independent with a long-term home exercise program for self-management of symptoms.    Baseline initial HEP provided at IE (03/04/2020);    Time 12    Period Weeks    Status New   TARGET DATE FOR ALL LONG TERM GOALS: 05/27/2020     PT LONG TERM GOAL #2   Title Demonstrate improved FOTO score by 10 units to demonstrate improvement in overall condition and self-reported functional ability.    Baseline deferred to visit 2 (03/04/2020);    Time 12    Period Weeks    Status New      PT LONG TERM GOAL #3   Title Improve left UE  strength to equal or greater than  4+/5 for improved ability to allow patient to complete valued functional tasks such as lifting, pressing, grasping, holding, etc,  with  less difficulty.    Baseline wrist extension 3/5 and reports weakness with repetition (03/05/2020);    Time 12    Period Weeks    Status New      PT LONG TERM GOAL #4   Title Patient will report no longer having pain down the left arm to improve ability to use left UE for functional tasks such as grasping, pressing, stabilizing.    Baseline 8/10 (03/05/2020);    Time 12    Period Weeks    Status New      PT LONG TERM GOAL #5   Title Complete community, work and/or recreational activities without limitation due to current condition.    Baseline Functional Limitations: using left UE for manual tasks, using the phone, work tasks, holding pressure on wounds (unable), holding things without dropping them, opening packages, pulling back a stopper in a syringe, holding a cup, taking lids off, grooming, folding laundry, making the bed, anything that requires two hands and force, gardening, yardwork, raking, clipping, cutting, dressing, showering, sleeping (03/04/2020);    Time 12     Period Weeks    Status New                 Plan - 03/19/20 1932    Clinical Impression Statement Patient tolerated treatment well overall and demonstrates improvements in pain in the left arm and functional limitation. Continues to have radicular symptoms in the left arm, faint rib pain, and neck and headaches that interfere with her work and functional activities. Patient would benefit from continued management of limiting condition by skilled physical therapist to address remaining impairments and functional limitations to work towards stated goals and return to PLOF or maximal functional independence.    Personal Factors and Comorbidities Age;Comorbidity 3+;Past/Current Experience;Fitness;Time since onset of injury/illness/exacerbation;Profession;Education    Comorbidities Relevant past medical history and comorbidities include diabetes (currently controlled), idiopathic subglottic tracheal stenosis (surgery coming up end of month, causes shortness of breath upon exertion), low back pain on left side, obesity, GERD, when she takes caffeine she gest palpitations, high blood pressure.    Examination-Activity Limitations Bathing;Hygiene/Grooming;Lift;Caring for Others;Reach Overhead;Carry;Dressing;Sleep    Examination-Participation Restrictions Community Activity;Occupation;Yard Work;Meal Prep;Laundry;Cleaning;Interpersonal Relationship;Other   using left UE for manual tasks, using the phone, holding pressure on wounds (unable), holding things without dropping them, opening packages, pulling back a stopper in a syringe, holding a cup, taking lids off, making the bed   Stability/Clinical Decision Making Evolving/Moderate complexity    Rehab Potential Good    PT Frequency 2x / week    PT Duration 12 weeks    PT Treatment/Interventions Moist Heat;Therapeutic exercise;Manual techniques;Dry needling;Electrical Stimulation;ADLs/Self Care Home Management;Cryotherapy;Traction;Therapeutic  activities;Neuromuscular re-education;Patient/family education;Passive range of motion;Taping;Joint Manipulations;Spinal Manipulations    PT Next Visit Plan manual therapy, exercise as tolerated    PT Home Exercise Plan repeated thoracic extension and cervical spine flexion    Consulted and Agree with Plan of Care Patient           Patient will benefit from skilled therapeutic intervention in order to improve the following deficits and impairments:  Pain, Decreased activity tolerance, Impaired sensation, Improper body mechanics, Postural dysfunction, Increased muscle spasms, Decreased coordination, Impaired tone, Decreased endurance, Decreased range of motion, Decreased strength, Impaired perceived functional ability, Obesity, Impaired UE functional use  Visit Diagnosis: Radiculopathy, cervical region  Cervicalgia  Nonintractable headache, unspecified chronicity pattern, unspecified headache type  Pain in thoracic spine     Problem List Patient Active Problem List   Diagnosis  Date Noted   Cervical radiculitis 03/03/2020   Weakness of left arm 03/03/2020   Cervicalgia 02/28/2020   Spondylosis, cervical, 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   Acute URI 12/04/2019   Acute maxillary sinusitis 12/04/2019   Loud snoring 08/01/2019   B12 deficiency 08/01/2019   Eczema of left external ear 08/01/2019   Hyperlipidemia associated with type 2 diabetes mellitus (HCC) 07/31/2019   Nonallopathic lesion of cervical region 04/29/2019   Nonallopathic lesion of thoracic region 04/29/2019   Nonallopathic lesion of rib cage 04/29/2019   Cervical radiculopathy at C7 03/26/2019   Abnormal breast exam 01/24/2019   Elevated ALT measurement 01/24/2019   Allergic rhinitis due to pollen 08/18/2018   Abnormal mammogram of right breast 02/27/2018   Dermatitis 05/02/2017   Type 2 diabetes mellitus without complications (HCC) 05/02/2017   Idiopathic subglottic tracheal stenosis 06/21/2016     Encounter for general adult medical examination with abnormal findings 04/29/2016   Low back pain without sciatica 04/28/2015   Class 3 severe obesity without serious comorbidity with body mass index (BMI) of 40.0 to 44.9 in adult [facility]) 04/28/2015   Pre-syncope 07/14/2014    Luretha Murphy. Ilsa Iha, PT, DPT 03/19/20, 7:34 PM  [facility] PHYSICAL AND SPORTS MEDICINE [address] [place], [address] Phone: [phone]   Fax:  [phone]  Name: KAELYNNE CHRISTLEY MRN: [id number] Date of Birth: 1976/02/25

## 2020-03-24 ENCOUNTER — Ambulatory Visit: Payer: No Typology Code available for payment source | Admitting: Physical Therapy

## 2020-03-25 ENCOUNTER — Ambulatory Visit: Payer: No Typology Code available for payment source | Admitting: Physical Therapy

## 2020-03-25 ENCOUNTER — Ambulatory Visit: Payer: No Typology Code available for payment source | Admitting: Internal Medicine

## 2020-03-26 ENCOUNTER — Ambulatory Visit: Payer: No Typology Code available for payment source | Admitting: Physical Therapy

## 2020-03-26 ENCOUNTER — Encounter: Payer: Self-pay | Admitting: Physical Therapy

## 2020-03-26 ENCOUNTER — Other Ambulatory Visit: Payer: Self-pay

## 2020-03-26 DIAGNOSIS — M542 Cervicalgia: Secondary | ICD-10-CM

## 2020-03-26 DIAGNOSIS — R519 Headache, unspecified: Secondary | ICD-10-CM

## 2020-03-26 DIAGNOSIS — M5412 Radiculopathy, cervical region: Secondary | ICD-10-CM | POA: Diagnosis not present

## 2020-03-26 DIAGNOSIS — M546 Pain in thoracic spine: Secondary | ICD-10-CM

## 2020-03-26 NOTE — Therapy (Signed)
[facility] [facility] PHYS[facility] AND SPOR[facility] MEDICINE [address] [place], [place], [address] Phone: [phone]   Fax:  [phone]  Physical Therapy Treatment  Patient Details  Name: Lisa [name] MRN: [id number] Date of Birth: 05/04/1976 Referring Provider (PT): Yevette EdwardsAdams, James G, MD (pain clinic)   Encounter Date: 03/26/2020   PT End of Session - 03/26/20 1726    Visit Number 5    Number of Visits 24    Date for PT Re-Evaluation 05/27/20    Authorization Type [place] FOCUS reporting period from 03/04/2020    Progress Note Due on Visit 10    PT Start Time 1720    PT Stop Time 1800    PT Time Calculation (min) 40 min    Activity Tolerance Patient tolerated treatment well    Behavior During Therapy [facility] for tasks assessed/performed           Past Medical History:  Diagnosis Date  . Bronchitis    hx of  . Cervical radiculitis 03/03/2020  . Complication of anesthesia   . DDD (degenerative disc disease), cervical 02/28/2020  . Dysrhythmia    "does not see cardiologist"  . Family history of anesthesia complication    "morphine night terrors"  . GERD (gastroesophageal reflux disease)   . Headache(784.0)    "migraines"  . Hypertension   . PONV (postoperative nausea and vomiting)   . Shortness of breath    "wheezing"  . Weakness of left arm 03/03/2020    Past Surgical History:  Procedure Laterality Date  . CESAREAN SECTION     x 2  . MICROLARYNGOSCOPY WITH DILATION N/A 03/13/2020   Procedure: MICROLARYNGOSCOPY WITH DILATION w/JET VENT MITOMYCIN C;  Surgeon: Christia ReadingBates, Dwight, MD;  [facility];  Service: ENT;  Laterality: N/A;  . MICROLARYNGOSCOPY WITH LASER AND BALLOON DILATION N/A 07/20/2012   Procedure: MICROLARYNGOSCOPY WITH LASER AND BALLOON DILATION;  Surgeon: Christia Readingwight Bates, MD;  Location: [facility] OR;  Service: ENT;  Laterality: N/A;  WITH JET VENTURI VENTILATION  . TONGUE FLAP RELEASE    . widom extractions      There were no vitals  filed for this visit.   Subjective Assessment - 03/26/20 1722    Subjective Patient reports she had a headache at the back of her head the day after her last treatment session and missed work because of it. Since then she has had less headaches including behind the left eye. Her left arm continues to bother her and she still has weakness there but not quite as bad. Reports her current pain as 1/10 at base of her scull on the left and over the left upper trap. Rib seems to be getting better. Patient has been performing ROM exercise and cervical retraction exercise.    Pertinent History Patient is a 44 y.o. female who presents to outpatient physical therapy with a referral for medical diagnosis cervicalgia, cervical degenerative disc disease. This patient's chief complaints consist of increased neck pain and new left UE pain/weakness/paresthesia and left sided rib pain on top of chronic left neck pain radiating to the left face and headache, leading to the following functional deficits: difficulty using left UE for manual tasks, using the phone, work tasks, holding pressure on wounds (unable), holding things without dropping them, opening packages, pulling back a stopper in a syringe, holding a cup, taking lids off, grooming, folding laundry, making the bed, anything that requires two hands and force, gardening, yardwork, raking, clipping, cutting, dressing, showering, sleeping.  Relevant past medical history and comorbidities include diabetes (currently controlled), idiopathic subglottic tracheal stenosis (surgery coming up end of month, causes shortness of breath upon exertion), low back pain on left side, obesity, GERD, when she takes caffeine she gest palpitations, high blood pressure.  Patient denies hx of cancer, stroke, seizures, lung problem (besides trachea problem), major cardiac events, unexplained weight loss, changes in bowel or bladder problems, new onset stumbling or dropping things apart from  described below.    Limitations House hold activities;Other (comment);Lifting   usinghands and force, gardening, yardwork, raking, clipping, cutting, dressing, showering, sleeping.   Diagnostic tests C-spine MRI report 02/20/2020: "IMPRESSION:1. Minimal disc osteophyte complex with left uncovertebral spurringat C5-C6 resulting in mild left foraminal stenosis.2. No canal stenosis at any level."    Patient Stated Goals "to not have arm pain"    Currently in Pain? Yes    Pain Score 1     Pain Location Neck    Pain Orientation Left    Pain Onset More than a month ago          TREATMENT: DOES have latex allergy  Therapeutic exercise:to centralize symptoms and improve ROM, strength, muscular endurance, and activity tolerance required for successful completion of functional activities. - seated cervical spine retraction with upper cervical flexion AROM 2x 10 (sore after 10).  - standing scapular rows, 3x10.  - seated thoracic extension over back of chair: with airex in seat 2x10 (between each set of scapular rows).  - seated bilateral AROM scaption, 3x10 - repeated left cervical spine sidebending and rotation with self overpressure, x 4 (discontinued due to pain)  HOME EXERCISE PROGRAM Access Code: [email or web address] URL: [email or web address] Date: 03/12/2020 Prepared by: Norton Blizzard  Exercises Seated Cervical Flexion AROM - 6 x daily - 1 sets - 10 reps            PT Education - 03/26/20 1726    Education Details Exercise purpose/form. Self management techniques    Person(s) Educated Patient    Methods Explanation;Demonstration;Tactile cues;Verbal cues    Comprehension Verbalized understanding;Returned demonstration;Verbal cues required;Tactile cues required;Need further instruction               PT Long Term Goals - 03/05/20 1919      PT LONG TERM GOAL #1   Title Be independent with a long-term home exercise program for self-management of symptoms.      Baseline initial HEP provided at IE (03/04/2020);    Time 12    Period Weeks    Status New   TARGET DATE FOR ALL LONG TERM GOALS: 05/27/2020     PT LONG TERM GOAL #2   Title Demonstrate improved FOTO score by 10 units to demonstrate improvement in overall condition and self-reported functional ability.    Baseline deferred to visit 2 (03/04/2020);    Time 12    Period Weeks    Status New      PT LONG TERM GOAL #3   Title Improve left UE  strength to equal or greater than  4+/5 for improved ability to allow patient to complete valued functional tasks such as lifting, pressing, grasping, holding, etc,  with less difficulty.    Baseline wrist extension 3/5 and reports weakness with repetition (03/05/2020);    Time 12    Period Weeks    Status New      PT LONG TERM GOAL #4   Title Patient will report no longer having pain down the left arm to improve ability to  use left UE for functional tasks such as grasping, pressing, stabilizing.    Baseline 8/10 (03/05/2020);    Time 12    Period Weeks    Status New      PT LONG TERM GOAL #5   Title Complete community, work and/or recreational activities without limitation due to current condition.    Baseline Functional Limitations: using left UE for manual tasks, using the phone, work tasks, holding pressure on wounds (unable), holding things without dropping them, opening packages, pulling back a stopper in a syringe, holding a cup, taking lids off, grooming, folding laundry, making the bed, anything that requires two hands and force, gardening, yardwork, raking, clipping, cutting, dressing, showering, sleeping (03/04/2020);    Time 12    Period Weeks    Status New                 Plan - 03/26/20 1822    Clinical Impression Statement Patient tolerated treatment well overall with some increased pain in the left shoulder region with the third set of scaption, after she unsuccessfully tried left cervical sidebending and rotation. No  headache or neck pain by end of session. Appears to be gradually improving. Patient would benefit from continued management of limiting condition by skilled physical therapist to address remaining impairments and functional limitations to work towards stated goals and return to PLOF or maximal functional independence.    Personal Factors and Comorbidities Age;Comorbidity 3+;Past/Current Experience;Fitness;Time since onset of injury/illness/exacerbation;Profession;Education    Comorbidities Relevant past medical history and comorbidities include diabetes (currently controlled), idiopathic subglottic tracheal stenosis (surgery coming up end of month, causes shortness of breath upon exertion), low back pain on left side, obesity, GERD, when she takes caffeine she gest palpitations, high blood pressure.    Examination-Activity Limitations Bathing;Hygiene/Grooming;Lift;Caring for Others;Reach Overhead;Carry;Dressing;Sleep    Examination-Participation Restrictions Community Activity;Occupation;Yard Work;Meal Prep;Laundry;Cleaning;Interpersonal Relationship;Other   using left UE for manual tasks, using the phone, holding pressure on wounds (unable), holding things without dropping them, opening packages, pulling back a stopper in a syringe, holding a cup, taking lids off, making the bed   Stability/Clinical Decision Making Evolving/Moderate complexity    Rehab Potential Good    PT Frequency 2x / week    PT Duration 12 weeks    PT Treatment/Interventions Moist Heat;Therapeutic exercise;Manual techniques;Dry needling;Electrical Stimulation;ADLs/Self Care Home Management;Cryotherapy;Traction;Therapeutic activities;Neuromuscular re-education;Patient/family education;Passive range of motion;Taping;Joint Manipulations;Spinal Manipulations    PT Next Visit Plan manual therapy, exercise as tolerated    PT Home Exercise Plan repeated thoracic extension and cervical spine flexion    Consulted and Agree with Plan of Care  Patient           Patient will benefit from skilled therapeutic intervention in order to improve the following deficits and impairments:  Pain, Decreased activity tolerance, Impaired sensation, Improper body mechanics, Postural dysfunction, Increased muscle spasms, Decreased coordination, Impaired tone, Decreased endurance, Decreased range of motion, Decreased strength, Impaired perceived functional ability, Obesity, Impaired UE functional use  Visit Diagnosis: Radiculopathy, cervical region  Cervicalgia  Nonintractable headache, unspecified chronicity pattern, unspecified headache type  Pain in thoracic spine     Problem List Patient Active Problem List   Diagnosis Date Noted  . Cervical radiculitis 03/03/2020  . Weakness of left arm 03/03/2020  . Cervicalgia 02/28/2020  . Spondylosis, cervical, 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  . Acute URI 12/04/2019  . Acute maxillary sinusitis 12/04/2019  . Loud snoring 08/01/2019  . B12 deficiency 08/01/2019  . Eczema of left external ear 08/01/2019  .  Hyperlipidemia associated with type 2 diabetes mellitus (HCC) 07/31/2019  . Nonallopathic lesion of cervical region 04/29/2019  . Nonallopathic lesion of thoracic region 04/29/2019  . Nonallopathic lesion of rib cage 04/29/2019  . Cervical radiculopathy at C7 03/26/2019  . Abnormal breast exam 01/24/2019  . Elevated ALT measurement 01/24/2019  . Allergic rhinitis due to pollen 08/18/2018  . Abnormal mammogram of right breast 02/27/2018  . Dermatitis 05/02/2017  . Type 2 diabetes mellitus without complications (HCC) 05/02/2017  . Idiopathic subglottic tracheal stenosis 06/21/2016  . Encounter for general adult medical examination with abnormal findings 04/29/2016  . Low back pain without sciatica 04/28/2015  . Class 3 severe obesity without serious comorbidity with body mass index (BMI) of 40.0 to 44.9 in adult (HCC) 04/28/2015  . Pre-syncope 07/14/2014    Lisa [name]. Lisa [name], PT,  DPT 03/26/20, 7:20 PM  [facility] PHYSICAL AND SPORTS MEDICINE [address] [place], [address] Phone: [phone]   Fax:  [phone]  Name: Lisa [name] MRN: [id number] Date of Birth: [birth year]

## 2020-03-31 ENCOUNTER — Ambulatory Visit: Payer: No Typology Code available for payment source | Admitting: Physical Therapy

## 2020-03-31 ENCOUNTER — Other Ambulatory Visit: Payer: Self-pay

## 2020-03-31 ENCOUNTER — Encounter: Payer: Self-pay | Admitting: Physical Therapy

## 2020-03-31 DIAGNOSIS — M546 Pain in thoracic spine: Secondary | ICD-10-CM

## 2020-03-31 DIAGNOSIS — M5412 Radiculopathy, cervical region: Secondary | ICD-10-CM | POA: Diagnosis not present

## 2020-03-31 DIAGNOSIS — R519 Headache, unspecified: Secondary | ICD-10-CM

## 2020-03-31 DIAGNOSIS — M542 Cervicalgia: Secondary | ICD-10-CM

## 2020-03-31 NOTE — Therapy (Signed)
[place] [facility] PH[facility]IC[facility] [address] [place], [place], [address] Phone: [phone]   Fax:  [phone]  Physical Therapy Treatment  Patient Details  Name: Lisa [name] MRN: [id number] Date of Birth: [birth year] Referring Provider (PT): Yevette EdwardsAdams, James G, MD (pain clinic)   Encounter Date: 03/31/2020   PT End of Session - 03/31/20 1654    Visit Number 6    Number of Visits 24    Date for PT Re-Evaluation 05/27/20    Authorization Type [place] FOCUS reporting period from 03/04/2020    Progress Note Due on Visit 10    PT Start Time 1650    PT Stop Time 1730    PT Time Calculation (min) 40 min    Activity Tolerance Patient tolerated treatment well    Behavior During Therapy [facility] for tasks assessed/performed           Past Medical History:  Diagnosis Date  . Bronchitis    hx of  . Cervical radiculitis 03/03/2020  . Complication of anesthesia   . DDD (degenerative disc disease), cervical 02/28/2020  . Dysrhythmia    "does not see cardiologist"  . Family history of anesthesia complication    "morphine night terrors"  . GERD (gastroesophageal reflux disease)   . Headache(784.0)    "migraines"  . Hypertension   . PONV (postoperative nausea and vomiting)   . Shortness of breath    "wheezing"  . Weakness of left arm 03/03/2020    Past Surgical History:  Procedure Laterality Date  . CESAREAN SECTION     x 2  . MICROLARYNGOSCOPY WITH DILATION N/A 03/13/2020   Procedure: MICROLARYNGOSCOPY WITH DILATION w/JET VENT MITOMYCIN C;  Surgeon: Christia ReadingBates, Dwight, MD;  [facility];  Service: ENT;  Laterality: N/A;  . MICROLARYNGOSCOPY WITH LASER AND BALLOON DILATION N/A 07/20/2012   Procedure: MICROLARYNGOSCOPY WITH LASER AND BALLOON DILATION;  Surgeon: Christia Readingwight Bates, MD;  Location: [facility] OR;  Service: ENT;  Laterality: N/A;  WITH JET VENTURI VENTILATION  . TONGUE FLAP RELEASE    . widom extractions      There were no vitals  filed for this visit.   Subjective Assessment - 03/31/20 1651    Subjective Patient reports she is pretty sore today after doing a lot on Friday and over the weekend. She states her arm doesn't feel as weak but doing things with her hand is still pretty hard. Pain is 1/10 achy over the left shoulder and forearm. No head or eye pain. She felt okay following her last treatment session. Sore the next day but no increase in headache or pain. Did not do any of the exercises since she was here last.    Pertinent History Patient is a 44 y.o. female who presents to outpatient physical therapy with a referral for medical diagnosis cervicalgia, cervical degenerative disc disease. This patient's chief complaints consist of increased neck pain and new left UE pain/weakness/paresthesia and left sided rib pain on top of chronic left neck pain radiating to the left face and headache, leading to the following functional deficits: difficulty using left UE for manual tasks, using the phone, work tasks, holding pressure on wounds (unable), holding things without dropping them, opening packages, pulling back a stopper in a syringe, holding a cup, taking lids off, grooming, folding laundry, making the bed, anything that requires two hands and force, gardening, yardwork, raking, clipping, cutting, dressing, showering, sleeping. Relevant past medical history and comorbidities include diabetes (currently controlled),  idiopathic subglottic tracheal stenosis (surgery coming up end of month, causes shortness of breath upon exertion), low back pain on left side, obesity, GERD, when she takes caffeine she gest palpitations, high blood pressure.  Patient denies hx of cancer, stroke, seizures, lung problem (besides trachea problem), major cardiac events, unexplained weight loss, changes in bowel or bladder problems, new onset stumbling or dropping things apart from described below.    Limitations House hold activities;Other (comment);Lifting    usinghands and force, gardening, yardwork, raking, clipping, cutting, dressing, showering, sleeping.   Diagnostic tests C-spine MRI report 02/20/2020: "IMPRESSION:1. Minimal disc osteophyte complex with left uncovertebral spurringat C5-C6 resulting in mild left foraminal stenosis.2. No canal stenosis at any level."    Patient Stated Goals "to not have arm pain"    Currently in Pain? Yes    Pain Score 1     Pain Location Shoulder   and forearm   Pain Orientation Left    Pain Onset More than a month ago          TREATMENT: DOES have latex allergy  Therapeutic exercise:to centralize symptoms and improve ROM, strength, muscular endurance, and activity tolerance required for successful completion of functional activities. - seated cervical spine retraction with upper cervical flexion AROM 2x 10 (started feeling pain around the left eye over the back).  - cervical spine rotation x8 AROM both direction (brief left finger tingling)..  - seated thoracic extension over back of chair: with airex in seat 2x10  Circuit:  - hooklying B horizontal shoulder abduction, 2x10 with green theraband.  - hooklying D2 shoulder flexion with green theraband, 2x10 each direction.   Manual therapy: to reduce pain and tissue tension, improve range of motion, neuromodulation, in order to promote improved ability to complete functional activities. - hooklying STM to posterior cervical spine musculature focusing on L > R as tolerated.   HOME EXERCISE PROGRAM Access Code: [email or web address] URL: [email or web address] Date: 03/12/2020 Prepared by: Norton Blizzard  Exercises Seated Cervical Flexion AROM - 6 x daily - 1 sets - 10 reps        PT Education - 03/31/20 1654    Education Details Exercise purpose/form. Self management techniques    Person(s) Educated Patient    Methods Explanation;Demonstration;Tactile cues;Verbal cues    Comprehension Verbalized understanding;Returned  demonstration;Verbal cues required;Tactile cues required;Need further instruction               PT Long Term Goals - 03/05/20 1919      PT LONG TERM GOAL #1   Title Be independent with a long-term home exercise program for self-management of symptoms.    Baseline initial HEP provided at IE (03/04/2020);    Time 12    Period Weeks    Status New   TARGET DATE FOR ALL LONG TERM GOALS: 05/27/2020     PT LONG TERM GOAL #2   Title Demonstrate improved FOTO score by 10 units to demonstrate improvement in overall condition and self-reported functional ability.    Baseline deferred to visit 2 (03/04/2020);    Time 12    Period Weeks    Status New      PT LONG TERM GOAL #3   Title Improve left UE  strength to equal or greater than  4+/5 for improved ability to allow patient to complete valued functional tasks such as lifting, pressing, grasping, holding, etc,  with less difficulty.    Baseline wrist extension 3/5 and reports weakness with repetition (03/05/2020);    Time 12  Period Weeks    Status New      PT LONG TERM GOAL #4   Title Patient will report no longer having pain down the left arm to improve ability to use left UE for functional tasks such as grasping, pressing, stabilizing.    Baseline 8/10 (03/05/2020);    Time 12    Period Weeks    Status New      PT LONG TERM GOAL #5   Title Complete community, work and/or recreational activities without limitation due to current condition.    Baseline Functional Limitations: using left UE for manual tasks, using the phone, work tasks, holding pressure on wounds (unable), holding things without dropping them, opening packages, pulling back a stopper in a syringe, holding a cup, taking lids off, grooming, folding laundry, making the bed, anything that requires two hands and force, gardening, yardwork, raking, clipping, cutting, dressing, showering, sleeping (03/04/2020);    Time 12    Period Weeks    Status New                  Plan - 03/31/20 1715    Clinical Impression Statement Patient tolerated treatment well overall with no lasting Increase in pain or paresthesia. Found banded exercises very challenging with quick fatigue of left UE. Patient would benefit from continued management of limiting condition by skilled physical therapist to address remaining impairments and functional limitations to work towards stated goals and return to PLOF or maximal functional independence.    Personal Factors and Comorbidities Age;Comorbidity 3+;Past/Current Experience;Fitness;Time since onset of injury/illness/exacerbation;Profession;Education    Comorbidities Relevant past medical history and comorbidities include diabetes (currently controlled), idiopathic subglottic tracheal stenosis (surgery coming up end of month, causes shortness of breath upon exertion), low back pain on left side, obesity, GERD, when she takes caffeine she gest palpitations, high blood pressure.    Examination-Activity Limitations Bathing;Hygiene/Grooming;Lift;Caring for Others;Reach Overhead;Carry;Dressing;Sleep    Examination-Participation Restrictions Community Activity;Occupation;Yard Work;Meal Prep;Laundry;Cleaning;Interpersonal Relationship;Other   using left UE for manual tasks, using the phone, holding pressure on wounds (unable), holding things without dropping them, opening packages, pulling back a stopper in a syringe, holding a cup, taking lids off, making the bed   Stability/Clinical Decision Making Evolving/Moderate complexity    Rehab Potential Good    PT Frequency 2x / week    PT Duration 12 weeks    PT Treatment/Interventions Moist Heat;Therapeutic exercise;Manual techniques;Dry needling;Electrical Stimulation;ADLs/Self Care Home Management;Cryotherapy;Traction;Therapeutic activities;Neuromuscular re-education;Patient/family education;Passive range of motion;Taping;Joint Manipulations;Spinal Manipulations    PT Next Visit Plan  manual therapy, exercise as tolerated    PT Home Exercise Plan repeated thoracic extension and cervical spine flexion    Consulted and Agree with Plan of Care Patient           Patient will benefit from skilled therapeutic intervention in order to improve the following deficits and impairments:  Pain, Decreased activity tolerance, Impaired sensation, Improper body mechanics, Postural dysfunction, Increased muscle spasms, Decreased coordination, Impaired tone, Decreased endurance, Decreased range of motion, Decreased strength, Impaired perceived functional ability, Obesity, Impaired UE functional use  Visit Diagnosis: Radiculopathy, cervical region  Cervicalgia  Nonintractable headache, unspecified chronicity pattern, unspecified headache type  Pain in thoracic spine     Problem List Patient Active Problem List   Diagnosis Date Noted  . Cervical radiculitis 03/03/2020  . Weakness of left arm 03/03/2020  . Cervicalgia 02/28/2020  . Spondylosis, cervical, 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  . Acute URI 12/04/2019  . Acute maxillary sinusitis 12/04/2019  . Loud snoring  08/01/2019  . B12 deficiency 08/01/2019  . Eczema of left external ear 08/01/2019  . Hyperlipidemia associated with type 2 diabetes mellitus (HCC) 07/31/2019  . Nonallopathic lesion of cervical region 04/29/2019  . Nonallopathic lesion of thoracic region 04/29/2019  . Nonallopathic lesion of rib cage 04/29/2019  . Cervical radiculopathy at C7 03/26/2019  . Abnormal breast exam 01/24/2019  . Elevated ALT measurement 01/24/2019  . Allergic rhinitis due to pollen 08/18/2018  . Abnormal mammogram of right breast 02/27/2018  . Dermatitis 05/02/2017  . Type 2 diabetes mellitus without complications (HCC) 05/02/2017  . Idiopathic subglottic tracheal stenosis 06/21/2016  . Encounter for general adult medical examination with abnormal findings 04/29/2016  . Low back pain without sciatica 04/28/2015  . Class 3 severe obesity  without serious comorbidity with body mass index (BMI) of 40.0 to 44.9 in adult (HCC) 04/28/2015  . Pre-syncope 07/14/2014    Luretha Murphy. Ilsa Iha, PT, DPT 03/31/20, 5:45 PM   [facility] PHYSICAL AND SPORTS MEDICINE [address], [place], [address] Phone: [phone]   Fax:  [phone]  Name: Lisa [name] MRN: [id number] Date of Birth: 04-28-1976

## 2020-04-02 ENCOUNTER — Ambulatory Visit: Payer: No Typology Code available for payment source | Admitting: Physical Therapy

## 2020-04-02 ENCOUNTER — Ambulatory Visit: Payer: No Typology Code available for payment source | Admitting: Family Medicine

## 2020-04-07 ENCOUNTER — Ambulatory Visit: Payer: No Typology Code available for payment source | Admitting: Physical Therapy

## 2020-04-07 ENCOUNTER — Other Ambulatory Visit: Payer: Self-pay

## 2020-04-07 ENCOUNTER — Encounter: Payer: Self-pay | Admitting: Physical Therapy

## 2020-04-07 DIAGNOSIS — M546 Pain in thoracic spine: Secondary | ICD-10-CM

## 2020-04-07 DIAGNOSIS — R519 Headache, unspecified: Secondary | ICD-10-CM

## 2020-04-07 DIAGNOSIS — M542 Cervicalgia: Secondary | ICD-10-CM

## 2020-04-07 DIAGNOSIS — M5412 Radiculopathy, cervical region: Secondary | ICD-10-CM | POA: Diagnosis not present

## 2020-04-07 NOTE — Therapy (Signed)
[facility] [address], [place], [address] Phone: [phone]   Fax:  [phone]  Physical Therapy Treatment  Patient Details  Name: Lisa [name] MRN: [id number] Date of Birth: 01-12-1976 Referring Provider (PT): Yevette Edwards, MD (pain clinic)   Encounter Date: 04/07/2020   PT End of Session - 04/07/20 1908    Visit Number 7    Number of Visits 24    Date for PT Re-Evaluation 05/27/20    Authorization Type [employer] reporting period from 03/04/2020    Progress Note Due on Visit 10    PT Start Time 1715    PT Stop Time 1800    PT Time Calculation (min) 45 min    Activity Tolerance Patient tolerated treatment well    Behavior During Therapy [facility] for tasks assessed/performed           Past Medical History:  Diagnosis Date  . Bronchitis    hx of  . Cervical radiculitis 03/03/2020  . Complication of anesthesia   . DDD (degenerative disc disease), cervical 02/28/2020  . Dysrhythmia    "does not see cardiologist"  . Family history of anesthesia complication    "morphine night terrors"  . GERD (gastroesophageal reflux disease)   . Headache(784.0)    "migraines"  . Hypertension   . PONV (postoperative nausea and vomiting)   . Shortness of breath    "wheezing"  . Weakness of left arm 03/03/2020    Past Surgical History:  Procedure Laterality Date  . CESAREAN SECTION     x 2  . MICROLARYNGOSCOPY WITH DILATION N/A 03/13/2020   Procedure: MICROLARYNGOSCOPY WITH DILATION w/JET VENT MITOMYCIN C;  Surgeon: Christia Reading, MD;  Location: [facility] OR;  Service: ENT;  Laterality: N/A;  . MICROLARYNGOSCOPY WITH LASER AND BALLOON DILATION N/A 07/20/2012   Procedure: MICROLARYNGOSCOPY WITH LASER AND BALLOON DILATION;  Surgeon: Christia Reading, MD;  Location: [facility] OR;  Service: ENT;  Laterality: N/A;  WITH JET VENTURI VENTILATION  . TONGUE FLAP RELEASE    . widom extractions      There were no vitals  filed for this visit.   Subjective Assessment - 04/07/20 1718    Subjective [name] reports she has some pain in the left chest that is now only sore when she touches it. She took some tylenonl and ibuprofen that feels better. She did a lot of things around the house that used a lot of upper body activity but she didn't feel like she was over doing it at the time. She had a bit of left forearm pain with the activity. She thought the chest pain may be indigestion but tums did not help and she realized it was radiating to the left shoulder. She has had pain like this before but not as severe. HR and BP was good and it felt muscular. It was really sore yesterday but getting better. Pain is from sternum towards L shoulder. She is still having trouble reaching behind but she did okay following last treatment session.    Pertinent History Patient is a 44 y.o. female who presents to outpatient physical therapy with a referral for medical diagnosis cervicalgia, cervical degenerative disc disease. This patient's chief complaints consist of increased neck pain and new left UE pain/weakness/paresthesia and left sided rib pain on top of chronic left neck pain radiating to the left face and headache, leading to the following functional deficits: difficulty using left UE for manual tasks,  using the phone, work tasks, holding pressure on wounds (unable), holding things without dropping them, opening packages, pulling back a stopper in a syringe, holding a cup, taking lids off, grooming, folding laundry, making the bed, anything that requires two hands and force, gardening, yardwork, raking, clipping, cutting, dressing, showering, sleeping. Relevant past medical history and comorbidities include diabetes (currently controlled), idiopathic subglottic tracheal stenosis (surgery coming up end of month, causes shortness of breath upon exertion), low back pain on left side, obesity, GERD, when she takes caffeine she gest  palpitations, high blood pressure.  Patient denies hx of cancer, stroke, seizures, lung problem (besides trachea problem), major cardiac events, unexplained weight loss, changes in bowel or bladder problems, new onset stumbling or dropping things apart from described below.    Limitations House hold activities;Other (comment);Lifting   usinghands and force, gardening, yardwork, raking, clipping, cutting, dressing, showering, sleeping.   Diagnostic tests C-spine MRI report 02/20/2020: "IMPRESSION:1. Minimal disc osteophyte complex with left uncovertebral spurringat C5-C6 resulting in mild left foraminal stenosis.2. No canal stenosis at any level."    Patient Stated Goals "to not have arm pain"    Currently in Pain? Yes    Pain Score 2    upon palpation   Pain Location Chest    Pain Orientation Left;Anterior           TREATMENT: DOES have latex allergy  Therapeutic exercise:to centralize symptoms and improve ROM, strength, muscular endurance, and activity tolerance required for successful completion of functional activities. - seated cervical spine retractionwith upper cervical flexionAROM 2x 10 (started feeling familiar pain at left wrist).  - cervical rotation x10 each direction, x10 each side - seated thoracic extension over back of chair: with airex in seat2x10  Circuit:  - hooklying B horizontal shoulder abduction, 3x10 with green theraband.  - hooklying D2 shoulder flexion with green theraband, 3x10 each direction.   Manual therapy: to reduce pain and tissue tension, improve range of motion, neuromodulation, in order to promote improved ability to complete functional activities. - hooklying STM to posterior cervical spine musculature focusing on L > R as tolerated.   HOME EXERCISE PROGRAM Access Code: [email or web address] URL: [email or web address] Date: 03/12/2020 Prepared by: Norton Blizzard  Exercises Seated Cervical Flexion AROM - 6 x daily - 1 sets - 10  reps       PT Education - 04/07/20 1908    Education Details Exercise purpose/form. Self management techniques    Person(s) Educated Patient    Methods Explanation;Demonstration;Tactile cues;Verbal cues    Comprehension Verbalized understanding;Returned demonstration;Verbal cues required;Tactile cues required;Need further instruction               PT Long Term Goals - 03/05/20 1919      PT LONG TERM GOAL #1   Title Be independent with a long-term home exercise program for self-management of symptoms.    Baseline initial HEP provided at IE (03/04/2020);    Time 12    Period Weeks    Status New   TARGET DATE FOR ALL LONG TERM GOALS: 05/27/2020     PT LONG TERM GOAL #2   Title Demonstrate improved FOTO score by 10 units to demonstrate improvement in overall condition and self-reported functional ability.    Baseline deferred to visit 2 (03/04/2020);    Time 12    Period Weeks    Status New      PT LONG TERM GOAL #3   Title Improve left UE  strength to equal or greater than  4+/5 for improved ability to allow patient to complete valued functional tasks such as lifting, pressing, grasping, holding, etc,  with less difficulty.    Baseline wrist extension 3/5 and reports weakness with repetition (03/05/2020);    Time 12    Period Weeks    Status New      PT LONG TERM GOAL #4   Title Patient will report no longer having pain down the left arm to improve ability to use left UE for functional tasks such as grasping, pressing, stabilizing.    Baseline 8/10 (03/05/2020);    Time 12    Period Weeks    Status New      PT LONG TERM GOAL #5   Title Complete community, work and/or recreational activities without limitation due to current condition.    Baseline Functional Limitations: using left UE for manual tasks, using the phone, work tasks, holding pressure on wounds (unable), holding things without dropping them, opening packages, pulling back a stopper in a syringe, holding a  cup, taking lids off, grooming, folding laundry, making the bed, anything that requires two hands and force, gardening, yardwork, raking, clipping, cutting, dressing, showering, sleeping (03/04/2020);    Time 12    Period Weeks    Status New                 Plan - 04/07/20 1911    Clinical Impression Statement Patient tolerated treatment well overall with no increase in chest pain with exercises. Advised patient to seek further evaluation if chest pain does not continue to resolve. It was not reproduced by pressing hands together in front of chest. Continues to benefit from thoracic extension and appears to be gradually improving although she is still limited by weakness and radicular symptoms at times. Quite tender in the mid cervical spine L > R in soft tissue.  Patient would benefit from continued management of limiting condition by skilled physical therapist to address remaining impairments and functional limitations to work towards stated goals and return to PLOF or maximal functional independence.    Personal Factors and Comorbidities Age;Comorbidity 3+;Past/Current Experience;Fitness;Time since onset of injury/illness/exacerbation;Profession;Education    Comorbidities Relevant past medical history and comorbidities include diabetes (currently controlled), idiopathic subglottic tracheal stenosis (surgery coming up end of month, causes shortness of breath upon exertion), low back pain on left side, obesity, GERD, when she takes caffeine she gest palpitations, high blood pressure.    Examination-Activity Limitations Bathing;Hygiene/Grooming;Lift;Caring for Others;Reach Overhead;Carry;Dressing;Sleep    Examination-Participation Restrictions Community Activity;Occupation;Yard Work;Meal Prep;Laundry;Cleaning;Interpersonal Relationship;Other   using left UE for manual tasks, using the phone, holding pressure on wounds (unable), holding things without dropping them, opening packages, pulling back a  stopper in a syringe, holding a cup, taking lids off, making the bed   Stability/Clinical Decision Making Evolving/Moderate complexity    Rehab Potential Good    PT Frequency 2x / week    PT Duration 12 weeks    PT Treatment/Interventions Moist Heat;Therapeutic exercise;Manual techniques;Dry needling;Electrical Stimulation;ADLs/Self Care Home Management;Cryotherapy;Traction;Therapeutic activities;Neuromuscular re-education;Patient/family education;Passive range of motion;Taping;Joint Manipulations;Spinal Manipulations    PT Next Visit Plan manual therapy, exercise as tolerated    PT Home Exercise Plan repeated thoracic extension and cervical spine flexion    Consulted and Agree with Plan of Care Patient           Patient will benefit from skilled therapeutic intervention in order to improve the following deficits and impairments:  Pain, Decreased activity tolerance, Impaired sensation, Improper body mechanics, Postural dysfunction, Increased muscle  spasms, Decreased coordination, Impaired tone, Decreased endurance, Decreased range of motion, Decreased strength, Impaired perceived functional ability, Obesity, Impaired UE functional use  Visit Diagnosis: Radiculopathy, cervical region  Cervicalgia  Nonintractable headache, unspecified chronicity pattern, unspecified headache type  Pain in thoracic spine     Problem List Patient Active Problem List   Diagnosis Date Noted  . Cervical radiculitis 03/03/2020  . Weakness of left arm 03/03/2020  . Cervicalgia 02/28/2020  . Spondylosis, cervical, 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  . Acute URI 12/04/2019  . Acute maxillary sinusitis 12/04/2019  . Loud snoring 08/01/2019  . B12 deficiency 08/01/2019  . Eczema of left external ear 08/01/2019  . Hyperlipidemia associated with type 2 diabetes mellitus (HCC) 07/31/2019  . Nonallopathic lesion of cervical region 04/29/2019  . Nonallopathic lesion of thoracic region 04/29/2019  . Nonallopathic  lesion of rib cage 04/29/2019  . Cervical radiculopathy at C7 03/26/2019  . Abnormal breast exam 01/24/2019  . Elevated ALT measurement 01/24/2019  . Allergic rhinitis due to pollen 08/18/2018  . Abnormal mammogram of right breast 02/27/2018  . Dermatitis 05/02/2017  . Type 2 diabetes mellitus without complications (HCC) 05/02/2017  . Idiopathic subglottic tracheal stenosis 06/21/2016  . Encounter for general adult medical examination with abnormal findings 04/29/2016  . Low back pain without sciatica 04/28/2015  . Class 3 severe obesity without serious comorbidity with body mass index (BMI) of 40.0 to 44.9 in adult (HCC) 04/28/2015  . Pre-syncope 07/14/2014    Luretha MurphySara R. Ilsa IhaSnyder, PT, DPT 04/07/20, 7:12 PM  [facility] [address] [place], [place], [address] Phone: [phone]   Fax:  [phone]  Name: Lisa [name] MRN: [id number] Date of Birth: [birth year]

## 2020-04-13 ENCOUNTER — Telehealth: Payer: Self-pay | Admitting: Physical Therapy

## 2020-04-13 ENCOUNTER — Ambulatory Visit: Payer: No Typology Code available for payment source | Admitting: Physical Therapy

## 2020-04-13 NOTE — Telephone Encounter (Signed)
Called patient when she did not show up for her 6pm appointment today. No answer. Left VM advising patient of missed visit and when her next visit is scheduled (Wed 04/15/20 @ 2:30pm) Requested call back to reschedule or if she is unable to come to next visit. Call back number: [phone]   Lisa [name]. Ilsa Iha, PT, DPT 04/13/20, 7:05 PM

## 2020-04-15 ENCOUNTER — Ambulatory Visit: Payer: No Typology Code available for payment source | Admitting: Physical Therapy

## 2020-04-20 ENCOUNTER — Other Ambulatory Visit: Payer: Self-pay | Admitting: Internal Medicine

## 2020-04-20 ENCOUNTER — Telehealth: Payer: Self-pay | Admitting: Internal Medicine

## 2020-04-20 NOTE — Telephone Encounter (Signed)
Patient sent a message through MyChart to schedule a cpe in Dec. / at this time no cpe appointments available with provider. Lm for patient to call office to schedule a cpe in Jan.

## 2020-04-21 ENCOUNTER — Ambulatory Visit: Payer: No Typology Code available for payment source | Attending: Anesthesiology | Admitting: Physical Therapy

## 2020-04-21 ENCOUNTER — Encounter: Payer: Self-pay | Admitting: Physical Therapy

## 2020-04-21 ENCOUNTER — Other Ambulatory Visit: Payer: Self-pay

## 2020-04-21 DIAGNOSIS — M546 Pain in thoracic spine: Secondary | ICD-10-CM | POA: Diagnosis present

## 2020-04-21 DIAGNOSIS — R519 Headache, unspecified: Secondary | ICD-10-CM | POA: Insufficient documentation

## 2020-04-21 DIAGNOSIS — M542 Cervicalgia: Secondary | ICD-10-CM | POA: Diagnosis present

## 2020-04-21 DIAGNOSIS — M5412 Radiculopathy, cervical region: Secondary | ICD-10-CM | POA: Insufficient documentation

## 2020-04-21 NOTE — Therapy (Signed)
[facility] PHYSICAL AND SPOR[facility] MEDICINE [address] [place], [place], [address] Phone: [phone]   Fax:  [phone]  Physical Therapy Treatment  Patient Details  Name: Charlann LangeMichelle K Lento MRN: [id number] Date of Birth: 1975-08-22 Referring Provider (PT): Yevette EdwardsAdams, James G, MD (pain clinic)   Encounter Date: 04/21/2020   PT End of Session - 04/21/20 1801    Visit Number 8    Number of Visits 24    Date for PT Re-Evaluation 05/27/20    Authorization Type [employer] reporting period from 03/04/2020    Progress Note Due on Visit 10    PT Start Time 1753    PT Stop Time 1833    PT Time Calculation (min) 40 min    Activity Tolerance Patient tolerated treatment well    Behavior [facility] tasks assessed/performed           Past Medical History:  Diagnosis Date  . Bronchitis    hx of  . Cervical radiculitis 03/03/2020  . Complication of anesthesia   . DDD (degenerative disc disease), cervical 02/28/2020  . Dysrhythmia    "does not see cardiologist"  . Family history of anesthesia complication    "morphine night terrors"  . GERD (gastroesophageal reflux disease)   . Headache(784.0)    "migraines"  . Hypertension   . PONV (postoperative nausea and vomiting)   . Shortness of breath    "wheezing"  . Weakness of left arm 03/03/2020    Past Surgical History:  Procedure Laterality Date  . CESAREAN SECTION     x 2  . MICROLARYNGOSCOPY WITH DILATION N/A 03/13/2020   Procedure: MICROLARYNGOSCOPY WITH DILATION w/JET VENT MITOMYCIN C;  Surgeon: Christia ReadingBates, Dwight, MD;  [facility];  Service: ENT;  Laterality: N/A;  . MICROLARYNGOSCOPY WITH LASER AND BALLOON DILATION N/A 07/20/2012   Procedure: MICROLARYNGOSCOPY WITH LASER AND BALLOON DILATION;  Surgeon: Christia Readingwight Bates, MD;  Location: [facility] OR;  Service: ENT;  Laterality: N/A;  WITH JET VENTURI VENTILATION  . TONGUE FLAP RELEASE    . widom extractions      There were no vitals  filed for this visit.   Subjective Assessment - 04/21/20 1753    Subjective [name] reports things have been going "sad." Last monday she was sick with a bad cold last week and that's why she missed PT. Her arm started hurting again last Friday and she has been having interimttant tingling in the first three digits of the left hand and wrist pain on that side again. She has been trying not to use it as much. She helped wrap some presents on thursday before it got worse on Friday. She has done some lifing for some Christmas activities and a lot of use of the left hand. Rib pain has stayed okay but has started to come back so she started to take muscle relaxer again.    Pertinent History Patient is a [age] y.o. female who presents to outpatient physical therapy with a referral for medical diagnosis cervicalgia, cervical degenerative disc disease. This patient's chief complaints consist of increased neck pain and new left UE pain/weakness/paresthesia and left sided rib pain on top of chronic left neck pain radiating to the left face and headache, leading to the following functional deficits: difficulty using left UE for manual tasks, using the phone, work tasks, holding pressure on wounds (unable), holding things without dropping them, opening packages, pulling back a stopper in a syringe, holding a cup, taking  lids off, grooming, folding laundry, making the bed, anything that requires two hands and force, gardening, yardwork, raking, clipping, cutting, dressing, showering, sleeping. Relevant past medical history and comorbidities include diabetes (currently controlled), idiopathic subglottic tracheal stenosis (surgery coming up end of month, causes shortness of breath upon exertion), low back pain on left side, obesity, GERD, when she takes caffeine she gest palpitations, high blood pressure.  Patient denies hx of cancer, stroke, seizures, lung problem (besides trachea problem), major cardiac events, unexplained  weight loss, changes in bowel or bladder problems, new onset stumbling or dropping things apart from described below.    Limitations House hold activities;Other (comment);Lifting   usinghands and force, gardening, yardwork, raking, clipping, cutting, dressing, showering, sleeping.   Diagnostic tests C-spine MRI report 02/20/2020: "IMPRESSION:1. Minimal disc osteophyte complex with left uncovertebral spurringat C5-C6 resulting in mild left foraminal stenosis.2. No canal stenosis at any level."    Patient Stated Goals "to not have arm pain"    Currently in Pain? Yes    Pain Score 3     Pain Location Arm    Pain Orientation Left    Pain Descriptors / Indicators Tingling            TREATMENT: DOES have latex allergy  Therapeutic exercise:to centralize symptoms and improve ROM, strength, muscular endurance, and activity tolerance required for successful completion of functional activities. - seated thoracic extension over back of chair: with airex in seat2x10 - seated cervical retraction 2x10 - seated cervical retraction and extension (with rotation last 4 reps) 1x10. (paresthesia in thumb and index finger came and went).  - phalen's and reverse phalen's test: produced symptoms in R > L hands. Tingling in middle finger on left with reverse phalen's.  - L median nerve glide - slider technique x 10 - Education on HEP including handout   Manual therapy:to reduce pain and tissue tension, improve range of motion, neuromodulation, in order to promote improved ability to complete functional activities. - hooklying MDT retraction with clinician overpressure and distraction x 6 - hooklying MDT cervical retraction and extension with rotation and distraction x7 - hooklying STM to posterior cervical spine musculature focusing on L > R as tolerated. - hooklying passive upper limb neurodynamic tests. Stretching sensation but no reproduction of symptoms bilaterally with median, ulnar, and radial  bias. Did report left side seemed to have a stronger stretch than right.   HOME EXERCISE PROGRAM Access Code: [email or web address] URL: [email or web address] Date: 03/12/2020 Prepared by: Norton Blizzard  Exercises Seated Cervical Flexion AROM - 6 x daily - 1 sets - 10 reps                PT Education - 04/21/20 1801    Education Details Exercise purpose/form. Self management techniques    Person(s) Educated Patient    Methods Explanation;Demonstration;Tactile cues;Verbal cues    Comprehension Verbalized understanding;Returned demonstration;Verbal cues required;Tactile cues required;Need further instruction               PT Long Term Goals - 03/05/20 1919      PT LONG TERM GOAL #1   Title Be independent with a long-term home exercise program for self-management of symptoms.    Baseline initial HEP provided at IE (03/04/2020);    Time 12    Period Weeks    Status New   TARGET DATE FOR ALL LONG TERM GOALS: 05/27/2020     PT LONG TERM GOAL #2   Title Demonstrate improved FOTO score by 10 units to demonstrate  improvement in overall condition and self-reported functional ability.    Baseline deferred to visit 2 (03/04/2020);    Time 12    Period Weeks    Status New      PT LONG TERM GOAL #3   Title Improve left UE  strength to equal or greater than  4+/5 for improved ability to allow patient to complete valued functional tasks such as lifting, pressing, grasping, holding, etc,  with less difficulty.    Baseline wrist extension 3/5 and reports weakness with repetition (03/05/2020);    Time 12    Period Weeks    Status New      PT LONG TERM GOAL #4   Title Patient will report no longer having pain down the left arm to improve ability to use left UE for functional tasks such as grasping, pressing, stabilizing.    Baseline 8/10 (03/05/2020);    Time 12    Period Weeks    Status New      PT LONG TERM GOAL #5   Title Complete community, work and/or  recreational activities without limitation due to current condition.    Baseline Functional Limitations: using left UE for manual tasks, using the phone, work tasks, holding pressure on wounds (unable), holding things without dropping them, opening packages, pulling back a stopper in a syringe, holding a cup, taking lids off, grooming, folding laundry, making the bed, anything that requires two hands and force, gardening, yardwork, raking, clipping, cutting, dressing, showering, sleeping (03/04/2020);    Time 12    Period Weeks    Status New                 Plan - 04/21/20 1901    Clinical Impression Statement Patient reported exacerbation of symptoms upon arrival, but examination and treatment showed continued improved tolerance for extension and retraction and patient had minimal reproduction of symptoms at all.  She did report intermittent tingling at the left thumb and index finger with some cervical movements that improved with continued movement in seated retraction extension and rotation.  She was negative for reproduction of symptoms with upper limb neurodynamic testing and testing for carpal tunnel syndrome revealed more pain in the right hand than the left and tingling in the left hand only in the middle finger during reverse Phalen test.  It appears patient did have some regression with more symptoms during functional activity but is still much better than when she 1st started physical therapy and is continuing to respond to interventions. Patient would benefit from continued management of limiting condition by skilled physical therapist to address remaining impairments and functional limitations to work towards stated goals and return to PLOF or maximal functional independence.    Personal Factors and Comorbidities Age;Comorbidity 3+;Past/Current Experience;Fitness;Time since onset of injury/illness/exacerbation;Profession;Education    Comorbidities Relevant past medical history and  comorbidities include diabetes (currently controlled), idiopathic subglottic tracheal stenosis (surgery coming up end of month, causes shortness of breath upon exertion), low back pain on left side, obesity, GERD, when she takes caffeine she gest palpitations, high blood pressure.    Examination-Activity Limitations Bathing;Hygiene/Grooming;Lift;Caring for Others;Reach Overhead;Carry;Dressing;Sleep    Examination-Participation Restrictions Community Activity;Occupation;Yard Work;Meal Prep;Laundry;Cleaning;Interpersonal Relationship;Other   using left UE for manual tasks, using the phone, holding pressure on wounds (unable), holding things without dropping them, opening packages, pulling back a stopper in a syringe, holding a cup, taking lids off, making the bed   Stability/Clinical Decision Making Evolving/Moderate complexity    Rehab Potential Good  PT Frequency 2x / week    PT Duration 12 weeks    PT Treatment/Interventions Moist Heat;Therapeutic exercise;Manual techniques;Dry needling;Electrical Stimulation;ADLs/Self Care Home Management;Cryotherapy;Traction;Therapeutic activities;Neuromuscular re-education;Patient/family education;Passive range of motion;Taping;Joint Manipulations;Spinal Manipulations    PT Next Visit Plan manual therapy, exercise as tolerated    PT Home Exercise Plan thoracic extension, cervical spine retraction extension rotation, median nerve glide    Consulted and Agree with Plan of Care Patient           Patient will benefit from skilled therapeutic intervention in order to improve the following deficits and impairments:  Pain, Decreased activity tolerance, Impaired sensation, Improper body mechanics, Postural dysfunction, Increased muscle spasms, Decreased coordination, Impaired tone, Decreased endurance, Decreased range of motion, Decreased strength, Impaired perceived functional ability, Obesity, Impaired UE functional use  Visit Diagnosis: Radiculopathy, cervical  region  Cervicalgia  Nonintractable headache, unspecified chronicity pattern, unspecified headache type  Pain in thoracic spine     Problem List Patient Active Problem List   Diagnosis Date Noted  . Cervical radiculitis 03/03/2020  . Weakness of left arm 03/03/2020  . Cervicalgia 02/28/2020  . Spondylosis, cervical, 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  . Acute URI 12/04/2019  . Acute maxillary sinusitis 12/04/2019  . Loud snoring 08/01/2019  . B12 deficiency 08/01/2019  . Eczema of left external ear 08/01/2019  . Hyperlipidemia associated with type 2 diabetes mellitus (HCC) 07/31/2019  . Nonallopathic lesion of cervical region 04/29/2019  . Nonallopathic lesion of thoracic region 04/29/2019  . Nonallopathic lesion of rib cage 04/29/2019  . Cervical radiculopathy at C7 03/26/2019  . Abnormal breast exam 01/24/2019  . Elevated ALT measurement 01/24/2019  . Allergic rhinitis due to pollen 08/18/2018  . Abnormal mammogram of right breast 02/27/2018  . Dermatitis 05/02/2017  . Type 2 diabetes mellitus without complications (HCC) 05/02/2017  . Idiopathic subglottic tracheal stenosis 06/21/2016  . Encounter for general adult medical examination with abnormal findings 04/29/2016  . Low back pain without sciatica 04/28/2015  . Class 3 severe obesity without serious comorbidity with body mass index (BMI) of 40.0 to 44.9 in adult (HCC) 04/28/2015  . Pre-syncope 07/14/2014    Luretha Murphy. Ilsa Iha, PT, DPT 04/21/20, 7:03 PM  [facility] PHYSICAL AND SPORTS MEDICINE [address] [place], [address] Phone: [phone]   Fax:  [phone]  Name: SRUTI AYLLON MRN: [id number] Date of Birth: 05-07-1976

## 2020-04-23 ENCOUNTER — Other Ambulatory Visit: Payer: Self-pay

## 2020-04-23 ENCOUNTER — Ambulatory Visit: Payer: No Typology Code available for payment source | Admitting: Physical Therapy

## 2020-04-23 ENCOUNTER — Encounter: Payer: Self-pay | Admitting: Physical Therapy

## 2020-04-23 DIAGNOSIS — M5412 Radiculopathy, cervical region: Secondary | ICD-10-CM | POA: Diagnosis not present

## 2020-04-23 DIAGNOSIS — M546 Pain in thoracic spine: Secondary | ICD-10-CM

## 2020-04-23 DIAGNOSIS — M542 Cervicalgia: Secondary | ICD-10-CM

## 2020-04-23 DIAGNOSIS — R519 Headache, unspecified: Secondary | ICD-10-CM

## 2020-04-23 NOTE — Therapy (Signed)
[facility] PHYSICAL AND SPOR[facility] MEDICINE [address], [place], [address] Phone: [phone]   Fax:  [phone]  Physical Therapy Treatment  Patient Details  Name: Lisa [name] MRN: [id number] Date of Birth: 03/10/1976 Referring Provider (PT): Yevette Edwards, MD (pain clinic)   Encounter Date: 04/23/2020   PT End of Session - 04/23/20 1838    Visit Number 9    Number of Visits 24    Date for PT Re-Evaluation 05/27/20    Authorization Type [place] [employer] reporting period from 03/04/2020    Progress Note Due on Visit 10    PT Start Time 1810    PT Stop Time 1850    PT Time Calculation (min) 40 min    Activity Tolerance Patient tolerated treatment well    Behavior During Therapy [facility] for tasks assessed/performed           Past Medical History:  Diagnosis Date  . Bronchitis    hx of  . Cervical radiculitis 03/03/2020  . Complication of anesthesia   . DDD (degenerative disc disease), cervical 02/28/2020  . Dysrhythmia    "does not see cardiologist"  . Family history of anesthesia complication    "morphine night terrors"  . GERD (gastroesophageal reflux disease)   . Headache(784.0)    "migraines"  . Hypertension   . PONV (postoperative nausea and vomiting)   . Shortness of breath    "wheezing"  . Weakness of left arm 03/03/2020    Past Surgical History:  Procedure Laterality Date  . CESAREAN SECTION     x 2  . MICROLARYNGOSCOPY WITH DILATION N/A 03/13/2020   Procedure: MICROLARYNGOSCOPY WITH DILATION w/JET VENT MITOMYCIN C;  Surgeon: Christia Reading, MD;  Location: [facility] OR;  Service: ENT;  Laterality: N/A;  . MICROLARYNGOSCOPY WITH LASER AND BALLOON DILATION N/A 07/20/2012   Procedure: MICROLARYNGOSCOPY WITH LASER AND BALLOON DILATION;  Surgeon: Christia Reading, MD;  Location: [facility] OR;  Service: ENT;  Laterality: N/A;  WITH JET VENTURI VENTILATION  . TONGUE FLAP RELEASE    . widom extractions      There were no vitals  filed for this visit.   Subjective Assessment - 04/23/20 1811    Subjective Patient reports she was sore following last treatment session. She did not take anything today but her arm is "worrisome" and she cannot hold things as much as she wants. She didn't have to take anything though. Reports pain 3/10 in left forearm to the wrist and hand. Not really tingling.    Pertinent History Patient is a 44 y.o. female who presents to outpatient physical therapy with a referral for medical diagnosis cervicalgia, cervical degenerative disc disease. This patient's chief complaints consist of increased neck pain and new left UE pain/weakness/paresthesia and left sided rib pain on top of chronic left neck pain radiating to the left face and headache, leading to the following functional deficits: difficulty using left UE for manual tasks, using the phone, work tasks, holding pressure on wounds (unable), holding things without dropping them, opening packages, pulling back a stopper in a syringe, holding a cup, taking lids off, grooming, folding laundry, making the bed, anything that requires two hands and force, gardening, yardwork, raking, clipping, cutting, dressing, showering, sleeping. Relevant past medical history and comorbidities include diabetes (currently controlled), idiopathic subglottic tracheal stenosis (surgery coming up end of month, causes shortness of breath upon exertion), low back pain on left side, obesity, GERD, when she takes caffeine she gest  palpitations, high blood pressure.  Patient denies hx of cancer, stroke, seizures, lung problem (besides trachea problem), major cardiac events, unexplained weight loss, changes in bowel or bladder problems, new onset stumbling or dropping things apart from described below.    Limitations House hold activities;Other (comment);Lifting   usinghands and force, gardening, yardwork, raking, clipping, cutting, dressing, showering, sleeping.   Diagnostic tests C-spine  MRI report 02/20/2020: "IMPRESSION:1. Minimal disc osteophyte complex with left uncovertebral spurringat C5-C6 resulting in mild left foraminal stenosis.2. No canal stenosis at any level."    Patient Stated Goals "to not have arm pain"    Currently in Pain? Yes    Pain Score 3            TREATMENT: DOES have latex allergy  Therapeutic exercise:to centralize symptoms and improve ROM, strength, muscular endurance, and activity tolerance required for successful completion of functional activities. - hooklying B pec minor stretch with half foam roll along spine, 2x1 min - hooklying angels while laying on half foam roll, x20  Circuit:  - hooklying B horizontal shoulder abduction, 3x10 with green theraband.  - hooklying D2 shoulder flexion with green theraband, 3x10 each direction.  Manual therapy:to reduce pain and tissue tension, improve range of motion, neuromodulation, in order to promote improved ability to complete functional activities. - hooklying MDT retraction with clinician overpressure and distraction x 6 - hooklying MDT cervical retraction and extension with rotation and distraction x6 - hooklying manual cervical spine distraction with and without rotation to the left at varying degrees.  - hooklying STM to posterior cervical spine musculature focusing on L > R as tolerated.  HOME EXERCISE PROGRAM Access Code: [email or web address] URL: [email or web address] Date: 03/12/2020 Prepared by: Norton Blizzard  Exercises Seated Cervical Flexion AROM - 6 x daily - 1 sets - 10 reps              PT Education - 04/23/20 1837    Education Details Exercise purpose/form. Self management techniques    Person(s) Educated Patient    Methods Explanation;Demonstration;Tactile cues;Verbal cues    Comprehension Verbalized understanding;Returned demonstration;Verbal cues required;Tactile cues required;Need further instruction               PT Long Term Goals -  03/05/20 1919      PT LONG TERM GOAL #1   Title Be independent with a long-term home exercise program for self-management of symptoms.    Baseline initial HEP provided at IE (03/04/2020);    Time 12    Period Weeks    Status New   TARGET DATE FOR ALL LONG TERM GOALS: 05/27/2020     PT LONG TERM GOAL #2   Title Demonstrate improved FOTO score by 10 units to demonstrate improvement in overall condition and self-reported functional ability.    Baseline deferred to visit 2 (03/04/2020);    Time 12    Period Weeks    Status New      PT LONG TERM GOAL #3   Title Improve left UE  strength to equal or greater than  4+/5 for improved ability to allow patient to complete valued functional tasks such as lifting, pressing, grasping, holding, etc,  with less difficulty.    Baseline wrist extension 3/5 and reports weakness with repetition (03/05/2020);    Time 12    Period Weeks    Status New      PT LONG TERM GOAL #4   Title Patient will report no longer having pain down the left arm to improve  ability to use left UE for functional tasks such as grasping, pressing, stabilizing.    Baseline 8/10 (03/05/2020);    Time 12    Period Weeks    Status New      PT LONG TERM GOAL #5   Title Complete community, work and/or recreational activities without limitation due to current condition.    Baseline Functional Limitations: using left UE for manual tasks, using the phone, work tasks, holding pressure on wounds (unable), holding things without dropping them, opening packages, pulling back a stopper in a syringe, holding a cup, taking lids off, grooming, folding laundry, making the bed, anything that requires two hands and force, gardening, yardwork, raking, clipping, cutting, dressing, showering, sleeping (03/04/2020);    Time 12    Period Weeks    Status New             Plan - 04/23/20 1845    Clinical Impression Statement Patient tolerated treatment well overall with intermittent symptoms  throughout session that were mostly paresthesia at the left UE. She did not have any dramatic better or worse response to manual therapy and tends to have overall improved ability to complete more motion and grip with less pain than in the past. Continues to be bothered by pain, paresthesia, and weakness in the left UE that affects her work and daily activities. Patient would benefit from continued management of limiting condition by skilled physical therapist to address remaining impairments and functional limitations to work towards stated goals and return to PLOF or maximal functional independence.    Personal Factors and Comorbidities Age;Comorbidity 3+;Past/Current Experience;Fitness;Time since onset of injury/illness/exacerbation;Profession;Education    Comorbidities Relevant past medical history and comorbidities include diabetes (currently controlled), idiopathic subglottic tracheal stenosis (surgery coming up end of month, causes shortness of breath upon exertion), low back pain on left side, obesity, GERD, when she takes caffeine she gest palpitations, high blood pressure.    Examination-Activity Limitations Bathing;Hygiene/Grooming;Lift;Caring for Others;Reach Overhead;Carry;Dressing;Sleep    Examination-Participation Restrictions Community Activity;Occupation;Yard Work;Meal Prep;Laundry;Cleaning;Interpersonal Relationship;Other   using left UE for manual tasks, using the phone, holding pressure on wounds (unable), holding things without dropping them, opening packages, pulling back a stopper in a syringe, holding a cup, taking lids off, making the bed   Stability/Clinical Decision Making Evolving/Moderate complexity    Rehab Potential Good    PT Frequency 2x / week    PT Duration 12 weeks    PT Treatment/Interventions Moist Heat;Therapeutic exercise;Manual techniques;Dry needling;Electrical Stimulation;ADLs/Self Care Home Management;Cryotherapy;Traction;Therapeutic activities;Neuromuscular  re-education;Patient/family education;Passive range of motion;Taping;Joint Manipulations;Spinal Manipulations    PT Next Visit Plan manual therapy, exercise as tolerated    PT Home Exercise Plan thoracic extension, cervical spine retraction extension rotation, median nerve glide    Consulted and Agree with Plan of Care Patient           Patient will benefit from skilled therapeutic intervention in order to improve the following deficits and impairments:  Pain,Decreased activity tolerance,Impaired sensation,Improper body mechanics,Postural dysfunction,Increased muscle spasms,Decreased coordination,Impaired tone,Decreased endurance,Decreased range of motion,Decreased strength,Impaired perceived functional ability,Obesity,Impaired UE functional use  Visit Diagnosis: Radiculopathy, cervical region  Cervicalgia  Nonintractable headache, unspecified chronicity pattern, unspecified headache type  Pain in thoracic spine     Problem List Patient Active Problem List   Diagnosis Date Noted  . Cervical radiculitis 03/03/2020  . Weakness of left arm 03/03/2020  . Cervicalgia 02/28/2020  . Spondylosis, cervical, 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  . Acute URI 12/04/2019  . Acute maxillary sinusitis 12/04/2019  . Loud snoring  08/01/2019  . B12 deficiency 08/01/2019  . Eczema of left external ear 08/01/2019  . Hyperlipidemia associated with type 2 diabetes mellitus (HCC) 07/31/2019  . Nonallopathic lesion of cervical region 04/29/2019  . Nonallopathic lesion of thoracic region 04/29/2019  . Nonallopathic lesion of rib cage 04/29/2019  . Cervical radiculopathy at C7 03/26/2019  . Abnormal breast exam 01/24/2019  . Elevated ALT measurement 01/24/2019  . Allergic rhinitis due to pollen 08/18/2018  . Abnormal mammogram of right breast 02/27/2018  . Dermatitis 05/02/2017  . Type 2 diabetes mellitus without complications (HCC) 05/02/2017  . Idiopathic subglottic tracheal stenosis 06/21/2016  .  Encounter for general adult medical examination with abnormal findings 04/29/2016  . Low back pain without sciatica 04/28/2015  . Class 3 severe obesity without serious comorbidity with body mass index (BMI) of 40.0 to 44.9 in adult (HCC) 04/28/2015  . Pre-syncope 07/14/2014    Luretha Murphy. Ilsa Iha, PT, DPT 04/23/20, 6:50 PM  [facility] PHYSICAL AND SPORTS MEDICINE [address] [place], [address] Phone: [phone]   Fax:  [phone]  Name: MECCA GUITRON MRN: [id number] Date of Birth: 06/06/1975

## 2020-04-27 ENCOUNTER — Ambulatory Visit: Payer: No Typology Code available for payment source | Admitting: Physical Therapy

## 2020-04-27 ENCOUNTER — Encounter: Payer: Self-pay | Admitting: Physical Therapy

## 2020-04-27 ENCOUNTER — Other Ambulatory Visit: Payer: Self-pay

## 2020-04-27 DIAGNOSIS — M5412 Radiculopathy, cervical region: Secondary | ICD-10-CM | POA: Diagnosis not present

## 2020-04-27 DIAGNOSIS — R519 Headache, unspecified: Secondary | ICD-10-CM

## 2020-04-27 DIAGNOSIS — M546 Pain in thoracic spine: Secondary | ICD-10-CM

## 2020-04-27 DIAGNOSIS — M542 Cervicalgia: Secondary | ICD-10-CM

## 2020-04-27 NOTE — Therapy (Signed)
[facility] [address], [place], [address] Phone: [phone]   Fax:  [phone]  Physical Therapy Treatment / Progress Note Reporting period: 03/04/2020 - 04/27/2020  Patient Details  Name: Lisa [name] MRN: [id number] Date of Birth: 1975/10/29 Referring Provider (PT): Molli Barrows, MD (pain clinic)   Encounter Date: 04/27/2020   PT End of Session - 04/27/20 1716    Visit Number 10    Number of Visits 24    Date for PT Re-Evaluation 05/27/20    Authorization Type [employer] reporting period from 03/04/2020    Progress Note Due on Visit 10    PT Start Time 1653    PT Stop Time 1731    PT Time Calculation (min) 38 min    Activity Tolerance Patient tolerated treatment well    Behavior During Therapy [facility] for tasks assessed/performed           Past Medical History:  Diagnosis Date   Bronchitis    hx of   Cervical radiculitis 97/67/3419   Complication of anesthesia    DDD (degenerative disc disease), cervical 02/28/2020   Dysrhythmia    "does not see cardiologist"   Family history of anesthesia complication    "morphine night terrors"   GERD (gastroesophageal reflux disease)    Headache(784.0)    "migraines"   Hypertension    PONV (postoperative nausea and vomiting)    Shortness of breath    "wheezing"   Weakness of left arm 03/03/2020    Past Surgical History:  Procedure Laterality Date   CESAREAN SECTION     x 2   MICROLARYNGOSCOPY WITH DILATION N/A 03/13/2020   Procedure: MICROLARYNGOSCOPY WITH DILATION w/JET VENT MITOMYCIN C;  Surgeon: Melida Quitter, MD;  Location: [place];  Service: ENT;  Laterality: N/A;   MICROLARYNGOSCOPY WITH LASER AND BALLOON DILATION N/A 07/20/2012   Procedure: MICROLARYNGOSCOPY WITH LASER AND BALLOON DILATION;  Surgeon: Melida Quitter, MD;  Location: [facility];  Service: ENT;  Laterality: N/A;  WITH JET VENTURI VENTILATION   TONGUE FLAP  RELEASE     widom extractions      There were no vitals filed for this visit.   Subjective Assessment - 04/27/20 1706    Subjective Patient reports she is feeling better after last treatment session. She is not having any tingling today but does have some soreness at the left upper trap and left hand. Felt better after last PT session, then saw her massage therapist who remembered a muscle in her axillary region that she worked on some and it reproduced the pain but she felt better later on. She has been using her arm a lot since then without increased pain or symptoms. Feels like physical therapy and massage is helping her.    Pertinent History Patient is a 44 y.o. female who presents to outpatient physical therapy with a referral for medical diagnosis cervicalgia, cervical degenerative disc disease. This patient's chief complaints consist of increased neck pain and new left UE pain/weakness/paresthesia and left sided rib pain on top of chronic left neck pain radiating to the left face and headache, leading to the following functional deficits: difficulty using left UE for manual tasks, using the phone, work tasks, holding pressure on wounds (unable), holding things without dropping them, opening packages, pulling back a stopper in a syringe, holding a cup, taking lids off, grooming, folding laundry, making the bed, anything that requires two hands and force, gardening, yardwork,  raking, clipping, cutting, dressing, showering, sleeping. Relevant past medical history and comorbidities include diabetes (currently controlled), idiopathic subglottic tracheal stenosis (surgery coming up end of month, causes shortness of breath upon exertion), low back pain on left side, obesity, GERD, when she takes caffeine she gest palpitations, high blood pressure.  Patient denies hx of cancer, stroke, seizures, lung problem (besides trachea problem), major cardiac events, unexplained weight loss, changes in bowel or bladder  problems, new onset stumbling or dropping things apart from described below.    Limitations House hold activities;Other (comment);Lifting   usinghands and force, gardening, yardwork, raking, clipping, cutting, dressing, showering, sleeping.   Diagnostic tests C-spine MRI report 02/20/2020: "IMPRESSION:1. Minimal disc osteophyte complex with left uncovertebral spurringat C5-C6 resulting in mild left foraminal stenosis.2. No canal stenosis at any level."    Patient Stated Goals "to not have arm pain"    Currently in Pain? Yes    Pain Score 1     Pain Location Arm    Pain Orientation Left    Effect of Pain on Daily Activities Reports all of the difficulties listed previously are better but her left hand gets tired and she tries to switch to the other hand as available. Still has difficulty putting arm behind back to button bra or put on jacket.            OBJECTIVE  FOTO = 69   SPINE MOTION Cervical Spine AROM *Indicates pain Flexion: = 50. Extension: = 60. Rotation: R= 65, L = 65  Side Flexion: R= 40,  L = 40 increased pain in wrist Protrusion =  WNL Retraction = mild limitation, soreness at back of neck.  MUSCLE PERFORMANCE (MMT):  *Indicates pain 03/05/20 04/27/20 Date  Joint/Motion R/L R/L R/L  Shoulder     Flexion 4+/4 4+/4+ /  Abduction 5/4+ 5/4+ /  External rotation 4/4+ 4+/4+ /  Internal rotation 4+/4+ 4+/4+ /  Extension / / /  Elbow     Flexion 4+/4+ 5/4+ /  Extension 4+/4+ 4+/4+ /  Wrist     Flexion 4+/4+ 4+/4* /  Extension 5/3 5/4+ /  Comments:  03/04/20: grip slightly weaker left compared to right  PALPATION: - TTP at left subscapularis muscle, without strong reproduction of symptoms. .  Objective measurements completed on examination: See above findings.     TREATMENT: DOES have latex allergy  Therapeutic exercise:to centralize symptoms and improve ROM, strength, muscular endurance, and activity tolerance required for successful  completion of functional activities. - seated thoracic extension over back of chair x 20 - hooklying angels while laying on half foam roll, x10  Circuit:  - hooklying B horizontal shoulder abduction,3x10 with green theraband.  - hooklying D2 shoulder flexion with green theraband,3x10 each direction.  - measurements to assess progress (see above).   HOME EXERCISE PROGRAM Access Code: [email or web address] URL: [email or web address] Date: 03/12/2020 Prepared by: Rosita Kea  Exercises Seated Cervical Flexion AROM - 6 x daily - 1 sets - 10 reps           PT Education - 04/27/20 1716    Education Details Exercise purpose/form. Self management techniques    Person(s) Educated Patient    Methods Explanation;Demonstration;Tactile cues;Verbal cues    Comprehension Verbalized understanding;Returned demonstration;Verbal cues required;Tactile cues required;Need further instruction               PT Long Term Goals - 04/27/20 1712      PT LONG TERM GOAL #1   Title Be independent with a  long-term home exercise program for self-management of symptoms.    Baseline initial HEP provided at IE (03/04/2020); currently participating in appropriate HEP (04/27/2020);    Time 12    Period Weeks    Status Partially Met   TARGET DATE FOR ALL LONG TERM GOALS: 05/27/2020     PT LONG TERM GOAL #2   Title Demonstrate improved FOTO score by 10 units to demonstrate improvement in overall condition and self-reported functional ability.    Baseline deferred to visit 2 (03/04/2020); 54 at visit 2 (03/10/2020); 69 at visit 10 (04/27/2020);    Time 12    Period Weeks    Status Achieved      PT LONG TERM GOAL #3   Title Improve left UE  strength to equal or greater than  4+/5 for improved ability to allow patient to complete valued functional tasks such as lifting, pressing, grasping, holding, etc,  with less difficulty.    Baseline wrist extension 3/5 and reports weakness with  repetition (03/05/2020); nearly met - lacks L wrist flexion (04/27/2020);    Time 12    Period Weeks    Status Partially Met      PT LONG TERM GOAL #4   Title Patient will report no longer having pain down the left arm to improve ability to use left UE for functional tasks such as grasping, pressing, stabilizing.    Baseline 8/10 (03/05/2020); 4/10 highest in the last two weeks (04/27/2020);    Time 12    Period Weeks    Status Partially Met      PT LONG TERM GOAL #5   Title Complete community, work and/or recreational activities without limitation due to current condition.    Baseline Functional Limitations: using left UE for manual tasks, using the phone, work tasks, holding pressure on wounds (unable), holding things without dropping them, opening packages, pulling back a stopper in a syringe, holding a cup, taking lids off, grooming, folding laundry, making the bed, anything that requires two hands and force, gardening, yardwork, raking, clipping, cutting, dressing, showering, sleeping (03/04/2020); improved with all task but still gets tired early in the left UE and switches hands if able - also still cannot button bra and has difficulty putting on a jacket (04/27/2020);    Time 12    Period Weeks    Status Partially Met                 Plan - 04/27/20 2013    Clinical Impression Statement Patient has made good progress towards goals and has met FOTO goal reflecting significant improvement in self-reported function. Although strength and pain have improved, patient does still have limitations from these and has not yet shown stability in improvement. Patient would benefit from continued management of limiting condition by skilled physical therapist to address remaining impairments and functional limitations to work towards stated goals and return to PLOF or maximal functional independence.    Personal Factors and Comorbidities Age;Comorbidity 3+;Past/Current Experience;Fitness;Time  since onset of injury/illness/exacerbation;Profession;Education    Comorbidities Relevant past medical history and comorbidities include diabetes (currently controlled), idiopathic subglottic tracheal stenosis (surgery coming up end of month, causes shortness of breath upon exertion), low back pain on left side, obesity, GERD, when she takes caffeine she gest palpitations, high blood pressure.    Examination-Activity Limitations Bathing;Hygiene/Grooming;Lift;Caring for Others;Reach Overhead;Carry;Dressing;Sleep    Examination-Participation Restrictions Community Activity;Occupation;Yard Work;Meal Prep;Laundry;Cleaning;Interpersonal Relationship;Other   using left UE for manual tasks, using the phone, holding pressure on wounds (unable),  holding things without dropping them, opening packages, pulling back a stopper in a syringe, holding a cup, taking lids off, making the bed   Stability/Clinical Decision Making Evolving/Moderate complexity    Rehab Potential Good    PT Frequency 2x / week    PT Duration 12 weeks    PT Treatment/Interventions Moist Heat;Therapeutic exercise;Manual techniques;Dry needling;Electrical Stimulation;ADLs/Self Care Home Management;Cryotherapy;Traction;Therapeutic activities;Neuromuscular re-education;Patient/family education;Passive range of motion;Taping;Joint Manipulations;Spinal Manipulations    PT Next Visit Plan manual therapy, exercise as tolerated    PT Home Exercise Plan thoracic extension, cervical spine retraction extension rotation, median nerve glide    Consulted and Agree with Plan of Care Patient           Patient will benefit from skilled therapeutic intervention in order to improve the following deficits and impairments:  Pain,Decreased activity tolerance,Impaired sensation,Improper body mechanics,Postural dysfunction,Increased muscle spasms,Decreased coordination,Impaired tone,Decreased endurance,Decreased range of motion,Decreased strength,Impaired  perceived functional ability,Obesity,Impaired UE functional use  Visit Diagnosis: Radiculopathy, cervical region  Cervicalgia  Nonintractable headache, unspecified chronicity pattern, unspecified headache type  Pain in thoracic spine     Problem List Patient Active Problem List   Diagnosis Date Noted   Cervical radiculitis 03/03/2020   Weakness of left arm 03/03/2020   Cervicalgia 02/28/2020   Spondylosis, cervical, 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   Acute URI 12/04/2019   Acute maxillary sinusitis 12/04/2019   Loud snoring 08/01/2019   B12 deficiency 08/01/2019   Eczema of left external ear 08/01/2019   Hyperlipidemia associated with type 2 diabetes mellitus ([place]) 07/31/2019   Nonallopathic lesion of cervical region 04/29/2019   Nonallopathic lesion of thoracic region 04/29/2019   Nonallopathic lesion of rib cage 04/29/2019   Cervical radiculopathy at C7 03/26/2019   Abnormal breast exam 01/24/2019   Elevated ALT measurement 01/24/2019   Allergic rhinitis due to pollen 08/18/2018   Abnormal mammogram of right breast 02/27/2018   Dermatitis 05/02/2017   Type 2 diabetes mellitus without complications ([place]) 46/00/2984   Idiopathic subglottic tracheal stenosis 06/21/2016   Encounter for general adult medical examination with abnormal findings 04/29/2016   Low back pain without sciatica 04/28/2015   Class 3 severe obesity without serious comorbidity with body mass index (BMI) of 40.0 to 44.9 in adult [facility]) 04/28/2015   Pre-syncope 07/14/2014    Everlean Alstrom. Graylon Good, PT, DPT 04/27/20, 8:14 PM  [facility] [address] [place], [address] Phone: [phone]   Fax:  [phone]  Name: Lisa [name] MRN: [id number] Date of Birth: Jan 11, 1976

## 2020-04-29 ENCOUNTER — Ambulatory Visit: Payer: No Typology Code available for payment source | Admitting: Physical Therapy

## 2020-04-29 ENCOUNTER — Encounter: Payer: Self-pay | Admitting: Family Medicine

## 2020-04-29 ENCOUNTER — Ambulatory Visit (INDEPENDENT_AMBULATORY_CARE_PROVIDER_SITE_OTHER): Payer: No Typology Code available for payment source | Admitting: Family Medicine

## 2020-04-29 ENCOUNTER — Other Ambulatory Visit: Payer: Self-pay

## 2020-04-29 ENCOUNTER — Encounter: Payer: Self-pay | Admitting: Physical Therapy

## 2020-04-29 ENCOUNTER — Other Ambulatory Visit: Payer: Self-pay | Admitting: Family Medicine

## 2020-04-29 DIAGNOSIS — M542 Cervicalgia: Secondary | ICD-10-CM

## 2020-04-29 DIAGNOSIS — M5412 Radiculopathy, cervical region: Secondary | ICD-10-CM

## 2020-04-29 DIAGNOSIS — M546 Pain in thoracic spine: Secondary | ICD-10-CM

## 2020-04-29 DIAGNOSIS — R519 Headache, unspecified: Secondary | ICD-10-CM

## 2020-04-29 MED ORDER — TIZANIDINE HCL 4 MG PO TABS: 4.0000 mg | ORAL_TABLET | Freq: Every day | ORAL | 0 refills | Status: DC

## 2020-04-29 NOTE — Progress Notes (Signed)
[name] [address] [place] [address] Phone: [phone] Subjective:   Lisa [name], am serving as a scribe for Dr. Antoine Primas. This visit occurred during the SARS-CoV-2 public health emergency.  Safety protocols were in place, including screening questions prior to the visit, additional usage of staff PPE, and extensive cleaning of exam room while observing appropriate contact time as indicated for disinfecting solutions.   I'm seeing this patient by the request  of:  Lisa Shams, MD  CC: Neck pain follow-up  [id number]  [name] is a 44 y.o. female coming in with complaint of back and neck pain. OMT 03/03/2020. Patient states that tingling and pain in left arm started to increase a couple weeks ago. Symptoms have improved since then. Has been doing physical therapy and is curious if she needs to continue. Does feel progress but is not 100%.  Patient as well as physical therapist were concerned because of the recurrent radicular symptoms.   Medications patient has been prescribed: Gabapentin  Taking:         Reviewed prior external information including notes and imaging from previsou exam, outside providers and external EMR if available.   As well as notes that were available from care everywhere and other healthcare systems.  Past medical history, social, surgical and family history all reviewed in electronic medical record.  No pertanent information unless stated regarding to the chief complaint.   Past Medical History:  Diagnosis Date  . Bronchitis    hx of  . Cervical radiculitis 03/03/2020  . Complication of anesthesia   . DDD (degenerative disc disease), cervical 02/28/2020  . Dysrhythmia    "does not see cardiologist"  . Family history of anesthesia complication    "morphine night terrors"  . GERD (gastroesophageal reflux disease)   . Headache(784.0)    "migraines"  . Hypertension   . PONV  (postoperative nausea and vomiting)   . Shortness of breath    "wheezing"  . Weakness of left arm 03/03/2020    Allergies  Allergen Reactions  . Latex Hives    Rash and hives     Review of Systems:  No headache, visual changes, nausea, vomiting, diarrhea, constipation, dizziness, abdominal pain, skin rash, fevers, chills, night sweats, weight loss, swollen lymph nodes, body aches, joint swelling, chest pain, shortness of breath, mood changes. POSITIVE muscle aches  Objective  There were no vitals taken for this visit.   General: No apparent distress alert and oriented x3 mood and affect normal, dressed appropriately.  HEENT: Pupils equal, extraocular movements intact  Respiratory: Patient's speak in full sentences and does not appear short of breath  Cardiovascular: No lower extremity edema, non tender, no erythema  Gait normal with good balance and coordination.  Patient's wrist exam shows a negative Tinel's.  Negative Phalen's.  Good grip strength.  Patient has significant improvement in strength compared to previous exam.  Neck exam still shows some mild limited sidebending to the right some mild limited to the left rotation.  Although patient is significantly improved from previous exam.  No crepitus.  Negative Spurling's today.     Assessment and Plan:  Cervical radiculopathy at C7 Patient is doing significantly better.  Patient believes getting a little history of that could be more concerning for some possible carpal tunnel as well.  Discussed with patient in great length about this.  Patient wants to continue with the formal physical therapy.  Patient will continue with  the Zanaflex and the gabapentin.  Patient has made significant improvement and no longer has any weakness which is phenomenal.  I believe patient is at least 90% better at this point and is getting very close to 100%.  Next step would be normal nerve conduction study if necessary.  Patient will follow up again  in 8 weeks        The above documentation has been reviewed and is accurate and complete Wilford Grist       Note: This dictation was prepared with Dragon dictation along with smaller phrase technology. Any transcriptional errors that result from this process are unintentional.

## 2020-04-29 NOTE — Assessment & Plan Note (Signed)
Patient is doing significantly better.  Patient believes getting a little history of that could be more concerning for some possible carpal tunnel as well.  Discussed with patient in great length about this.  Patient wants to continue with the formal physical therapy.  Patient will continue with the Zanaflex and the gabapentin.  Patient has made significant improvement and no longer has any weakness which is phenomenal.  I believe patient is at least 90% better at this point and is getting very close to 100%.  Next step would be normal nerve conduction study if necessary.  Patient will follow up again in 8 weeks

## 2020-04-29 NOTE — Patient Instructions (Signed)
Zanaflex refill Continue gabapentin for now If you want to discontinue due the AM dose first Continue PT for now Send a message in 3 weeks and tell us if you want to do EMG Otherwise see me in 8 weeks

## 2020-04-29 NOTE — Therapy (Signed)
[facility] [address] [place], [address] Phone: [phone]   Fax:  [phone]  Physical Therapy Treatment  Patient Details  Name: Lisa [name] MRN: [id number] Date of Birth: [birth year] Referring Provider (PT): Molli Barrows, MD (pain clinic)   Encounter Date: 04/29/2020   PT End of Session - 04/29/20 1608    Visit Number 11    Number of Visits 24    Date for PT Re-Evaluation 05/27/20    Authorization Type [place] [employer] reporting period from 04/29/2020    Progress Note Due on Visit 20    PT Start Time 1515    PT Stop Time 1600    PT Time Calculation (min) 45 min    Activity Tolerance Patient tolerated treatment well    Behavior During Therapy [facility] for tasks assessed/performed           Past Medical History:  Diagnosis Date  . Bronchitis    hx of  . Cervical radiculitis 03/03/2020  . Complication of anesthesia   . DDD (degenerative disc disease), cervical 02/28/2020  . Dysrhythmia    "does not see cardiologist"  . Family history of anesthesia complication    "morphine night terrors"  . GERD (gastroesophageal reflux disease)   . Headache(784.0)    "migraines"  . Hypertension   . PONV (postoperative nausea and vomiting)   . Shortness of breath    "wheezing"  . Weakness of left arm 03/03/2020    Past Surgical History:  Procedure Laterality Date  . CESAREAN SECTION     x 2  . MICROLARYNGOSCOPY WITH DILATION N/A 03/13/2020   Procedure: MICROLARYNGOSCOPY WITH DILATION w/JET VENT MITOMYCIN C;  Surgeon: Melida Quitter, MD;  Location: [facility];  Service: ENT;  Laterality: N/A;  . MICROLARYNGOSCOPY WITH LASER AND BALLOON DILATION N/A 07/20/2012   Procedure: MICROLARYNGOSCOPY WITH LASER AND BALLOON DILATION;  Surgeon: Melida Quitter, MD;  Location: [facility];  Service: ENT;  Laterality: N/A;  WITH JET VENTURI VENTILATION  . TONGUE FLAP RELEASE    . widom extractions      There were no vitals  filed for this visit.   Subjective Assessment - 04/29/20 1517    Subjective [name] reports she is having some stiffness and pain over her left upper trap and around the wrist and forearm region, rated 2/10. She is also having a little bit of eye pain too. She saw Dr. Tamala Julian today who felt she is getting better and wanted her to continue PT.    Pertinent History Patient is a 44 y.o. female who presents to outpatient physical therapy with a referral for medical diagnosis cervicalgia, cervical degenerative disc disease. This patient's chief complaints consist of increased neck pain and new left UE pain/weakness/paresthesia and left sided rib pain on top of chronic left neck pain radiating to the left face and headache, leading to the following functional deficits: difficulty using left UE for manual tasks, using the phone, work tasks, holding pressure on wounds (unable), holding things without dropping them, opening packages, pulling back a stopper in a syringe, holding a cup, taking lids off, grooming, folding laundry, making the bed, anything that requires two hands and force, gardening, yardwork, raking, clipping, cutting, dressing, showering, sleeping. Relevant past medical history and comorbidities include diabetes (currently controlled), idiopathic subglottic tracheal stenosis (surgery coming up end of month, causes shortness of breath upon exertion), low back pain on left side, obesity, GERD, when she takes caffeine she gest  palpitations, high blood pressure.  Patient denies hx of cancer, stroke, seizures, lung problem (besides trachea problem), major cardiac events, unexplained weight loss, changes in bowel or bladder problems, new onset stumbling or dropping things apart from described below.    Limitations House hold activities;Other (comment);Lifting   usinghands and force, gardening, yardwork, raking, clipping, cutting, dressing, showering, sleeping.   Diagnostic tests C-spine MRI report 02/20/2020:  "IMPRESSION:1. Minimal disc osteophyte complex with left uncovertebral spurringat C5-C6 resulting in mild left foraminal stenosis.2. No canal stenosis at any level."    Patient Stated Goals "to not have arm pain"    Currently in Pain? Yes    Pain Score 2             TREATMENT: DOES have latex allergy  Therapeutic exercise:to centralize symptoms and improve ROM, strength, muscular endurance, and activity tolerance required for successful completion of functional activities. - seated thoracic extension over back of chair x 20 - hooklying angels while laying on half foam roll, x20  Circuit while laying on half foam roll:  - hooklying B horizontal shoulder abduction,3x10 with blue theraband.  - hooklying D2 shoulder flexion with blue theraband,3x10 each direction.  - standing L shoulder ER and lat stretch at doorway and then with foam roller on wall, and finally with hands behind neck facing wall with thoracic extension with elbows on wall x 10 (patient reports reproduces symptoms in a "good" way. Added to HEP.   Manual therapy: to reduce pain and tissue tension, improve range of motion, neuromodulation, in order to promote improved ability to complete functional activities. Prone position with left arm abducted:  - STM to left subscapularis, lat dorsi, and teres major region with sustained pressure. Patient reported reproduction of symptoms, pain and was noted for tightness.   HOME EXERCISE PROGRAM Access Code: [email or web address] URL: [email or web address] Date: 03/12/2020 Prepared by: Rosita Kea  Exercises Seated Cervical Flexion AROM - 6 x daily - 1 sets - 10 reps          PT Education - 04/29/20 1520    Education Details Exercise purpose/form. Self management techniques    Person(s) Educated Patient    Methods Explanation;Demonstration;Tactile cues;Verbal cues    Comprehension Verbalized understanding;Returned demonstration;Verbal cues  required;Tactile cues required;Need further instruction               PT Long Term Goals - 04/27/20 1712      PT LONG TERM GOAL #1   Title Be independent with a long-term home exercise program for self-management of symptoms.    Baseline initial HEP provided at IE (03/04/2020); currently participating in appropriate HEP (04/27/2020);    Time 12    Period Weeks    Status Partially Met   TARGET DATE FOR ALL LONG TERM GOALS: 05/27/2020     PT LONG TERM GOAL #2   Title Demonstrate improved FOTO score by 10 units to demonstrate improvement in overall condition and self-reported functional ability.    Baseline deferred to visit 2 (03/04/2020); 54 at visit 2 (03/10/2020); 69 at visit 10 (04/27/2020);    Time 12    Period Weeks    Status Achieved      PT LONG TERM GOAL #3   Title Improve left UE  strength to equal or greater than  4+/5 for improved ability to allow patient to complete valued functional tasks such as lifting, pressing, grasping, holding, etc,  with less difficulty.    Baseline wrist extension 3/5 and reports weakness with repetition (03/05/2020); nearly  met - lacks L wrist flexion (04/27/2020);    Time 12    Period Weeks    Status Partially Met      PT LONG TERM GOAL #4   Title Patient will report no longer having pain down the left arm to improve ability to use left UE for functional tasks such as grasping, pressing, stabilizing.    Baseline 8/10 (03/05/2020); 4/10 highest in the last two weeks (04/27/2020);    Time 12    Period Weeks    Status Partially Met      PT LONG TERM GOAL #5   Title Complete community, work and/or recreational activities without limitation due to current condition.    Baseline Functional Limitations: using left UE for manual tasks, using the phone, work tasks, holding pressure on wounds (unable), holding things without dropping them, opening packages, pulling back a stopper in a syringe, holding a cup, taking lids off, grooming, folding  laundry, making the bed, anything that requires two hands and force, gardening, yardwork, raking, clipping, cutting, dressing, showering, sleeping (03/04/2020); improved with all task but still gets tired early in the left UE and switches hands if able - also still cannot button bra and has difficulty putting on a jacket (04/27/2020);    Time 12    Period Weeks    Status Partially Met                 Plan - 04/29/20 1613    Clinical Impression Statement Patient tolerated treatment well overall and continues to benefit from exercises targeting improved posture. Also felt benefit from manual therapy to the L shoulder internal rotators and lat muscles. Patient would benefit from continued management of limiting condition by skilled physical therapist to address remaining impairments and functional limitations to work towards stated goals and return to PLOF or maximal functional independence.    Personal Factors and Comorbidities Age;Comorbidity 3+;Past/Current Experience;Fitness;Time since onset of injury/illness/exacerbation;Profession;Education    Comorbidities Relevant past medical history and comorbidities include diabetes (currently controlled), idiopathic subglottic tracheal stenosis (surgery coming up end of month, causes shortness of breath upon exertion), low back pain on left side, obesity, GERD, when she takes caffeine she gest palpitations, high blood pressure.    Examination-Activity Limitations Bathing;Hygiene/Grooming;Lift;Caring for Others;Reach Overhead;Carry;Dressing;Sleep    Examination-Participation Restrictions Community Activity;Occupation;Yard Work;Meal Prep;Laundry;Cleaning;Interpersonal Relationship;Other   using left UE for manual tasks, using the phone, holding pressure on wounds (unable), holding things without dropping them, opening packages, pulling back a stopper in a syringe, holding a cup, taking lids off, making the bed   Stability/Clinical Decision Making  Evolving/Moderate complexity    Rehab Potential Good    PT Frequency 2x / week    PT Duration 12 weeks    PT Treatment/Interventions Moist Heat;Therapeutic exercise;Manual techniques;Dry needling;Electrical Stimulation;ADLs/Self Care Home Management;Cryotherapy;Traction;Therapeutic activities;Neuromuscular re-education;Patient/family education;Passive range of motion;Taping;Joint Manipulations;Spinal Manipulations    PT Next Visit Plan manual therapy, exercise as tolerated    PT Home Exercise Plan thoracic extension, cervical spine retraction extension rotation, median nerve glide    Consulted and Agree with Plan of Care Patient           Patient will benefit from skilled therapeutic intervention in order to improve the following deficits and impairments:  Pain,Decreased activity tolerance,Impaired sensation,Improper body mechanics,Postural dysfunction,Increased muscle spasms,Decreased coordination,Impaired tone,Decreased endurance,Decreased range of motion,Decreased strength,Impaired perceived functional ability,Obesity,Impaired UE functional use  Visit Diagnosis: Radiculopathy, cervical region  Cervicalgia  Nonintractable headache, unspecified chronicity pattern, unspecified headache type  Pain in thoracic spine  Problem List Patient Active Problem List   Diagnosis Date Noted  . Cervical radiculitis 03/03/2020  . Weakness of left arm 03/03/2020  . Cervicalgia 02/28/2020  . Spondylosis, cervical, 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  . Acute URI 12/04/2019  . Acute maxillary sinusitis 12/04/2019  . Loud snoring 08/01/2019  . B12 deficiency 08/01/2019  . Eczema of left external ear 08/01/2019  . Hyperlipidemia associated with type 2 diabetes mellitus ([place]) 07/31/2019  . Nonallopathic lesion of cervical region 04/29/2019  . Nonallopathic lesion of thoracic region 04/29/2019  . Nonallopathic lesion of rib cage 04/29/2019  . Cervical radiculopathy at C7 03/26/2019  . Abnormal breast  exam 01/24/2019  . Elevated ALT measurement 01/24/2019  . Allergic rhinitis due to pollen 08/18/2018  . Abnormal mammogram of right breast 02/27/2018  . Dermatitis 05/02/2017  . Type 2 diabetes mellitus without complications ([place]) 27/78/2423  . Idiopathic subglottic tracheal stenosis 06/21/2016  . Encounter for general adult medical examination with abnormal findings 04/29/2016  . Low back pain without sciatica 04/28/2015  . Class 3 severe obesity without serious comorbidity with body mass index (BMI) of 40.0 to 44.9 in adult ([place]) 04/28/2015  . Pre-syncope 07/14/2014    Everlean Alstrom. Graylon Good, PT, DPT 04/29/20, 4:13 PM  [facility] [address] [place], [address] Phone: [phone]   Fax:  [phone]  Name: Lisa [name] MRN: [id number] Date of Birth: 1975-09-18

## 2020-05-04 ENCOUNTER — Encounter: Payer: Self-pay | Admitting: Physical Therapy

## 2020-05-04 ENCOUNTER — Ambulatory Visit: Payer: No Typology Code available for payment source | Admitting: Physical Therapy

## 2020-05-04 ENCOUNTER — Other Ambulatory Visit: Payer: Self-pay

## 2020-05-04 DIAGNOSIS — M5412 Radiculopathy, cervical region: Secondary | ICD-10-CM

## 2020-05-04 DIAGNOSIS — R519 Headache, unspecified: Secondary | ICD-10-CM

## 2020-05-04 DIAGNOSIS — M546 Pain in thoracic spine: Secondary | ICD-10-CM

## 2020-05-04 DIAGNOSIS — M542 Cervicalgia: Secondary | ICD-10-CM

## 2020-05-04 NOTE — Therapy (Signed)
[facility] [address], [place], [address] Phone: [phone]   Fax:  [phone]  Physical Therapy Treatment  Patient Details  Name: Lisa [name] MRN: [id number] Date of Birth: [birth year] Referring Provider (PT): Molli Barrows, MD (pain clinic)   Encounter Date: 05/04/2020   PT End of Session - 05/04/20 1656    Visit Number 12    Number of Visits 24    Date for PT Re-Evaluation 05/27/20    Authorization Type [place] [employer] reporting period from 04/29/2020    Progress Note Due on Visit 20    PT Start Time 1650    PT Stop Time 1730    PT Time Calculation (min) 40 min    Activity Tolerance Patient tolerated treatment well    Behavior During Therapy [facility] for tasks assessed/performed           Past Medical History:  Diagnosis Date  . Bronchitis    hx of  . Cervical radiculitis 03/03/2020  . Complication of anesthesia   . DDD (degenerative disc disease), cervical 02/28/2020  . Dysrhythmia    "does not see cardiologist"  . Family history of anesthesia complication    "morphine night terrors"  . GERD (gastroesophageal reflux disease)   . Headache(784.0)    "migraines"  . Hypertension   . PONV (postoperative nausea and vomiting)   . Shortness of breath    "wheezing"  . Weakness of left arm 03/03/2020    Past Surgical History:  Procedure Laterality Date  . CESAREAN SECTION     x 2  . MICROLARYNGOSCOPY WITH DILATION N/A 03/13/2020   Procedure: MICROLARYNGOSCOPY WITH DILATION w/JET VENT MITOMYCIN C;  Surgeon: Melida Quitter, MD;  Location: [facility];  Service: ENT;  Laterality: N/A;  . MICROLARYNGOSCOPY WITH LASER AND BALLOON DILATION N/A 07/20/2012   Procedure: MICROLARYNGOSCOPY WITH LASER AND BALLOON DILATION;  Surgeon: Melida Quitter, MD;  Location: [facility];  Service: ENT;  Laterality: N/A;  WITH JET VENTURI VENTILATION  . TONGUE FLAP RELEASE    . widom extractions      There were no vitals  filed for this visit.   Subjective Assessment - 05/04/20 1654    Subjective Patient reports she has 3/10 pain in the anterior and posterior L shoulder and forearm. States she did a lot of housework on Friday and had a bad headache on Saturday that improved by Sunday. Is currently on call at work.    Pertinent History Patient is a 44 y.o. female who presents to outpatient physical therapy with a referral for medical diagnosis cervicalgia, cervical degenerative disc disease. This patient's chief complaints consist of increased neck pain and new left UE pain/weakness/paresthesia and left sided rib pain on top of chronic left neck pain radiating to the left face and headache, leading to the following functional deficits: difficulty using left UE for manual tasks, using the phone, work tasks, holding pressure on wounds (unable), holding things without dropping them, opening packages, pulling back a stopper in a syringe, holding a cup, taking lids off, grooming, folding laundry, making the bed, anything that requires two hands and force, gardening, yardwork, raking, clipping, cutting, dressing, showering, sleeping. Relevant past medical history and comorbidities include diabetes (currently controlled), idiopathic subglottic tracheal stenosis (surgery coming up end of month, causes shortness of breath upon exertion), low back pain on left side, obesity, GERD, when she takes caffeine she gest palpitations, high blood pressure.  Patient denies hx of cancer,  stroke, seizures, lung problem (besides trachea problem), major cardiac events, unexplained weight loss, changes in bowel or bladder problems, new onset stumbling or dropping things apart from described below.    Limitations House hold activities;Other (comment);Lifting   usinghands and force, gardening, yardwork, raking, clipping, cutting, dressing, showering, sleeping.   Diagnostic tests C-spine MRI report 02/20/2020: "IMPRESSION:1. Minimal disc osteophyte complex  with left uncovertebral spurringat C5-C6 resulting in mild left foraminal stenosis.2. No canal stenosis at any level."    Patient Stated Goals "to not have arm pain"    Currently in Pain? Yes    Pain Score 3            TREATMENT: DOES have latex allergy  Therapeutic exercise:to centralize symptoms and improve ROM, strength, muscular endurance, and activity tolerance required for successful completion of functional activities. -seated thoracic extension over back of chair, x20 - hooklying angels while laying on half foam roll, x20  Circuit while laying on half foam roll:  - hooklying B horizontal shoulder abduction,3x10 with blue theraband.  - hooklying D2 shoulder flexion with blue theraband,2x10 each direction.  - seated sideflexion and contralateral rotation cervical spine stretch with fingers holding chair to deepen stretch, 3x15 seconds each side. No effect on symptoms.  - Standing cervical thoracic extension/BUE flexion and serratus anterior activation, lat stretch, with foam roller up wall, 5 -10 second holds, x10. - standing elbows/forearms on wall thoracic stretch, flexed at hips. X 5 with 5 second holds.   Manual therapy: to reduce pain and tissue tension, improve range of motion, neuromodulation, in order to promote improved ability to complete functional activities. Supine:  - STM to posterior cervical spine musculature focusing on L > R - gentle distraction to cervical spine, intermittant  HOME EXERCISE PROGRAM Access Code: [email or web address] URL: [email or web address] Date: 03/12/2020 Prepared by: Rosita Kea  Exercises Seated Cervical Flexion AROM - 6 x daily - 1 sets - 10 reps           PT Education - 05/04/20 1655    Education Details Exercise purpose/form. Self management techniques    Person(s) Educated Patient    Methods Explanation;Demonstration;Tactile cues;Verbal cues    Comprehension Verbalized understanding;Returned  demonstration;Verbal cues required;Tactile cues required;Need further instruction               PT Long Term Goals - 04/27/20 1712      PT LONG TERM GOAL #1   Title Be independent with a long-term home exercise program for self-management of symptoms.    Baseline initial HEP provided at IE (03/04/2020); currently participating in appropriate HEP (04/27/2020);    Time 12    Period Weeks    Status Partially Met   TARGET DATE FOR ALL LONG TERM GOALS: 05/27/2020     PT LONG TERM GOAL #2   Title Demonstrate improved FOTO score by 10 units to demonstrate improvement in overall condition and self-reported functional ability.    Baseline deferred to visit 2 (03/04/2020); 54 at visit 2 (03/10/2020); 69 at visit 10 (04/27/2020);    Time 12    Period Weeks    Status Achieved      PT LONG TERM GOAL #3   Title Improve left UE  strength to equal or greater than  4+/5 for improved ability to allow patient to complete valued functional tasks such as lifting, pressing, grasping, holding, etc,  with less difficulty.    Baseline wrist extension 3/5 and reports weakness with repetition (03/05/2020); nearly met - lacks L wrist flexion (  04/27/2020);    Time 12    Period Weeks    Status Partially Met      PT LONG TERM GOAL #4   Title Patient will report no longer having pain down the left arm to improve ability to use left UE for functional tasks such as grasping, pressing, stabilizing.    Baseline 8/10 (03/05/2020); 4/10 highest in the last two weeks (04/27/2020);    Time 12    Period Weeks    Status Partially Met      PT LONG TERM GOAL #5   Title Complete community, work and/or recreational activities without limitation due to current condition.    Baseline Functional Limitations: using left UE for manual tasks, using the phone, work tasks, holding pressure on wounds (unable), holding things without dropping them, opening packages, pulling back a stopper in a syringe, holding a cup, taking lids  off, grooming, folding laundry, making the bed, anything that requires two hands and force, gardening, yardwork, raking, clipping, cutting, dressing, showering, sleeping (03/04/2020); improved with all task but still gets tired early in the left UE and switches hands if able - also still cannot button bra and has difficulty putting on a jacket (04/27/2020);    Time 12    Period Weeks    Status Partially Met                 Plan - 05/04/20 1931    Clinical Impression Statement Patient tolerated treatment well overall with no significant improvement in symptoms and actually felt worse by end of session. Patient is having difficulty altering symptoms in a positive way at this point but continues to work on improving posture and stretching the shoulder girdle and neck region. Patient would benefit from continued management of limiting condition by skilled physical therapist to address remaining impairments and functional limitations to work towards stated goals and return to PLOF or maximal functional independence.    Personal Factors and Comorbidities Age;Comorbidity 3+;Past/Current Experience;Fitness;Time since onset of injury/illness/exacerbation;Profession;Education    Comorbidities Relevant past medical history and comorbidities include diabetes (currently controlled), idiopathic subglottic tracheal stenosis (surgery coming up end of month, causes shortness of breath upon exertion), low back pain on left side, obesity, GERD, when she takes caffeine she gest palpitations, high blood pressure.    Examination-Activity Limitations Bathing;Hygiene/Grooming;Lift;Caring for Others;Reach Overhead;Carry;Dressing;Sleep    Examination-Participation Restrictions Community Activity;Occupation;Yard Work;Meal Prep;Laundry;Cleaning;Interpersonal Relationship;Other   using left UE for manual tasks, using the phone, holding pressure on wounds (unable), holding things without dropping them, opening packages,  pulling back a stopper in a syringe, holding a cup, taking lids off, making the bed   Stability/Clinical Decision Making Evolving/Moderate complexity    Rehab Potential Good    PT Frequency 2x / week    PT Duration 12 weeks    PT Treatment/Interventions Moist Heat;Therapeutic exercise;Manual techniques;Dry needling;Electrical Stimulation;ADLs/Self Care Home Management;Cryotherapy;Traction;Therapeutic activities;Neuromuscular re-education;Patient/family education;Passive range of motion;Taping;Joint Manipulations;Spinal Manipulations    PT Next Visit Plan manual therapy, exercise as tolerated    PT Home Exercise Plan thoracic extension, cervical spine retraction extension rotation, median nerve glide    Consulted and Agree with Plan of Care Patient           Patient will benefit from skilled therapeutic intervention in order to improve the following deficits and impairments:  Pain,Decreased activity tolerance,Impaired sensation,Improper body mechanics,Postural dysfunction,Increased muscle spasms,Decreased coordination,Impaired tone,Decreased endurance,Decreased range of motion,Decreased strength,Impaired perceived functional ability,Obesity,Impaired UE functional use  Visit Diagnosis: Radiculopathy, cervical region  Cervicalgia  Nonintractable  headache, unspecified chronicity pattern, unspecified headache type  Pain in thoracic spine     Problem List Patient Active Problem List   Diagnosis Date Noted  . Cervical radiculitis 03/03/2020  . Weakness of left arm 03/03/2020  . Cervicalgia 02/28/2020  . Spondylosis, cervical, 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  . Acute URI 12/04/2019  . Acute maxillary sinusitis 12/04/2019  . Loud snoring 08/01/2019  . B12 deficiency 08/01/2019  . Eczema of left external ear 08/01/2019  . Hyperlipidemia associated with type 2 diabetes mellitus ([place]) 07/31/2019  . Nonallopathic lesion of cervical region 04/29/2019  . Nonallopathic lesion of thoracic region  04/29/2019  . Nonallopathic lesion of rib cage 04/29/2019  . Cervical radiculopathy at C7 03/26/2019  . Abnormal breast exam 01/24/2019  . Elevated ALT measurement 01/24/2019  . Allergic rhinitis due to pollen 08/18/2018  . Abnormal mammogram of right breast 02/27/2018  . Dermatitis 05/02/2017  . Type 2 diabetes mellitus without complications ([place]) 03/09/8526  . Idiopathic subglottic tracheal stenosis 06/21/2016  . Encounter for general adult medical examination with abnormal findings 04/29/2016  . Low back pain without sciatica 04/28/2015  . Class 3 severe obesity without serious comorbidity with body mass index (BMI) of 40.0 to 44.9 in adult ([place]) 04/28/2015  . Pre-syncope 07/14/2014   Everlean Alstrom. Graylon Good, PT, DPT 05/04/20, 7:33 PM  [facility] [address] [place], [address] Phone: [phone]   Fax:  [phone]  Name: Lisa [name] MRN: [id number] Date of Birth: 1975-05-28

## 2020-05-06 ENCOUNTER — Other Ambulatory Visit: Payer: Self-pay

## 2020-05-06 ENCOUNTER — Ambulatory Visit: Payer: No Typology Code available for payment source | Admitting: Physical Therapy

## 2020-05-06 ENCOUNTER — Encounter: Payer: Self-pay | Admitting: Physical Therapy

## 2020-05-06 DIAGNOSIS — M5412 Radiculopathy, cervical region: Secondary | ICD-10-CM | POA: Diagnosis not present

## 2020-05-06 DIAGNOSIS — R519 Headache, unspecified: Secondary | ICD-10-CM

## 2020-05-06 DIAGNOSIS — M542 Cervicalgia: Secondary | ICD-10-CM

## 2020-05-06 DIAGNOSIS — M546 Pain in thoracic spine: Secondary | ICD-10-CM

## 2020-05-06 NOTE — Therapy (Signed)
[facility] [address], [place], [address] Phone: [phone]   Fax:  [phone]  Physical Therapy Treatment  Patient Details  Name: Lisa [name] MRN: [id number] Date of Birth: 1975/07/06 Referring Provider (PT): Molli Barrows, MD (pain clinic)   Encounter Date: 05/06/2020   PT End of Session - 05/06/20 1655    Visit Number 13    Number of Visits 24    Date for PT Re-Evaluation 05/27/20    Authorization Type [employer] reporting period from 04/29/2020    Progress Note Due on Visit 20    PT Start Time 1650    PT Stop Time 1730    PT Time Calculation (min) 40 min    Activity Tolerance Patient tolerated treatment well    Behavior During Therapy [facility] for tasks assessed/performed           Past Medical History:  Diagnosis Date  . Bronchitis    hx of  . Cervical radiculitis 03/03/2020  . Complication of anesthesia   . DDD (degenerative disc disease), cervical 02/28/2020  . Dysrhythmia    "does not see cardiologist"  . Family history of anesthesia complication    "morphine night terrors"  . GERD (gastroesophageal reflux disease)   . Headache(784.0)    "migraines"  . Hypertension   . PONV (postoperative nausea and vomiting)   . Shortness of breath    "wheezing"  . Weakness of left arm 03/03/2020    Past Surgical History:  Procedure Laterality Date  . CESAREAN SECTION     x 2  . MICROLARYNGOSCOPY WITH DILATION N/A 03/13/2020   Procedure: MICROLARYNGOSCOPY WITH DILATION w/JET VENT MITOMYCIN C;  Surgeon: Melida Quitter, MD;  Location: [facility];  Service: ENT;  Laterality: N/A;  . MICROLARYNGOSCOPY WITH LASER AND BALLOON DILATION N/A 07/20/2012   Procedure: MICROLARYNGOSCOPY WITH LASER AND BALLOON DILATION;  Surgeon: Melida Quitter, MD;  Location: [place];  Service: ENT;  Laterality: N/A;  WITH JET VENTURI VENTILATION  . TONGUE FLAP RELEASE    . widom extractions      There were no vitals  filed for this visit.   Subjective Assessment - 05/06/20 1653    Subjective Patient reports she is feeling better today than Monday. She has not been using it as much as usual today because they were fully staffed at work. She was pretty sore folloiwng last treatment session. Reports pain at 1/10 in the left arm from  mid forearm to thumb on the left side. Ribs are also kind of sore.    Pertinent History Patient is a 44 y.o. female who presents to outpatient physical therapy with a referral for medical diagnosis cervicalgia, cervical degenerative disc disease. This patient's chief complaints consist of increased neck pain and new left UE pain/weakness/paresthesia and left sided rib pain on top of chronic left neck pain radiating to the left face and headache, leading to the following functional deficits: difficulty using left UE for manual tasks, using the phone, work tasks, holding pressure on wounds (unable), holding things without dropping them, opening packages, pulling back a stopper in a syringe, holding a cup, taking lids off, grooming, folding laundry, making the bed, anything that requires two hands and force, gardening, yardwork, raking, clipping, cutting, dressing, showering, sleeping. Relevant past medical history and comorbidities include diabetes (currently controlled), idiopathic subglottic tracheal stenosis (surgery coming up end of month, causes shortness of breath upon exertion), low back pain on left side,  obesity, GERD, when she takes caffeine she gest palpitations, high blood pressure.  Patient denies hx of cancer, stroke, seizures, lung problem (besides trachea problem), major cardiac events, unexplained weight loss, changes in bowel or bladder problems, new onset stumbling or dropping things apart from described below.    Limitations House hold activities;Other (comment);Lifting   usinghands and force, gardening, yardwork, raking, clipping, cutting, dressing, showering, sleeping.    Diagnostic tests C-spine MRI report 02/20/2020: "IMPRESSION:1. Minimal disc osteophyte complex with left uncovertebral spurringat C5-C6 resulting in mild left foraminal stenosis.2. No canal stenosis at any level."    Patient Stated Goals "to not have arm pain"    Currently in Pain? Yes    Pain Score 1            TREATMENT: DOES have latex allergy   Modality: (unbilled) Dry needling performed to posterior cervical spine region to decrease pain and spasms along patient's left neck and UE with patient in prone lying utilizing (2) dry needle(s) .38m x 624mwith (2) sticks to the R and L cervical spine multifidi at approximately C6 and (2) sticks along the left upper trap and levator scapula.  Patient educated about the risks and benefits from therapy and verbally consents to treatment. Dry needling performed by SaEverlean AlstromSnGraylon GoodPT, DPT who is certified in this technique.  Manual therapy:to reduce pain and tissue tension, improve range of motion, neuromodulation, in order to promote improved ability to complete functional activities. Prone:  - STM to posterior cervical spine musculature including paraspinal, UT, and levator scap, focusing on L > R - CPA to cervical spine and upper thoracic spine with and without KE wedge, grade II-IV, to improve extension mobility and decrease pain.  - STM to the left subscapularis and latissimus dorsi for symptom relief.   HOME EXERCISE PROGRAM Access Code: [email or web address]: [email or web address] Date: 03/12/2020 Prepared by: SaRosita KeaExercises Seated Cervical Flexion AROM - 6 x daily - 1 sets - 10 reps           PT Education - 05/06/20 1654    Education Details Exercise purpose/form. Self management techniques. Dry needling benefits and risks.    Person(s) Educated Patient    Methods Explanation;Demonstration;Tactile cues;Verbal cues    Comprehension Verbalized understanding;Returned demonstration;Verbal cues  required;Tactile cues required;Need further instruction               PT Long Term Goals - 04/27/20 1712      PT LONG TERM GOAL #1   Title Be independent with a long-term home exercise program for self-management of symptoms.    Baseline initial HEP provided at IE (03/04/2020); currently participating in appropriate HEP (04/27/2020);    Time 12    Period Weeks    Status Partially Met   TARGET DATE FOR ALL LONG TERM GOALS: 05/27/2020     PT LONG TERM GOAL #2   Title Demonstrate improved FOTO score by 10 units to demonstrate improvement in overall condition and self-reported functional ability.    Baseline deferred to visit 2 (03/04/2020); 54 at visit 2 (03/10/2020); 69 at visit 10 (04/27/2020);    Time 12    Period Weeks    Status Achieved      PT LONG TERM GOAL #3   Title Improve left UE  strength to equal or greater than  4+/5 for improved ability to allow patient to complete valued functional tasks such as lifting, pressing, grasping, holding, etc,  with less difficulty.    Baseline wrist  extension 3/5 and reports weakness with repetition (03/05/2020); nearly met - lacks L wrist flexion (04/27/2020);    Time 12    Period Weeks    Status Partially Met      PT LONG TERM GOAL #4   Title Patient will report no longer having pain down the left arm to improve ability to use left UE for functional tasks such as grasping, pressing, stabilizing.    Baseline 8/10 (03/05/2020); 4/10 highest in the last two weeks (04/27/2020);    Time 12    Period Weeks    Status Partially Met      PT LONG TERM GOAL #5   Title Complete community, work and/or recreational activities without limitation due to current condition.    Baseline Functional Limitations: using left UE for manual tasks, using the phone, work tasks, holding pressure on wounds (unable), holding things without dropping them, opening packages, pulling back a stopper in a syringe, holding a cup, taking lids off, grooming, folding  laundry, making the bed, anything that requires two hands and force, gardening, yardwork, raking, clipping, cutting, dressing, showering, sleeping (03/04/2020); improved with all task but still gets tired early in the left UE and switches hands if able - also still cannot button bra and has difficulty putting on a jacket (04/27/2020);    Time 12    Period Weeks    Status Partially Met                 Plan - 05/06/20 1933    Clinical Impression Statement Patient tolerated treatment well overall including no excessive pain or bruising with dry needling. Did not get a [name] twitch response at the left levator/UT location. Did have some vauge altered sensation at left thumb with pressure to the levator scap and stronger reproduction of symptoms and soreness at the subscapularis and lat region. Did have a sudden shooting pain down the left side of the torso when lying in prone while moving L shoulder overhead to reach the subscapularis and lat. Patient did report mild reduction in symptoms with only thumb pain instead of thumb and wrist pain by end of session. Will monitor for dry needling response further at next session. Patient would benefit from continued management of limiting condition by skilled physical therapist to address remaining impairments and functional limitations to work towards stated goals and return to PLOF or maximal functional independence.    Personal Factors and Comorbidities Age;Comorbidity 3+;Past/Current Experience;Fitness;Time since onset of injury/illness/exacerbation;Profession;Education    Comorbidities Relevant past medical history and comorbidities include diabetes (currently controlled), idiopathic subglottic tracheal stenosis (surgery coming up end of month, causes shortness of breath upon exertion), low back pain on left side, obesity, GERD, when she takes caffeine she gest palpitations, high blood pressure.    Examination-Activity Limitations  Bathing;Hygiene/Grooming;Lift;Caring for Others;Reach Overhead;Carry;Dressing;Sleep    Examination-Participation Restrictions Community Activity;Occupation;Yard Work;Meal Prep;Laundry;Cleaning;Interpersonal Relationship;Other   using left UE for manual tasks, using the phone, holding pressure on wounds (unable), holding things without dropping them, opening packages, pulling back a stopper in a syringe, holding a cup, taking lids off, making the bed   Stability/Clinical Decision Making Evolving/Moderate complexity    Rehab Potential [name]    PT Frequency 2x / week    PT Duration 12 weeks    PT Treatment/Interventions Moist Heat;Therapeutic exercise;Manual techniques;Dry needling;Electrical Stimulation;ADLs/Self Care Home Management;Cryotherapy;Traction;Therapeutic activities;Neuromuscular re-education;Patient/family education;Passive range of motion;Taping;Joint Manipulations;Spinal Manipulations    PT Next Visit Plan manual therapy, exercise as tolerated    PT Home Exercise  Plan thoracic extension, cervical spine retraction extension rotation, median nerve glide    Consulted and Agree with Plan of Care Patient           Patient will benefit from skilled therapeutic intervention in order to improve the following deficits and impairments:  Pain,Decreased activity tolerance,Impaired sensation,Improper body mechanics,Postural dysfunction,Increased muscle spasms,Decreased coordination,Impaired tone,Decreased endurance,Decreased range of motion,Decreased strength,Impaired perceived functional ability,Obesity,Impaired UE functional use  Visit Diagnosis: Radiculopathy, cervical region  Cervicalgia  Nonintractable headache, unspecified chronicity pattern, unspecified headache type  Pain in thoracic spine     Problem List Patient Active Problem List   Diagnosis Date Noted  . Cervical radiculitis 03/03/2020  . Weakness of left arm 03/03/2020  . Cervicalgia 02/28/2020  . Spondylosis, cervical,  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  . Acute URI 12/04/2019  . Acute maxillary sinusitis 12/04/2019  . Loud snoring 08/01/2019  . B12 deficiency 08/01/2019  . Eczema of left external ear 08/01/2019  . Hyperlipidemia associated with type 2 diabetes mellitus ([place]) 07/31/2019  . Nonallopathic lesion of cervical region 04/29/2019  . Nonallopathic lesion of thoracic region 04/29/2019  . Nonallopathic lesion of rib cage 04/29/2019  . Cervical radiculopathy at C7 03/26/2019  . Abnormal breast exam 01/24/2019  . Elevated ALT measurement 01/24/2019  . Allergic rhinitis due to pollen 08/18/2018  . Abnormal mammogram of right breast 02/27/2018  . Dermatitis 05/02/2017  . Type 2 diabetes mellitus without complications ([place]) 76/73/4193  . Idiopathic subglottic tracheal stenosis 06/21/2016  . Encounter for general adult medical examination with abnormal findings 04/29/2016  . Low back pain without sciatica 04/28/2015  . Class 3 severe obesity without serious comorbidity with body mass index (BMI) of 40.0 to 44.9 in adult ([place]) 04/28/2015  . Pre-syncope 07/14/2014    Lisa [name], PT, DPT 05/06/20, 7:34 PM  [facility] [address], [place], [address] Phone: [phone]   Fax:  [phone]  Name: Lisa [name] MRN: [id number] Date of Birth: 03/29/1976

## 2020-05-12 ENCOUNTER — Ambulatory Visit: Payer: No Typology Code available for payment source | Admitting: Physical Therapy

## 2020-05-14 ENCOUNTER — Ambulatory Visit: Payer: No Typology Code available for payment source | Admitting: Physical Therapy

## 2020-05-19 ENCOUNTER — Other Ambulatory Visit: Payer: Self-pay

## 2020-05-19 ENCOUNTER — Encounter: Payer: Self-pay | Admitting: Physical Therapy

## 2020-05-19 ENCOUNTER — Ambulatory Visit: Payer: No Typology Code available for payment source | Attending: Anesthesiology | Admitting: Physical Therapy

## 2020-05-19 DIAGNOSIS — M546 Pain in thoracic spine: Secondary | ICD-10-CM | POA: Diagnosis present

## 2020-05-19 DIAGNOSIS — M542 Cervicalgia: Secondary | ICD-10-CM | POA: Insufficient documentation

## 2020-05-19 DIAGNOSIS — R519 Headache, unspecified: Secondary | ICD-10-CM | POA: Diagnosis present

## 2020-05-19 DIAGNOSIS — M5412 Radiculopathy, cervical region: Secondary | ICD-10-CM | POA: Diagnosis present

## 2020-05-19 NOTE — Therapy (Signed)
[facility] [address], [place], [address] Phone: [phone]   Fax:  [phone]  Physical Therapy Treatment  Patient Details  Name: Lisa [name] MRN: [id number] Date of Birth: [birth year] Referring Provider (Lisa [name]): Molli Barrows, MD (pain clinic)   Encounter Date: 05/19/2020   Lisa [name] End of Session - 05/20/20 1416    Visit Number 14    Number of Visits 24    Date for Lisa [name] Re-Evaluation 05/27/20    Authorization Type [employer] reporting period from 04/29/2020    Progress Note Due on Visit 20    Lisa [name] Start Time 1324    Lisa [name] Stop Time 1755    Lisa [name] Time Calculation (min) 40 min    Activity Tolerance Patient tolerated treatment well    Behavior During Therapy Lisa [name] for tasks assessed/performed           Past Medical History:  Diagnosis Date  . Bronchitis    hx of  . Cervical radiculitis 03/03/2020  . Complication of anesthesia   . DDD (degenerative disc disease), cervical 02/28/2020  . Dysrhythmia    "does not see cardiologist"  . Family history of anesthesia complication    "morphine night terrors"  . GERD (gastroesophageal reflux disease)   . Headache(784.0)    "migraines"  . Hypertension   . PONV (postoperative nausea and vomiting)   . Shortness of breath    "wheezing"  . Weakness of left arm 03/03/2020    Past Surgical History:  Procedure Laterality Date  . CESAREAN SECTION     x 2  . MICROLARYNGOSCOPY WITH DILATION N/A 03/13/2020   Procedure: MICROLARYNGOSCOPY WITH DILATION w/JET VENT MITOMYCIN C;  Surgeon: Melida Quitter, MD;  Location: [facility];  Service: ENT;  Laterality: N/A;  . MICROLARYNGOSCOPY WITH LASER AND BALLOON DILATION N/A 07/20/2012   Procedure: MICROLARYNGOSCOPY WITH LASER AND BALLOON DILATION;  Surgeon: Melida Quitter, MD;  Location: [facility];  Service: ENT;  Laterality: N/A;  WITH JET VENTURI VENTILATION  . TONGUE FLAP RELEASE    . widom extractions      There were no vitals  filed for this visit.   Subjective Assessment - 05/19/20 1717    Subjective Patient reports she felt sore the next day after dry needling. She states she is feeling sore today and tired. Last week she had more of her same pain and headaches and she also got her COVID19 booster on Wednesday and she feels like she has been tired since then. Reports pain is 2/10 today and feels her symptoms over her left upper trep and and left elbow and hand. She is feeling more tiredness in the left arm. She has not been doing any exercises over the last week. She saw the massage therapist Friday who worked a lot aorund both shoulder musculature and it was really uncomfortable on the left.    Pertinent History Patient is a 45 y.o. female who presents to outpatient physical therapy with a referral for medical diagnosis cervicalgia, cervical degenerative disc disease. This patient's chief complaints consist of increased neck pain and new left UE pain/weakness/paresthesia and left sided rib pain on top of chronic left neck pain radiating to the left face and headache, leading to the following functional deficits: difficulty using left UE for manual tasks, using the phone, work tasks, holding pressure on wounds (unable), holding things without dropping them, opening packages, pulling back a stopper in a syringe, holding a cup, taking lids off,  grooming, folding laundry, making the bed, anything that requires two hands and force, gardening, yardwork, raking, clipping, cutting, dressing, showering, sleeping. Relevant past medical history and comorbidities include diabetes (currently controlled), idiopathic subglottic tracheal stenosis (surgery coming up end of month, causes shortness of breath upon exertion), low back pain on left side, obesity, GERD, when she takes caffeine she gest palpitations, high blood pressure.  Patient denies hx of cancer, stroke, seizures, lung problem (besides trachea problem), major cardiac events,  unexplained weight loss, changes in bowel or bladder problems, new onset stumbling or dropping things apart from described below.    Limitations House hold activities;Other (comment);Lifting   usinghands and force, gardening, yardwork, raking, clipping, cutting, dressing, showering, sleeping.   Diagnostic tests C-spine MRI report 02/20/2020: "IMPRESSION:1. Minimal disc osteophyte complex with left uncovertebral spurringat C5-C6 resulting in mild left foraminal stenosis.2. No canal stenosis at any level."    Patient Stated Goals "to not have arm pain"    Currently in Pain? Yes    Pain Score 2           TREATMENT: DOES have latex allergy  Therapeutic exercise:to centralize symptoms and improve ROM, strength, muscular endurance, and activity tolerance required for successful completion of functional activities. -seated thoracic extension over back of chair, x20 - seated cervical retraction AROM - seated cervical retraction with self overpressure, 2x10 - seated cervical retraction with self overpressure to extension x 10 - standing left radial nerve floss x 15 - standing left radial nerve glide (tensioner, movement at neck) - seated repeated left cervical side bending 1x10 (got easier at neck, but bothered L eye last few reps) - seated cervical retraction with self overpressure x 10 - Education on HEP including handout   Manual therapy: to reduce pain and tissue tension, improve range of motion, neuromodulation, in order to promote improved ability to complete functional activities. -seated MDT left cervical sidebending with clinician overpressure x 6 (reports last couple started to hurt near eye - discontinued - no change in upper trap symptoms) - seated STM to left upper trap   HOME EXERCISE PROGRAM Access Code: [email or web address] URL: [email or web address] Date: 05/20/2020 Prepared by: Lisa [name]  Exercises Cervical Retraction with Overpressure - 3 x daily - 2 sets - 10  reps Radial Nerve Flossing - 3 x daily - 15 reps Seated Cervical Flexion AROM - 6 x daily - 1 sets - 10 reps Standing Radial Nerve Glide - 2 x daily - 15 reps    Lisa [name] Education - 05/19/20 1722    Education Details Exercise purpose/form. Self management techniques    Person(s) Educated Patient    Methods Explanation;Demonstration;Tactile cues    Comprehension Verbalized understanding;Returned demonstration;Verbal cues required;Tactile cues required;Need further instruction               Lisa [name] Long Term Goals - 04/27/20 1712      Lisa [name] LONG TERM GOAL #1   Title Be independent with a long-term home exercise program for self-management of symptoms.    Baseline initial HEP provided at IE (03/04/2020); currently participating in appropriate HEP (04/27/2020);    Time 12    Period Weeks    Status Partially Met   TARGET DATE FOR ALL LONG TERM GOALS: 05/27/2020     Lisa [name] LONG TERM GOAL #2   Title Demonstrate improved FOTO score by 10 units to demonstrate improvement in overall condition and self-reported functional ability.    Baseline deferred to visit 2 (03/04/2020); 54 at visit 2 (03/10/2020); 69 at visit  10 (04/27/2020);    Time 12    Period Weeks    Status Achieved      Lisa [name] LONG TERM GOAL #3   Title Improve left UE  strength to equal or greater than  4+/5 for improved ability to allow patient to complete valued functional tasks such as lifting, pressing, grasping, holding, etc,  with less difficulty.    Baseline wrist extension 3/5 and reports weakness with repetition (03/05/2020); nearly met - lacks L wrist flexion (04/27/2020);    Time 12    Period Weeks    Status Partially Met      Lisa [name] LONG TERM GOAL #4   Title Patient will report no longer having pain down the left arm to improve ability to use left UE for functional tasks such as grasping, pressing, stabilizing.    Baseline 8/10 (03/05/2020); 4/10 highest in the last two weeks (04/27/2020);    Time 12    Period Weeks    Status  Partially Met      Lisa [name] LONG TERM GOAL #5   Title Complete community, work and/or recreational activities without limitation due to current condition.    Baseline Functional Limitations: using left UE for manual tasks, using the phone, work tasks, holding pressure on wounds (unable), holding things without dropping them, opening packages, pulling back a stopper in a syringe, holding a cup, taking lids off, grooming, folding laundry, making the bed, anything that requires two hands and force, gardening, yardwork, raking, clipping, cutting, dressing, showering, sleeping (03/04/2020); improved with all task but still gets tired early in the left UE and switches hands if able - also still cannot button bra and has difficulty putting on a jacket (04/27/2020);    Time 12    Period Weeks    Status Partially Met                 Plan - 05/20/20 1420    Clinical Impression Statement Patient tolerated treatment well overall with centralization of symptoms to left upper trap. Updated HEP to only two exercises that appeared to be beneficial and tolerable in the clinic. Patient would benefit from continued management of limiting condition by skilled physical therapist to address remaining impairments and functional limitations to work towards stated goals and return to PLOF or maximal functional independence.    Personal Factors and Comorbidities Age;Comorbidity 3+;Past/Current Experience;Fitness;Time since onset of injury/illness/exacerbation;Profession;Education    Comorbidities Relevant past medical history and comorbidities include diabetes (currently controlled), idiopathic subglottic tracheal stenosis (surgery coming up end of month, causes shortness of breath upon exertion), low back pain on left side, obesity, GERD, when she takes caffeine she gest palpitations, high blood pressure.    Examination-Activity Limitations Bathing;Hygiene/Grooming;Lift;Caring for Others;Reach Overhead;Carry;Dressing;Sleep     Examination-Participation Restrictions Community Activity;Occupation;Yard Work;Meal Prep;Laundry;Cleaning;Interpersonal Relationship;Other   using left UE for manual tasks, using the phone, holding pressure on wounds (unable), holding things without dropping them, opening packages, pulling back a stopper in a syringe, holding a cup, taking lids off, making the bed   Stability/Clinical Decision Making Evolving/Moderate complexity    Rehab Potential [name]    Lisa [name] Frequency 2x / week    Lisa [name] Duration 12 weeks    Lisa [name] Treatment/Interventions Moist Heat;Therapeutic exercise;Manual techniques;Dry needling;Electrical Stimulation;ADLs/Self Care Home Management;Cryotherapy;Traction;Therapeutic activities;Neuromuscular re-education;Patient/family education;Passive range of motion;Taping;Joint Manipulations;Spinal Manipulations    Lisa [name] Next Visit Plan manual therapy, exercise as tolerated    Lisa [name] Home Exercise Plan thoracic extension, cervical spine retraction extension rotation, median nerve glide    Consulted  and Agree with Plan of Care Patient           Patient will benefit from skilled therapeutic intervention in order to improve the following deficits and impairments:  Pain,Decreased activity tolerance,Impaired sensation,Improper body mechanics,Postural dysfunction,Increased muscle spasms,Decreased coordination,Impaired tone,Decreased endurance,Decreased range of motion,Decreased strength,Impaired perceived functional ability,Obesity,Impaired UE functional use  Visit Diagnosis: Radiculopathy, cervical region  Cervicalgia  Nonintractable headache, unspecified chronicity pattern, unspecified headache type  Pain in thoracic spine     Problem List Patient Active Problem List   Diagnosis Date Noted  . Cervical radiculitis 03/03/2020  . Weakness of left arm 03/03/2020  . Cervicalgia 02/28/2020  . Spondylosis, cervical, 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  . Acute URI 12/04/2019  . Acute maxillary sinusitis  12/04/2019  . Loud snoring 08/01/2019  . B12 deficiency 08/01/2019  . Eczema of left external ear 08/01/2019  . Hyperlipidemia associated with type 2 diabetes mellitus ([place]) 07/31/2019  . Nonallopathic lesion of cervical region 04/29/2019  . Nonallopathic lesion of thoracic region 04/29/2019  . Nonallopathic lesion of rib cage 04/29/2019  . Cervical radiculopathy at C7 03/26/2019  . Abnormal breast exam 01/24/2019  . Elevated ALT measurement 01/24/2019  . Allergic rhinitis due to pollen 08/18/2018  . Abnormal mammogram of right breast 02/27/2018  . Dermatitis 05/02/2017  . Type 2 diabetes mellitus without complications ([facility]) 51/88/4166  . Idiopathic subglottic tracheal stenosis 06/21/2016  . Encounter for general adult medical examination with abnormal findings 04/29/2016  . Low back pain without sciatica 04/28/2015  . Class 3 severe obesity without serious comorbidity with body mass index (BMI) of 40.0 to 44.9 in adult ([place]) 04/28/2015  . Pre-syncope 07/14/2014    Lisa [name], Lisa [name], Lisa [name] 05/20/20, 2:21 PM  [facility] [address] [place], [address] Phone: [phone]   Fax:  [phone]  Name: Lisa [name] MRN: [id number] Date of Birth: 30-Mar-1976

## 2020-05-21 ENCOUNTER — Encounter: Payer: Self-pay | Admitting: Physical Therapy

## 2020-05-21 ENCOUNTER — Other Ambulatory Visit: Payer: Self-pay

## 2020-05-21 ENCOUNTER — Ambulatory Visit: Payer: No Typology Code available for payment source | Admitting: Physical Therapy

## 2020-05-21 DIAGNOSIS — M5412 Radiculopathy, cervical region: Secondary | ICD-10-CM | POA: Diagnosis not present

## 2020-05-21 DIAGNOSIS — R519 Headache, unspecified: Secondary | ICD-10-CM

## 2020-05-21 DIAGNOSIS — M546 Pain in thoracic spine: Secondary | ICD-10-CM

## 2020-05-21 DIAGNOSIS — M542 Cervicalgia: Secondary | ICD-10-CM

## 2020-05-21 NOTE — Therapy (Signed)
[facility] [address], [place], [address] Phone: [phone]   Fax:  [phone]  Physical Therapy Treatment  Patient Details  Name: NANAKO STOPHER MRN: [id number] Date of Birth: Sep 21, 1975 Referring Provider (PT): Molli Barrows, MD (pain clinic)   Encounter Date: 05/21/2020   PT End of Session - 05/21/20 1752    Visit Number 15    Number of Visits 24    Date for PT Re-Evaluation 05/27/20    Authorization Type [place] [employer] reporting period from 04/29/2020    Progress Note Due on Visit 20    PT Start Time 1654    PT Stop Time 1735    PT Time Calculation (min) 41 min    Activity Tolerance Patient tolerated treatment well    Behavior During Therapy [facility] for tasks assessed/performed           Past Medical History:  Diagnosis Date  . Bronchitis    hx of  . Cervical radiculitis 03/03/2020  . Complication of anesthesia   . DDD (degenerative disc disease), cervical 02/28/2020  . Dysrhythmia    "does not see cardiologist"  . Family history of anesthesia complication    "morphine night terrors"  . GERD (gastroesophageal reflux disease)   . Headache(784.0)    "migraines"  . Hypertension   . PONV (postoperative nausea and vomiting)   . Shortness of breath    "wheezing"  . Weakness of left arm 03/03/2020    Past Surgical History:  Procedure Laterality Date  . CESAREAN SECTION     x 2  . MICROLARYNGOSCOPY WITH DILATION N/A 03/13/2020   Procedure: MICROLARYNGOSCOPY WITH DILATION w/JET VENT MITOMYCIN C;  Surgeon: Melida Quitter, MD;  Location: [facility];  Service: ENT;  Laterality: N/A;  . MICROLARYNGOSCOPY WITH LASER AND BALLOON DILATION N/A 07/20/2012   Procedure: MICROLARYNGOSCOPY WITH LASER AND BALLOON DILATION;  Surgeon: Melida Quitter, MD;  Location: [place];  Service: ENT;  Laterality: N/A;  WITH JET VENTURI VENTILATION  . TONGUE FLAP RELEASE    . widom extractions      There were no vitals  filed for this visit.   Subjective Assessment - 05/21/20 1655    Subjective Patient reports she was pretty sore by her neck after last treatment session. Doesn't remember feeling pain in her hand after she left yesterday but was not paying a lot of attention. Today has some pain in her left dorsal hand near thumb and over her left upper trap region. She has been doing her HEP and her neck ROM seems easier into retraction and she still feels a really strong stretch to the left arm.    Pertinent History Patient is a 45 y.o. female who presents to outpatient physical therapy with a referral for medical diagnosis cervicalgia, cervical degenerative disc disease. This patient's chief complaints consist of increased neck pain and new left UE pain/weakness/paresthesia and left sided rib pain on top of chronic left neck pain radiating to the left face and headache, leading to the following functional deficits: difficulty using left UE for manual tasks, using the phone, work tasks, holding pressure on wounds (unable), holding things without dropping them, opening packages, pulling back a stopper in a syringe, holding a cup, taking lids off, grooming, folding laundry, making the bed, anything that requires two hands and force, gardening, yardwork, raking, clipping, cutting, dressing, showering, sleeping. Relevant past medical history and comorbidities include diabetes (currently controlled), idiopathic subglottic tracheal stenosis (surgery  coming up end of month, causes shortness of breath upon exertion), low back pain on left side, obesity, GERD, when she takes caffeine she gest palpitations, high blood pressure.  Patient denies hx of cancer, stroke, seizures, lung problem (besides trachea problem), major cardiac events, unexplained weight loss, changes in bowel or bladder problems, new onset stumbling or dropping things apart from described below.    Limitations House hold activities;Other (comment);Lifting   usinghands  and force, gardening, yardwork, raking, clipping, cutting, dressing, showering, sleeping.   Diagnostic tests C-spine MRI report 02/20/2020: "IMPRESSION:1. Minimal disc osteophyte complex with left uncovertebral spurringat C5-C6 resulting in mild left foraminal stenosis.2. No canal stenosis at any level."    Patient Stated Goals "to not have arm pain"    Currently in Pain? Yes    Pain Score 1            TREATMENT: DOES have latex allergy  Therapeutic exercise:to centralize symptoms and improve ROM, strength, muscular endurance, and activity tolerance required for successful completion of functional activities. -seated thoracic extension over back of chair,x20 - seated cervical retraction with self overpressure x 10  Manual therapy: to reduce pain and tissue tension, improve range of motion, neuromodulation, in order to promote improved ability to complete functional activities.  Prone position:  - CPA grade III-VI along thoracic spine and to mid cervical spine. Cavitations that felt good in mid to lower thoracic spine.   Supine position:  - left UE nerve glides moving in and out of tension isolating movement at wrist, elbow, shoulder and neck x15 reps each except neck which was 10 reps. For radial and median nerve. Median nerve increased pain with neck flexion towards more than away with least tension in neutral.  - Left first rib caudal mobilization with breath x 7 breaths and without breath grade III-IV - Left joint mobilization AP glide at [facility] joint and [place] joint as well as caudal glides at [facility] joint. Grades III-IV as tolerated.  - STM to Left upper trap and cervical paraspinals to decrease tension and pain.    HOME EXERCISE PROGRAM Access Code: [email or web address] URL: [email or web address] Date: 05/20/2020 Prepared by: Rosita Kea  Exercises Cervical Retraction with Overpressure - 3 x daily - 2 sets - 10 reps Radial Nerve Flossing - 3 x daily - 15 reps Seated Cervical  Flexion AROM - 6 x daily - 1 sets - 10 reps Standing Radial Nerve Glide - 2 x daily - 15 reps    PT Education - 05/21/20 1752    Education Details Exercise purpose/form. Self management techniques    Person(s) Educated Patient    Methods Explanation;Demonstration;Tactile cues;Verbal cues    Comprehension Verbalized understanding;Returned demonstration;Verbal cues required;Tactile cues required;Need further instruction               PT Long Term Goals - 04/27/20 1712      PT LONG TERM GOAL #1   Title Be independent with a long-term home exercise program for self-management of symptoms.    Baseline initial HEP provided at IE (03/04/2020); currently participating in appropriate HEP (04/27/2020);    Time 12    Period Weeks    Status Partially Met   TARGET DATE FOR ALL LONG TERM GOALS: 05/27/2020     PT LONG TERM GOAL #2   Title Demonstrate improved FOTO score by 10 units to demonstrate improvement in overall condition and self-reported functional ability.    Baseline deferred to visit 2 (03/04/2020); 54 at visit 2 (03/10/2020); 69 at visit  10 (04/27/2020);    Time 12    Period Weeks    Status Achieved      PT LONG TERM GOAL #3   Title Improve left UE  strength to equal or greater than  4+/5 for improved ability to allow patient to complete valued functional tasks such as lifting, pressing, grasping, holding, etc,  with less difficulty.    Baseline wrist extension 3/5 and reports weakness with repetition (03/05/2020); nearly met - lacks L wrist flexion (04/27/2020);    Time 12    Period Weeks    Status Partially Met      PT LONG TERM GOAL #4   Title Patient will report no longer having pain down the left arm to improve ability to use left UE for functional tasks such as grasping, pressing, stabilizing.    Baseline 8/10 (03/05/2020); 4/10 highest in the last two weeks (04/27/2020);    Time 12    Period Weeks    Status Partially Met      PT LONG TERM GOAL #5   Title Complete  community, work and/or recreational activities without limitation due to current condition.    Baseline Functional Limitations: using left UE for manual tasks, using the phone, work tasks, holding pressure on wounds (unable), holding things without dropping them, opening packages, pulling back a stopper in a syringe, holding a cup, taking lids off, grooming, folding laundry, making the bed, anything that requires two hands and force, gardening, yardwork, raking, clipping, cutting, dressing, showering, sleeping (03/04/2020); improved with all task but still gets tired early in the left UE and switches hands if able - also still cannot button bra and has difficulty putting on a jacket (04/27/2020);    Time 12    Period Weeks    Status Partially Met                 Plan - 05/21/20 1800    Clinical Impression Statement Patient tolerated treatment well overall and reported reduction in pain and less feeling of tension throughout her left UE. Stated she had a small "funny feeling" at her left thumb. Appears to respond well today's treatment. Patient would benefit from continued management of limiting condition by skilled physical therapist to address remaining impairments and functional limitations to work towards stated goals and return to PLOF or maximal functional independence.    Personal Factors and Comorbidities Age;Comorbidity 3+;Past/Current Experience;Fitness;Time since onset of injury/illness/exacerbation;Profession;Education    Comorbidities Relevant past medical history and comorbidities include diabetes (currently controlled), idiopathic subglottic tracheal stenosis (surgery coming up end of month, causes shortness of breath upon exertion), low back pain on left side, obesity, GERD, when she takes caffeine she gest palpitations, high blood pressure.    Examination-Activity Limitations Bathing;Hygiene/Grooming;Lift;Caring for Others;Reach Overhead;Carry;Dressing;Sleep     Examination-Participation Restrictions Community Activity;Occupation;Yard Work;Meal Prep;Laundry;Cleaning;Interpersonal Relationship;Other   using left UE for manual tasks, using the phone, holding pressure on wounds (unable), holding things without dropping them, opening packages, pulling back a stopper in a syringe, holding a cup, taking lids off, making the bed   Stability/Clinical Decision Making Evolving/Moderate complexity    Rehab Potential Good    PT Frequency 2x / week    PT Duration 12 weeks    PT Treatment/Interventions Moist Heat;Therapeutic exercise;Manual techniques;Dry needling;Electrical Stimulation;ADLs/Self Care Home Management;Cryotherapy;Traction;Therapeutic activities;Neuromuscular re-education;Patient/family education;Passive range of motion;Taping;Joint Manipulations;Spinal Manipulations    PT Next Visit Plan manual therapy, exercise as tolerated    PT Home Exercise Plan thoracic extension, cervical spine retraction extension rotation,  median nerve glide    Consulted and Agree with Plan of Care Patient           Patient will benefit from skilled therapeutic intervention in order to improve the following deficits and impairments:  Pain,Decreased activity tolerance,Impaired sensation,Improper body mechanics,Postural dysfunction,Increased muscle spasms,Decreased coordination,Impaired tone,Decreased endurance,Decreased range of motion,Decreased strength,Impaired perceived functional ability,Obesity,Impaired UE functional use  Visit Diagnosis: Radiculopathy, cervical region  Cervicalgia  Nonintractable headache, unspecified chronicity pattern, unspecified headache type  Pain in thoracic spine     Problem List Patient Active Problem List   Diagnosis Date Noted  . Cervical radiculitis 03/03/2020  . Weakness of left arm 03/03/2020  . Cervicalgia 02/28/2020  . Spondylosis, cervical, 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  . Acute URI 12/04/2019  . Acute maxillary sinusitis  12/04/2019  . Loud snoring 08/01/2019  . B12 deficiency 08/01/2019  . Eczema of left external ear 08/01/2019  . Hyperlipidemia associated with type 2 diabetes mellitus ([place]) 07/31/2019  . Nonallopathic lesion of cervical region 04/29/2019  . Nonallopathic lesion of thoracic region 04/29/2019  . Nonallopathic lesion of rib cage 04/29/2019  . Cervical radiculopathy at C7 03/26/2019  . Abnormal breast exam 01/24/2019  . Elevated ALT measurement 01/24/2019  . Allergic rhinitis due to pollen 08/18/2018  . Abnormal mammogram of right breast 02/27/2018  . Dermatitis 05/02/2017  . Type 2 diabetes mellitus without complications ([place]) 00/76/2263  . Idiopathic subglottic tracheal stenosis 06/21/2016  . Encounter for general adult medical examination with abnormal findings 04/29/2016  . Low back pain without sciatica 04/28/2015  . Class 3 severe obesity without serious comorbidity with body mass index (BMI) of 40.0 to 44.9 in adult ([place]) 04/28/2015  . Pre-syncope 07/14/2014    Everlean Alstrom. Graylon Good, PT, DPT 05/21/20, 6:01 PM  [facility] [address], [place], [address] Phone: [phone]   Fax:  [phone]  Name: ANNDREA MIHELICH MRN: [id number] Date of Birth: 11/25/1975

## 2020-05-26 ENCOUNTER — Encounter: Payer: Self-pay | Admitting: Physical Therapy

## 2020-05-26 ENCOUNTER — Ambulatory Visit: Payer: No Typology Code available for payment source | Admitting: Physical Therapy

## 2020-05-26 ENCOUNTER — Other Ambulatory Visit: Payer: Self-pay

## 2020-05-26 DIAGNOSIS — M546 Pain in thoracic spine: Secondary | ICD-10-CM

## 2020-05-26 DIAGNOSIS — M542 Cervicalgia: Secondary | ICD-10-CM

## 2020-05-26 DIAGNOSIS — M5412 Radiculopathy, cervical region: Secondary | ICD-10-CM | POA: Diagnosis not present

## 2020-05-26 DIAGNOSIS — R519 Headache, unspecified: Secondary | ICD-10-CM

## 2020-05-26 NOTE — Therapy (Signed)
[facility] [address] [place], [address] Phone: [phone]   Fax:  [phone]  Physical Therapy Treatment  Patient Details  Name: Lisa [name] MRN: [id number] Date of Birth: Mar 11, 1976 Referring Provider (PT): Molli Barrows, MD (pain clinic)   Encounter Date: 05/26/2020   PT End of Session - 05/26/20 1711    Visit Number 16    Number of Visits 24    Date for PT Re-Evaluation 05/27/20    Authorization Type [facility] [employer] reporting period from 04/29/2020    Progress Note Due on Visit 20    PT Start Time 1710    PT Stop Time 1750    PT Time Calculation (min) 40 min    Activity Tolerance Patient tolerated treatment well    Behavior During Therapy [facility] for tasks assessed/performed           Past Medical History:  Diagnosis Date  . Bronchitis    hx of  . Cervical radiculitis 03/03/2020  . Complication of anesthesia   . DDD (degenerative disc disease), cervical 02/28/2020  . Dysrhythmia    "does not see cardiologist"  . Family history of anesthesia complication    "morphine night terrors"  . GERD (gastroesophageal reflux disease)   . Headache(784.0)    "migraines"  . Hypertension   . PONV (postoperative nausea and vomiting)   . Shortness of breath    "wheezing"  . Weakness of left arm 03/03/2020    Past Surgical History:  Procedure Laterality Date  . CESAREAN SECTION     x 2  . MICROLARYNGOSCOPY WITH DILATION N/A 03/13/2020   Procedure: MICROLARYNGOSCOPY WITH DILATION w/JET VENT MITOMYCIN C;  Surgeon: Melida Quitter, MD;  Location: [facility];  Service: ENT;  Laterality: N/A;  . MICROLARYNGOSCOPY WITH LASER AND BALLOON DILATION N/A 07/20/2012   Procedure: MICROLARYNGOSCOPY WITH LASER AND BALLOON DILATION;  Surgeon: Melida Quitter, MD;  Location: [facility];  Service: ENT;  Laterality: N/A;  WITH JET VENTURI VENTILATION  . TONGUE FLAP RELEASE    . widom extractions      There were no vitals  filed for this visit.   Subjective Assessment - 05/26/20 1709    Subjective Patient reports she has been using her left arm a lot and it gets tired easily and is sore 2-3/10 especially over the dorsal surface of left thumb and hand bothering her driving. She did errands all day and did not work today. Felt okay following last treatment session. Not on call today. Patient has been doing her stretches and they are getting easier. Ribs are feeling fine today. Her hip is bothering her.    Pertinent History Patient is a 45 y.o. female who presents to outpatient physical therapy with a referral for medical diagnosis cervicalgia, cervical degenerative disc disease. This patient's chief complaints consist of increased neck pain and new left UE pain/weakness/paresthesia and left sided rib pain on top of chronic left neck pain radiating to the left face and headache, leading to the following functional deficits: difficulty using left UE for manual tasks, using the phone, work tasks, holding pressure on wounds (unable), holding things without dropping them, opening packages, pulling back a stopper in a syringe, holding a cup, taking lids off, grooming, folding laundry, making the bed, anything that requires two hands and force, gardening, yardwork, raking, clipping, cutting, dressing, showering, sleeping. Relevant past medical history and comorbidities include diabetes (currently controlled), idiopathic subglottic tracheal stenosis (surgery coming up  end of month, causes shortness of breath upon exertion), low back pain on left side, obesity, GERD, when she takes caffeine she gest palpitations, high blood pressure.  Patient denies hx of cancer, stroke, seizures, lung problem (besides trachea problem), major cardiac events, unexplained weight loss, changes in bowel or bladder problems, new onset stumbling or dropping things apart from described below.    Limitations House hold activities;Other (comment);Lifting    usinghands and force, gardening, yardwork, raking, clipping, cutting, dressing, showering, sleeping.   Diagnostic tests C-spine MRI report 02/20/2020: "IMPRESSION:1. Minimal disc osteophyte complex with left uncovertebral spurringat C5-C6 resulting in mild left foraminal stenosis.2. No canal stenosis at any level."    Patient Stated Goals "to not have arm pain"    Currently in Pain? Yes    Pain Score 3            TREATMENT: DOES have latex allergy  Therapeutic exercise:to centralize symptoms and improve ROM, strength, muscular endurance, and activity tolerance required for successful completion of functional activities. -seated thoracic extension over back of chair,x20 - seated cervical retraction with self overpressure x 20 - seated cervical retraction with self overpressure to cervicothoracic extension x10 (ncreased neck pain)  Manual therapy: to reduce pain and tissue tension, improve range of motion, neuromodulation, in order to promote improved ability to complete functional activities.  Prone position:  - CPA grade III-VI with KE wedge along thoracic spine and to mid cervical spine. Cavitations that felt good in mid to lower thoracic spine.   Supine position:  - left UE nerve glides moving in and out of tension isolating movement at wrist, elbow, shoulder and neck x15 reps each except neck which was 10 reps. For radial and median nerve. Median nerve increased pain with neck flexion towards more than away with least tension in neutral.  - Left first rib caudal mobilization with breath x 7 breaths and without breath grade III-IV - Left joint mobilization AP glide [place] joint as well as caudal glides at [facility] joint. Grades III-IV as tolerated.  - STM to Left upper trap and cervical paraspinals to decrease tension and pain.   HOME EXERCISE PROGRAM Access Code: [email or web address] URL: [email or web address] Date: 05/20/2020 Prepared by: Rosita Kea  Exercises Cervical  Retraction with Overpressure - 3 x daily - 2 sets - 10 reps Radial Nerve Flossing - 3 x daily - 15 reps Seated Cervical Flexion AROM - 6 x daily - 1 sets - 10 reps Standing Radial Nerve Glide - 2 x daily - 15 reps    PT Education - 05/26/20 1711    Education Details Exercise purpose/form. Self management techniques    Person(s) Educated Patient    Methods Explanation;Demonstration;Tactile cues;Verbal cues    Comprehension Verbalized understanding;Returned demonstration;Verbal cues required;Tactile cues required;Need further instruction               PT Long Term Goals - 04/27/20 1712      PT LONG TERM GOAL #1   Title Be independent with a long-term home exercise program for self-management of symptoms.    Baseline initial HEP provided at IE (03/04/2020); currently participating in appropriate HEP (04/27/2020);    Time 12    Period Weeks    Status Partially Met   TARGET DATE FOR ALL LONG TERM GOALS: 05/27/2020     PT LONG TERM GOAL #2   Title Demonstrate improved FOTO score by 10 units to demonstrate improvement in overall condition and self-reported functional ability.    Baseline deferred to visit  2 (03/04/2020); 54 at visit 2 (03/10/2020); 69 at visit 10 (04/27/2020);    Time 12    Period Weeks    Status Achieved      PT LONG TERM GOAL #3   Title Improve left UE  strength to equal or greater than  4+/5 for improved ability to allow patient to complete valued functional tasks such as lifting, pressing, grasping, holding, etc,  with less difficulty.    Baseline wrist extension 3/5 and reports weakness with repetition (03/05/2020); nearly met - lacks L wrist flexion (04/27/2020);    Time 12    Period Weeks    Status Partially Met      PT LONG TERM GOAL #4   Title Patient will report no longer having pain down the left arm to improve ability to use left UE for functional tasks such as grasping, pressing, stabilizing.    Baseline 8/10 (03/05/2020); 4/10 highest in the last two  weeks (04/27/2020);    Time 12    Period Weeks    Status Partially Met      PT LONG TERM GOAL #5   Title Complete community, work and/or recreational activities without limitation due to current condition.    Baseline Functional Limitations: using left UE for manual tasks, using the phone, work tasks, holding pressure on wounds (unable), holding things without dropping them, opening packages, pulling back a stopper in a syringe, holding a cup, taking lids off, grooming, folding laundry, making the bed, anything that requires two hands and force, gardening, yardwork, raking, clipping, cutting, dressing, showering, sleeping (03/04/2020); improved with all task but still gets tired early in the left UE and switches hands if able - also still cannot button bra and has difficulty putting on a jacket (04/27/2020);    Time 12    Period Weeks    Status Partially Met                 Plan - 05/26/20 2028    Clinical Impression Statement Patient tolerated treatment well overall and felt looser by end of session and overall better. Continues to have pain at left base of neck to palpation and with retraction extension and increased UE symptoms with left sidebending with median nerve in an stretched position. No pain in the hand or arm by end of session. Patient appears to continue benefiting from neural mobility interventions but is still limited by symptoms. Patient would benefit from continued management of limiting condition by skilled physical therapist to address remaining impairments and functional limitations to work towards stated goals and return to PLOF or maximal functional independence.    Personal Factors and Comorbidities Age;Comorbidity 3+;Past/Current Experience;Fitness;Time since onset of injury/illness/exacerbation;Profession;Education    Comorbidities Relevant past medical history and comorbidities include diabetes (currently controlled), idiopathic subglottic tracheal stenosis (surgery  coming up end of month, causes shortness of breath upon exertion), low back pain on left side, obesity, GERD, when she takes caffeine she gest palpitations, high blood pressure.    Examination-Activity Limitations Bathing;Hygiene/Grooming;Lift;Caring for Others;Reach Overhead;Carry;Dressing;Sleep    Examination-Participation Restrictions Community Activity;Occupation;Yard Work;Meal Prep;Laundry;Cleaning;Interpersonal Relationship;Other   using left UE for manual tasks, using the phone, holding pressure on wounds (unable), holding things without dropping them, opening packages, pulling back a stopper in a syringe, holding a cup, taking lids off, making the bed   Stability/Clinical Decision Making Evolving/Moderate complexity    Rehab Potential Good    PT Frequency 2x / week    PT Duration 12 weeks    PT  Treatment/Interventions Moist Heat;Therapeutic exercise;Manual techniques;Dry needling;Electrical Stimulation;ADLs/Self Care Home Management;Cryotherapy;Traction;Therapeutic activities;Neuromuscular re-education;Patient/family education;Passive range of motion;Taping;Joint Manipulations;Spinal Manipulations    PT Next Visit Plan manual therapy, exercise as tolerated    PT Home Exercise Plan thoracic extension, cervical spine retraction extension rotation, median nerve glide    Consulted and Agree with Plan of Care Patient           Patient will benefit from skilled therapeutic intervention in order to improve the following deficits and impairments:  Pain,Decreased activity tolerance,Impaired sensation,Improper body mechanics,Postural dysfunction,Increased muscle spasms,Decreased coordination,Impaired tone,Decreased endurance,Decreased range of motion,Decreased strength,Impaired perceived functional ability,Obesity,Impaired UE functional use  Visit Diagnosis: Radiculopathy, cervical region  Cervicalgia  Nonintractable headache, unspecified chronicity pattern, unspecified headache type  Pain in  thoracic spine     Problem List Patient Active Problem List   Diagnosis Date Noted  . Cervical radiculitis 03/03/2020  . Weakness of left arm 03/03/2020  . Cervicalgia 02/28/2020  . Spondylosis, cervical, 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  . Acute URI 12/04/2019  . Acute maxillary sinusitis 12/04/2019  . Loud snoring 08/01/2019  . B12 deficiency 08/01/2019  . Eczema of left external ear 08/01/2019  . Hyperlipidemia associated with type 2 diabetes mellitus ([place]) 07/31/2019  . Nonallopathic lesion of cervical region 04/29/2019  . Nonallopathic lesion of thoracic region 04/29/2019  . Nonallopathic lesion of rib cage 04/29/2019  . Cervical radiculopathy at C7 03/26/2019  . Abnormal breast exam 01/24/2019  . Elevated ALT measurement 01/24/2019  . Allergic rhinitis due to pollen 08/18/2018  . Abnormal mammogram of right breast 02/27/2018  . Dermatitis 05/02/2017  . Type 2 diabetes mellitus without complications ([place]) 89/37/3428  . Idiopathic subglottic tracheal stenosis 06/21/2016  . Encounter for general adult medical examination with abnormal findings 04/29/2016  . Low back pain without sciatica 04/28/2015  . Class 3 severe obesity without serious comorbidity with body mass index (BMI) of 40.0 to 44.9 in adult ([place]) 04/28/2015  . Pre-syncope 07/14/2014    Everlean Alstrom. Graylon Good, PT, DPT 05/26/20, 8:29 PM  [facility] [address], [place], [address] Phone: [phone]   Fax:  [phone]  Name: JOELYN LOVER MRN: [id number] Date of Birth: 1976-04-22

## 2020-05-28 ENCOUNTER — Other Ambulatory Visit: Payer: Self-pay

## 2020-05-28 ENCOUNTER — Encounter: Payer: Self-pay | Admitting: Physical Therapy

## 2020-05-28 ENCOUNTER — Ambulatory Visit: Payer: No Typology Code available for payment source | Admitting: Physical Therapy

## 2020-05-28 DIAGNOSIS — M5412 Radiculopathy, cervical region: Secondary | ICD-10-CM | POA: Diagnosis not present

## 2020-05-28 DIAGNOSIS — M542 Cervicalgia: Secondary | ICD-10-CM

## 2020-05-28 DIAGNOSIS — R519 Headache, unspecified: Secondary | ICD-10-CM

## 2020-05-28 DIAGNOSIS — M546 Pain in thoracic spine: Secondary | ICD-10-CM

## 2020-05-28 NOTE — Therapy (Signed)
[facility] [address], [place], [address] Phone: [phone]   Fax:  [phone]  Physical Therapy Treatment / Progress Note / Re-Certification Reporting period: 04/29/2020 - 05/28/2020  Patient Details  Name: Lisa [name] MRN: [id number] Date of Birth: 1975/10/26 Referring Provider (PT): Molli Barrows, MD (pain clinic)   Encounter Date: 05/28/2020   PT End of Session - 05/28/20 1732    Visit Number 17    Number of Visits 24    Date for PT Re-Evaluation 08/20/20    Authorization Type [place] [employer] reporting period from 04/29/2020    Progress Note Due on Visit 20    PT Start Time 1720    PT Stop Time 1815    PT Time Calculation (min) 55 min    Activity Tolerance Patient tolerated treatment well    Behavior During Therapy [facility] for tasks assessed/performed           Past Medical History:  Diagnosis Date  . Bronchitis    hx of  . Cervical radiculitis 03/03/2020  . Complication of anesthesia   . DDD (degenerative disc disease), cervical 02/28/2020  . Dysrhythmia    "does not see cardiologist"  . Family history of anesthesia complication    "morphine night terrors"  . GERD (gastroesophageal reflux disease)   . Headache(784.0)    "migraines"  . Hypertension   . PONV (postoperative nausea and vomiting)   . Shortness of breath    "wheezing"  . Weakness of left arm 03/03/2020    Past Surgical History:  Procedure Laterality Date  . CESAREAN SECTION     x 2  . MICROLARYNGOSCOPY WITH DILATION N/A 03/13/2020   Procedure: MICROLARYNGOSCOPY WITH DILATION w/JET VENT MITOMYCIN C;  Surgeon: Melida Quitter, MD;  Location: [facility];  Service: ENT;  Laterality: N/A;  . MICROLARYNGOSCOPY WITH LASER AND BALLOON DILATION N/A 07/20/2012   Procedure: MICROLARYNGOSCOPY WITH LASER AND BALLOON DILATION;  Surgeon: Melida Quitter, MD;  Location: [facility];  Service: ENT;  Laterality: N/A;  WITH JET VENTURI VENTILATION   . TONGUE FLAP RELEASE    . widom extractions      There were no vitals filed for this visit.   Subjective Assessment - 05/28/20 1724    Subjective Patient reports she was a bit sore over her left clavicular region after last session and had some tingling in her middle two fingers yesterday. Rates her pain today 2/10 over the left shoulder and down the left arm a little. Patient report she feels like she is continuing to improve. Felt like she had a setback for a week but she is improving again. States her headaches are better, her pain is better, and the strength in her arm is a lot better. Reports worst pain she has had in the last two week is 3/10. Would like to know that the strength in her arm is restored and not have tingling any more. Would also like to be able to put arm behind back.    Pertinent History Patient is a 45 y.o. female who presents to outpatient physical therapy with a referral for medical diagnosis cervicalgia, cervical degenerative disc disease. This patient's chief complaints consist of increased neck pain and new left UE pain/weakness/paresthesia and left sided rib pain on top of chronic left neck pain radiating to the left face and headache, leading to the following functional deficits: difficulty using left UE for manual tasks, using the phone, work tasks, holding  pressure on wounds (unable), holding things without dropping them, opening packages, pulling back a stopper in a syringe, holding a cup, taking lids off, grooming, folding laundry, making the bed, anything that requires two hands and force, gardening, yardwork, raking, clipping, cutting, dressing, showering, sleeping. Relevant past medical history and comorbidities include diabetes (currently controlled), idiopathic subglottic tracheal stenosis (surgery coming up end of month, causes shortness of breath upon exertion), low back pain on left side, obesity, GERD, when she takes caffeine she gest palpitations, high blood  pressure.  Patient denies hx of cancer, stroke, seizures, lung problem (besides trachea problem), major cardiac events, unexplained weight loss, changes in bowel or bladder problems, new onset stumbling or dropping things apart from described below.    Limitations House hold activities;Other (comment);Lifting   usinghands and force, gardening, yardwork, raking, clipping, cutting, dressing, showering, sleeping.   Diagnostic tests C-spine MRI report 02/20/2020: "IMPRESSION:1. Minimal disc osteophyte complex with left uncovertebral spurringat C5-C6 resulting in mild left foraminal stenosis.2. No canal stenosis at any level."    Patient Stated Goals "to not have arm pain"    Currently in Pain? Yes    Pain Score 2     Effect of Pain on Daily Activities Reports all of the difficulties listed previously are better but her left hand gets tired and she tries to switch to the other hand as available, but not as much as before. Still has difficulty putting arm behind back to button bra or put on jacket. Still cannot hold a pot or bowl like when scraping out batter while making cake.           OBJECTIVE  FOTO = 61  SPINE MOTION Cervical Spine AROM *Indicates pain Flexion: =55 Extension: =60. Rotation: R=75, L =78 sore at left neck  Side Flexion: R=45,  L =35  Protrusion = WNL Retraction = ear over acromion, popping noted.  MUSCLE PERFORMANCE (MMT):  *Indicates pain 03/05/20 04/27/20 05/28/20  Joint/Motion R/L R/L R/L  Shoulder     Flexion 4+/4 4+/4+ 5/5  Abduction 5/4+ 5/4+ 5/5  External rotation 4/4+ 4+/4+ 4+/5  Internal rotation 4+/4+ 4+/4+ 5/5  Extension / / /  Elbow     Flexion 4+/4+ 5/4+ 5/5  Extension 4+/4+ 4+/4+ 5/5  Wrist     Flexion 4+/4+ 4+/4* 4+/4+  Extension 5/3 5/4+ 5/4  Grip strength (in pounds, average of three measures).  - R: (80+75+73)/3 = 76 - L: (65+70+68)/ 3 = 67.7  NEURODYNAMIC TESTS ULNT positive for concordant paresthesia and neural tension  left compared to right in medial and radial nerve distribution.   FUNCTIONAL TESTS - reach behind back: difficulty lifting L hand off low back and getting left hand across back or up back equally compared to R with AROM.   Objective measurements completed on examination: See above findings.    TREATMENT: DOES have latex allergy  Therapeutic exercise:to centralize symptoms and improve ROM, strength, muscular endurance, and activity tolerance required for successful completion of functional activities. -seated thoracic extension over back of chair,x20 - seated cervical retraction with self overpressure x 20 - measurements to assess progress (see above).  - AAROM L shoulder extension with stick, at doorway 2x10  - AAROM L shoulder functional IR with towel x 10 - L shoulder AAROM functional IR lift off low back x 10 (difficult but able).  - Education on HEP with stretches and exercises to help move L UE behind back.   Manual therapy:to reduce pain and tissue tension, improve range  of motion, neuromodulation, in order to promote improved ability to complete functional activities.  Prone position:  - CPA grade III-VI with KE wedge along thoracic spine and to mid cervical spine. Cavitations that felt good in mid to lower thoracic spine.   Supine position:  - left UE nerve glides moving in and out of tension isolating movement at wrist, elbow, shoulder and neck x15 reps each except neck which was 10 reps. For radial and median nerve. Median nerve no longer increased pain with neck flexion towards more than away with least tension in neutral.   HOME EXERCISE PROGRAM Access Code: [email or web address] URL: [email or web address] Date: 05/20/2020 Prepared by: Rosita Kea  Exercises Cervical Retraction with Overpressure - 3 x daily - 2 sets - 10 reps Radial Nerve Flossing - 3 x daily - 15 reps Seated Cervical Flexion AROM - 6 x daily - 1 sets - 10 reps Standing Radial Nerve  Glide - 2 x daily - 15 reps     PT Education - 05/28/20 1730    Education Details Exercise purpose/form. Self management techniques    Person(s) Educated Patient    Methods Explanation;Demonstration;Tactile cues;Verbal cues    Comprehension Verbalized understanding;Returned demonstration;Verbal cues required;Tactile cues required;Need further instruction               PT Long Term Goals - 05/28/20 1842      PT LONG TERM GOAL #1   Title Be independent with a long-term home exercise program for self-management of symptoms.    Baseline initial HEP provided at IE (03/04/2020); currently participating in appropriate HEP (04/27/2020); continues to participate in appropriate HEP (05/28/2020);    Time 12    Period Weeks    Status Partially Met   TARGET DATE FOR ALL LONG TERM GOALS: 05/27/2020. TARGET DATE FOR UNMET GOALS UPDATED TO 08/20/2020     PT LONG TERM GOAL #2   Title Demonstrate improved FOTO score by 10 units to demonstrate improvement in overall condition and self-reported functional ability.    Baseline deferred to visit 2 (03/04/2020); 54 at visit 2 (03/10/2020); 69 at visit 10 (04/27/2020); 61 at visit 17 (05/28/2020);    Time 12    Period Weeks    Status Partially Met      PT LONG TERM GOAL #3   Title Improve left UE  strength to equal or greater than  4+/5 for improved ability to allow patient to complete valued functional tasks such as lifting, pressing, grasping, holding, etc,  with less difficulty.    Baseline wrist extension 3/5 and reports weakness with repetition (03/05/2020); nearly met - lacks L wrist flexion (04/27/2020); met except left wrist extension (05/28/2020);    Time 12    Period Weeks    Status Partially Met      PT LONG TERM GOAL #4   Title Patient will report no longer having pain down the left arm to improve ability to use left UE for functional tasks such as grasping, pressing, stabilizing.    Baseline 8/10 (03/05/2020); 4/10 highest in the last two  weeks (04/27/2020); 3/10 highest in the last two weeks (05/28/2020);    Time 12    Period Weeks    Status Partially Met      PT LONG TERM GOAL #5   Title Complete community, work and/or recreational activities without limitation due to current condition.    Baseline Functional Limitations: using left UE for manual tasks, using the phone, work tasks, holding pressure on wounds (unable), holding things  without dropping them, opening packages, pulling back a stopper in a syringe, holding a cup, taking lids off, grooming, folding laundry, making the bed, anything that requires two hands and force, gardening, yardwork, raking, clipping, cutting, dressing, showering, sleeping (03/04/2020); improved with all task but still gets tired early in the left UE and switches hands if able - also still cannot button bra and has difficulty putting on a jacket (04/27/2020); continues to improve but still has difficulty with jacket, holding pots/pans (05/28/2020);    Time 12    Period Weeks    Status Partially Met                 Plan - 05/28/20 1841    Clinical Impression Statement Patient has attended 17 physical therapy sessions and continues to make progress towards goals. After her last progress note, she felt like she had a flair in symptoms and regressed slightly before continuing to make progress again. She demonstrates improved cervical AROM, UE strength, and reports improved function and strength in her daily activities. She continues to experience intermittent paresthesia in her fingers and pain at the left neck and shoulder as well as less strength than she had prior to injury during daily functional activities as well as difficulty getting garments on/off due to difficulty using left UE behind back. Patient would benefit from continued management of limiting condition by skilled physical therapist to address remaining impairments and functional limitations to work towards stated goals and return to  PLOF or maximal functional independence.    Personal Factors and Comorbidities Age;Comorbidity 3+;Past/Current Experience;Fitness;Time since onset of injury/illness/exacerbation;Profession;Education    Comorbidities Relevant past medical history and comorbidities include diabetes (currently controlled), idiopathic subglottic tracheal stenosis (surgery coming up end of month, causes shortness of breath upon exertion), low back pain on left side, obesity, GERD, when she takes caffeine she gest palpitations, high blood pressure.    Examination-Activity Limitations Bathing;Hygiene/Grooming;Lift;Caring for Others;Reach Overhead;Carry;Dressing;Sleep    Examination-Participation Restrictions Community Activity;Occupation;Yard Work;Meal Prep;Laundry;Cleaning;Interpersonal Relationship;Other   using left UE for manual tasks, using the phone, holding pressure on wounds (unable), holding things without dropping them, opening packages, pulling back a stopper in a syringe, holding a cup, taking lids off, making the bed   Stability/Clinical Decision Making Evolving/Moderate complexity    Rehab Potential Good    PT Frequency 2x / week    PT Duration 12 weeks    PT Treatment/Interventions Moist Heat;Therapeutic exercise;Manual techniques;Dry needling;Electrical Stimulation;ADLs/Self Care Home Management;Cryotherapy;Traction;Therapeutic activities;Neuromuscular re-education;Patient/family education;Passive range of motion;Taping;Joint Manipulations;Spinal Manipulations    PT Next Visit Plan manual therapy, exercise as tolerated    PT Home Exercise Plan thoracic extension, cervical spine retraction extension rotation, median nerve glide    Consulted and Agree with Plan of Care Patient           Patient will benefit from skilled therapeutic intervention in order to improve the following deficits and impairments:  Pain,Decreased activity tolerance,Impaired sensation,Improper body mechanics,Postural  dysfunction,Increased muscle spasms,Decreased coordination,Impaired tone,Decreased endurance,Decreased range of motion,Decreased strength,Impaired perceived functional ability,Obesity,Impaired UE functional use  Visit Diagnosis: Radiculopathy, cervical region  Cervicalgia  Nonintractable headache, unspecified chronicity pattern, unspecified headache type  Pain in thoracic spine     Problem List Patient Active Problem List   Diagnosis Date Noted  . Cervical radiculitis 03/03/2020  . Weakness of left arm 03/03/2020  . Cervicalgia 02/28/2020  . Spondylosis, cervical, 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  . Acute URI 12/04/2019  . Acute maxillary sinusitis 12/04/2019  . Loud snoring 08/01/2019  . B12  deficiency 08/01/2019  . Eczema of left external ear 08/01/2019  . Hyperlipidemia associated with type 2 diabetes mellitus ([place]) 07/31/2019  . Nonallopathic lesion of cervical region 04/29/2019  . Nonallopathic lesion of thoracic region 04/29/2019  . Nonallopathic lesion of rib cage 04/29/2019  . Cervical radiculopathy at C7 03/26/2019  . Abnormal breast exam 01/24/2019  . Elevated ALT measurement 01/24/2019  . Allergic rhinitis due to pollen 08/18/2018  . Abnormal mammogram of right breast 02/27/2018  . Dermatitis 05/02/2017  . Type 2 diabetes mellitus without complications ([place]) 68/34/1962  . Idiopathic subglottic tracheal stenosis 06/21/2016  . Encounter for general adult medical examination with abnormal findings 04/29/2016  . Low back pain without sciatica 04/28/2015  . Class 3 severe obesity without serious comorbidity with body mass index (BMI) of 40.0 to 44.9 in adult ([place]) 04/28/2015  . Pre-syncope 07/14/2014    Everlean Alstrom. Graylon Good, PT, DPT 05/28/20, 6:45 PM  [facility] [address], [place], [address] Phone: [phone]   Fax:  [phone]  Name: Lisa [name] MRN: [id number] Date of Birth:  1976/01/21

## 2020-06-02 ENCOUNTER — Ambulatory Visit: Payer: No Typology Code available for payment source | Admitting: Physical Therapy

## 2020-06-04 ENCOUNTER — Ambulatory Visit: Payer: No Typology Code available for payment source | Admitting: Physical Therapy

## 2020-06-09 ENCOUNTER — Ambulatory Visit: Payer: No Typology Code available for payment source | Admitting: Physical Therapy

## 2020-06-09 ENCOUNTER — Other Ambulatory Visit: Payer: Self-pay

## 2020-06-09 ENCOUNTER — Encounter: Payer: Self-pay | Admitting: Physical Therapy

## 2020-06-09 DIAGNOSIS — M542 Cervicalgia: Secondary | ICD-10-CM

## 2020-06-09 DIAGNOSIS — M5412 Radiculopathy, cervical region: Secondary | ICD-10-CM

## 2020-06-09 DIAGNOSIS — M546 Pain in thoracic spine: Secondary | ICD-10-CM

## 2020-06-09 DIAGNOSIS — R519 Headache, unspecified: Secondary | ICD-10-CM

## 2020-06-09 NOTE — Therapy (Addendum)
[facility] [address], [place], [address] Phone: [phone]   Fax:  [phone]  Physical Therapy Treatment  Patient Details  Name: Lisa [name] MRN: [id number] Date of Birth: 1975-12-27 Referring Provider (PT): Molli Barrows, MD (pain clinic)   Encounter Date: 06/09/2020   PT End of Session - 06/09/20 1814    Visit Number 18    Number of Visits 24    Date for PT Re-Evaluation 08/20/20    Authorization Type [place] [employer] reporting period from 04/29/2020    Progress Note Due on Visit 20    PT Start Time 1655    PT Stop Time 6962    PT Time Calculation (min) 60 min    Activity Tolerance Patient tolerated treatment well;No increased pain    Behavior During Therapy WFL for tasks assessed/performed           Past Medical History:  Diagnosis Date  . Bronchitis    hx of  . Cervical radiculitis 03/03/2020  . Complication of anesthesia   . DDD (degenerative disc disease), cervical 02/28/2020  . Dysrhythmia    "does not see cardiologist"  . Family history of anesthesia complication    "morphine night terrors"  . GERD (gastroesophageal reflux disease)   . Headache(784.0)    "migraines"  . Hypertension   . PONV (postoperative nausea and vomiting)   . Shortness of breath    "wheezing"  . Weakness of left arm 03/03/2020    Past Surgical History:  Procedure Laterality Date  . CESAREAN SECTION     x 2  . MICROLARYNGOSCOPY WITH DILATION N/A 03/13/2020   Procedure: MICROLARYNGOSCOPY WITH DILATION w/JET VENT MITOMYCIN C;  Surgeon: Melida Quitter, MD;  Location: [facility];  Service: ENT;  Laterality: N/A;  . MICROLARYNGOSCOPY WITH LASER AND BALLOON DILATION N/A 07/20/2012   Procedure: MICROLARYNGOSCOPY WITH LASER AND BALLOON DILATION;  Surgeon: Melida Quitter, MD;  Location: [facility];  Service: ENT;  Laterality: N/A;  WITH JET VENTURI VENTILATION  . TONGUE FLAP RELEASE    . widom extractions       There were no vitals filed for this visit.   Subjective Assessment - 06/09/20 1700    Subjective Patient reports she is feeling pretty good currently. She hs a little pain in the left upper trap region 1/10 pain upon arrival and some eye pain this morning but it has gone away. Work has been easier. She has noticed improvements in the stretches reaching behind. Easier to get her coat on but not normal yet.    Pertinent History Patient is a 45 y.o. female who presents to outpatient physical therapy with a referral for medical diagnosis cervicalgia, cervical degenerative disc disease. This patient's chief complaints consist of increased neck pain and new left UE pain/weakness/paresthesia and left sided rib pain on top of chronic left neck pain radiating to the left face and headache, leading to the following functional deficits: difficulty using left UE for manual tasks, using the phone, work tasks, holding pressure on wounds (unable), holding things without dropping them, opening packages, pulling back a stopper in a syringe, holding a cup, taking lids off, grooming, folding laundry, making the bed, anything that requires two hands and force, gardening, yardwork, raking, clipping, cutting, dressing, showering, sleeping. Relevant past medical history and comorbidities include diabetes (currently controlled), idiopathic subglottic tracheal stenosis (surgery coming up end of month, causes shortness of breath upon exertion), low back pain on left side,  obesity, GERD, when she takes caffeine she gest palpitations, high blood pressure.  Patient denies hx of cancer, stroke, seizures, lung problem (besides trachea problem), major cardiac events, unexplained weight loss, changes in bowel or bladder problems, new onset stumbling or dropping things apart from described below.    Limitations House hold activities;Other (comment);Lifting   usinghands and force, gardening, yardwork, raking, clipping, cutting, dressing,  showering, sleeping.   Diagnostic tests C-spine MRI report 02/20/2020: "IMPRESSION:1. Minimal disc osteophyte complex with left uncovertebral spurringat C5-C6 resulting in mild left foraminal stenosis.2. No canal stenosis at any level."    Patient Stated Goals "to not have arm pain"    Currently in Pain? Yes    Pain Score 1             TREATMENT: DOES have latex allergy  Therapeutic exercise:to centralize symptoms and improve ROM, strength, muscular endurance, and activity tolerance required for successful completion of functional activities. -seated thoracic extension over back of chair,x20 - seated cervical retraction with self overpressure x20 - supine chin tucks x 10 - L shoulder AAROM functional IR lift off low back x5 (improved).    Manual therapy:to reduce pain and tissue tension, improve range of motion, neuromodulation, in order to promote improved ability to complete functional activities.  Prone position:  - CPA grade III-VIwith KE wedgealong thoracic spine and to mid cervical spine. Cavitations that felt good in mid to lower thoracic spine.  - STM to posterior cervical spine musculature, focusing on L paraspinals and upper trap/levator scap region.   Supine position:  - left UE nerve glides moving in and out of tension isolating movement at wrist, elbow, shoulder and neck x15 reps each except neck which was 10 reps. For radial and median nerve. Median nerve no longer increased pain with neck flexion towards more than away with least tension in neutral.   Modality: (unbilled) Dry needling performed to posterior cervical spine region to decrease pain and spasms along patient's left neck and UE with patient in prone lying utilizing (1) dry needle(s) .25m x 663mwith (1) stick to the left cervical paraspinals using shelf technique and (2) sticks to L cervical spine multifidi at approximately C6 and C5 and (2) sticks along the left upper trap. An additional needle  .253m 75m98mserted 2 times along left upper trap.  Patient educated about the risks and benefits from therapy and verbally consents to treatment.  HOME EXERCISE PROGRAM Access Code: [email or web address]: [email or web address] Date: 05/20/2020 Prepared by: SaraRosita Keaercises Cervical Retraction with Overpressure - 3 x daily - 2 sets - 10 reps Radial Nerve Flossing - 3 x daily - 15 reps Seated Cervical Flexion AROM - 6 x daily - 1 sets - 10 reps Standing Radial Nerve Glide - 2 x daily - 15 reps    PT Education - 06/09/20 1814    Education Details Exercise purpose/form. Self management techniques    Person(s) Educated Patient    Methods Explanation;Demonstration;Tactile cues;Verbal cues    Comprehension Verbalized understanding;Returned demonstration;Verbal cues required;Tactile cues required;Need further instruction               PT Long Term Goals - 05/28/20 1842      PT LONG TERM GOAL #1   Title Be independent with a long-term home exercise program for self-management of symptoms.    Baseline initial HEP provided at IE (03/04/2020); currently participating in appropriate HEP (04/27/2020); continues to participate in appropriate HEP (05/28/2020);    Time 12  Period Weeks    Status Partially Met   TARGET DATE FOR ALL LONG TERM GOALS: 05/27/2020. TARGET DATE FOR UNMET GOALS UPDATED TO 08/20/2020     PT LONG TERM GOAL #2   Title Demonstrate improved FOTO score by 10 units to demonstrate improvement in overall condition and self-reported functional ability.    Baseline deferred to visit 2 (03/04/2020); 54 at visit 2 (03/10/2020); 69 at visit 10 (04/27/2020); 61 at visit 17 (05/28/2020);    Time 12    Period Weeks    Status Partially Met      PT LONG TERM GOAL #3   Title Improve left UE  strength to equal or greater than  4+/5 for improved ability to allow patient to complete valued functional tasks such as lifting, pressing, grasping, holding, etc,  with less  difficulty.    Baseline wrist extension 3/5 and reports weakness with repetition (03/05/2020); nearly met - lacks L wrist flexion (04/27/2020); met except left wrist extension (05/28/2020);    Time 12    Period Weeks    Status Partially Met      PT LONG TERM GOAL #4   Title Patient will report no longer having pain down the left arm to improve ability to use left UE for functional tasks such as grasping, pressing, stabilizing.    Baseline 8/10 (03/05/2020); 4/10 highest in the last two weeks (04/27/2020); 3/10 highest in the last two weeks (05/28/2020);    Time 12    Period Weeks    Status Partially Met      PT LONG TERM GOAL #5   Title Complete community, work and/or recreational activities without limitation due to current condition.    Baseline Functional Limitations: using left UE for manual tasks, using the phone, work tasks, holding pressure on wounds (unable), holding things without dropping them, opening packages, pulling back a stopper in a syringe, holding a cup, taking lids off, grooming, folding laundry, making the bed, anything that requires two hands and force, gardening, yardwork, raking, clipping, cutting, dressing, showering, sleeping (03/04/2020); improved with all task but still gets tired early in the left UE and switches hands if able - also still cannot button bra and has difficulty putting on a jacket (04/27/2020); continues to improve but still has difficulty with jacket, holding pots/pans (05/28/2020);    Time 12    Period Weeks    Status Partially Met                 Plan - 06/09/20 1816    Clinical Impression Statement Patient tolerated treatment well overall. Continues to be tight and tender at the left upper trap region and has some neural tension in the left UE, but continues to improve from previous sessions. Also demonstrates improved ability to lift L hand off low back demonstrating improved ROM and strength. Reported twitch response to needles at the  cervical spine multifidi but with no significant twitch response along upper trap. Patient would benefit from continued management of limiting condition by skilled physical therapist to address remaining impairments and functional limitations to work towards stated goals and return to PLOF or maximal functional independence.    Personal Factors and Comorbidities Age;Comorbidity 3+;Past/Current Experience;Fitness;Time since onset of injury/illness/exacerbation;Profession;Education    Comorbidities Relevant past medical history and comorbidities include diabetes (currently controlled), idiopathic subglottic tracheal stenosis (surgery coming up end of month, causes shortness of breath upon exertion), low back pain on left side, obesity, GERD, when she takes caffeine she gest palpitations, high  blood pressure.    Examination-Activity Limitations Bathing;Hygiene/Grooming;Lift;Caring for Others;Reach Overhead;Carry;Dressing;Sleep    Examination-Participation Restrictions Community Activity;Occupation;Yard Work;Meal Prep;Laundry;Cleaning;Interpersonal Relationship;Other   using left UE for manual tasks, using the phone, holding pressure on wounds (unable), holding things without dropping them, opening packages, pulling back a stopper in a syringe, holding a cup, taking lids off, making the bed   Stability/Clinical Decision Making Evolving/Moderate complexity    Rehab Potential Good    PT Frequency 2x / week    PT Duration 12 weeks    PT Treatment/Interventions Moist Heat;Therapeutic exercise;Manual techniques;Dry needling;Electrical Stimulation;ADLs/Self Care Home Management;Cryotherapy;Traction;Therapeutic activities;Neuromuscular re-education;Patient/family education;Passive range of motion;Taping;Joint Manipulations;Spinal Manipulations    PT Next Visit Plan manual therapy, exercise as tolerated    PT Home Exercise Plan thoracic extension, cervical spine retraction extension rotation, median nerve glide     Consulted and Agree with Plan of Care Patient           Patient will benefit from skilled therapeutic intervention in order to improve the following deficits and impairments:  Pain,Decreased activity tolerance,Impaired sensation,Improper body mechanics,Postural dysfunction,Increased muscle spasms,Decreased coordination,Impaired tone,Decreased endurance,Decreased range of motion,Decreased strength,Impaired perceived functional ability,Obesity,Impaired UE functional use  Visit Diagnosis: Radiculopathy, cervical region  Cervicalgia  Nonintractable headache, unspecified chronicity pattern, unspecified headache type  Pain in thoracic spine     Problem List Patient Active Problem List   Diagnosis Date Noted  . Cervical radiculitis 03/03/2020  . Weakness of left arm 03/03/2020  . Cervicalgia 02/28/2020  . Spondylosis, cervical, 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  . Acute URI 12/04/2019  . Acute maxillary sinusitis 12/04/2019  . Loud snoring 08/01/2019  . B12 deficiency 08/01/2019  . Eczema of left external ear 08/01/2019  . Hyperlipidemia associated with type 2 diabetes mellitus ([place]) 07/31/2019  . Nonallopathic lesion of cervical region 04/29/2019  . Nonallopathic lesion of thoracic region 04/29/2019  . Nonallopathic lesion of rib cage 04/29/2019  . Cervical radiculopathy at C7 03/26/2019  . Abnormal breast exam 01/24/2019  . Elevated ALT measurement 01/24/2019  . Allergic rhinitis due to pollen 08/18/2018  . Abnormal mammogram of right breast 02/27/2018  . Dermatitis 05/02/2017  . Type 2 diabetes mellitus without complications ([place]) 44/96/7591  . Idiopathic subglottic tracheal stenosis 06/21/2016  . Encounter for general adult medical examination with abnormal findings 04/29/2016  . Low back pain without sciatica 04/28/2015  . Class 3 severe obesity without serious comorbidity with body mass index (BMI) of 40.0 to 44.9 in adult ([place]) 04/28/2015  . Pre-syncope 07/14/2014    Everlean Alstrom. Graylon Good, PT, DPT 06/09/20, 6:23 PM  [facility] [address] [place], [address] Phone: [phone]   Fax:  [phone]  Name: Lisa [name] MRN: [id number] Date of Birth: [birth year]

## 2020-06-11 ENCOUNTER — Ambulatory Visit: Payer: No Typology Code available for payment source | Admitting: Physical Therapy

## 2020-06-11 ENCOUNTER — Encounter: Payer: Self-pay | Admitting: Physical Therapy

## 2020-06-11 ENCOUNTER — Other Ambulatory Visit: Payer: Self-pay

## 2020-06-11 DIAGNOSIS — M546 Pain in thoracic spine: Secondary | ICD-10-CM

## 2020-06-11 DIAGNOSIS — R519 Headache, unspecified: Secondary | ICD-10-CM

## 2020-06-11 DIAGNOSIS — M5412 Radiculopathy, cervical region: Secondary | ICD-10-CM | POA: Diagnosis not present

## 2020-06-11 DIAGNOSIS — M542 Cervicalgia: Secondary | ICD-10-CM

## 2020-06-11 NOTE — Therapy (Signed)
[facility] [address] [place], [address] Phone: [phone]   Fax:  [phone]  Physical Therapy Treatment  Patient Details  Name: Lisa [name] MRN: [id number] Date of Birth: 1975/10/15 Referring Provider (PT): Molli Barrows, MD (pain clinic)   Encounter Date: 06/11/2020   PT End of Session - 06/11/20 1841    Visit Number 19    Number of Visits 24    Date for PT Re-Evaluation 08/20/20    Authorization Type [place] [employer] reporting period from 04/29/2020    Progress Note Due on Visit 20    PT Start Time 1750    PT Stop Time 1830    PT Time Calculation (min) 40 min    Activity Tolerance Patient tolerated treatment well;No increased pain    Behavior During Therapy WFL for tasks assessed/performed           Past Medical History:  Diagnosis Date  . Bronchitis    hx of  . Cervical radiculitis 03/03/2020  . Complication of anesthesia   . DDD (degenerative disc disease), cervical 02/28/2020  . Dysrhythmia    "does not see cardiologist"  . Family history of anesthesia complication    "morphine night terrors"  . GERD (gastroesophageal reflux disease)   . Headache(784.0)    "migraines"  . Hypertension   . PONV (postoperative nausea and vomiting)   . Shortness of breath    "wheezing"  . Weakness of left arm 03/03/2020    Past Surgical History:  Procedure Laterality Date  . CESAREAN SECTION     x 2  . MICROLARYNGOSCOPY WITH DILATION N/A 03/13/2020   Procedure: MICROLARYNGOSCOPY WITH DILATION w/JET VENT MITOMYCIN C;  Surgeon: Melida Quitter, MD;  Location: [facility];  Service: ENT;  Laterality: N/A;  . MICROLARYNGOSCOPY WITH LASER AND BALLOON DILATION N/A 07/20/2012   Procedure: MICROLARYNGOSCOPY WITH LASER AND BALLOON DILATION;  Surgeon: Melida Quitter, MD;  Location: [facility];  Service: ENT;  Laterality: N/A;  WITH JET VENTURI VENTILATION  . TONGUE FLAP RELEASE    . widom extractions       There were no vitals filed for this visit.   Subjective Assessment - 06/11/20 1754    Subjective Patient reports her pain is 1/10 currently over her left upper trap. She awoke with her usual headache around the eye but it was so intense she could not work. She is feeling better today.    Pertinent History Patient is a 45 y.o. female who presents to outpatient physical therapy with a referral for medical diagnosis cervicalgia, cervical degenerative disc disease. This patient's chief complaints consist of increased neck pain and new left UE pain/weakness/paresthesia and left sided rib pain on top of chronic left neck pain radiating to the left face and headache, leading to the following functional deficits: difficulty using left UE for manual tasks, using the phone, work tasks, holding pressure on wounds (unable), holding things without dropping them, opening packages, pulling back a stopper in a syringe, holding a cup, taking lids off, grooming, folding laundry, making the bed, anything that requires two hands and force, gardening, yardwork, raking, clipping, cutting, dressing, showering, sleeping. Relevant past medical history and comorbidities include diabetes (currently controlled), idiopathic subglottic tracheal stenosis (surgery coming up end of month, causes shortness of breath upon exertion), low back pain on left side, obesity, GERD, when she takes caffeine she gest palpitations, high blood pressure.  Patient denies hx of cancer, stroke, seizures, lung problem (  besides trachea problem), major cardiac events, unexplained weight loss, changes in bowel or bladder problems, new onset stumbling or dropping things apart from described below.    Limitations House hold activities;Other (comment);Lifting   usinghands and force, gardening, yardwork, raking, clipping, cutting, dressing, showering, sleeping.   Diagnostic tests C-spine MRI report 02/20/2020: "IMPRESSION:1. Minimal disc osteophyte complex with  left uncovertebral spurringat C5-C6 resulting in mild left foraminal stenosis.2. No canal stenosis at any level."    Patient Stated Goals "to not have arm pain"    Currently in Pain? Yes    Pain Score 1           TREATMENT: DOES have latex allergy  Therapeutic exercise:to centralize symptoms and improve ROM, strength, muscular endurance, and activity tolerance required for successful completion of functional activities. -seated thoracic extension over back of chair,x20 - seated cervical retraction with self overpressure x20   - supine chin tuck, 3x10 with one second hold.  Manual therapy:to reduce pain and tissue tension, improve range of motion, neuromodulation, in order to promote improved ability to complete functional activities.  Prone position:  - CPA grade III-VIwith KE wedgealong thoracic spine and to mid cervical spine. Cavitations that felt good in mid to lower thoracic spine.  - STM to posterior cervical spine musculature, focusing on paraspinals.   Supine position:  - left UE nerve glides moving in and out of tension isolating movement at wrist, elbow, shoulder and neck x15 reps each except neck which was 10 reps. For radial and median nerve. Median nerveno longerincreased pain with neck flexion towards more than away with least tension in neutral.  - manual intermittent traction at the cervical spine (tender at contact points at suboccipital region).   HOME EXERCISE PROGRAM Access Code: [email or web address] URL: [email or web address] Date: 05/20/2020 Prepared by: Rosita Kea  Exercises Cervical Retraction with Overpressure - 3 x daily - 2 sets - 10 reps Radial Nerve Flossing - 3 x daily - 15 reps Seated Cervical Flexion AROM - 6 x daily - 1 sets - 10 reps Standing Radial Nerve Glide - 2 x daily - 15 reps    PT Education - 06/11/20 1822    Education Details Exercise purpose/form. Self management techniques    Person(s) Educated Patient     Methods Explanation;Demonstration;Verbal cues    Comprehension Verbalized understanding;Returned demonstration;Verbal cues required;Tactile cues required;Need further instruction               PT Long Term Goals - 05/28/20 1842      PT LONG TERM GOAL #1   Title Be independent with a long-term home exercise program for self-management of symptoms.    Baseline initial HEP provided at IE (03/04/2020); currently participating in appropriate HEP (04/27/2020); continues to participate in appropriate HEP (05/28/2020);    Time 12    Period Weeks    Status Partially Met   TARGET DATE FOR ALL LONG TERM GOALS: 05/27/2020. TARGET DATE FOR UNMET GOALS UPDATED TO 08/20/2020     PT LONG TERM GOAL #2   Title Demonstrate improved FOTO score by 10 units to demonstrate improvement in overall condition and self-reported functional ability.    Baseline deferred to visit 2 (03/04/2020); 54 at visit 2 (03/10/2020); 69 at visit 10 (04/27/2020); 61 at visit 17 (05/28/2020);    Time 12    Period Weeks    Status Partially Met      PT LONG TERM GOAL #3   Title Improve left UE  strength to equal or greater than  4+/5 for improved ability to allow patient to complete valued functional tasks such as lifting, pressing, grasping, holding, etc,  with less difficulty.    Baseline wrist extension 3/5 and reports weakness with repetition (03/05/2020); nearly met - lacks L wrist flexion (04/27/2020); met except left wrist extension (05/28/2020);    Time 12    Period Weeks    Status Partially Met      PT LONG TERM GOAL #4   Title Patient will report no longer having pain down the left arm to improve ability to use left UE for functional tasks such as grasping, pressing, stabilizing.    Baseline 8/10 (03/05/2020); 4/10 highest in the last two weeks (04/27/2020); 3/10 highest in the last two weeks (05/28/2020);    Time 12    Period Weeks    Status Partially Met      PT LONG TERM GOAL #5   Title Complete community, work  and/or recreational activities without limitation due to current condition.    Baseline Functional Limitations: using left UE for manual tasks, using the phone, work tasks, holding pressure on wounds (unable), holding things without dropping them, opening packages, pulling back a stopper in a syringe, holding a cup, taking lids off, grooming, folding laundry, making the bed, anything that requires two hands and force, gardening, yardwork, raking, clipping, cutting, dressing, showering, sleeping (03/04/2020); improved with all task but still gets tired early in the left UE and switches hands if able - also still cannot button bra and has difficulty putting on a jacket (04/27/2020); continues to improve but still has difficulty with jacket, holding pots/pans (05/28/2020);    Time 12    Period Weeks    Status Partially Met                 Plan - 06/11/20 1840    Clinical Impression Statement Patient tolerated treatment well overall and continues to feel better after nerve glide interventions. Had massage therapy session directly before PT, so did not focus on soft tissue aspects as much. Headache appears to have been exacerbated by TDN to the left upper trap but she had decreased paresthesia yesterday and today. Patient would benefit from continued management of limiting condition by skilled physical therapist to address remaining impairments and functional limitations to work towards stated goals and return to PLOF or maximal functional independence.    Personal Factors and Comorbidities Age;Comorbidity 3+;Past/Current Experience;Fitness;Time since onset of injury/illness/exacerbation;Profession;Education    Comorbidities Relevant past medical history and comorbidities include diabetes (currently controlled), idiopathic subglottic tracheal stenosis (surgery coming up end of month, causes shortness of breath upon exertion), low back pain on left side, obesity, GERD, when she takes caffeine she gest  palpitations, high blood pressure.    Examination-Activity Limitations Bathing;Hygiene/Grooming;Lift;Caring for Others;Reach Overhead;Carry;Dressing;Sleep    Examination-Participation Restrictions Community Activity;Occupation;Yard Work;Meal Prep;Laundry;Cleaning;Interpersonal Relationship;Other   using left UE for manual tasks, using the phone, holding pressure on wounds (unable), holding things without dropping them, opening packages, pulling back a stopper in a syringe, holding a cup, taking lids off, making the bed   Stability/Clinical Decision Making Evolving/Moderate complexity    Rehab Potential Good    PT Frequency 2x / week    PT Duration 12 weeks    PT Treatment/Interventions Moist Heat;Therapeutic exercise;Manual techniques;Dry needling;Electrical Stimulation;ADLs/Self Care Home Management;Cryotherapy;Traction;Therapeutic activities;Neuromuscular re-education;Patient/family education;Passive range of motion;Taping;Joint Manipulations;Spinal Manipulations    PT Next Visit Plan manual therapy, exercise as tolerated    PT Home Exercise Plan thoracic extension, cervical spine retraction extension  rotation, median nerve glide    Consulted and Agree with Plan of Care Patient           Patient will benefit from skilled therapeutic intervention in order to improve the following deficits and impairments:  Pain,Decreased activity tolerance,Impaired sensation,Improper body mechanics,Postural dysfunction,Increased muscle spasms,Decreased coordination,Impaired tone,Decreased endurance,Decreased range of motion,Decreased strength,Impaired perceived functional ability,Obesity,Impaired UE functional use  Visit Diagnosis: Radiculopathy, cervical region  Cervicalgia  Nonintractable headache, unspecified chronicity pattern, unspecified headache type  Pain in thoracic spine     Problem List Patient Active Problem List   Diagnosis Date Noted  . Cervical radiculitis 03/03/2020  . Weakness of  left arm 03/03/2020  . Cervicalgia 02/28/2020  . Spondylosis, cervical, 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  . Acute URI 12/04/2019  . Acute maxillary sinusitis 12/04/2019  . Loud snoring 08/01/2019  . B12 deficiency 08/01/2019  . Eczema of left external ear 08/01/2019  . Hyperlipidemia associated with type 2 diabetes mellitus ([place]) 07/31/2019  . Nonallopathic lesion of cervical region 04/29/2019  . Nonallopathic lesion of thoracic region 04/29/2019  . Nonallopathic lesion of rib cage 04/29/2019  . Cervical radiculopathy at C7 03/26/2019  . Abnormal breast exam 01/24/2019  . Elevated ALT measurement 01/24/2019  . Allergic rhinitis due to pollen 08/18/2018  . Abnormal mammogram of right breast 02/27/2018  . Dermatitis 05/02/2017  . Type 2 diabetes mellitus without complications ([place]) 33/35/4562  . Idiopathic subglottic tracheal stenosis 06/21/2016  . Encounter for general adult medical examination with abnormal findings 04/29/2016  . Low back pain without sciatica 04/28/2015  . Class 3 severe obesity without serious comorbidity with body mass index (BMI) of 40.0 to 44.9 in adult ([place]) 04/28/2015  . Pre-syncope 07/14/2014    Everlean Alstrom. Graylon Good, PT, DPT 06/11/20, 6:42 PM  [facility] [address] [place], [address] Phone: [phone]   Fax:  [phone]  Name: Lisa [name] MRN: [id number] Date of Birth: 10-03-1975

## 2020-06-16 ENCOUNTER — Other Ambulatory Visit: Payer: Self-pay

## 2020-06-16 ENCOUNTER — Encounter: Payer: Self-pay | Admitting: Physical Therapy

## 2020-06-16 ENCOUNTER — Ambulatory Visit: Payer: No Typology Code available for payment source | Attending: Anesthesiology | Admitting: Physical Therapy

## 2020-06-16 DIAGNOSIS — M546 Pain in thoracic spine: Secondary | ICD-10-CM | POA: Diagnosis present

## 2020-06-16 DIAGNOSIS — R519 Headache, unspecified: Secondary | ICD-10-CM | POA: Diagnosis present

## 2020-06-16 DIAGNOSIS — M5412 Radiculopathy, cervical region: Secondary | ICD-10-CM | POA: Insufficient documentation

## 2020-06-16 DIAGNOSIS — M542 Cervicalgia: Secondary | ICD-10-CM | POA: Diagnosis present

## 2020-06-16 NOTE — Therapy (Signed)
[facility] [address] [place], [address] Phone: [phone]   Fax:  [phone]  Physical Therapy Treatment / Progress Note Reporting period: 04/29/2020 - 06/16/2020  Patient Details  Name: Lisa [name] MRN: [id number] Date of Birth: 11-12-1975 Referring Provider (PT): Molli Barrows, MD (pain clinic)   Encounter Date: 06/16/2020   PT End of Session - 06/16/20 1714    Visit Number 20    Number of Visits 24    Date for PT Re-Evaluation 08/20/20    Authorization Type [place] [employer] reporting period from 04/29/2020    Progress Note Due on Visit 20    PT Start Time 1700    PT Stop Time 1745    PT Time Calculation (min) 45 min    Activity Tolerance Patient tolerated treatment well    Behavior During Therapy [facility] for tasks assessed/performed           Past Medical History:  Diagnosis Date  . Bronchitis    hx of  . Cervical radiculitis 03/03/2020  . Complication of anesthesia   . DDD (degenerative disc disease), cervical 02/28/2020  . Dysrhythmia    "does not see cardiologist"  . Family history of anesthesia complication    "morphine night terrors"  . GERD (gastroesophageal reflux disease)   . Headache(784.0)    "migraines"  . Hypertension   . PONV (postoperative nausea and vomiting)   . Shortness of breath    "wheezing"  . Weakness of left arm 03/03/2020    Past Surgical History:  Procedure Laterality Date  . CESAREAN SECTION     x 2  . MICROLARYNGOSCOPY WITH DILATION N/A 03/13/2020   Procedure: MICROLARYNGOSCOPY WITH DILATION w/JET VENT MITOMYCIN C;  Surgeon: Melida Quitter, MD;  Location: [place];  Service: ENT;  Laterality: N/A;  . MICROLARYNGOSCOPY WITH LASER AND BALLOON DILATION N/A 07/20/2012   Procedure: MICROLARYNGOSCOPY WITH LASER AND BALLOON DILATION;  Surgeon: Melida Quitter, MD;  Location: [place];  Service: ENT;  Laterality: N/A;  WITH JET VENTURI VENTILATION  . TONGUE FLAP  RELEASE    . widom extractions      There were no vitals filed for this visit.   Subjective Assessment - 06/16/20 1706    Subjective Patient reports she has been feeling really good over the weekend so she did not take her zanaflex and gabapentin last night. She did not sleep well last night and has had a headache over the top of her head today rated 2/10. No pain anywhere else currently. Felt really good at last treatment session. Reaching behind her has gotten better, just a bit of apprehension.    Pertinent History Patient is a 45 y.o. female who presents to outpatient physical therapy with a referral for medical diagnosis cervicalgia, cervical degenerative disc disease. This patient's chief complaints consist of increased neck pain and new left UE pain/weakness/paresthesia and left sided rib pain on top of chronic left neck pain radiating to the left face and headache, leading to the following functional deficits: difficulty using left UE for manual tasks, using the phone, work tasks, holding pressure on wounds (unable), holding things without dropping them, opening packages, pulling back a stopper in a syringe, holding a cup, taking lids off, grooming, folding laundry, making the bed, anything that requires two hands and force, gardening, yardwork, raking, clipping, cutting, dressing, showering, sleeping. Relevant past medical history and comorbidities include diabetes (currently controlled), idiopathic subglottic tracheal stenosis (surgery coming up  end of month, causes shortness of breath upon exertion), low back pain on left side, obesity, GERD, when she takes caffeine she gest palpitations, high blood pressure.  Patient denies hx of cancer, stroke, seizures, lung problem (besides trachea problem), major cardiac events, unexplained weight loss, changes in bowel or bladder problems, new onset stumbling or dropping things apart from described below.    Limitations House hold activities;Other  (comment);Lifting   usinghands and force, gardening, yardwork, raking, clipping, cutting, dressing, showering, sleeping.   Diagnostic tests C-spine MRI report 02/20/2020: "IMPRESSION:1. Minimal disc osteophyte complex with left uncovertebral spurringat C5-C6 resulting in mild left foraminal stenosis.2. No canal stenosis at any level."    Patient Stated Goals "to not have arm pain"    Currently in Pain? Yes    Pain Score 2     Effect of Pain on Daily Activities Reports all of the difficulties listed previously are better but her left hand gets tired and she tries to switch to the other hand occasionally, but not as much as before. No longer difficult to put her hand behind her back. Recently carried bags of groceries and held pressure on a groin recently without difficulty. Does notice apprehension about doing it some.            OBJECTIVE  FOTO = 76 (06/16/2020)  SPINE MOTION Cervical Spine AROM *Indicates pain Flexion: =48 Extension: =62. Rotation: R=60, L =58  Side Flexion: R=45,L =35  Protrusion = WNL Retraction =ear behind acromion, popping noted.  MUSCLE PERFORMANCE (MMT):  *Indicates pain 03/05/20 04/27/20 05/28/20 06/16/20  Joint/Motion R/L R/L R/L R/L  Shoulder      Flexion 4+/4 4+/4+ 5/5 5/5  Abduction 5/4+ 5/4+ 5/5 5/5  External rotation 4/4+ 4+/4+ 4+/5 5/5  Internal rotation 4+/4+ 4+/4+ 5/5 5/5  Extension / / / /  Elbow      Flexion 4+/4+ 5/4+ 5/5 5/5  Extension 4+/4+ 4+/4+ 5/5 5/5  Wrist      Flexion 4+/4+ 4+/4* 4+/4+ 4+/4+  Extension 5/3 5/4+ 5/4 4+/4+  Grip strength (in pounds, average of three measures).   R: (85+85+80)/3 =  83.3   L: (82+75+81)/ 3 =   79.3   NEURODYNAMIC TESTS ULNT positive for concordant paresthesia and neural tension left compared to right in medial > radial nerve distribution.    TREATMENT: DOES have latex allergy  Therapeutic exercise:to centralize symptoms and improve ROM, strength, muscular endurance,  and activity tolerance required for successful completion of functional activities. - seated cervical retraction with self overpressure x20 - measurements to assess progress (see above).  - variations of L active ROM median and radial nerve glides for LE UE.  - Education on HEP including handout   Manual therapy:to reduce pain and tissue tension, improve range of motion, neuromodulation, in order to promote improved ability to complete functional activities.  Prone position:  - CPA grade III-VIwith KE wedgealong thoracic spine and to mid cervical spine. Cavitations that felt good in mid to lower thoracic spine. - STM to posterior cervical spine musculature, focusing on paraspinals and left UT.   Supine position:  - left UE nerve glides moving in and out of tension isolating movement at wrist, elbow, shoulder and neck x10 reps each. For radial and median nerve. Median nerveno longerincreased pain with neck flexion towards more than away with least tension in neutral.  HOME EXERCISE PROGRAM Access Code: [email or web address] URL: [email or web address] Date: 06/16/2020 Prepared by: Rosita Kea  Exercises Cervical Retraction with Overpressure - 3 x daily -  2 sets - 10 reps Median Nerve Tensioner - 1 x daily - 1 sets - 10 reps  [email or web address]  [id number] Home Exercise Program [[email or web address]]  Radial Nerve Stretch -  Repeat 10 Times, Complete 1 Set, Perform 1 Times a Day  Median Nerve Stretch -  Repeat 10 Times, Complete 1 Set, Perform 1 Times a Day   PT Education - 06/16/20 1714    Education Details Exercise purpose/form. Self management techniques    Person(s) Educated Patient    Methods Explanation;Demonstration;Tactile cues;Verbal cues;Handout    Comprehension Returned demonstration;Verbal cues required;Tactile cues required;Need further instruction;Verbalized understanding                 PT Long Term Goals - 05/28/20 1842      PT LONG TERM GOAL #1   Title Be  independent with a long-term home exercise program for self-management of symptoms.    Baseline initial HEP provided at IE (03/04/2020); currently participating in appropriate HEP (04/27/2020); continues to participate in appropriate HEP (05/28/2020);    Time 12    Period Weeks    Status Partially Met   TARGET DATE FOR ALL LONG TERM GOALS: 05/27/2020. TARGET DATE FOR UNMET GOALS UPDATED TO 08/20/2020     PT LONG TERM GOAL #2   Title Demonstrate improved FOTO score by 10 units to demonstrate improvement in overall condition and self-reported functional ability.    Baseline deferred to visit 2 (03/04/2020); 54 at visit 2 (03/10/2020); 69 at visit 10 (04/27/2020); 61 at visit 17 (05/28/2020);    Time 12    Period Weeks    Status Partially Met      PT LONG TERM GOAL #3   Title Improve left UE  strength to equal or greater than  4+/5 for improved ability to allow patient to complete valued functional tasks such as lifting, pressing, grasping, holding, etc,  with less difficulty.    Baseline wrist extension 3/5 and reports weakness with repetition (03/05/2020); nearly met - lacks L wrist flexion (04/27/2020); met except left wrist extension (05/28/2020);    Time 12    Period Weeks    Status Partially Met      PT LONG TERM GOAL #4   Title Patient will report no longer having pain down the left arm to improve ability to use left UE for functional tasks such as grasping, pressing, stabilizing.    Baseline 8/10 (03/05/2020); 4/10 highest in the last two weeks (04/27/2020); 3/10 highest in the last two weeks (05/28/2020);    Time 12    Period Weeks    Status Partially Met      PT LONG TERM GOAL #5   Title Complete community, work and/or recreational activities without limitation due to current condition.    Baseline Functional Limitations: using left UE for manual tasks, using the phone, work tasks, holding pressure on wounds (unable), holding things without dropping them, opening packages, pulling back a  stopper in a syringe, holding a cup, taking lids off, grooming, folding laundry, making the bed, anything that requires two hands and force, gardening, yardwork, raking, clipping, cutting, dressing, showering, sleeping (03/04/2020); improved with all task but still gets tired early in the left UE and switches hands if able - also still cannot button bra and has difficulty putting on a jacket (04/27/2020); continues to improve but still has difficulty with jacket, holding pots/pans (05/28/2020);    Time 12    Period Weeks    Status Partially Met  Plan - 06/16/20 1836    Clinical Impression Statement Patient has attended 20 physical therapy sessions this episode of care. Patient has gradually progressed and has met or nearly met all goals. She has struggled with some lability of her symptoms at times and has not yet achieved reliable improvement. She continues to have neural tension in the median > radial nerve and intermittent pain in and quick fatigue in the left UE. Patient would benefit from continued PT until her symptoms stabilize and plan to decrease visit frequency as tolerated.  Patient would benefit from continued management of limiting condition by skilled physical therapist to address remaining impairments and functional limitations to work towards stated goals and return to PLOF or maximal functional independence.    Personal Factors and Comorbidities Age;Comorbidity 3+;Past/Current Experience;Fitness;Time since onset of injury/illness/exacerbation;Profession;Education    Comorbidities Relevant past medical history and comorbidities include diabetes (currently controlled), idiopathic subglottic tracheal stenosis (surgery coming up end of month, causes shortness of breath upon exertion), low back pain on left side, obesity, GERD, when she takes caffeine she gest palpitations, high blood pressure.    Examination-Activity Limitations Bathing;Hygiene/Grooming;Lift;Caring for  Others;Reach Overhead;Carry;Dressing;Sleep    Examination-Participation Restrictions Community Activity;Occupation;Yard Work;Meal Prep;Laundry;Cleaning;Interpersonal Relationship;Other   using left UE for manual tasks, using the phone, holding pressure on wounds (unable), holding things without dropping them, opening packages, pulling back a stopper in a syringe, holding a cup, taking lids off, making the bed   Stability/Clinical Decision Making Evolving/Moderate complexity    Rehab Potential Good    PT Frequency 2x / week    PT Duration 12 weeks    PT Treatment/Interventions Moist Heat;Therapeutic exercise;Manual techniques;Dry needling;Electrical Stimulation;ADLs/Self Care Home Management;Cryotherapy;Traction;Therapeutic activities;Neuromuscular re-education;Patient/family education;Passive range of motion;Taping;Joint Manipulations;Spinal Manipulations    PT Next Visit Plan manual therapy, exercise as tolerated    PT Home Exercise Plan thoracic extension, cervical spine retraction extension rotation, median nerve glide    Consulted and Agree with Plan of Care Patient           Patient will benefit from skilled therapeutic intervention in order to improve the following deficits and impairments:  Pain,Decreased activity tolerance,Impaired sensation,Improper body mechanics,Postural dysfunction,Increased muscle spasms,Decreased coordination,Impaired tone,Decreased endurance,Decreased range of motion,Decreased strength,Impaired perceived functional ability,Obesity,Impaired UE functional use  Visit Diagnosis: Radiculopathy, cervical region  Cervicalgia  Nonintractable headache, unspecified chronicity pattern, unspecified headache type  Pain in thoracic spine     Problem List Patient Active Problem List   Diagnosis Date Noted  . Cervical radiculitis 03/03/2020  . Weakness of left arm 03/03/2020  . Cervicalgia 02/28/2020  . Spondylosis, cervical, 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  . Acute URI  12/04/2019  . Acute maxillary sinusitis 12/04/2019  . Loud snoring 08/01/2019  . B12 deficiency 08/01/2019  . Eczema of left external ear 08/01/2019  . Hyperlipidemia associated with type 2 diabetes mellitus ([place]) 07/31/2019  . Nonallopathic lesion of cervical region 04/29/2019  . Nonallopathic lesion of thoracic region 04/29/2019  . Nonallopathic lesion of rib cage 04/29/2019  . Cervical radiculopathy at C7 03/26/2019  . Abnormal breast exam 01/24/2019  . Elevated ALT measurement 01/24/2019  . Allergic rhinitis due to pollen 08/18/2018  . Abnormal mammogram of right breast 02/27/2018  . Dermatitis 05/02/2017  . Type 2 diabetes mellitus without complications ([place]) 09/60/4540  . Idiopathic subglottic tracheal stenosis 06/21/2016  . Encounter for general adult medical examination with abnormal findings 04/29/2016  . Low back pain without sciatica 04/28/2015  . Class 3 severe obesity without serious comorbidity with body mass index (  BMI) of 40.0 to 44.9 in adult [facility]) 04/28/2015  . Pre-syncope 07/14/2014    Everlean Alstrom. Graylon Good, PT, DPT 06/16/20, 6:38 PM  [facility] [address] [place], [address] Phone: [phone]   Fax:  [phone]  Name: Lisa [name] MRN: [id number] Date of Birth: [birth year]

## 2020-06-18 ENCOUNTER — Ambulatory Visit: Payer: No Typology Code available for payment source | Admitting: Physical Therapy

## 2020-06-18 ENCOUNTER — Encounter: Payer: Self-pay | Admitting: Physical Therapy

## 2020-06-18 ENCOUNTER — Other Ambulatory Visit: Payer: Self-pay

## 2020-06-18 DIAGNOSIS — M5412 Radiculopathy, cervical region: Secondary | ICD-10-CM

## 2020-06-18 DIAGNOSIS — M542 Cervicalgia: Secondary | ICD-10-CM

## 2020-06-18 DIAGNOSIS — M546 Pain in thoracic spine: Secondary | ICD-10-CM

## 2020-06-18 DIAGNOSIS — R519 Headache, unspecified: Secondary | ICD-10-CM

## 2020-06-18 NOTE — Therapy (Signed)
[facility] [address] [place], [address] Phone: [phone]   Fax:  [phone]  Physical Therapy Treatment  Patient Details  Name: Lisa [name] MRN: [id number] Date of Birth: 1975/06/30 Referring Provider (PT): Molli Barrows, MD (pain clinic)   Encounter Date: 06/18/2020   PT End of Session - 06/18/20 1854    Visit Number 21    Number of Visits 24    Date for PT Re-Evaluation 08/20/20    Authorization Type [place] [employer] reporting period from 06/18/2020    Progress Note Due on Visit 30    PT Start Time 1750    PT Stop Time 1830    PT Time Calculation (min) 40 min    Activity Tolerance Patient tolerated treatment well    Behavior During Therapy [facility] for tasks assessed/performed           Past Medical History:  Diagnosis Date  . Bronchitis    hx of  . Cervical radiculitis 03/03/2020  . Complication of anesthesia   . DDD (degenerative disc disease), cervical 02/28/2020  . Dysrhythmia    "does not see cardiologist"  . Family history of anesthesia complication    "morphine night terrors"  . GERD (gastroesophageal reflux disease)   . Headache(784.0)    "migraines"  . Hypertension   . PONV (postoperative nausea and vomiting)   . Shortness of breath    "wheezing"  . Weakness of left arm 03/03/2020    Past Surgical History:  Procedure Laterality Date  . CESAREAN SECTION     x 2  . MICROLARYNGOSCOPY WITH DILATION N/A 03/13/2020   Procedure: MICROLARYNGOSCOPY WITH DILATION w/JET VENT MITOMYCIN C;  Surgeon: Melida Quitter, MD;  Location: [place];  Service: ENT;  Laterality: N/A;  . MICROLARYNGOSCOPY WITH LASER AND BALLOON DILATION N/A 07/20/2012   Procedure: MICROLARYNGOSCOPY WITH LASER AND BALLOON DILATION;  Surgeon: Melida Quitter, MD;  Location: [place];  Service: ENT;  Laterality: N/A;  WITH JET VENTURI VENTILATION  . TONGUE FLAP RELEASE    . widom extractions      There were no vitals  filed for this visit.   Subjective Assessment - 06/18/20 1752    Subjective Patient reports she has a bit of a headache at the base of her scull upon arrival, 2/10. Arm has been feeling good without tingling and she grabbed a full O2 tank today with the left UE without thinking about it, then realized it was fine. She has continued to do her neck exercises but did not try any of the new exercises.    Pertinent History Patient is a 45 y.o. female who presents to outpatient physical therapy with a referral for medical diagnosis cervicalgia, cervical degenerative disc disease. This patient's chief complaints consist of increased neck pain and new left UE pain/weakness/paresthesia and left sided rib pain on top of chronic left neck pain radiating to the left face and headache, leading to the following functional deficits: difficulty using left UE for manual tasks, using the phone, work tasks, holding pressure on wounds (unable), holding things without dropping them, opening packages, pulling back a stopper in a syringe, holding a cup, taking lids off, grooming, folding laundry, making the bed, anything that requires two hands and force, gardening, yardwork, raking, clipping, cutting, dressing, showering, sleeping. Relevant past medical history and comorbidities include diabetes (currently controlled), idiopathic subglottic tracheal stenosis (surgery coming up end of month, causes shortness of breath upon exertion), low back  pain on left side, obesity, GERD, when she takes caffeine she gest palpitations, high blood pressure.  Patient denies hx of cancer, stroke, seizures, lung problem (besides trachea problem), major cardiac events, unexplained weight loss, changes in bowel or bladder problems, new onset stumbling or dropping things apart from described below.    Limitations House hold activities;Other (comment);Lifting   usinghands and force, gardening, yardwork, raking, clipping, cutting, dressing, showering,  sleeping.   Diagnostic tests C-spine MRI report 02/20/2020: "IMPRESSION:1. Minimal disc osteophyte complex with left uncovertebral spurringat C5-C6 resulting in mild left foraminal stenosis.2. No canal stenosis at any level."    Patient Stated Goals "to not have arm pain"    Currently in Pain? Yes    Pain Score 2            TREATMENT: DOES have latex allergy  Therapeutic exercise:to centralize symptoms and improve ROM, strength, muscular endurance, and activity tolerance required for successful completion of functional activities. -seated thoracic extension over back of chair,x20 - seated cervical retraction with self overpressure x20  Manual therapy:to reduce pain and tissue tension, improve range of motion, neuromodulation, in order to promote improved ability to complete functional activities.  Prone position:  - CPA grade III-VIwith and without KE wedgealong thoracic spine and to mid cervical spine. Cavitations that felt good in mid to lower thoracic spine. - STM to posterior cervical spine musculature, focusingon paraspinals and left UT.   Supine position:  - left UE nerve glides moving in and out of tension isolating movement at wrist, elbow, shoulder and neck x10 reps each. For radial and median nerve. Median nerveno longerincreased pain with neck flexion towards more than away with least tension in neutral. - STM to suboccipital region, cervical paraspinals.   HOME EXERCISE PROGRAM Access Code: [email or web address] URL: [email or web address] Date: 06/16/2020 Prepared by: Rosita Kea  Exercises Cervical Retraction with Overpressure - 3 x daily - 2 sets - 10 reps Median Nerve Tensioner - 1 x daily - 1 sets - 10 reps  [email or web address]  [id number] Home Exercise Program [9SVDHEY]  Radial Nerve Stretch -  Repeat 10 Times, Complete 1 Set, Perform 1 Times a Day  Median Nerve Stretch -  Repeat 10 Times, Complete 1 Set, Perform 1 Times a Day    PT  Education - 06/18/20 1854    Education Details Exercise purpose/form. Self management techniques    Person(s) Educated Patient    Methods Explanation;Demonstration;Tactile cues;Verbal cues    Comprehension Returned demonstration;Verbalized understanding;Verbal cues required;Tactile cues required;Need further instruction               PT Long Term Goals - 05/28/20 1842      PT LONG TERM GOAL #1   Title Be independent with a long-term home exercise program for self-management of symptoms.    Baseline initial HEP provided at IE (03/04/2020); currently participating in appropriate HEP (04/27/2020); continues to participate in appropriate HEP (05/28/2020);    Time 12    Period Weeks    Status Partially Met   TARGET DATE FOR ALL LONG TERM GOALS: 05/27/2020. TARGET DATE FOR UNMET GOALS UPDATED TO 08/20/2020     PT LONG TERM GOAL #2   Title Demonstrate improved FOTO score by 10 units to demonstrate improvement in overall condition and self-reported functional ability.    Baseline deferred to visit 2 (03/04/2020); 54 at visit 2 (03/10/2020); 69 at visit 10 (04/27/2020); 61 at visit 17 (05/28/2020);    Time 12    Period Weeks    Status Partially  Met      PT LONG TERM GOAL #3   Title Improve left UE  strength to equal or greater than  4+/5 for improved ability to allow patient to complete valued functional tasks such as lifting, pressing, grasping, holding, etc,  with less difficulty.    Baseline wrist extension 3/5 and reports weakness with repetition (03/05/2020); nearly met - lacks L wrist flexion (04/27/2020); met except left wrist extension (05/28/2020);    Time 12    Period Weeks    Status Partially Met      PT LONG TERM GOAL #4   Title Patient will report no longer having pain down the left arm to improve ability to use left UE for functional tasks such as grasping, pressing, stabilizing.    Baseline 8/10 (03/05/2020); 4/10 highest in the last two weeks (04/27/2020); 3/10 highest in the  last two weeks (05/28/2020);    Time 12    Period Weeks    Status Partially Met      PT LONG TERM GOAL #5   Title Complete community, work and/or recreational activities without limitation due to current condition.    Baseline Functional Limitations: using left UE for manual tasks, using the phone, work tasks, holding pressure on wounds (unable), holding things without dropping them, opening packages, pulling back a stopper in a syringe, holding a cup, taking lids off, grooming, folding laundry, making the bed, anything that requires two hands and force, gardening, yardwork, raking, clipping, cutting, dressing, showering, sleeping (03/04/2020); improved with all task but still gets tired early in the left UE and switches hands if able - also still cannot button bra and has difficulty putting on a jacket (04/27/2020); continues to improve but still has difficulty with jacket, holding pots/pans (05/28/2020);    Time 12    Period Weeks    Status Partially Met                 Plan - 06/18/20 1857    Clinical Impression Statement Patient tolerated treatment well overall and reported slight reduction in headache. Continues to show more stability in improvement and plan to drop to 1x a week visit frequency. Patient would benefit from continued management of limiting condition by skilled physical therapist to address remaining impairments and functional limitations to work towards stated goals and return to PLOF or maximal functional independence.    Personal Factors and Comorbidities Age;Comorbidity 3+;Past/Current Experience;Fitness;Time since onset of injury/illness/exacerbation;Profession;Education    Comorbidities Relevant past medical history and comorbidities include diabetes (currently controlled), idiopathic subglottic tracheal stenosis (surgery coming up end of month, causes shortness of breath upon exertion), low back pain on left side, obesity, GERD, when she takes caffeine she gest  palpitations, high blood pressure.    Examination-Activity Limitations Bathing;Hygiene/Grooming;Lift;Caring for Others;Reach Overhead;Carry;Dressing;Sleep    Examination-Participation Restrictions Community Activity;Occupation;Yard Work;Meal Prep;Laundry;Cleaning;Interpersonal Relationship;Other   using left UE for manual tasks, using the phone, holding pressure on wounds (unable), holding things without dropping them, opening packages, pulling back a stopper in a syringe, holding a cup, taking lids off, making the bed   Stability/Clinical Decision Making Evolving/Moderate complexity    Rehab Potential Good    PT Frequency 2x / week    PT Duration 12 weeks    PT Treatment/Interventions Moist Heat;Therapeutic exercise;Manual techniques;Dry needling;Electrical Stimulation;ADLs/Self Care Home Management;Cryotherapy;Traction;Therapeutic activities;Neuromuscular re-education;Patient/family education;Passive range of motion;Taping;Joint Manipulations;Spinal Manipulations    PT Next Visit Plan manual therapy, exercise as tolerated, transition to independent care as stability in improvment is demonstrated  PT Home Exercise Plan medbridge Access Code: [email or web address] ; [email or web address]   [id number] Home Exercise Program (9SVDHEY)    Consulted and Agree with Plan of Care Patient           Patient will benefit from skilled therapeutic intervention in order to improve the following deficits and impairments:  Pain,Decreased activity tolerance,Impaired sensation,Improper body mechanics,Postural dysfunction,Increased muscle spasms,Decreased coordination,Impaired tone,Decreased endurance,Decreased range of motion,Decreased strength,Impaired perceived functional ability,Obesity,Impaired UE functional use  Visit Diagnosis: Radiculopathy, cervical region  Cervicalgia  Nonintractable headache, unspecified chronicity pattern, unspecified headache type  Pain in thoracic spine     Problem List Patient Active Problem  List   Diagnosis Date Noted  . Cervical radiculitis 03/03/2020  . Weakness of left arm 03/03/2020  . Cervicalgia 02/28/2020  . Spondylosis, cervical, 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  . Acute URI 12/04/2019  . Acute maxillary sinusitis 12/04/2019  . Loud snoring 08/01/2019  . B12 deficiency 08/01/2019  . Eczema of left external ear 08/01/2019  . Hyperlipidemia associated with type 2 diabetes mellitus ([place]) 07/31/2019  . Nonallopathic lesion of cervical region 04/29/2019  . Nonallopathic lesion of thoracic region 04/29/2019  . Nonallopathic lesion of rib cage 04/29/2019  . Cervical radiculopathy at C7 03/26/2019  . Abnormal breast exam 01/24/2019  . Elevated ALT measurement 01/24/2019  . Allergic rhinitis due to pollen 08/18/2018  . Abnormal mammogram of right breast 02/27/2018  . Dermatitis 05/02/2017  . Type 2 diabetes mellitus without complications ([place]) 40/81/4481  . Idiopathic subglottic tracheal stenosis 06/21/2016  . Encounter for general adult medical examination with abnormal findings 04/29/2016  . Low back pain without sciatica 04/28/2015  . Class 3 severe obesity without serious comorbidity with body mass index (BMI) of 40.0 to 44.9 in adult ([place]) 04/28/2015  . Pre-syncope 07/14/2014    Everlean Alstrom. Graylon Good, PT, DPT 06/18/20, 6:58 PM  [place] [facility] [address] [place], [address] Phone: [phone]   Fax:  [phone]  Name: Lisa [name] MRN: [id number] Date of Birth: 08-29-1975

## 2020-06-19 ENCOUNTER — Encounter: Payer: Self-pay | Admitting: Internal Medicine

## 2020-06-19 ENCOUNTER — Ambulatory Visit (INDEPENDENT_AMBULATORY_CARE_PROVIDER_SITE_OTHER): Payer: No Typology Code available for payment source | Admitting: Internal Medicine

## 2020-06-19 ENCOUNTER — Other Ambulatory Visit: Payer: Self-pay | Admitting: Internal Medicine

## 2020-06-19 VITALS — BP 136/98 | HR 110 | Temp 97.7°F | Ht 62.01 in | Wt 291.8 lb

## 2020-06-19 DIAGNOSIS — R635 Abnormal weight gain: Secondary | ICD-10-CM

## 2020-06-19 DIAGNOSIS — J386 Stenosis of larynx: Secondary | ICD-10-CM

## 2020-06-19 DIAGNOSIS — E782 Mixed hyperlipidemia: Secondary | ICD-10-CM | POA: Diagnosis not present

## 2020-06-19 DIAGNOSIS — E538 Deficiency of other specified B group vitamins: Secondary | ICD-10-CM | POA: Diagnosis not present

## 2020-06-19 DIAGNOSIS — R7401 Elevation of levels of liver transaminase levels: Secondary | ICD-10-CM | POA: Diagnosis not present

## 2020-06-19 DIAGNOSIS — E559 Vitamin D deficiency, unspecified: Secondary | ICD-10-CM

## 2020-06-19 DIAGNOSIS — Z6841 Body Mass Index (BMI) 40.0 and over, adult: Secondary | ICD-10-CM

## 2020-06-19 DIAGNOSIS — E119 Type 2 diabetes mellitus without complications: Secondary | ICD-10-CM

## 2020-06-19 DIAGNOSIS — Z0001 Encounter for general adult medical examination with abnormal findings: Secondary | ICD-10-CM

## 2020-06-19 DIAGNOSIS — R030 Elevated blood-pressure reading, without diagnosis of hypertension: Secondary | ICD-10-CM

## 2020-06-19 DIAGNOSIS — E1169 Type 2 diabetes mellitus with other specified complication: Secondary | ICD-10-CM

## 2020-06-19 DIAGNOSIS — E785 Hyperlipidemia, unspecified: Secondary | ICD-10-CM

## 2020-06-19 DIAGNOSIS — R7301 Impaired fasting glucose: Secondary | ICD-10-CM

## 2020-06-19 MED ORDER — TIZANIDINE HCL 4 MG PO CAPS: 4.0000 mg | ORAL_CAPSULE | Freq: Every evening | ORAL | 2 refills | Status: DC | PRN

## 2020-06-19 NOTE — Progress Notes (Signed)
Patient ID: Lisa [name], female    DOB: 10/20/1975  Age: 45 y.o. MRN: [id number]  The patient is here for annual non GYN preventive examination and management of other chronic and acute problems.  This visit occurred during the SARS-CoV-2 public health emergency.  Safety protocols were in place, including screening questions prior to the visit, additional usage of staff PPE, and extensive cleaning of exam room while observing appropriate contact time as indicated for disinfecting solutions.   Health Maintenance:   PAP normal 2020 Mammogram Sept 2021    The risk factors are reviewed and reflected in the social history.  The roster of all physicians providing medical care to patient - is listed in the Snapshot section of the chart.  Activities of daily living:  The patient is 100% independent in all ADLs: dressing, toileting, feeding as well as independent mobility  Home safety : The patient has smoke detectors in the home. They wear seatbelts.  There are no firearms at home. There is no violence in the home.   There is no risks for hepatitis, STDs or HIV. There is no   history of blood transfusion. They have no travel history to infectious disease endemic areas of the world.  The patient has seen their dentist in the last six month. They have seen their eye doctor in the last year. They admit to slight hearing difficulty with regard to whispered voices and some television programs.  They have deferred audiologic testing in the last year.  They do not  have excessive sun exposure. Discussed the need for sun protection: hats, long sleeves and use of sunscreen if there is significant sun exposure.   Diet: the importance of a healthy diet is discussed. They do have a healthy diet.  The benefits of regular aerobic exercise were discussed. She is not exercising  regularly.   Depression screen: there are no signs or vegative symptoms of depression- irritability, change in appetite,  anhedonia, sadness/tearfullness.    The following portions of the patient's history were reviewed and updated as appropriate: allergies, current medications, past family history, past medical history,  past surgical history, past social history  and problem list.  Visual acuity was not assessed per patient preference since she has regular follow up with her ophthalmologist. Hearing and body mass index were assessed and reviewed.   During the course of the visit the patient was educated and counseled about appropriate screening and preventive services including : fall prevention , diabetes screening, nutrition counseling, colorectal cancer screening, and recommended immunizations.    CC: The primary encounter diagnosis was B12 deficiency. Diagnoses of Subglottic stenosis, Elevated ALT measurement, Moderate mixed hyperlipidemia not requiring statin therapy, Impaired fasting glucose, Hyperlipidemia associated with type 2 diabetes mellitus (HCC), Class 3 severe obesity without serious comorbidity with body mass index (BMI) of 40.0 to 44.9 in adult, unspecified obesity type (HCC), Elevated blood pressure reading in office without diagnosis of hypertension, Vitamin D deficiency, Weight gain, Encounter for general adult medical examination with abnormal findings, and Type 2 diabetes mellitus without complication, without long-term current use of insulin (HCC) were also pertinent to this visit.  Underwent laryngeal dilation in October 2021  For subglottic stenosis .  Still having some wheezing brought on by PND during URI's, also feels symptoms are aggravated by wearing masks .   Getting PT for neck pain 2/week since October,  Had a flareup of left C5-6  radiculitis,  MRi done and ESI recommended vs PT .  She  chose PT; symptoms have been steadily iImproving.  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 Admits that her anxiety is due to procrastinating things,  Then lying awake "beating herself up about them."  Hates to get out of bed some days  Obesity:  Feels an imperative to lose weight but has not made any decisions on how to approach the problem.  Reviewed diet and lifestyle in detail today   History Lisa [name] has a past medical history of Bronchitis, Cervical radiculitis (03/03/2020), Complication of anesthesia, DDD (degenerative disc disease), cervical (02/28/2020), Dysrhythmia, Family history of anesthesia complication, GERD (gastroesophageal reflux disease), Headache(784.0), Hypertension, PONV (postoperative nausea and vomiting), Shortness of breath, and Weakness of left arm (03/03/2020).   She has a past surgical history that includes Cesarean section; widom extractions; Tongue flap release; Microlaryngoscopy with laser and balloon dilation (N/A, 07/20/2012); and Microlaryngoscopy with dilation (N/A, 03/13/2020).   Her family history includes Arthritis in her mother; Hyperlipidemia in her father and mother; Hypertension in her father and mother.She reports that she has never smoked. She has never used smokeless tobacco. She reports current alcohol use. She reports that she does not use drugs.  Outpatient Medications Prior to Visit  Medication Sig Dispense Refill  . acetaminophen (TYLENOL) 500 MG tablet Take 1,000 mg by mouth every 6 (six) hours as needed.    . cetirizine (ZYRTEC) 10 MG tablet Take 10 mg by mouth 2 (two) times daily.     . cholecalciferol (VITAMIN D3) 25 MCG (1000 UNIT) tablet Take 1,000 Units by mouth daily.    . cyclobenzaprine (FLEXERIL) 5 MG tablet Take 1 tablet (5 mg total) by mouth 3 (three) times daily as needed. (Patient taking differently: Take 5 mg by mouth 3 (three) times daily as needed for muscle spasms.) 30 tablet 0  . DENAVIR 1 % cream APPLY TOPICALLY EVERY TWO HOURS FOR 4 DAYS AS NEEDED FOR COLD SORES (Patient taking differently: Apply 1 application  topically See admin instructions. Apply topically every 2 hours for 4 days as needed for cold sores.) 5 g 0  . etonogestrel (NEXPLANON) 68 MG IMPL implant 1 each by Subdermal route once.    . fluticasone (FLONASE) 50 MCG/ACT nasal spray Place 2 sprays into both nostrils daily. 48 g 1  . gabapentin (NEURONTIN) 100 MG capsule Take 2 capsules (200 mg total) by mouth at bedtime. 180 capsule 3  . gabapentin (NEURONTIN) 300 MG capsule Take 1 capsule (300 mg total) by mouth at bedtime. 90 capsule 3  . Misc Natural Products (TART CHERRY ADVANCED PO) Take 1 capsule by mouth daily.    Marland Kitchen omeprazole (PRILOSEC) 40 MG capsule TAKE 1 CAPSULE BY MOUTH DAILY. 90 capsule 1  . tiZANidine (ZANAFLEX) 4 MG tablet Take 1 tablet (4 mg total) by mouth at bedtime. 30 tablet 0  . Turmeric 400 MG CAPS Take 400 mg by mouth daily.     Marland Kitchen tiZANidine (ZANAFLEX) 4 MG capsule TAKE 1 CAPSULE BY MOUTH THREE TIMES DAILY (Patient taking differently: Take 4 mg by mouth at bedtime as needed for muscle spasms.) 90 capsule 0   No facility-administered medications prior to visit.    Review of Systems   Patient denies headache, fevers, malaise, unintentional weight loss, skin rash, eye pain, sinus congestion and sinus pain, sore throat, dysphagia,  hemoptysis , cough, dyspnea, wheezing, chest pain, palpitations, orthopnea,  edema, abdominal pain, nausea, melena, diarrhea, constipation, flank pain, dysuria, hematuria, urinary  Frequency, nocturia, numbness, tingling, seizures,  Focal weakness, Loss of consciousness,  Tremor, insomnia, depression, anxiety, and suicidal ideation.      Objective:  BP (!) 136/98 (BP Location: Left Arm, Patient Position: Sitting)   Pulse (!) 110   Temp 97.7 F (36.5 C)   Ht 5' 2.01" (1.575 m)   Wt 291 lb 12.8 oz (132.4 kg)   SpO2 97%   BMI 53.36 kg/m   Physical Exam  General appearance: alert, cooperative and appears stated age Ears: normal TM's and external ear canals both ears Throat: lips,  mucosa, and tongue normal; teeth and gums normal Neck: no adenopathy, no carotid bruit, supple, symmetrical, trachea midline and thyroid not enlarged, symmetric, no tenderness/mass/nodules Back: symmetric, no curvature. ROM normal. No CVA tenderness. Lungs: clear to auscultation bilaterally Heart: regular rate and rhythm, S1, S2 normal, no murmur, click, rub or gallop Abdomen: soft, non-tender; bowel sounds normal; no masses,  no organomegaly Pulses: 2+ and symmetric Skin: Skin color, texture, turgor normal. No rashes or lesions Lymph nodes: Cervical, supraclavicular, and axillary nodes normal.  Assessment & Plan:   Problem List Items Addressed This Visit      Unprioritized   B12 deficiency - Primary   Relevant Orders   Vitamin B12   Class 3 severe obesity without serious comorbidity with body mass index (BMI) of 40.0 to 44.9 in adult [facility])    I have addressed  BMI and recommended a low glycemic index diet utilizing smaller more frequent meals to increase metabolism.  I have also recommended that patient start exercising with a goal of 30 minutes of aerobic exercise a minimum of 5 days per week, but this has been hindered by her back pain .  Will recommend metformin/ozempic pending review of labs which are OVERDUE  Lab Results  Component Value Date   HGBA1C 6.0 07/24/2019   Lab Results  Component Value Date   TSH 3.64 07/31/2019   Lab Results  Component Value Date   CHOL 197 07/24/2019   HDL 41.90 07/24/2019   LDLCALC 117 (H) 07/24/2019   LDLDIRECT 110.0 02/27/2019   TRIG 191.0 (H) 07/24/2019   CHOLHDL 5 07/24/2019        RESOLVED: Elevated ALT measurement   Relevant Orders   Comprehensive metabolic panel   Elevated blood pressure reading in office without diagnosis of hypertension    She has no history of hypertension but has one  elevated reading today which was confirmed on repeat.   She has been asked to check her pressures at home and submit readings for evaluation.  Renal function will be checked today      Relevant Orders   Microalbumin / creatinine urine ratio   Encounter for general adult medical examination with abnormal findings    age appropriate education and counseling updated, referrals for preventative services and immunizations addressed, dietary and smoking counseling addressed, most recent labs reviewed.  I have personally reviewed and have noted:  1) the patient's medical and social history 2) The pt's use of alcohol, tobacco, and illicit drugs 3) The patient's current medications and supplements 4) Functional ability including ADL's, fall risk, home safety risk, hearing and visual impairment 5) Diet and physical activities 6) Evidence for depression or mood disorder 7) The patient's height, weight, and BMI have been recorded in the chart  I have made referrals, and provided counseling and education based on review of the above  Hyperlipidemia associated with type 2 diabetes mellitus (HCC)   Subglottic stenosis    S/p laryngoscopy with dilation  In Oct 2021 by Christia Reading       Type 2 diabetes mellitus without complications [facility])    She has been lost to follow up for one year.  Does not check BS or take medications.  Given her BMI , Will recommend trial of metformin/Ozempic pending review of labs which have been ordered and statin if LDL is > 70  Lab Results  Component Value Date   HGBA1C 6.0 07/24/2019           Other Visit Diagnoses    Moderate mixed hyperlipidemia not requiring statin therapy       Relevant Orders   Lipid panel   Impaired fasting glucose       Relevant Orders   Hemoglobin A1c   Vitamin D deficiency       Relevant Orders   VITAMIN D 25 Hydroxy (Vit-D Deficiency, Fractures)   Weight gain       Relevant Orders   TSH      I have changed Marcelino Duster K. Proia's tiZANidine. I am also having her maintain her etonogestrel, cetirizine, Denavir, cholecalciferol, Turmeric, Misc Natural Products (TART  CHERRY ADVANCED PO), cyclobenzaprine, gabapentin, gabapentin, fluticasone, acetaminophen, omeprazole, and tiZANidine.  Meds ordered this encounter  Medications  . tiZANidine (ZANAFLEX) 4 MG capsule    Sig: Take 1 capsule (4 mg total) by mouth at bedtime as needed for muscle spasms.    Dispense:  30 capsule    Refill:  2    Medications Discontinued During This Encounter  Medication Reason  . tiZANidine (ZANAFLEX) 4 MG capsule Reorder    Follow-up: No follow-ups on file.   Sherlene Shams, MD

## 2020-06-19 NOTE — Patient Instructions (Signed)
Portion Size Reduction and meal Planning  Are the keys to controlling your intake  Goal is 4 lbs per month     Start walking for 30 minutes daily.  Work up to 5 days per week    Follow up 3 months

## 2020-06-19 NOTE — Assessment & Plan Note (Signed)
S/p laryngoscopy with dilation  In Oct 2021 by Christia Reading

## 2020-06-21 DIAGNOSIS — R030 Elevated blood-pressure reading, without diagnosis of hypertension: Secondary | ICD-10-CM | POA: Insufficient documentation

## 2020-06-21 NOTE — Assessment & Plan Note (Signed)
She has no history of hypertension but has one  elevated reading today which was confirmed on repeat.   She has been asked to check her pressures at home and submit readings for evaluation. Renal function will be checked today

## 2020-06-21 NOTE — Assessment & Plan Note (Signed)
She has been lost to follow up for one year.  Does not check BS or take medications.  Given her BMI , Will recommend trial of metformin/Ozempic pending review of labs which have been ordered and statin if LDL is > 70  Lab Results  Component Value Date   HGBA1C 6.0 07/24/2019

## 2020-06-21 NOTE — Assessment & Plan Note (Signed)
I have addressed  BMI and recommended a low glycemic index diet utilizing smaller more frequent meals to increase metabolism.  I have also recommended that patient start exercising with a goal of 30 minutes of aerobic exercise a minimum of 5 days per week, but this has been hindered by her back pain .  Will recommend metformin/ozempic pending review of labs which are OVERDUE  Lab Results  Component Value Date   HGBA1C 6.0 07/24/2019   Lab Results  Component Value Date   TSH 3.64 07/31/2019   Lab Results  Component Value Date   CHOL 197 07/24/2019   HDL 41.90 07/24/2019   LDLCALC 117 (H) 07/24/2019   LDLDIRECT 110.0 02/27/2019   TRIG 191.0 (H) 07/24/2019   CHOLHDL 5 07/24/2019

## 2020-06-21 NOTE — Assessment & Plan Note (Signed)

## 2020-06-22 ENCOUNTER — Ambulatory Visit: Payer: No Typology Code available for payment source | Admitting: Physical Therapy

## 2020-06-23 ENCOUNTER — Ambulatory Visit: Payer: No Typology Code available for payment source | Admitting: Family Medicine

## 2020-06-25 ENCOUNTER — Ambulatory Visit: Payer: No Typology Code available for payment source | Admitting: Physical Therapy

## 2020-06-25 ENCOUNTER — Encounter: Payer: Self-pay | Admitting: Physical Therapy

## 2020-06-25 ENCOUNTER — Other Ambulatory Visit: Payer: Self-pay

## 2020-06-25 DIAGNOSIS — M546 Pain in thoracic spine: Secondary | ICD-10-CM

## 2020-06-25 DIAGNOSIS — M542 Cervicalgia: Secondary | ICD-10-CM

## 2020-06-25 DIAGNOSIS — R519 Headache, unspecified: Secondary | ICD-10-CM

## 2020-06-25 DIAGNOSIS — M5412 Radiculopathy, cervical region: Secondary | ICD-10-CM

## 2020-06-25 NOTE — Therapy (Signed)
[facility] [address], [place], [address] Phone: [phone]   Fax:  [phone]  Physical Therapy Treatment  Patient Details  Name: Lisa [name] MRN: [id number] Date of Birth: 04-21-1976 Referring Provider (PT): Molli Barrows, MD (pain clinic)   Encounter Date: 06/25/2020   PT End of Session - 06/25/20 1552    Visit Number 22    Number of Visits 24    Date for PT Re-Evaluation 08/20/20    Authorization Type [employer] reporting period from 06/18/2020    Progress Note Due on Visit 30    PT Start Time 1550    PT Stop Time 1628    PT Time Calculation (min) 38 min    Activity Tolerance Patient tolerated treatment well    Behavior During Therapy [facility] for tasks assessed/performed           Past Medical History:  Diagnosis Date  . Bronchitis    hx of  . Cervical radiculitis 03/03/2020  . Complication of anesthesia   . DDD (degenerative disc disease), cervical 02/28/2020  . Dysrhythmia    "does not see cardiologist"  . Family history of anesthesia complication    "morphine night terrors"  . GERD (gastroesophageal reflux disease)   . Headache(784.0)    "migraines"  . Hypertension   . PONV (postoperative nausea and vomiting)   . Shortness of breath    "wheezing"  . Weakness of left arm 03/03/2020    Past Surgical History:  Procedure Laterality Date  . CESAREAN SECTION     x 2  . MICROLARYNGOSCOPY WITH DILATION N/A 03/13/2020   Procedure: MICROLARYNGOSCOPY WITH DILATION w/JET VENT MITOMYCIN C;  Surgeon: Melida Quitter, MD;  Location: [facility];  Service: ENT;  Laterality: N/A;  . MICROLARYNGOSCOPY WITH LASER AND BALLOON DILATION N/A 07/20/2012   Procedure: MICROLARYNGOSCOPY WITH LASER AND BALLOON DILATION;  Surgeon: Melida Quitter, MD;  Location: [facility];  Service: ENT;  Laterality: N/A;  WITH JET VENTURI VENTILATION  . TONGUE FLAP RELEASE    . widom extractions      There were no vitals  filed for this visit.   Subjective Assessment - 06/25/20 1551    Subjective Patient states she has had some headaches since last treatment session in the suboccipital region. Her arm has been doing pretty good but she did feel worried she did not have the endurance to hold pressure today. She is still coming off of her gabapentin more gradually.    Pertinent History Patient is a 45 y.o. female who presents to outpatient physical therapy with a referral for medical diagnosis cervicalgia, cervical degenerative disc disease. This patient's chief complaints consist of increased neck pain and new left UE pain/weakness/paresthesia and left sided rib pain on top of chronic left neck pain radiating to the left face and headache, leading to the following functional deficits: difficulty using left UE for manual tasks, using the phone, work tasks, holding pressure on wounds (unable), holding things without dropping them, opening packages, pulling back a stopper in a syringe, holding a cup, taking lids off, grooming, folding laundry, making the bed, anything that requires two hands and force, gardening, yardwork, raking, clipping, cutting, dressing, showering, sleeping. Relevant past medical history and comorbidities include diabetes (currently controlled), idiopathic subglottic tracheal stenosis (surgery coming up end of month, causes shortness of breath upon exertion), low back pain on left side, obesity, GERD, when she takes caffeine she gest palpitations, high blood pressure.  Patient denies hx of cancer, stroke, seizures, lung problem (besides trachea problem), major cardiac events, unexplained weight loss, changes in bowel or bladder problems, new onset stumbling or dropping things apart from described below.    Limitations House hold activities;Other (comment);Lifting   usinghands and force, gardening, yardwork, raking, clipping, cutting, dressing, showering, sleeping.   Diagnostic tests C-spine MRI report  02/20/2020: "IMPRESSION:1. Minimal disc osteophyte complex with left uncovertebral spurringat C5-C6 resulting in mild left foraminal stenosis.2. No canal stenosis at any level."    Patient Stated Goals "to not have arm pain"    Currently in Pain? No/denies           TREATMENT: DOES have latex allergy  Therapeutic exercise:to centralize symptoms and improve ROM, strength, muscular endurance, and activity tolerance required for successful completion of functional activities. - seated cervical retraction with self overpressure x20 -seated thoracic extension over back of chair,x20 - seated chest press, 3x10 at 20# - standing scapular rows, 3x10 at 20#  - seated overhead press, 3x10 with 6# DB - seated lat pull, 3x10 with 25# cable.  Manual therapy:to reduce pain and tissue tension, improve range of motion, neuromodulation, in order to promote improved ability to complete functional activities.  Supine position:  - STM to suboccipital region with intermittent manual traction to decrease headaches.   HOME EXERCISE PROGRAM Access Code: [email or web address] URL: [email or web address] Date: 06/16/2020 Prepared by: Rosita Kea  Exercises Cervical Retraction with Overpressure - 3 x daily - 2 sets - 10 reps Median Nerve Tensioner - 1 x daily - 1 sets - 10 reps  [email or web address] [id number] Home Exercise Program [[id number]]  Radial Nerve Stretch - Repeat 10 Times, Complete 1 Set, Perform 1 Times a Day  Median Nerve Stretch - Repeat 10 Times, Complete 1 Set, Perform 1 Times a Day    PT Education - 06/25/20 1552    Education Details Exercise purpose/form. Self management techniques    Person(s) Educated Patient    Methods Explanation;Demonstration;Tactile cues;Verbal cues    Comprehension Verbalized understanding;Returned demonstration;Verbal cues required;Tactile cues required;Need further instruction               PT Long Term Goals - 05/28/20 1842      PT LONG  TERM GOAL #1   Title Be independent with a long-term home exercise program for self-management of symptoms.    Baseline initial HEP provided at IE (03/04/2020); currently participating in appropriate HEP (04/27/2020); continues to participate in appropriate HEP (05/28/2020);    Time 12    Period Weeks    Status Partially Met   TARGET DATE FOR ALL LONG TERM GOALS: 05/27/2020. TARGET DATE FOR UNMET GOALS UPDATED TO 08/20/2020     PT LONG TERM GOAL #2   Title Demonstrate improved FOTO score by 10 units to demonstrate improvement in overall condition and self-reported functional ability.    Baseline deferred to visit 2 (03/04/2020); 54 at visit 2 (03/10/2020); 69 at visit 10 (04/27/2020); 61 at visit 17 (05/28/2020);    Time 12    Period Weeks    Status Partially Met      PT LONG TERM GOAL #3   Title Improve left UE  strength to equal or greater than  4+/5 for improved ability to allow patient to complete valued functional tasks such as lifting, pressing, grasping, holding, etc,  with less difficulty.    Baseline wrist extension 3/5 and reports weakness with repetition (03/05/2020); nearly met - lacks L wrist flexion (04/27/2020); met except left wrist extension (05/28/2020);  Time 12    Period Weeks    Status Partially Met      PT LONG TERM GOAL #4   Title Patient will report no longer having pain down the left arm to improve ability to use left UE for functional tasks such as grasping, pressing, stabilizing.    Baseline 8/10 (03/05/2020); 4/10 highest in the last two weeks (04/27/2020); 3/10 highest in the last two weeks (05/28/2020);    Time 12    Period Weeks    Status Partially Met      PT LONG TERM GOAL #5   Title Complete community, work and/or recreational activities without limitation due to current condition.    Baseline Functional Limitations: using left UE for manual tasks, using the phone, work tasks, holding pressure on wounds (unable), holding things without dropping them, opening  packages, pulling back a stopper in a syringe, holding a cup, taking lids off, grooming, folding laundry, making the bed, anything that requires two hands and force, gardening, yardwork, raking, clipping, cutting, dressing, showering, sleeping (03/04/2020); improved with all task but still gets tired early in the left UE and switches hands if able - also still cannot button bra and has difficulty putting on a jacket (04/27/2020); continues to improve but still has difficulty with jacket, holding pots/pans (05/28/2020);    Time 12    Period Weeks    Status Partially Met                 Plan - 06/25/20 1637    Clinical Impression Statement Patient tolerated treatment well without reproduction of symptoms. L UE felt weaker than R consistent with deconditioning due to relative rest and recovery from neurologic weakness. Included more UE and postural strengthening this session. Discussed possible DOMS and appropriate response. Patient would benefit from continued management of limiting condition by skilled physical therapist to address remaining impairments and functional limitations to work towards stated goals and return to PLOF or maximal functional independence.    Personal Factors and Comorbidities Age;Comorbidity 3+;Past/Current Experience;Fitness;Time since onset of injury/illness/exacerbation;Profession;Education    Comorbidities Relevant past medical history and comorbidities include diabetes (currently controlled), idiopathic subglottic tracheal stenosis (surgery coming up end of month, causes shortness of breath upon exertion), low back pain on left side, obesity, GERD, when she takes caffeine she gest palpitations, high blood pressure.    Examination-Activity Limitations Bathing;Hygiene/Grooming;Lift;Caring for Others;Reach Overhead;Carry;Dressing;Sleep    Examination-Participation Restrictions Community Activity;Occupation;Yard Work;Meal Prep;Laundry;Cleaning;Interpersonal  Relationship;Other   using left UE for manual tasks, using the phone, holding pressure on wounds (unable), holding things without dropping them, opening packages, pulling back a stopper in a syringe, holding a cup, taking lids off, making the bed   Stability/Clinical Decision Making Evolving/Moderate complexity    Rehab Potential Good    PT Frequency 2x / week    PT Duration 12 weeks    PT Treatment/Interventions Moist Heat;Therapeutic exercise;Manual techniques;Dry needling;Electrical Stimulation;ADLs/Self Care Home Management;Cryotherapy;Traction;Therapeutic activities;Neuromuscular re-education;Patient/family education;Passive range of motion;Taping;Joint Manipulations;Spinal Manipulations    PT Next Visit Plan manual therapy, exercise as tolerated, transition to independent care as stability in improvment is demonstrated    PT Home Exercise Plan medbridge Access Code: [email or web address] ; [email or web address]   [id number] Home Exercise Program ([id number])    Consulted and Agree with Plan of Care Patient           Patient will benefit from skilled therapeutic intervention in order to improve the following deficits and impairments:  Pain,Decreased activity tolerance,Impaired sensation,Improper body mechanics,Postural dysfunction,Increased muscle spasms,Decreased coordination,Impaired tone,Decreased  endurance,Decreased range of motion,Decreased strength,Impaired perceived functional ability,Obesity,Impaired UE functional use  Visit Diagnosis: Radiculopathy, cervical region  Cervicalgia  Nonintractable headache, unspecified chronicity pattern, unspecified headache type  Pain in thoracic spine     Problem List Patient Active Problem List   Diagnosis Date Noted  . Elevated blood pressure reading in office without diagnosis of hypertension 06/21/2020  . Cervical radiculitis 03/03/2020  . Weakness of left arm 03/03/2020  . Cervicalgia 02/28/2020  . Spondylosis, cervical, 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  . Acute  URI 12/04/2019  . Loud snoring 08/01/2019  . B12 deficiency 08/01/2019  . Eczema of left external ear 08/01/2019  . Hyperlipidemia associated with type 2 diabetes mellitus ([place]) 07/31/2019  . Nonallopathic lesion of cervical region 04/29/2019  . Nonallopathic lesion of thoracic region 04/29/2019  . Nonallopathic lesion of rib cage 04/29/2019  . Cervical radiculopathy at C7 03/26/2019  . Abnormal breast exam 01/24/2019  . Allergic rhinitis due to pollen 08/18/2018  . Abnormal mammogram of right breast 02/27/2018  . Type 2 diabetes mellitus without complications ([place]) 36/62/9476  . 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 Encounter for general adult medical examination with abnormal findings 04/29/2016  . Low back pain without sciatica 04/28/2015  . Class 3 severe obesity without serious comorbidity with body mass index (BMI) of 40.0 to 44.9 in adult ([place]) 04/28/2015  . Pre-syncope 07/14/2014    Everlean Alstrom. Graylon Good, PT, DPT 06/25/20, 4:38 PM  [facility] [address] [place], [address] Phone: [phone]   Fax:  [phone]  Name: Lisa [name] MRN: [id number] Date of Birth: 17-Apr-1976

## 2020-06-29 ENCOUNTER — Encounter: Payer: Self-pay | Admitting: Physical Therapy

## 2020-06-29 ENCOUNTER — Other Ambulatory Visit: Payer: Self-pay

## 2020-06-29 ENCOUNTER — Ambulatory Visit: Payer: No Typology Code available for payment source | Admitting: Physical Therapy

## 2020-06-29 DIAGNOSIS — M542 Cervicalgia: Secondary | ICD-10-CM

## 2020-06-29 DIAGNOSIS — R519 Headache, unspecified: Secondary | ICD-10-CM

## 2020-06-29 DIAGNOSIS — M546 Pain in thoracic spine: Secondary | ICD-10-CM

## 2020-06-29 DIAGNOSIS — M5412 Radiculopathy, cervical region: Secondary | ICD-10-CM | POA: Diagnosis not present

## 2020-06-29 NOTE — Therapy (Signed)
[facility] [address], [place], [address] Phone: [phone]   Fax:  [phone]  Physical Therapy Treatment  Patient Details  Name: Lisa [name] MRN: [id number] Date of Birth: July 15, 1975 Referring Provider (PT): Molli Barrows, MD (pain clinic)   Encounter Date: 06/29/2020   PT End of Session - 06/29/20 1731    Visit Number 23    Number of Visits 24    Date for PT Re-Evaluation 08/20/20    Authorization Type [facility] [employer] reporting period from 06/18/2020    Progress Note Due on Visit 30    PT Start Time 5681    PT Stop Time 1810    PT Time Calculation (min) 39 min    Activity Tolerance Patient tolerated treatment well    Behavior During Therapy [facility] for tasks assessed/performed           Past Medical History:  Diagnosis Date  . Bronchitis    hx of  . Cervical radiculitis 03/03/2020  . Complication of anesthesia   . DDD (degenerative disc disease), cervical 02/28/2020  . Dysrhythmia    "does not see cardiologist"  . Family history of anesthesia complication    "morphine night terrors"  . GERD (gastroesophageal reflux disease)   . Headache(784.0)    "migraines"  . Hypertension   . PONV (postoperative nausea and vomiting)   . Shortness of breath    "wheezing"  . Weakness of left arm 03/03/2020    Past Surgical History:  Procedure Laterality Date  . CESAREAN SECTION     x 2  . MICROLARYNGOSCOPY WITH DILATION N/A 03/13/2020   Procedure: MICROLARYNGOSCOPY WITH DILATION w/JET VENT MITOMYCIN C;  Surgeon: Melida Quitter, MD;  Location: [facility];  Service: ENT;  Laterality: N/A;  . MICROLARYNGOSCOPY WITH LASER AND BALLOON DILATION N/A 07/20/2012   Procedure: MICROLARYNGOSCOPY WITH LASER AND BALLOON DILATION;  Surgeon: Melida Quitter, MD;  Location: [facility];  Service: ENT;  Laterality: N/A;  WITH JET VENTURI VENTILATION  . TONGUE FLAP RELEASE    . widom extractions      There were no vitals  filed for this visit.   Subjective Assessment - 06/29/20 1732    Subjective Patient reports she is feeling okay today but has had felt some neural tension over her left arm and felt it last night. She rested it against her body.. Was sore following last treatment session genrally. She did some groin holding today and friday and that seems to exacerbate her pain. She reports that before she had the neck problem, holding groins was a known agg factor for her headaches, etc. She did notice some tingling in the right first two fingers recently (previously this only happened when L UE was the worst).    Pertinent History Patient is a 45 y.o. female who presents to outpatient physical therapy with a referral for medical diagnosis cervicalgia, cervical degenerative disc disease. This patient's chief complaints consist of increased neck pain and new left UE pain/weakness/paresthesia and left sided rib pain on top of chronic left neck pain radiating to the left face and headache, leading to the following functional deficits: difficulty using left UE for manual tasks, using the phone, work tasks, holding pressure on wounds (unable), holding things without dropping them, opening packages, pulling back a stopper in a syringe, holding a cup, taking lids off, grooming, folding laundry, making the bed, anything that requires two hands and force, gardening, yardwork, raking, clipping, cutting, dressing,  showering, sleeping. Relevant past medical history and comorbidities include diabetes (currently controlled), idiopathic subglottic tracheal stenosis (surgery coming up end of month, causes shortness of breath upon exertion), low back pain on left side, obesity, GERD, when she takes caffeine she gest palpitations, high blood pressure.  Patient denies hx of cancer, stroke, seizures, lung problem (besides trachea problem), major cardiac events, unexplained weight loss, changes in bowel or bladder problems, new onset stumbling or  dropping things apart from described below.    Limitations House hold activities;Other (comment);Lifting   usinghands and force, gardening, yardwork, raking, clipping, cutting, dressing, showering, sleeping.   Diagnostic tests C-spine MRI report 02/20/2020: "IMPRESSION:1. Minimal disc osteophyte complex with left uncovertebral spurringat C5-C6 resulting in mild left foraminal stenosis.2. No canal stenosis at any level."    Patient Stated Goals "to not have arm pain"    Currently in Pain? Yes    Pain Score 2           TREATMENT: DOES have latex allergy  Therapeutic exercise:to centralize symptoms and improve ROM, strength, muscular endurance, and activity tolerance required for successful completion of functional activities. - seated cervical retraction with self overpressure x20 -seated thoracic extension over back of chair,x20 - seated active radial nerve tensioner, x10 reps moving each part of the chain with breaks to explain how to perform at home. Repeated with median nerve with 5 reps each.  - seated chest press, 3x10 at 20# - standing scapular rows, 3x10 at 20#  - seated overhead press, 3x10 with 6# DB - seated lat pull with stepwise scapular motion,  3x10 with 25# cable. - Education on HEP including handout   HOME EXERCISE PROGRAM Access Code: [email or web address] URL: [email or web address] Date: 06/29/2020 Prepared by: Rosita Kea  Exercises Cervical Retraction with Overpressure - 3 x daily - 2 sets - 10 reps Median Nerve Tensioner - 1 x daily - 1 sets - 10 reps Row with band/cable - 3 sets - 10 reps Push Up on Table - 3 sets - 10 reps  [email or web address] [id number] Home Exercise Program [[id number]]  Radial Nerve Stretch - Repeat 10 Times, Complete 1 Set, Perform 1 Times a Day  Median Nerve Stretch - Repeat 10 Times, Complete 1 Set, Perform 1 Times a Day    PT Education - 06/29/20 1736    Education Details Exercise purpose/form. Self management techniques     Person(s) Educated Patient    Methods Explanation;Demonstration;Tactile cues;Verbal cues    Comprehension Verbalized understanding;Returned demonstration;Verbal cues required;Tactile cues required;Need further instruction               PT Long Term Goals - 05/28/20 1842      PT LONG TERM GOAL #1   Title Be independent with a long-term home exercise program for self-management of symptoms.    Baseline initial HEP provided at IE (03/04/2020); currently participating in appropriate HEP (04/27/2020); continues to participate in appropriate HEP (05/28/2020);    Time 12    Period Weeks    Status Partially Met   TARGET DATE FOR ALL LONG TERM GOALS: 05/27/2020. TARGET DATE FOR UNMET GOALS UPDATED TO 08/20/2020     PT LONG TERM GOAL #2   Title Demonstrate improved FOTO score by 10 units to demonstrate improvement in overall condition and self-reported functional ability.    Baseline deferred to visit 2 (03/04/2020); 54 at visit 2 (03/10/2020); 69 at visit 10 (04/27/2020); 61 at visit 17 (05/28/2020);    Time 12    Period Weeks    Status Partially  Met      PT LONG TERM GOAL #3   Title Improve left UE  strength to equal or greater than  4+/5 for improved ability to allow patient to complete valued functional tasks such as lifting, pressing, grasping, holding, etc,  with less difficulty.    Baseline wrist extension 3/5 and reports weakness with repetition (03/05/2020); nearly met - lacks L wrist flexion (04/27/2020); met except left wrist extension (05/28/2020);    Time 12    Period Weeks    Status Partially Met      PT LONG TERM GOAL #4   Title Patient will report no longer having pain down the left arm to improve ability to use left UE for functional tasks such as grasping, pressing, stabilizing.    Baseline 8/10 (03/05/2020); 4/10 highest in the last two weeks (04/27/2020); 3/10 highest in the last two weeks (05/28/2020);    Time 12    Period Weeks    Status Partially Met      PT LONG  TERM GOAL #5   Title Complete community, work and/or recreational activities without limitation due to current condition.    Baseline Functional Limitations: using left UE for manual tasks, using the phone, work tasks, holding pressure on wounds (unable), holding things without dropping them, opening packages, pulling back a stopper in a syringe, holding a cup, taking lids off, grooming, folding laundry, making the bed, anything that requires two hands and force, gardening, yardwork, raking, clipping, cutting, dressing, showering, sleeping (03/04/2020); improved with all task but still gets tired early in the left UE and switches hands if able - also still cannot button bra and has difficulty putting on a jacket (04/27/2020); continues to improve but still has difficulty with jacket, holding pots/pans (05/28/2020);    Time 12    Period Weeks    Status Partially Met                 Plan - 06/29/20 1805    Clinical Impression Statement Patient tolerated treatment well overall but did have some increased nerve tension in the radial nerve that was addressed with nerve tensioner's. Continued strength exercises for UE strength and endurance. Patient would benefit from continued management of limiting condition by skilled physical therapist to address remaining impairments and functional limitations to work towards stated goals and return to PLOF or maximal functional independence.    Personal Factors and Comorbidities Age;Comorbidity 3+;Past/Current Experience;Fitness;Time since onset of injury/illness/exacerbation;Profession;Education    Comorbidities Relevant past medical history and comorbidities include diabetes (currently controlled), idiopathic subglottic tracheal stenosis (surgery coming up end of month, causes shortness of breath upon exertion), low back pain on left side, obesity, GERD, when she takes caffeine she gest palpitations, high blood pressure.    Examination-Activity Limitations  Bathing;Hygiene/Grooming;Lift;Caring for Others;Reach Overhead;Carry;Dressing;Sleep    Examination-Participation Restrictions Community Activity;Occupation;Yard Work;Meal Prep;Laundry;Cleaning;Interpersonal Relationship;Other   using left UE for manual tasks, using the phone, holding pressure on wounds (unable), holding things without dropping them, opening packages, pulling back a stopper in a syringe, holding a cup, taking lids off, making the bed   Stability/Clinical Decision Making Evolving/Moderate complexity    Rehab Potential Good    PT Frequency 2x / week    PT Duration 12 weeks    PT Treatment/Interventions Moist Heat;Therapeutic exercise;Manual techniques;Dry needling;Electrical Stimulation;ADLs/Self Care Home Management;Cryotherapy;Traction;Therapeutic activities;Neuromuscular re-education;Patient/family education;Passive range of motion;Taping;Joint Manipulations;Spinal Manipulations    PT Next Visit Plan manual therapy, exercise as tolerated, transition to independent care as stability in improvment is demonstrated  PT Home Exercise Plan medbridge Access Code: [email or web address] ; [email or web address]   [id number] Home Exercise Program ([id number])    Consulted and Agree with Plan of Care Patient           Patient will benefit from skilled therapeutic intervention in order to improve the following deficits and impairments:  Pain,Decreased activity tolerance,Impaired sensation,Improper body mechanics,Postural dysfunction,Increased muscle spasms,Decreased coordination,Impaired tone,Decreased endurance,Decreased range of motion,Decreased strength,Impaired perceived functional ability,Obesity,Impaired UE functional use  Visit Diagnosis: Radiculopathy, cervical region  Cervicalgia  Nonintractable headache, unspecified chronicity pattern, unspecified headache type  Pain in thoracic spine     Problem List Patient Active Problem List   Diagnosis Date Noted  . Elevated blood pressure reading in office  without diagnosis of hypertension 06/21/2020  . Cervical radiculitis 03/03/2020  . Weakness of left arm 03/03/2020  . Cervicalgia 02/28/2020  . Spondylosis, cervical, 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  . Acute URI 12/04/2019  . Loud snoring 08/01/2019  . B12 deficiency 08/01/2019  . Eczema of left external ear 08/01/2019  . Hyperlipidemia associated with type 2 diabetes mellitus ([facility]) 07/31/2019  . Nonallopathic lesion of cervical region 04/29/2019  . Nonallopathic lesion of thoracic region 04/29/2019  . Nonallopathic lesion of rib cage 04/29/2019  . Cervical radiculopathy at C7 03/26/2019  . Abnormal breast exam 01/24/2019  . Allergic rhinitis due to pollen 08/18/2018  . Abnormal mammogram of right breast 02/27/2018  . Type 2 diabetes mellitus without complications ([facility]) 13/12/6576  . 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 Encounter for general adult medical examination with abnormal findings 04/29/2016  . Low back pain without sciatica 04/28/2015  . Class 3 severe obesity without serious comorbidity with body mass index (BMI) of 40.0 to 44.9 in adult ([facility]) 04/28/2015  . Pre-syncope 07/14/2014    Everlean Alstrom. Graylon Good, PT, DPT 06/29/20, 7:51 PM  [facility] [address] [place], [address] Phone: [phone]   Fax:  [phone]  Name: Lisa [name] MRN: [id number] Date of Birth: [birth year]

## 2020-07-01 ENCOUNTER — Telehealth: Payer: Self-pay | Admitting: Internal Medicine

## 2020-07-01 ENCOUNTER — Other Ambulatory Visit: Payer: No Typology Code available for payment source

## 2020-07-01 ENCOUNTER — Other Ambulatory Visit: Payer: Self-pay

## 2020-07-01 ENCOUNTER — Encounter: Payer: No Typology Code available for payment source | Admitting: Physical Therapy

## 2020-07-01 DIAGNOSIS — R7401 Elevation of levels of liver transaminase levels: Secondary | ICD-10-CM

## 2020-07-01 DIAGNOSIS — E119 Type 2 diabetes mellitus without complications: Secondary | ICD-10-CM

## 2020-07-01 NOTE — Telephone Encounter (Signed)
Lisa [name] from [employer] [phone] opt-1( ext [phone]) in about patient stated that they had an internal problem could not run her A1C need to reorder it

## 2020-07-01 NOTE — Telephone Encounter (Signed)
I spoke with Lisa [name] at [facility] & pt is scheduled to return this Friday for a redrawn on labs not resulted.

## 2020-07-01 NOTE — Addendum Note (Signed)
Addended by: Warden Fillers on: 07/01/2020 05:01 PM   Modules accepted: Orders

## 2020-07-03 ENCOUNTER — Other Ambulatory Visit: Payer: No Typology Code available for payment source

## 2020-07-03 DIAGNOSIS — E119 Type 2 diabetes mellitus without complications: Secondary | ICD-10-CM

## 2020-07-06 ENCOUNTER — Encounter: Payer: No Typology Code available for payment source | Admitting: Physical Therapy

## 2020-07-07 NOTE — Addendum Note (Signed)
Addended by: Warden Fillers on: 07/07/2020 04:40 PM   Modules accepted: Orders

## 2020-07-08 ENCOUNTER — Other Ambulatory Visit (INDEPENDENT_AMBULATORY_CARE_PROVIDER_SITE_OTHER): Payer: No Typology Code available for payment source

## 2020-07-08 ENCOUNTER — Encounter: Payer: No Typology Code available for payment source | Admitting: Physical Therapy

## 2020-07-08 ENCOUNTER — Other Ambulatory Visit: Payer: Self-pay

## 2020-07-08 DIAGNOSIS — E538 Deficiency of other specified B group vitamins: Secondary | ICD-10-CM

## 2020-07-08 DIAGNOSIS — E559 Vitamin D deficiency, unspecified: Secondary | ICD-10-CM | POA: Diagnosis not present

## 2020-07-08 DIAGNOSIS — R7401 Elevation of levels of liver transaminase levels: Secondary | ICD-10-CM | POA: Diagnosis not present

## 2020-07-08 DIAGNOSIS — R635 Abnormal weight gain: Secondary | ICD-10-CM | POA: Diagnosis not present

## 2020-07-08 DIAGNOSIS — E782 Mixed hyperlipidemia: Secondary | ICD-10-CM | POA: Diagnosis not present

## 2020-07-08 DIAGNOSIS — R030 Elevated blood-pressure reading, without diagnosis of hypertension: Secondary | ICD-10-CM

## 2020-07-08 DIAGNOSIS — E119 Type 2 diabetes mellitus without complications: Secondary | ICD-10-CM | POA: Diagnosis not present

## 2020-07-08 LAB — COMPREHENSIVE METABOLIC PANEL
ALT: 41 U/L — ABNORMAL HIGH (ref 0–35)
AST: 25 U/L (ref 0–37)
Albumin: 4 g/dL (ref 3.5–5.2)
Alkaline Phosphatase: 53 U/L (ref 39–117)
BUN: 13 mg/dL (ref 6–23)
CO2: 26 mEq/L (ref 19–32)
Calcium: 9.5 mg/dL (ref 8.4–10.5)
Chloride: 104 mEq/L (ref 96–112)
Creatinine, Ser: 0.74 mg/dL (ref 0.40–1.20)
GFR: 99.03 mL/min (ref >60.00)
Glucose, Bld: 130 mg/dL — ABNORMAL HIGH (ref 70–99)
Potassium: 4.4 mEq/L (ref 3.5–5.1)
Sodium: 138 mEq/L (ref 135–145)
Total Bilirubin: 0.6 mg/dL (ref 0.2–1.2)
Total Protein: 6.5 g/dL (ref 6.0–8.3)

## 2020-07-08 LAB — LIPID PANEL
Cholesterol: 174 mg/dL (ref 0–200)
HDL: 40 mg/dL (ref >39.00)
LDL Cholesterol: 107 mg/dL — ABNORMAL HIGH (ref 0–99)
NonHDL: 133.56
Total CHOL/HDL Ratio: 4
Triglycerides: 133 mg/dL (ref 0.0–149.0)
VLDL: 26.6 mg/dL (ref 0.0–40.0)

## 2020-07-08 LAB — HEPATIC FUNCTION PANEL
ALT: 41 U/L — ABNORMAL HIGH (ref 0–35)
AST: 25 U/L (ref 0–37)
Albumin: 4 g/dL (ref 3.5–5.2)
Alkaline Phosphatase: 53 U/L (ref 39–117)
Bilirubin, Direct: 0.1 mg/dL (ref 0.0–0.3)
Total Bilirubin: 0.6 mg/dL (ref 0.2–1.2)
Total Protein: 6.5 g/dL (ref 6.0–8.3)

## 2020-07-08 LAB — VITAMIN B12: Vitamin B-12: 220 pg/mL (ref 211–911)

## 2020-07-08 LAB — VITAMIN D 25 HYDROXY (VIT D DEFICIENCY, FRACTURES): VITD: 22.93 ng/mL — ABNORMAL LOW (ref 30.00–100.00)

## 2020-07-08 LAB — HEMOGLOBIN A1C: Hgb A1c MFr Bld: 6.7 % — ABNORMAL HIGH (ref 4.6–6.5)

## 2020-07-08 LAB — MICROALBUMIN / CREATININE URINE RATIO
Creatinine,U: 66.6 mg/dL
Microalb Creat Ratio: 1.1 mg/g (ref 0.0–30.0)
Microalb, Ur: <0.7 mg/dL (ref 0.0–1.9)

## 2020-07-08 LAB — TSH: TSH: 3.55 u[IU]/mL (ref 0.35–4.50)

## 2020-07-08 NOTE — Addendum Note (Signed)
Addended by: Sherlene Shams on: 07/08/2020 08:43 PM   Modules accepted: Orders

## 2020-07-09 ENCOUNTER — Encounter: Payer: Self-pay | Admitting: Physical Therapy

## 2020-07-09 ENCOUNTER — Ambulatory Visit: Payer: No Typology Code available for payment source | Admitting: Physical Therapy

## 2020-07-09 DIAGNOSIS — M546 Pain in thoracic spine: Secondary | ICD-10-CM

## 2020-07-09 DIAGNOSIS — M542 Cervicalgia: Secondary | ICD-10-CM

## 2020-07-09 DIAGNOSIS — R519 Headache, unspecified: Secondary | ICD-10-CM

## 2020-07-09 DIAGNOSIS — M5412 Radiculopathy, cervical region: Secondary | ICD-10-CM | POA: Diagnosis not present

## 2020-07-09 NOTE — Therapy (Signed)
[facility] [address], [place], [address] Phone: [phone]   Fax:  [phone]  Physical Therapy Treatment  Patient Details  Name: Lisa [name] MRN: [id number] Date of Birth: 1975-11-09 Referring Provider (PT): Molli Barrows, MD (pain clinic)   Encounter Date: 07/09/2020   PT End of Session - 07/09/20 1818    Visit Number 24    Number of Visits 24    Date for PT Re-Evaluation 08/20/20    Authorization Type  FOCUS reporting period from 06/18/2020    Progress Note Due on Visit 30    PT Start Time 1750    PT Stop Time 1828    PT Time Calculation (min) 38 min    Activity Tolerance Patient tolerated treatment well    Behavior During Therapy [facility] for tasks assessed/performed           Past Medical History:  Diagnosis Date  . Bronchitis    hx of  . Cervical radiculitis 03/03/2020  . Complication of anesthesia   . DDD (degenerative disc disease), cervical 02/28/2020  . Dysrhythmia    "does not see cardiologist"  . Family history of anesthesia complication    "morphine night terrors"  . GERD (gastroesophageal reflux disease)   . Headache(784.0)    "migraines"  . Hypertension   . PONV (postoperative nausea and vomiting)   . Shortness of breath    "wheezing"  . Weakness of left arm 03/03/2020    Past Surgical History:  Procedure Laterality Date  . CESAREAN SECTION     x 2  . MICROLARYNGOSCOPY WITH DILATION N/A 03/13/2020   Procedure: MICROLARYNGOSCOPY WITH DILATION w/JET VENT MITOMYCIN C;  Surgeon: Melida Quitter, MD;  Location: [facility];  Service: ENT;  Laterality: N/A;  . MICROLARYNGOSCOPY WITH LASER AND BALLOON DILATION N/A 07/20/2012   Procedure: MICROLARYNGOSCOPY WITH LASER AND BALLOON DILATION;  Surgeon: Melida Quitter, MD;  Location: [facility];  Service: ENT;  Laterality: N/A;  WITH JET VENTURI VENTILATION  . TONGUE FLAP RELEASE    . widom extractions      There were no vitals  filed for this visit.   Subjective Assessment - 07/09/20 1752    Subjective Patient reports she has continued to have more headaches and actually tried to get back in last week. She went to see her massage therapist last week instead and she worked a lot on her neck and shoulders and it seemed to help but her massage therapist said she did not have a lot of pain during the soft tissue work. She has a headache at the base of her scull and over the top that is 2/10. She continues to have the pain in the left shoulder in arm intermittatnly but she has been doing the strengthening exercises 3 times since last PT session and she does feel better including her headache.    Pertinent History Patient is a 45 y.o. female who presents to outpatient physical therapy with a referral for medical diagnosis cervicalgia, cervical degenerative disc disease. This patient's chief complaints consist of increased neck pain and new left UE pain/weakness/paresthesia and left sided rib pain on top of chronic left neck pain radiating to the left face and headache, leading to the following functional deficits: difficulty using left UE for manual tasks, using the phone, work tasks, holding pressure on wounds (unable), holding things without dropping them, opening packages, pulling back a stopper in a syringe, holding a cup, taking lids  off, grooming, folding laundry, making the bed, anything that requires two hands and force, gardening, yardwork, raking, clipping, cutting, dressing, showering, sleeping. Relevant past medical history and comorbidities include diabetes (currently controlled), idiopathic subglottic tracheal stenosis (surgery coming up end of month, causes shortness of breath upon exertion), low back pain on left side, obesity, GERD, when she takes caffeine she gest palpitations, high blood pressure.  Patient denies hx of cancer, stroke, seizures, lung problem (besides trachea problem), major cardiac events, unexplained  weight loss, changes in bowel or bladder problems, new onset stumbling or dropping things apart from described below.    Limitations House hold activities;Other (comment);Lifting   usinghands and force, gardening, yardwork, raking, clipping, cutting, dressing, showering, sleeping.   Diagnostic tests C-spine MRI report 02/20/2020: "IMPRESSION:1. Minimal disc osteophyte complex with left uncovertebral spurringat C5-C6 resulting in mild left foraminal stenosis.2. No canal stenosis at any level."    Patient Stated Goals "to not have arm pain"    Currently in Pain? Yes    Pain Score 2           TREATMENT: DOES have latex allergy  Therapeutic exercise:to centralize symptoms and improve ROM, strength, muscular endurance, and activity tolerance required for successful completion of functional activities. - seated cervical retraction with self overpressure x20 -seated thoracic extension over back of chair,x20 - supine chin tuck x10 - supine chin tuck with lift, 2x10 - prone cervical retraction 2x10 - seated lat pull with coordinated or stepwise scapular motion,  2x10 with 25# cable. - Education on HEP including handout    Manual therapy: to reduce pain and tissue tension, improve range of motion, neuromodulation, in order to promote improved ability to complete functional activities. - hooklying STM to posterior cervical spine in suboccipital region with intermittant manual distraction. - education about use of LAX balls for self mobilization.    HOME EXERCISE PROGRAM Access Code: [email or web address] URL: [email or web address] Date: 07/09/2020 Prepared by: Rosita Kea  Exercises Cervical Retraction with Overpressure - 3 x daily - 2 sets - 10 reps Median Nerve Tensioner - 1 x daily - 1 sets - 10 reps Row with band/cable - 3 sets - 10 reps Push Up on Table - 3 sets - 10 reps Prone Cervical Retraction - 3 x weekly - 2 sets - 10 reps - 1 second hold Supine Deep Neck Flexor  Training - Repetitions - 3 x weekly - 2 sets - 10 reps - 1 second hold  [email or web address] [id number] Home Exercise Program [[id number]]  Radial Nerve Stretch - Repeat 10 Times, Complete 1 Set, Perform 1 Times a Day  Median Nerve Stretch - Repeat 10 Times, Complete 1 Set, Perform 1 Times a Day    PT Education - 07/09/20 1754    Education Details Exercise purpose/form. Self management techniques    Person(s) Educated Patient    Methods Explanation;Demonstration;Tactile cues;Verbal cues    Comprehension Verbalized understanding;Returned demonstration;Verbal cues required;Tactile cues required;Need further instruction               PT Long Term Goals - 05/28/20 1842      PT LONG TERM GOAL #1   Title Be independent with a long-term home exercise program for self-management of symptoms.    Baseline initial HEP provided at IE (03/04/2020); currently participating in appropriate HEP (04/27/2020); continues to participate in appropriate HEP (05/28/2020);    Time 12    Period Weeks    Status Partially Met   TARGET DATE FOR ALL LONG TERM GOALS: 05/27/2020. TARGET DATE FOR  UNMET GOALS UPDATED TO 08/20/2020     PT LONG TERM GOAL #2   Title Demonstrate improved FOTO score by 10 units to demonstrate improvement in overall condition and self-reported functional ability.    Baseline deferred to visit 2 (03/04/2020); 54 at visit 2 (03/10/2020); 69 at visit 10 (04/27/2020); 61 at visit 17 (05/28/2020);    Time 12    Period Weeks    Status Partially Met      PT LONG TERM GOAL #3   Title Improve left UE  strength to equal or greater than  4+/5 for improved ability to allow patient to complete valued functional tasks such as lifting, pressing, grasping, holding, etc,  with less difficulty.    Baseline wrist extension 3/5 and reports weakness with repetition (03/05/2020); nearly met - lacks L wrist flexion (04/27/2020); met except left wrist extension (05/28/2020);    Time 12    Period Weeks    Status  Partially Met      PT LONG TERM GOAL #4   Title Patient will report no longer having pain down the left arm to improve ability to use left UE for functional tasks such as grasping, pressing, stabilizing.    Baseline 8/10 (03/05/2020); 4/10 highest in the last two weeks (04/27/2020); 3/10 highest in the last two weeks (05/28/2020);    Time 12    Period Weeks    Status Partially Met      PT LONG TERM GOAL #5   Title Complete community, work and/or recreational activities without limitation due to current condition.    Baseline Functional Limitations: using left UE for manual tasks, using the phone, work tasks, holding pressure on wounds (unable), holding things without dropping them, opening packages, pulling back a stopper in a syringe, holding a cup, taking lids off, grooming, folding laundry, making the bed, anything that requires two hands and force, gardening, yardwork, raking, clipping, cutting, dressing, showering, sleeping (03/04/2020); improved with all task but still gets tired early in the left UE and switches hands if able - also still cannot button bra and has difficulty putting on a jacket (04/27/2020); continues to improve but still has difficulty with jacket, holding pots/pans (05/28/2020);    Time 12    Period Weeks    Status Partially Met                 Plan - 07/09/20 1930    Clinical Impression Statement Patient tolerated treatment well overall with some report of soreness at the base of the scull during cervical spine exercises. Discussed posture and introduced cervical spine strengthening. Patient did have some soreness that was familiar with the neck strengthening exercises that did not remain elevated following. Manual therapy provided for symptom relief at end of session. Patient would benefit from continued management of limiting condition by skilled physical therapist to address remaining impairments and functional limitations to work towards stated goals and return  to PLOF or maximal functional independence.    Personal Factors and Comorbidities Age;Comorbidity 3+;Past/Current Experience;Fitness;Time since onset of injury/illness/exacerbation;Profession;Education    Comorbidities Relevant past medical history and comorbidities include diabetes (currently controlled), idiopathic subglottic tracheal stenosis (surgery coming up end of month, causes shortness of breath upon exertion), low back pain on left side, obesity, GERD, when she takes caffeine she gest palpitations, high blood pressure.    Examination-Activity Limitations Bathing;Hygiene/Grooming;Lift;Caring for Others;Reach Overhead;Carry;Dressing;Sleep    Examination-Participation Restrictions Community Activity;Occupation;Yard Work;Meal Prep;Laundry;Cleaning;Interpersonal Relationship;Other   using left UE for manual tasks, using the phone, holding  pressure on wounds (unable), holding things without dropping them, opening packages, pulling back a stopper in a syringe, holding a cup, taking lids off, making the bed   Stability/Clinical Decision Making Evolving/Moderate complexity    Rehab Potential Good    PT Frequency 2x / week    PT Duration 12 weeks    PT Treatment/Interventions Moist Heat;Therapeutic exercise;Manual techniques;Dry needling;Electrical Stimulation;ADLs/Self Care Home Management;Cryotherapy;Traction;Therapeutic activities;Neuromuscular re-education;Patient/family education;Passive range of motion;Taping;Joint Manipulations;Spinal Manipulations    PT Next Visit Plan manual therapy, exercise as tolerated, transition to independent care as stability in improvment is demonstrated    PT Home Exercise Plan medbridge Access Code: [email or web address] ; [email or web address]   [id number] Home Exercise Program ([id number])    Consulted and Agree with Plan of Care Patient           Patient will benefit from skilled therapeutic intervention in order to improve the following deficits and impairments:  Pain,Decreased activity  tolerance,Impaired sensation,Improper body mechanics,Postural dysfunction,Increased muscle spasms,Decreased coordination,Impaired tone,Decreased endurance,Decreased range of motion,Decreased strength,Impaired perceived functional ability,Obesity,Impaired UE functional use  Visit Diagnosis: Radiculopathy, cervical region  Cervicalgia  Nonintractable headache, unspecified chronicity pattern, unspecified headache type  Pain in thoracic spine     Problem List Patient Active Problem List   Diagnosis Date Noted  . Elevated blood pressure reading in office without diagnosis of hypertension 06/21/2020  . Cervical radiculitis 03/03/2020  . Weakness of left arm 03/03/2020  . Cervicalgia 02/28/2020  . Spondylosis, cervical, 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  . Acute URI 12/04/2019  . Loud snoring 08/01/2019  . B12 deficiency 08/01/2019  . Eczema of left external ear 08/01/2019  . Hyperlipidemia associated with type 2 diabetes mellitus ([place]) 07/31/2019  . Nonallopathic lesion of cervical region 04/29/2019  . Nonallopathic lesion of thoracic region 04/29/2019  . Nonallopathic lesion of rib cage 04/29/2019  . Cervical radiculopathy at C7 03/26/2019  . Abnormal breast exam 01/24/2019  . Allergic rhinitis due to pollen 08/18/2018  . Abnormal mammogram of right breast 02/27/2018  . Type 2 diabetes mellitus without complications ([place]) 53/29/9242  . 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 Encounter for general adult medical examination with abnormal findings 04/29/2016  . Low back pain without sciatica 04/28/2015  . Class 3 severe obesity without serious comorbidity with body mass index (BMI) of 40.0 to 44.9 in adult ([place]) 04/28/2015  . Pre-syncope 07/14/2014    Everlean Alstrom. Graylon Good, PT, DPT 07/09/20, 7:30 PM  [facility] [address] [place], [address] Phone: [phone]   Fax:  [phone]  Name: Lisa [name] MRN:  [id number] Date of Birth: [birth year]

## 2020-07-10 ENCOUNTER — Other Ambulatory Visit: Payer: Self-pay | Admitting: Internal Medicine

## 2020-07-10 MED ORDER — CYANOCOBALAMIN 1000 MCG/ML IJ SOLN: 1000.0000 ug | INTRAMUSCULAR | 11 refills | Status: DC

## 2020-07-14 ENCOUNTER — Encounter: Payer: No Typology Code available for payment source | Admitting: Physical Therapy

## 2020-07-14 ENCOUNTER — Other Ambulatory Visit: Payer: Self-pay

## 2020-07-16 ENCOUNTER — Ambulatory Visit: Payer: No Typology Code available for payment source | Admitting: Physical Therapy

## 2020-07-28 ENCOUNTER — Ambulatory Visit: Payer: No Typology Code available for payment source | Attending: Anesthesiology | Admitting: Physical Therapy

## 2020-07-28 ENCOUNTER — Other Ambulatory Visit: Payer: Self-pay

## 2020-07-28 ENCOUNTER — Encounter: Payer: Self-pay | Admitting: Physical Therapy

## 2020-07-28 DIAGNOSIS — M546 Pain in thoracic spine: Secondary | ICD-10-CM | POA: Insufficient documentation

## 2020-07-28 DIAGNOSIS — R519 Headache, unspecified: Secondary | ICD-10-CM | POA: Insufficient documentation

## 2020-07-28 DIAGNOSIS — M542 Cervicalgia: Secondary | ICD-10-CM | POA: Diagnosis present

## 2020-07-28 DIAGNOSIS — M5412 Radiculopathy, cervical region: Secondary | ICD-10-CM | POA: Insufficient documentation

## 2020-07-28 NOTE — Therapy (Signed)
[facility] [address] [place], [address] Phone: [phone]   Fax:  [phone]  Physical Therapy Treatment  Patient Details  Name: Lisa [name] MRN: [id number] Date of Birth: 04-May-1976 Referring Provider (PT): Molli Barrows, MD (pain clinic)   Encounter Date: 07/28/2020   PT End of Session - 07/28/20 1753    Visit Number 25    Number of Visits 41    Date for PT Re-Evaluation 08/20/20    Authorization Type [facility] [employer]OC[employer] reporting period from 06/18/2020    Progress Note Due on Visit 30    PT Start Time 1652    PT Stop Time 1735    PT Time Calculation (min) 43 min    Activity Tolerance Patient tolerated treatment well    Behavior During Therapy [facility] for tasks assessed/performed           Past Medical History:  Diagnosis Date  . Bronchitis    hx of  . Cervical radiculitis 03/03/2020  . Complication of anesthesia   . DDD (degenerative disc disease), cervical 02/28/2020  . Dysrhythmia    "does not see cardiologist"  . Family history of anesthesia complication    "morphine night terrors"  . GERD (gastroesophageal reflux disease)   . Headache(784.0)    "migraines"  . Hypertension   . PONV (postoperative nausea and vomiting)   . Shortness of breath    "wheezing"  . Weakness of left arm 03/03/2020    Past Surgical History:  Procedure Laterality Date  . CESAREAN SECTION     x 2  . MICROLARYNGOSCOPY WITH DILATION N/A 03/13/2020   Procedure: MICROLARYNGOSCOPY WITH DILATION w/JET VENT MITOMYCIN C;  Surgeon: Melida Quitter, MD;  Location: [facility];  Service: ENT;  Laterality: N/A;  . MICROLARYNGOSCOPY WITH LASER AND BALLOON DILATION N/A 07/20/2012   Procedure: MICROLARYNGOSCOPY WITH LASER AND BALLOON DILATION;  Surgeon: Melida Quitter, MD;  Location: [place];  Service: ENT;  Laterality: N/A;  WITH JET VENTURI VENTILATION  . TONGUE FLAP RELEASE    . widom extractions      There were no vitals  filed for this visit.   Subjective Assessment - 07/28/20 1654    Subjective [name] reports no pain upon arrival. She states she has had some headaches and somet tingling in the left UE that has improved with nerve glides. Also had some weakness in her left arm when holding up a sheet today. She has been out because her husband had [place]. Ribs have been a bit sore, especially during a massage on Friday. Has not been strengthening  but has continued to do her stretches. Did use arm a lot last week.    Pertinent History Patient is a 45 y.o. female who presents to outpatient physical therapy with a referral for medical diagnosis cervicalgia, cervical degenerative disc disease. This patient's chief complaints consist of increased neck pain and new left UE pain/weakness/paresthesia and left sided rib pain on top of chronic left neck pain radiating to the left face and headache, leading to the following functional deficits: difficulty using left UE for manual tasks, using the phone, work tasks, holding pressure on wounds (unable), holding things without dropping them, opening packages, pulling back a stopper in a syringe, holding a cup, taking lids off, grooming, folding laundry, making the bed, anything that requires two hands and force, gardening, yardwork, raking, clipping, cutting, dressing, showering, sleeping. Relevant past medical history and comorbidities include diabetes (currently controlled), idiopathic  subglottic tracheal stenosis (surgery coming up end of month, causes shortness of breath upon exertion), low back pain on left side, obesity, GERD, when she takes caffeine she gest palpitations, high blood pressure.  Patient denies hx of cancer, stroke, seizures, lung problem (besides trachea problem), major cardiac events, unexplained weight loss, changes in bowel or bladder problems, new onset stumbling or dropping things apart from described below.    Limitations House hold activities;Other  (comment);Lifting   usinghands and force, gardening, yardwork, raking, clipping, cutting, dressing, showering, sleeping.   Diagnostic tests C-spine MRI report 02/20/2020: "IMPRESSION:1. Minimal disc osteophyte complex with left uncovertebral spurringat C5-C6 resulting in mild left foraminal stenosis.2. No canal stenosis at any level."    Patient Stated Goals "to not have arm pain"    Currently in Pain? No/denies           FOTO = 74 (07/28/20);   TREATMENT: DOES have latex allergy  Therapeutic exercise:to centralize symptoms and improve ROM, strength, muscular endurance, and activity tolerance required for successful completion of functional activities. - seated chest press, 3x10 at 20# - standing scapular rows, 3x10 at 20#  - seated overhead press, 3x10 with 6# DB - seated lat pull with stepwise scapular motion,  3x10 with 25# cable. - standing scaption full range with 3# DB in each hand, 3x10  Manual therapy: to reduce pain and tissue tension, improve range of motion, neuromodulation, in order to promote improved ability to complete functional activities. - hooklying STM to posterior cervical spine in suboccipital region with intermittant manual distraction.  HOME EXERCISE PROGRAM Access Code: [email or web address] URL: [email or web address] Date: 07/09/2020 Prepared by: Rosita Kea  Exercises Cervical Retraction with Overpressure - 3 x daily - 2 sets - 10 reps Median Nerve Tensioner - 1 x daily - 1 sets - 10 reps Row with band/cable - 3 sets - 10 reps Push Up on Table - 3 sets - 10 reps Prone Cervical Retraction - 3 x weekly - 2 sets - 10 reps - 1 second hold Supine Deep Neck Flexor Training - Repetitions - 3 x weekly - 2 sets - 10 reps - 1 second hold  [email or web address] [id number] Home Exercise Program [[id number]]  Radial Nerve Stretch - Repeat 10 Times, Complete 1 Set, Perform 1 Times a Day  Median Nerve Stretch - Repeat 10 Times, Complete 1 Set, Perform 1 Times a  Day    PT Education - 07/28/20 1757    Education Details Exercise purpose/form. Self management techniques    Person(s) Educated Patient    Methods Explanation;Demonstration;Verbal cues    Comprehension Verbalized understanding;Returned demonstration               PT Long Term Goals - 05/28/20 1842      PT LONG TERM GOAL #1   Title Be independent with a long-term home exercise program for self-management of symptoms.    Baseline initial HEP provided at IE (03/04/2020); currently participating in appropriate HEP (04/27/2020); continues to participate in appropriate HEP (05/28/2020);    Time 12    Period Weeks    Status Partially Met   TARGET DATE FOR ALL LONG TERM GOALS: 05/27/2020. TARGET DATE FOR UNMET GOALS UPDATED TO 08/20/2020     PT LONG TERM GOAL #2   Title Demonstrate improved FOTO score by 10 units to demonstrate improvement in overall condition and self-reported functional ability.    Baseline deferred to visit 2 (03/04/2020); 54 at visit 2 (03/10/2020); 69 at visit 10 (04/27/2020); 61 at visit 17 (05/28/2020);  Time 12    Period Weeks    Status Partially Met      PT LONG TERM GOAL #3   Title Improve left UE  strength to equal or greater than  4+/5 for improved ability to allow patient to complete valued functional tasks such as lifting, pressing, grasping, holding, etc,  with less difficulty.    Baseline wrist extension 3/5 and reports weakness with repetition (03/05/2020); nearly met - lacks L wrist flexion (04/27/2020); met except left wrist extension (05/28/2020);    Time 12    Period Weeks    Status Partially Met      PT LONG TERM GOAL #4   Title Patient will report no longer having pain down the left arm to improve ability to use left UE for functional tasks such as grasping, pressing, stabilizing.    Baseline 8/10 (03/05/2020); 4/10 highest in the last two weeks (04/27/2020); 3/10 highest in the last two weeks (05/28/2020);    Time 12    Period Weeks    Status  Partially Met      PT LONG TERM GOAL #5   Title Complete community, work and/or recreational activities without limitation due to current condition.    Baseline Functional Limitations: using left UE for manual tasks, using the phone, work tasks, holding pressure on wounds (unable), holding things without dropping them, opening packages, pulling back a stopper in a syringe, holding a cup, taking lids off, grooming, folding laundry, making the bed, anything that requires two hands and force, gardening, yardwork, raking, clipping, cutting, dressing, showering, sleeping (03/04/2020); improved with all task but still gets tired early in the left UE and switches hands if able - also still cannot button bra and has difficulty putting on a jacket (04/27/2020); continues to improve but still has difficulty with jacket, holding pots/pans (05/28/2020);    Time 12    Period Weeks    Status Partially Met                 Plan - 07/28/20 1753    Clinical Impression Statement Patient tolerated treatment well without increased symptoms. Patient appears to have reached some stability of improved symptoms with ability to self manage although not completely better. Patient is improving confidence in ability to self manage and after discussion with PT agreed she will call to cancel her next appointment if she continues to feel good throughout this week. Patient was sick recently and still feels some fatigue following this. Patient would benefit from continued management of limiting condition by skilled physical therapist to address remaining impairments and functional limitations to work towards stated goals and return to PLOF or maximal functional independence.    Personal Factors and Comorbidities Age;Comorbidity 3+;Past/Current Experience;Fitness;Time since onset of injury/illness/exacerbation;Profession;Education    Comorbidities Relevant past medical history and comorbidities include diabetes (currently  controlled), idiopathic subglottic tracheal stenosis (surgery coming up end of month, causes shortness of breath upon exertion), low back pain on left side, obesity, GERD, when she takes caffeine she gest palpitations, high blood pressure.    Examination-Activity Limitations Bathing;Hygiene/Grooming;Lift;Caring for Others;Reach Overhead;Carry;Dressing;Sleep    Examination-Participation Restrictions Community Activity;Occupation;Yard Work;Meal Prep;Laundry;Cleaning;Interpersonal Relationship;Other   using left UE for manual tasks, using the phone, holding pressure on wounds (unable), holding things without dropping them, opening packages, pulling back a stopper in a syringe, holding a cup, taking lids off, making the bed   Stability/Clinical Decision Making Evolving/Moderate complexity    Rehab Potential Good    PT Frequency 2x / week  PT Duration 12 weeks    PT Treatment/Interventions Moist Heat;Therapeutic exercise;Manual techniques;Dry needling;Electrical Stimulation;ADLs/Self Care Home Management;Cryotherapy;Traction;Therapeutic activities;Neuromuscular re-education;Patient/family education;Passive range of motion;Taping;Joint Manipulations;Spinal Manipulations    PT Next Visit Plan manual therapy, exercise as tolerated, transition to independent care as stability in improvment is demonstrated    PT Home Exercise Plan medbridge Access Code: [email or web address] ; [email or web address]   [id number] Home Exercise Program ([id number])    Consulted and Agree with Plan of Care Patient           Patient will benefit from skilled therapeutic intervention in order to improve the following deficits and impairments:  Pain,Decreased activity tolerance,Impaired sensation,Improper body mechanics,Postural dysfunction,Increased muscle spasms,Decreased coordination,Impaired tone,Decreased endurance,Decreased range of motion,Decreased strength,Impaired perceived functional ability,Obesity,Impaired UE functional use  Visit  Diagnosis: Radiculopathy, cervical region  Cervicalgia  Nonintractable headache, unspecified chronicity pattern, unspecified headache type  Pain in thoracic spine     Problem List Patient Active Problem List   Diagnosis Date Noted  . Elevated blood pressure reading in office without diagnosis of hypertension 06/21/2020  . Cervical radiculitis 03/03/2020  . Weakness of left arm 03/03/2020  . Cervicalgia 02/28/2020  . Spondylosis, cervical, 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  . Acute URI 12/04/2019  . Loud snoring 08/01/2019  . B12 deficiency 08/01/2019  . Eczema of left external ear 08/01/2019  . Hyperlipidemia associated with type 2 diabetes mellitus ([place]) 07/31/2019  . Nonallopathic lesion of cervical region 04/29/2019  . Nonallopathic lesion of thoracic region 04/29/2019  . Nonallopathic lesion of rib cage 04/29/2019  . Cervical radiculopathy at C7 03/26/2019  . Abnormal breast exam 01/24/2019  . Allergic rhinitis due to pollen 08/18/2018  . Abnormal mammogram of right breast 02/27/2018  . Type 2 diabetes mellitus without complications ([place]) 97/98/9211  . 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 Encounter for general adult medical examination with abnormal findings 04/29/2016  . Low back pain without sciatica 04/28/2015  . Class 3 severe obesity without serious comorbidity with body mass index (BMI) of 40.0 to 44.9 in adult ([place]) 04/28/2015  . Pre-syncope 07/14/2014    Everlean Alstrom. Graylon Good, PT, DPT 07/28/20, 5:58 PM  [facility] [address], [place], [address] Phone: [phone]   Fax:  [phone]  Name: Lisa [name] MRN: [id number] Date of Birth: June 22, 1975

## 2020-08-04 ENCOUNTER — Ambulatory Visit: Payer: No Typology Code available for payment source | Admitting: Physical Therapy

## 2020-08-11 DIAGNOSIS — M4712 Other spondylosis with myelopathy, cervical region: Secondary | ICD-10-CM

## 2020-08-11 DIAGNOSIS — R29898 Other symptoms and signs involving the musculoskeletal system: Secondary | ICD-10-CM

## 2020-08-12 ENCOUNTER — Encounter: Payer: No Typology Code available for payment source | Admitting: Physical Therapy

## 2020-08-12 ENCOUNTER — Ambulatory Visit: Payer: No Typology Code available for payment source | Admitting: Family Medicine

## 2020-08-17 ENCOUNTER — Encounter: Payer: No Typology Code available for payment source | Admitting: Physical Therapy

## 2020-08-19 ENCOUNTER — Encounter: Payer: No Typology Code available for payment source | Admitting: Physical Therapy

## 2020-08-20 ENCOUNTER — Ambulatory Visit (INDEPENDENT_AMBULATORY_CARE_PROVIDER_SITE_OTHER): Payer: No Typology Code available for payment source

## 2020-08-20 ENCOUNTER — Other Ambulatory Visit: Payer: Self-pay

## 2020-08-20 ENCOUNTER — Ambulatory Visit
Admission: RE | Admit: 2020-08-20 | Discharge: 2020-08-20 | Disposition: A | Discharge status: Home / Self Care | Payer: No Typology Code available for payment source | Source: Ambulatory Visit | Attending: Physician Assistant | Admitting: Physician Assistant

## 2020-08-20 VITALS — BP 125/75 | HR 72 | Temp 97.7°F | Resp 20 | Ht 62.0 in | Wt 285.0 lb

## 2020-08-20 DIAGNOSIS — B349 Viral infection, unspecified: Secondary | ICD-10-CM | POA: Diagnosis not present

## 2020-08-20 DIAGNOSIS — J386 Stenosis of larynx: Secondary | ICD-10-CM | POA: Insufficient documentation

## 2020-08-20 DIAGNOSIS — E669 Obesity, unspecified: Secondary | ICD-10-CM | POA: Insufficient documentation

## 2020-08-20 DIAGNOSIS — Z20822 Contact with and (suspected) exposure to covid-19: Secondary | ICD-10-CM | POA: Diagnosis not present

## 2020-08-20 DIAGNOSIS — R059 Cough, unspecified: Secondary | ICD-10-CM

## 2020-08-20 DIAGNOSIS — R051 Acute cough: Secondary | ICD-10-CM | POA: Diagnosis present

## 2020-08-20 DIAGNOSIS — E785 Hyperlipidemia, unspecified: Secondary | ICD-10-CM | POA: Diagnosis not present

## 2020-08-20 DIAGNOSIS — Z79899 Other long term (current) drug therapy: Secondary | ICD-10-CM | POA: Diagnosis not present

## 2020-08-20 DIAGNOSIS — Z7952 Long term (current) use of systemic steroids: Secondary | ICD-10-CM | POA: Diagnosis not present

## 2020-08-20 DIAGNOSIS — M4722 Other spondylosis with radiculopathy, cervical region: Secondary | ICD-10-CM | POA: Diagnosis not present

## 2020-08-20 DIAGNOSIS — R0602 Shortness of breath: Secondary | ICD-10-CM | POA: Insufficient documentation

## 2020-08-20 DIAGNOSIS — Z6841 Body Mass Index (BMI) 40.0 and over, adult: Secondary | ICD-10-CM | POA: Insufficient documentation

## 2020-08-20 DIAGNOSIS — I10 Essential (primary) hypertension: Secondary | ICD-10-CM | POA: Diagnosis not present

## 2020-08-20 DIAGNOSIS — Z793 Long term (current) use of hormonal contraceptives: Secondary | ICD-10-CM | POA: Insufficient documentation

## 2020-08-20 DIAGNOSIS — J301 Allergic rhinitis due to pollen: Secondary | ICD-10-CM | POA: Diagnosis not present

## 2020-08-20 NOTE — ED Triage Notes (Signed)
Patient in today c/o cough, sob and chest congestion x 1 week. Patient c/o fatigue x 4 days. Patient denies fever. Patient started on Mucinex and Albuterol inhaler x 2 day by her ENT. Patient started Steroid taper on Sunday (08/16/20). Patient has had covid vaccines and booster. Patient did a home covid test on Monday (08/15/20) and it was negative.

## 2020-08-20 NOTE — ED Provider Notes (Signed)
[facility]    CSN: [id number] Arrival date & time: 08/20/20  1550      History   Chief Complaint Chief Complaint  Patient presents with  . Appointment  . Cough  . Shortness of Breath  . chest congestion    HPI Lisa [name] is a 45 y.o. female presenting for 1 week history of productive cough, nasal congestion/chest congestion, and feeling short of breath.  Denies fever.  Has been very fatigued.  Mild runny nose.  No headaches or body aches.  No sinus pain or ear pain. No chest pain. Patient was seen by ENT 2 days ago for these symptoms. Advised she had a viral URI. She has been taking Mucinex and using albuterol for 2 days. Patient started a prednisone taper 4 days ago.  Patient says that her symptoms have improved from 2 days ago but she still does not feel like herself. No sick contacts and no known COVID 19 exposure or flu exposure. Fully vaccinated and boosted for COVID 19. Had a recent negative COVID 19 rapid at home test.  Past medical history significant for subglottic stenosis, obesity, hypertension, hyperlipidemia, cervical radiculopathy, and allergic rhinitis.  PCP: Duncan Dull, MD  HPI  Past Medical History:  Diagnosis Date  . Bronchitis    hx of  . Cervical radiculitis 03/03/2020  . Complication of anesthesia   . DDD (degenerative disc disease), cervical 02/28/2020  . Dysrhythmia    "does not see cardiologist"  . Family history of anesthesia complication    "morphine night terrors"  . GERD (gastroesophageal reflux disease)   . Headache(784.0)    "migraines"  . Hypertension   . PONV (postoperative nausea and vomiting)   . Shortness of breath    "wheezing"  . Weakness of left arm 03/03/2020    Patient Active Problem List   Diagnosis Date Noted  . Elevated blood pressure reading in office without diagnosis of hypertension 06/21/2020  . Cervical radiculitis 03/03/2020  . Weakness of left arm 03/03/2020  . Cervicalgia 02/28/2020  .  Spondylosis, cervical, 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  . Acute URI 12/04/2019  . Loud snoring 08/01/2019  . B12 deficiency 08/01/2019  . Eczema of left external ear 08/01/2019  . Hyperlipidemia associated with type 2 diabetes mellitus (HCC) 07/31/2019  . Nonallopathic lesion of cervical region 04/29/2019  . Nonallopathic lesion of thoracic region 04/29/2019  . Nonallopathic lesion of rib cage 04/29/2019  . Cervical radiculopathy at C7 03/26/2019  . Abnormal breast exam 01/24/2019  . Allergic rhinitis due to pollen 08/18/2018  . Abnormal mammogram of right breast 02/27/2018  . Type 2 diabetes mellitus without complications (HCC) 05/02/2017  . 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 Encounter for general adult medical examination with abnormal findings 04/29/2016  . Low back pain without sciatica 04/28/2015  . Class 3 severe obesity without serious comorbidity with body mass index (BMI) of 40.0 to 44.9 in adult (HCC) 04/28/2015  . Pre-syncope 07/14/2014    Past Surgical History:  Procedure Laterality Date  . CESAREAN SECTION     x 2  . MICROLARYNGOSCOPY WITH DILATION N/A 03/13/2020   Procedure: MICROLARYNGOSCOPY WITH DILATION w/JET VENT MITOMYCIN C;  Surgeon: Christia Reading, MD;  Location: [facility] OR;  Service: ENT;  Laterality: N/A;  . MICROLARYNGOSCOPY WITH LASER AND BALLOON DILATION N/A 07/20/2012   Procedure: MICROLARYNGOSCOPY WITH LASER AND BALLOON DILATION;  Surgeon: Christia Reading, MD;  Location: [facility] OR;  Service: ENT;  Laterality: N/A;  WITH JET VENTURI VENTILATION  . TONGUE  FLAP RELEASE    . widom extractions      OB History   No obstetric history on file.      Home Medications    Prior to Admission medications   Medication Sig Start Date End Date Taking? Authorizing Provider  acetaminophen (TYLENOL) 500 MG tablet Take 1,000 mg by mouth every 6 (six) hours as needed.   Yes [provider]  cetirizine (ZYRTEC) 10 MG tablet Take 10 mg by mouth 2 (two) times daily.    Yes  [provider]  cholecalciferol (VITAMIN D3) 25 MCG (1000 UNIT) tablet Take 1,000 Units by mouth daily.   Yes [provider]  cyanocobalamin (,VITAMIN B-12,) 1000 MCG/ML injection INJECT 1 ML INTO THE MUSCLE ONCE A WEEK FOR 3 WEEKS, THEN MONTHLY THEREAFTER 07/10/20 07/10/21 Yes Sherlene Shams, MD  etonogestrel (NEXPLANON) 68 MG IMPL implant 1 each by Subdermal route once.   Yes [provider]  fluticasone (FLONASE) 50 MCG/ACT nasal spray PLACE 2 SPRAYS INTO BOTH NOSTRILS DAILY. 03/10/20 03/10/21 Yes Sherlene Shams, MD  Misc Natural Products (TART CHERRY ADVANCED PO) Take 1 capsule by mouth daily.   Yes [provider]  omeprazole (PRILOSEC) 40 MG capsule TAKE 1 CAPSULE BY MOUTH DAILY. 04/20/20 04/20/21 Yes Sherlene Shams, MD  predniSONE (DELTASONE) 10 MG tablet TAKE 6 TABS BY MOUTH DAILY FOR 2 DAYS, 5 TABS FOR 2 DAYS, 4 TABS FOR 2 DAYS, 3 TABS FOR 2 DAYS, 2 TABS FOR 2 DAYS, THEN 1 TABLET FOR 2 DAYS 12/13/19 12/12/20 Yes Lamptey, Britta Mccreedy, MD  Turmeric 400 MG CAPS Take 400 mg by mouth daily.    Yes [provider]  cyclobenzaprine (FLEXERIL) 5 MG tablet TAKE 1 TABLET BY MOUTH 3 TIMES DAILY AS NEEDED. Patient taking differently: Take 5 mg by mouth 3 (three) times daily as needed for muscle spasms. 02/14/20 02/13/21  Antoine Primas M, DO  DENAVIR 1 % cream APPLY TOPICALLY EVERY TWO HOURS FOR 4 DAYS AS NEEDED FOR COLD SORES Patient taking differently: Apply 1 application topically See admin instructions. Apply topically every 2 hours for 4 days as needed for cold sores. 03/27/18   Sherlene Shams, MD  gabapentin (NEURONTIN) 100 MG capsule TAKE 2 CAPSULES (200 MG) BY MOUTH AT BEDTIME. 03/10/20 03/10/21  [name], DO  gabapentin (NEURONTIN) 300 MG capsule TAKE 1 CAPSULE BY MOUTH AT BEDTIME. 02/14/20 02/13/21  [name], DO  tiZANidine (ZANAFLEX) 4 MG capsule TAKE 1 CAPSULE (4 MG TOTAL) BY MOUTH AT BEDTIME AS NEEDED FOR MUSCLE SPASMS. 06/19/20 06/19/21   Sherlene Shams, MD  tiZANidine (ZANAFLEX) 4 MG tablet TAKE 1 TABLET BY MOUTH AT BEDTIME. 04/29/20 04/29/21  [name], DO    Family History Family History  Problem Relation Age of Onset  . Hypertension Mother   . Hyperlipidemia Mother   . Arthritis Mother   . Rheum arthritis Mother   . Hypertension Father   . Hyperlipidemia Father   . Breast cancer Neg Hx     Social History Social History   Tobacco Use  . Smoking status: Never Smoker  . Smokeless tobacco: Never Used  Vaping Use  . Vaping Use: Never used  Substance Use Topics  . Alcohol use: Yes    Comment: "social"  . Drug use: No     Allergies   Latex   Review of Systems Review of Systems  Constitutional: Positive for fatigue. Negative for chills, diaphoresis and fever.  HENT: Positive for  congestion and rhinorrhea. Negative for ear pain, sinus pressure, sinus pain and sore throat.   Respiratory: Positive for cough and shortness of breath.   Cardiovascular: Negative for chest pain.  Gastrointestinal: Negative for abdominal pain, nausea and vomiting.  Musculoskeletal: Negative for arthralgias and myalgias.  Skin: Negative for rash.  Neurological: Negative for weakness and headaches.  Hematological: Negative for adenopathy.     Physical Exam Triage Vital Signs ED Triage Vitals  Enc Vitals Group     BP 08/20/20 1613 125/75     Pulse Rate 08/20/20 1613 72     Resp 08/20/20 1613 20     Temp 08/20/20 1613 97.7 F (36.5 C)     Temp Source 08/20/20 1613 Oral     SpO2 08/20/20 1613 99 %     Weight 08/20/20 1615 285 lb (129.3 kg)     Height 08/20/20 1615 5\' 2"  (1.575 m)     Head Circumference --      Peak Flow --      Pain Score 08/20/20 1614 0     Pain Loc --      Pain Edu? --      Excl. in GC? --    No data found.  Updated Vital Signs BP 125/75 (BP Location: Right Arm)   Pulse 72   Temp 97.7 F (36.5 C) (Oral)   Resp 20   Ht 5\' 2"  (1.575 m)   Wt 285 lb (129.3 kg)   SpO2 99%   BMI 52.13  kg/m       Physical Exam Vitals and nursing note reviewed.  Constitutional:      General: She is not in acute distress.    Appearance: Normal appearance. She is well-developed. She is obese. She is ill-appearing. She is not toxic-appearing or diaphoretic.  HENT:     Head: Normocephalic and atraumatic.     Right Ear: Tympanic membrane, ear canal and external ear normal.     Left Ear: Tympanic membrane, ear canal and external ear normal.     Nose: Nose normal. No congestion or rhinorrhea.     Mouth/Throat:     Mouth: Mucous membranes are moist.     Pharynx: Oropharynx is clear.  Eyes:     General: No scleral icterus.       Right eye: No discharge.        Left eye: No discharge.     Conjunctiva/sclera: Conjunctivae normal.  Cardiovascular:     Rate and Rhythm: Normal rate and regular rhythm.     Heart sounds: Normal heart sounds.  Pulmonary:     Effort: Pulmonary effort is normal. No respiratory distress.     Breath sounds: Normal breath sounds. No wheezing, rhonchi or rales.  Musculoskeletal:     Cervical back: Neck supple.  Skin:    General: Skin is dry.  Neurological:     General: No focal deficit present.     Mental Status: She is alert. Mental status is at baseline.     Motor: No weakness.     Gait: Gait normal.  Psychiatric:        Mood and Affect: Mood normal.        Behavior: Behavior normal.        Thought Content: Thought content normal.      UC Treatments / Results  Labs (all labs ordered are listed, but only abnormal results are displayed) Labs Reviewed - No data to display  EKG   Radiology DG Chest 2  View  Result Date: 08/20/2020 CLINICAL DATA:  Cough. EXAM: CHEST - 2 VIEW COMPARISON:  December 13, 2019. FINDINGS: The heart size and mediastinal contours are within normal limits. Both lungs are clear. The visualized skeletal structures are unremarkable. IMPRESSION: No active cardiopulmonary disease. Electronically Signed   By: Lupita RaiderJames  Green Jr M.D.   On:  08/20/2020 16:56    Procedures Procedures (including critical care time)  Medications Ordered in UC Medications - No data to display  Initial Impression / Assessment and Plan / UC Course  I have reviewed the triage vital signs and the nursing notes.  Pertinent labs & imaging results that were available during my care of the patient were reviewed by me and considered in my medical decision making (see chart for details).   45 y/o female presenting for 1 week history of cough and congestion with associated SOB.   All VSS.  She is afebrile.  Her oxygen is 98%.  She is a little ill-appearing, but nontoxic.  Exam without any significant findings.  ENT exam is normal.  Her chest is clear to auscultation heart regular rate and rhythm.  Due to patient complaint of continued symptoms of cough and shortness of breath for 1 week despite taking prednisone using albuterol, obtain a chest x-ray.  Chest x-ray reviewed and independently interpreted by me.  X-ray is without any acute abnormal findings.  Overread confirms this.  Reviewed results with patient.  COVID-19 test obtained.  Current CDC guidelines, isolation protocol and ED precautions reviewed with patient.  Patient questioned possibly needing antibiotics and when they might be considered.  Advised her that there is no indication at this time.  No evidence of a sinus infection, ear infection, strep throat, pneumonia or other bacterial infection.  Advised that her symptoms are consistent with a viral illness and it needs to run its course.  I did offer a different cough syrup and a work note.  She stated she will continue her at home meds.  She did take a work note.  Advised increased rest and fluids.  I did encourage her to follow back up here or in the emergency department for any fever, worsening cough or increased breathing difficulty or any new or worsening symptoms at all.  Patient understanding and agreeable.   Final Clinical Impressions(s) /  UC Diagnoses   Final diagnoses:  Viral illness  Cough  Shortness of breath     Discharge Instructions     VIRAL ILLNESS: The chest x-ray is normal. Your exam today is consistent with a viral illness. Antibiotics are not indicated at this time. Use medications as directed, including cough syrup, nasal saline, and decongestants. Your symptoms should improve over the next few days and resolve within 7-10 days. Increase rest and fluids. F/u if symptoms worsen or predominate such as sore throat, ear pain, productive cough, shortness of breath, or if you develop high fevers or worsening fatigue over the next several days.      ED Prescriptions    None     I have reviewed the PDMP during this encounter.   Shirlee Latchaves, Cortlandt Capuano B, PA-C 08/20/20 1715

## 2020-08-20 NOTE — Discharge Instructions (Signed)
VIRAL ILLNESS: The chest x-ray is normal. Your exam today is consistent with a viral illness. Antibiotics are not indicated at this time. Use medications as directed, including cough syrup, nasal saline, and decongestants. Your symptoms should improve over the next few days and resolve within 7-10 days. Increase rest and fluids. F/u if symptoms worsen or predominate such as sore throat, ear pain, productive cough, shortness of breath, or if you develop high fevers or worsening fatigue over the next several days.

## 2020-08-21 LAB — SARS CORONAVIRUS 2 (TAT 6-24 HRS): SARS Coronavirus 2: NEGATIVE

## 2020-08-24 ENCOUNTER — Encounter: Payer: No Typology Code available for payment source | Admitting: Physical Therapy

## 2020-08-25 ENCOUNTER — Encounter: Payer: Self-pay | Admitting: Physical Therapy

## 2020-08-25 DIAGNOSIS — M546 Pain in thoracic spine: Secondary | ICD-10-CM

## 2020-08-25 DIAGNOSIS — M542 Cervicalgia: Secondary | ICD-10-CM

## 2020-08-25 DIAGNOSIS — M5412 Radiculopathy, cervical region: Secondary | ICD-10-CM

## 2020-08-25 DIAGNOSIS — R519 Headache, unspecified: Secondary | ICD-10-CM

## 2020-08-25 NOTE — Therapy (Signed)
[facility] [address], [place], [address] Phone: [phone]   Fax:  [phone]  Physical Therapy No-Visit Discharge Summary Dates of reporting: 03/04/2020 - 08/25/2020  Patient Details  Name: Lisa [name] MRN: [id number] Date of Birth: Feb 21, 1976 Referring Provider (PT): Molli Barrows, MD (pain clinic)   Encounter Date: 08/25/2020    Past Medical History:  Diagnosis Date  . Bronchitis    hx of  . Cervical radiculitis 03/03/2020  . Complication of anesthesia   . DDD (degenerative disc disease), cervical 02/28/2020  . Dysrhythmia    "does not see cardiologist"  . Family history of anesthesia complication    "morphine night terrors"  . GERD (gastroesophageal reflux disease)   . Headache(784.0)    "migraines"  . Hypertension   . PONV (postoperative nausea and vomiting)   . Shortness of breath    "wheezing"  . Weakness of left arm 03/03/2020    Past Surgical History:  Procedure Laterality Date  . CESAREAN SECTION     x 2  . MICROLARYNGOSCOPY WITH DILATION N/A 03/13/2020   Procedure: MICROLARYNGOSCOPY WITH DILATION w/JET VENT MITOMYCIN C;  Surgeon: Melida Quitter, MD;  Location: [place];  Service: ENT;  Laterality: N/A;  . MICROLARYNGOSCOPY WITH LASER AND BALLOON DILATION N/A 07/20/2012   Procedure: MICROLARYNGOSCOPY WITH LASER AND BALLOON DILATION;  Surgeon: Melida Quitter, MD;  Location: [place];  Service: ENT;  Laterality: N/A;  WITH JET VENTURI VENTILATION  . TONGUE FLAP RELEASE    . widom extractions      There were no vitals filed for this visit.   Subjective Assessment - 08/25/20 1205    Subjective Patient did not return to PT after a trial of more independent management prior to discharge. Now completing discharge documentation as discussed.    Pertinent History Patient is a 45 y.o. female who presents to outpatient physical therapy with a referral for medical diagnosis cervicalgia,  cervical degenerative disc disease. This patient's chief complaints consist of increased neck pain and new left UE pain/weakness/paresthesia and left sided rib pain on top of chronic left neck pain radiating to the left face and headache, leading to the following functional deficits: difficulty using left UE for manual tasks, using the phone, work tasks, holding pressure on wounds (unable), holding things without dropping them, opening packages, pulling back a stopper in a syringe, holding a cup, taking lids off, grooming, folding laundry, making the bed, anything that requires two hands and force, gardening, yardwork, raking, clipping, cutting, dressing, showering, sleeping. Relevant past medical history and comorbidities include diabetes (currently controlled), idiopathic subglottic tracheal stenosis (surgery coming up end of month, causes shortness of breath upon exertion), low back pain on left side, obesity, GERD, when she takes caffeine she gest palpitations, high blood pressure.  Patient denies hx of cancer, stroke, seizures, lung problem (besides trachea problem), major cardiac events, unexplained weight loss, changes in bowel or bladder problems, new onset stumbling or dropping things apart from described below.    Limitations House hold activities;Other (comment);Lifting   usinghands and force, gardening, yardwork, raking, clipping, cutting, dressing, showering, sleeping.   Diagnostic tests C-spine MRI report 02/20/2020: "IMPRESSION:1. Minimal disc osteophyte complex with left uncovertebral spurringat C5-C6 resulting in mild left foraminal stenosis.2. No canal stenosis at any level."    Patient Stated Goals "to not have arm pain"           OBJECTIVE Patient is not present for examination at this time.  Please see previous documentation for latest objective data.     PT Long Term Goals - 08/25/20 1206      PT LONG TERM GOAL #1   Title Be independent with a long-term home exercise program for  self-management of symptoms.    Baseline initial HEP provided at IE (03/04/2020); currently participating in appropriate HEP (04/27/2020); continues to participate in appropriate HEP (05/28/2020);    Time 12    Period Weeks    Status Achieved   TARGET DATE FOR ALL LONG TERM GOALS: 05/27/2020. TARGET DATE FOR UNMET GOALS UPDATED TO 08/20/2020     PT LONG TERM GOAL #2   Title Demonstrate improved FOTO score by 10 units to demonstrate improvement in overall condition and self-reported functional ability.    Baseline deferred to visit 2 (03/04/2020); 54 at visit 2 (03/10/2020); 69 at visit 10 (04/27/2020); 61 at visit 17 (05/28/2020); 74 at visit  25 (07/28/20);    Time 12    Period Weeks    Status Achieved      PT LONG TERM GOAL #3   Title Improve left UE  strength to equal or greater than  4+/5 for improved ability to allow patient to complete valued functional tasks such as lifting, pressing, grasping, holding, etc,  with less difficulty.    Baseline wrist extension 3/5 and reports weakness with repetition (03/05/2020); nearly met - lacks L wrist flexion (04/27/2020); met except left wrist extension (05/28/2020); met (06/16/2020);    Time 12    Period Weeks    Status Achieved      PT LONG TERM GOAL #4   Title Patient will report no longer having pain down the left arm to improve ability to use left UE for functional tasks such as grasping, pressing, stabilizing.    Baseline 8/10 (03/05/2020); 4/10 highest in the last two weeks (04/27/2020); 3/10 highest in the last two weeks (05/28/2020); occasional paresthesia and discomfort (07/28/2020);    Time 12    Period Weeks    Status Partially Met      PT LONG TERM GOAL #5   Title Complete community, work and/or recreational activities without limitation due to current condition.    Baseline Functional Limitations: using left UE for manual tasks, using the phone, work tasks, holding pressure on wounds (unable), holding things without dropping them, opening  packages, pulling back a stopper in a syringe, holding a cup, taking lids off, grooming, folding laundry, making the bed, anything that requires two hands and force, gardening, yardwork, raking, clipping, cutting, dressing, showering, sleeping (03/04/2020); improved with all task but still gets tired early in the left UE and switches hands if able - also still cannot button bra and has difficulty putting on a jacket (04/27/2020); continues to improve but still has difficulty with jacket, holding pots/pans (05/28/2020); minimal functional limitations (07/28/2020);    Time 12    Period Weeks    Status Achieved             Plan - 08/25/20 1213    Clinical Impression Statement Patient completed 25 physical therapy sessions this episode of care and made good progress, meeting or nearly meeting all goals. Patient reported FOTO score improved to 74, demonstrating good improvement in self-reported function. She reported minimal pain/paresthesia, and good return to functional activities. Patient is now discharged due to successful transition to independent long term management with HEP.    Personal Factors and Comorbidities Age;Comorbidity 3+;Past/Current Experience;Fitness;Time since onset of injury/illness/exacerbation;Profession;Education    Comorbidities Relevant  past medical history and comorbidities include diabetes (currently controlled), idiopathic subglottic tracheal stenosis (surgery coming up end of month, causes shortness of breath upon exertion), low back pain on left side, obesity, GERD, when she takes caffeine she gest palpitations, high blood pressure.    Examination-Activity Limitations Bathing;Hygiene/Grooming;Lift;Caring for Others;Reach Overhead;Carry;Dressing;Sleep    Examination-Participation Restrictions Community Activity;Occupation;Yard Work;Meal Prep;Laundry;Cleaning;Interpersonal Relationship;Other   using left UE for manual tasks, using the phone, holding pressure on wounds (unable),  holding things without dropping them, opening packages, pulling back a stopper in a syringe, holding a cup, taking lids off, making the bed   Stability/Clinical Decision Making Evolving/Moderate complexity    Rehab Potential Good    PT Frequency 2x / week    PT Duration 12 weeks    PT Treatment/Interventions Moist Heat;Therapeutic exercise;Manual techniques;Dry needling;Electrical Stimulation;ADLs/Self Care Home Management;Cryotherapy;Traction;Therapeutic activities;Neuromuscular re-education;Patient/family education;Passive range of motion;Taping;Joint Manipulations;Spinal Manipulations    PT Next Visit Plan Patient is now discharged due to successful transition to independent long term management with HEP.    PT Home Exercise Plan [email or web address] Access Code: [email or web address] ; [email or web address]   [id number] Home Exercise Program ([id number])    Consulted and Agree with Plan of Care Patient           Patient will benefit from skilled therapeutic intervention in order to improve the following deficits and impairments:  Pain,Decreased activity tolerance,Impaired sensation,Improper body mechanics,Postural dysfunction,Increased muscle spasms,Decreased coordination,Impaired tone,Decreased endurance,Decreased range of motion,Decreased strength,Impaired perceived functional ability,Obesity,Impaired UE functional use  Visit Diagnosis: Radiculopathy, cervical region  Cervicalgia  Nonintractable headache, unspecified chronicity pattern, unspecified headache type  Pain in thoracic spine     Problem List Patient Active Problem List   Diagnosis Date Noted  . Elevated blood pressure reading in office without diagnosis of hypertension 06/21/2020  . Cervical radiculitis 03/03/2020  . Weakness of left arm 03/03/2020  . Cervicalgia 02/28/2020  . Spondylosis, cervical, 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  . Acute URI 12/04/2019  . Loud snoring 08/01/2019  . B12 deficiency 08/01/2019  . Eczema of left external ear 08/01/2019  .  Hyperlipidemia associated with type 2 diabetes mellitus ([place]) 07/31/2019  . Nonallopathic lesion of cervical region 04/29/2019  . Nonallopathic lesion of thoracic region 04/29/2019  . Nonallopathic lesion of rib cage 04/29/2019  . Cervical radiculopathy at C7 03/26/2019  . Abnormal breast exam 01/24/2019  . Allergic rhinitis due to pollen 08/18/2018  . Abnormal mammogram of right breast 02/27/2018  . Type 2 diabetes mellitus without complications ([place]) 46/65/9935  . 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 Encounter for general adult medical examination with abnormal findings 04/29/2016  . Low back pain without sciatica 04/28/2015  . Class 3 severe obesity without serious comorbidity with body mass index (BMI) of 40.0 to 44.9 in adult ([place]) 04/28/2015  . Pre-syncope 07/14/2014    Everlean Alstrom. Graylon Good, PT, DPT 08/25/20, 12:13 PM  [facility] [address], [place], [address] Phone: [phone]   Fax:  [phone]  Name: ZAYLEI MULLANE MRN: [id number] Date of Birth: [birth year]

## 2020-08-26 ENCOUNTER — Encounter: Payer: No Typology Code available for payment source | Admitting: Physical Therapy

## 2020-09-11 ENCOUNTER — Other Ambulatory Visit: Payer: Self-pay

## 2020-09-11 MED FILL — Fluticasone Propionate Nasal Susp 50 MCG/ACT: NASAL | 90 days supply | Qty: 48 | Fill #0 | Status: CN

## 2020-09-16 ENCOUNTER — Ambulatory Visit: Payer: No Typology Code available for payment source | Admitting: Family Medicine

## 2020-09-22 ENCOUNTER — Other Ambulatory Visit: Payer: Self-pay

## 2020-10-14 DIAGNOSIS — U071 COVID-19: Secondary | ICD-10-CM

## 2020-10-14 HISTORY — DX: COVID-19: U07.1

## 2020-10-21 ENCOUNTER — Other Ambulatory Visit: Payer: Self-pay | Admitting: Internal Medicine

## 2020-10-22 ENCOUNTER — Other Ambulatory Visit: Payer: Self-pay

## 2020-10-22 MED ORDER — OMEPRAZOLE 40 MG PO CPDR
DELAYED_RELEASE_CAPSULE | Freq: Every day | ORAL | 1 refills | Status: DC
  Filled 2020-10-22: qty 90, 90d supply, fill #0
  Filled 2021-01-16: qty 90, 90d supply, fill #1

## 2020-11-04 ENCOUNTER — Other Ambulatory Visit: Payer: Self-pay

## 2020-11-04 MED ORDER — PREDNISONE 5 MG PO TABS
ORAL_TABLET | ORAL | 0 refills | Status: DC
  Filled 2020-11-04: qty 21, 6d supply, fill #0

## 2020-11-09 ENCOUNTER — Other Ambulatory Visit: Payer: Self-pay

## 2020-11-09 ENCOUNTER — Telehealth: Payer: Self-pay | Admitting: Internal Medicine

## 2020-11-09 MED ORDER — NYSTATIN 100000 UNIT/ML MT SUSP
OROMUCOSAL | 0 refills | Status: DC
  Filled 2020-11-09: qty 200, 5d supply, fill #0

## 2020-11-09 NOTE — Telephone Encounter (Signed)
Pt has been advised to go to urgent care. Pt gave a verbal understanding.

## 2020-11-09 NOTE — Telephone Encounter (Signed)
Noted. Awaiting Access Nurse note.

## 2020-11-09 NOTE — Telephone Encounter (Signed)
LMTCB

## 2020-11-09 NOTE — Telephone Encounter (Signed)
PT transfer to Access Nurse  PT called to see if could be seen virtually. Advise nothing available and she stated she has had covid like symptoms for almost a week. PT has fatigue coughing,sores in mouth, sneezing and blowing nose. Transfer to Access Nurse and advise nothing available.

## 2020-11-10 ENCOUNTER — Other Ambulatory Visit: Payer: Self-pay

## 2020-11-11 ENCOUNTER — Other Ambulatory Visit: Payer: Self-pay

## 2020-11-12 ENCOUNTER — Ambulatory Visit (INDEPENDENT_AMBULATORY_CARE_PROVIDER_SITE_OTHER): Payer: No Typology Code available for payment source

## 2020-11-12 ENCOUNTER — Ambulatory Visit
Admission: RE | Admit: 2020-11-12 | Discharge: 2020-11-12 | Disposition: A | Discharge status: Home / Self Care | Payer: No Typology Code available for payment source | Source: Ambulatory Visit | Attending: Emergency Medicine | Admitting: Emergency Medicine

## 2020-11-12 ENCOUNTER — Other Ambulatory Visit: Payer: Self-pay

## 2020-11-12 ENCOUNTER — Telehealth: Payer: Self-pay

## 2020-11-12 VITALS — BP 129/85 | HR 92 | Temp 98.2°F | Resp 22

## 2020-11-12 DIAGNOSIS — R059 Cough, unspecified: Secondary | ICD-10-CM | POA: Diagnosis not present

## 2020-11-12 DIAGNOSIS — J0100 Acute maxillary sinusitis, unspecified: Secondary | ICD-10-CM

## 2020-11-12 DIAGNOSIS — U071 COVID-19: Secondary | ICD-10-CM

## 2020-11-12 DIAGNOSIS — R0602 Shortness of breath: Secondary | ICD-10-CM

## 2020-11-12 MED ORDER — AMOXICILLIN 875 MG PO TABS
875.0000 mg | ORAL_TABLET | Freq: Two times a day (BID) | ORAL | 0 refills | Status: AC
  Filled 2020-11-12: qty 14, 7d supply, fill #0

## 2020-11-12 NOTE — Telephone Encounter (Signed)
Pt went to UC yesterday and was prescribed an antibiotic.

## 2020-11-12 NOTE — Discharge Instructions (Addendum)
Your chest xray is normal.    Take the amoxicillin as directed.     Follow up with your primary care provider if your symptoms are not improving.

## 2020-11-12 NOTE — Telephone Encounter (Signed)
Spoke with pt to let her know that she will have to have [employer] fax [place] the forms to have completed. Pt gave a verbal understanding.   Pt also wanted to know if she is still considered contagious. She stated that today is day 10 from when she tested positive but is still having symptoms of coughing and extreme fatigue.

## 2020-11-12 NOTE — ED Provider Notes (Signed)
Renaldo Fiddler    CSN: [id number] Arrival date & time: 11/12/20  1141      History   Chief Complaint Chief Complaint  Patient presents with  . Cough  . sneezing  . Fatigue     HPI Lisa [name] is a 45 y.o. female.  Patient presents with low-grade fever, fatigue, nasal congestion, headache, cough, mild shortness of breath with activity x 10 days.  T-max 99.9.  She reports mild diarrhea x4 days.  She denies rash, vomiting, or other symptoms.  Symptom onset 11/02/2020; tested positive for COVID at home on 11/03/2020.  Several OTC treatments attempted at home.  Her medical history includes bronchitis, shortness of breath, hypertension, obesity, low back pain, cervical radiculopathy, cervical spondylosis.  The history is provided by the patient and medical records.   Past Medical History:  Diagnosis Date  . Bronchitis    hx of  . Cervical radiculitis 03/03/2020  . Complication of anesthesia   . COVID-19 10/2020  . DDD (degenerative disc disease), cervical 02/28/2020  . Dysrhythmia    "does not see cardiologist"  . Family history of anesthesia complication    "morphine night terrors"  . GERD (gastroesophageal reflux disease)   . Headache(784.0)    "migraines"  . Hypertension   . PONV (postoperative nausea and vomiting)   . Shortness of breath    "wheezing"  . Weakness of left arm 03/03/2020    Patient Active Problem List   Diagnosis Date Noted  . Elevated blood pressure reading in office without diagnosis of hypertension 06/21/2020  . Cervical radiculitis 03/03/2020  . Weakness of left arm 03/03/2020  . Cervicalgia 02/28/2020  . Spondylosis, cervical, 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  . Acute URI 12/04/2019  . Loud snoring 08/01/2019  . B12 deficiency 08/01/2019  . Eczema of left external ear 08/01/2019  . Hyperlipidemia associated with type 2 diabetes mellitus (HCC) 07/31/2019  . Nonallopathic lesion of cervical region 04/29/2019  . Nonallopathic lesion of  thoracic region 04/29/2019  . Nonallopathic lesion of rib cage 04/29/2019  . Cervical radiculopathy at C7 03/26/2019  . Abnormal breast exam 01/24/2019  . Allergic rhinitis due to pollen 08/18/2018  . Abnormal mammogram of right breast 02/27/2018  . Type 2 diabetes mellitus without complications (HCC) 05/02/2017  . 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 Encounter for general adult medical examination with abnormal findings 04/29/2016  . Low back pain without sciatica 04/28/2015  . Class 3 severe obesity without serious comorbidity with body mass index (BMI) of 40.0 to 44.9 in adult (HCC) 04/28/2015  . Pre-syncope 07/14/2014    Past Surgical History:  Procedure Laterality Date  . CESAREAN SECTION     x 2  . MICROLARYNGOSCOPY WITH DILATION N/A 03/13/2020   Procedure: MICROLARYNGOSCOPY WITH DILATION w/JET VENT MITOMYCIN C;  Surgeon: Christia Reading, MD;  Location: [facility] OR;  Service: ENT;  Laterality: N/A;  . MICROLARYNGOSCOPY WITH LASER AND BALLOON DILATION N/A 07/20/2012   Procedure: MICROLARYNGOSCOPY WITH LASER AND BALLOON DILATION;  Surgeon: Christia Reading, MD;  Location: [facility] OR;  Service: ENT;  Laterality: N/A;  WITH JET VENTURI VENTILATION  . TONGUE FLAP RELEASE    . widom extractions      OB History   No obstetric history on file.      Home Medications    Prior to Admission medications   Medication Sig Start Date End Date Taking? Authorizing Provider  amoxicillin (AMOXIL) 875 MG tablet Take 1 tablet (875 mg total) by mouth 2 (two) times daily for  7 days. 11/12/20 11/19/20 Yes Mickie Bail, NP  acetaminophen (TYLENOL) 500 MG tablet Take 1,000 mg by mouth every 6 (six) hours as needed.    [provider]  cetirizine (ZYRTEC) 10 MG tablet Take 10 mg by mouth 2 (two) times daily.     [provider]  cholecalciferol (VITAMIN D3) 25 MCG (1000 UNIT) tablet Take 1,000 Units by mouth daily.    [provider]  cyanocobalamin (,VITAMIN B-12,) 1000 MCG/ML injection INJECT  1 ML INTO THE MUSCLE ONCE A WEEK FOR 3 WEEKS, THEN MONTHLY THEREAFTER 07/10/20 07/10/21  Sherlene Shams, MD  cyclobenzaprine (FLEXERIL) 5 MG tablet TAKE 1 TABLET BY MOUTH 3 TIMES DAILY AS NEEDED. Patient taking differently: Take 5 mg by mouth 3 (three) times daily as needed for muscle spasms. 02/14/20 02/13/21  Antoine Primas M, DO  DENAVIR 1 % cream APPLY TOPICALLY EVERY TWO HOURS FOR 4 DAYS AS NEEDED FOR COLD SORES Patient taking differently: Apply 1 application topically See admin instructions. Apply topically every 2 hours for 4 days as needed for cold sores. 03/27/18   Sherlene Shams, MD  etonogestrel (NEXPLANON) 68 MG IMPL implant 1 each by Subdermal route once.    [provider]  fluticasone (FLONASE) 50 MCG/ACT nasal spray PLACE 2 SPRAYS INTO BOTH NOSTRILS DAILY. 03/10/20 03/10/21  Sherlene Shams, MD  gabapentin (NEURONTIN) 100 MG capsule TAKE 2 CAPSULES (200 MG) BY MOUTH AT BEDTIME. 03/10/20 03/10/21  [name], DO  gabapentin (NEURONTIN) 300 MG capsule TAKE 1 CAPSULE BY MOUTH AT BEDTIME. 02/14/20 02/13/21  [name], DO  lidocaine-nystatin-diphenhydrAMINE-hydrocortisone swish and spit 1-2 teaspoonfuls by mouth 3 to 4 times a day as needed for mouth sores 11/09/20   Georgiana Spinner, NP  Misc Natural Products (TART CHERRY ADVANCED PO) Take 1 capsule by mouth daily.    [provider]  omeprazole (PRILOSEC) 40 MG capsule TAKE 1 CAPSULE BY MOUTH DAILY. 10/22/20 10/22/21  Sherlene Shams, MD  predniSONE (DELTASONE) 10 MG tablet TAKE 6 TABS BY MOUTH DAILY FOR 2 DAYS, 5 TABS FOR 2 DAYS, 4 TABS FOR 2 DAYS, 3 TABS FOR 2 DAYS, 2 TABS FOR 2 DAYS, THEN 1 TABLET FOR 2 DAYS 12/13/19 12/12/20  Merrilee Jansky, MD  predniSONE (DELTASONE) 5 MG tablet take 6 tablets by mouth for day 1, then decrease by one tablet daily until gone 11/04/20   Georgiana Spinner, NP  tiZANidine (ZANAFLEX) 4 MG capsule TAKE 1 CAPSULE (4 MG TOTAL) BY MOUTH AT BEDTIME AS NEEDED FOR MUSCLE SPASMS. 06/19/20 06/19/21   Sherlene Shams, MD  tiZANidine (ZANAFLEX) 4 MG tablet TAKE 1 TABLET BY MOUTH AT BEDTIME. 04/29/20 04/29/21  [name], DO  Turmeric 400 MG CAPS Take 400 mg by mouth daily.     [provider]    Family History Family History  Problem Relation Age of Onset  . Hypertension Mother   . Hyperlipidemia Mother   . Arthritis Mother   . Rheum arthritis Mother   . Hypertension Father   . Hyperlipidemia Father   . Breast cancer Neg Hx     Social History Social History   Tobacco Use  . Smoking status: Never  . Smokeless tobacco: Never  Vaping Use  . Vaping Use: Never used  Substance Use Topics  . Alcohol use: Yes    Comment: "social"  . Drug use: No     Allergies   Latex   Review of Systems Review of  Systems  Constitutional:  Positive for fatigue and fever. Negative for chills.  HENT:  Positive for congestion. Negative for ear pain and sore throat.   Eyes:  Negative for pain and visual disturbance.  Respiratory:  Positive for cough and shortness of breath.   Cardiovascular:  Negative for chest pain and palpitations.  Gastrointestinal:  Positive for diarrhea. Negative for abdominal pain and vomiting.  Genitourinary:  Negative for dysuria and hematuria.  Musculoskeletal:  Negative for arthralgias and back pain.  Skin:  Negative for color change and rash.  Neurological:  Positive for headaches. Negative for syncope, weakness and numbness.  All other systems reviewed and are negative.   Physical Exam Triage Vital Signs ED Triage Vitals  Enc Vitals Group     BP      Pulse      Resp      Temp      Temp src      SpO2      Weight      Height      Head Circumference      Peak Flow      Pain Score      Pain Loc      Pain Edu?      Excl. in GC?    No data found.  Updated Vital Signs BP 129/85 (BP Location: Right Arm)   Pulse 92   Temp 98.2 F (36.8 C) (Oral)   Resp (!) 22   SpO2 98%   Visual Acuity Right Eye Distance:   Left Eye Distance:    Bilateral Distance:    Right Eye Near:   Left Eye Near:    Bilateral Near:     Physical Exam Vitals and nursing note reviewed.  Constitutional:      General: She is not in acute distress.    Appearance: She is well-developed. She is obese. She is ill-appearing.  HENT:     Head: Normocephalic and atraumatic.     Right Ear: Tympanic membrane normal.     Left Ear: Tympanic membrane normal.     Nose: Nose normal.     Mouth/Throat:     Mouth: Mucous membranes are moist.     Pharynx: Oropharynx is clear.  Eyes:     Conjunctiva/sclera: Conjunctivae normal.  Cardiovascular:     Rate and Rhythm: Normal rate and regular rhythm.     Heart sounds: Normal heart sounds.  Pulmonary:     Effort: Pulmonary effort is normal. No respiratory distress.     Breath sounds: Normal breath sounds.  Abdominal:     Palpations: Abdomen is soft.     Tenderness: There is no abdominal tenderness.  Musculoskeletal:     Cervical back: Neck supple.  Skin:    General: Skin is warm and dry.  Neurological:     General: No focal deficit present.     Mental Status: She is alert and oriented to person, place, and time.     Gait: Gait normal.  Psychiatric:        Mood and Affect: Mood normal.        Behavior: Behavior normal.     UC Treatments / Results  Labs (all labs ordered are listed, but only abnormal results are displayed) Labs Reviewed - No data to display  EKG   Radiology DG Chest 2 View  Result Date: 11/12/2020 CLINICAL DATA:  Recent COVID-19 positive. Cough and shortness of breath EXAM: CHEST - 2 VIEW COMPARISON:  August 20, 2020 FINDINGS:  Lungs are clear. Heart size and pulmonary vascularity are normal. No adenopathy. No bone lesions. IMPRESSION: Lungs clear.  Cardiac silhouette normal. Electronically Signed   By: Bretta BangWilliam  Woodruff III M.D.   On: 11/12/2020 12:55    Procedures Procedures (including critical care time)  Medications Ordered in UC Medications - No data to  display  Initial Impression / Assessment and Plan / UC Course  I have reviewed the triage vital signs and the nursing notes.  Pertinent labs & imaging results that were available during my care of the patient were reviewed by me and considered in my medical decision making (see chart for details).  COVID-19, cough, shortness of breath, sinusitis.  Chest x-ray normal.  O2 sat 98% on room air.  Lungs are clear.  Treating with amoxicillin.  ED precautions discussed.  Instructed patient to follow-up with her PCP if her symptoms are not improving.  She agrees to plan of care.   Final Clinical Impressions(s) / UC Diagnoses   Final diagnoses:  COVID-19  Cough  SOB (shortness of breath)  Acute non-recurrent maxillary sinusitis     Discharge Instructions      Your chest xray is normal.    Take the amoxicillin as directed.     Follow up with your primary care provider if your symptoms are not improving.         ED Prescriptions     Medication Sig Dispense Auth. Provider   amoxicillin (AMOXIL) 875 MG tablet Take 1 tablet (875 mg total) by mouth 2 (two) times daily for 7 days. 14 tablet Mickie Bailate, Alantra Popoca H, NP      PDMP not reviewed this encounter.   Mickie Bailate, Charle Clear H, NP 11/12/20 [phone]

## 2020-11-12 NOTE — Telephone Encounter (Signed)
Pt called and states that [place] at work advised her to call Dr Darrick Huntsman to start LOA paperwork for covid. Please call pt back

## 2020-11-12 NOTE — ED Triage Notes (Signed)
Pt presents today with c/o fatigue, SOB with activity, cough, nasal congestion/sneezing, headache, +low grade fever.   She tested Postive for Covid on home test 11/03/20.

## 2020-11-13 DIAGNOSIS — Z0279 Encounter for issue of other medical certificate: Secondary | ICD-10-CM

## 2020-11-13 NOTE — Telephone Encounter (Signed)
Sent mychart message letting pt know 

## 2020-11-20 NOTE — Telephone Encounter (Signed)
Pt called to follow up on LOA paperwork

## 2020-11-23 ENCOUNTER — Telehealth: Payer: Self-pay | Admitting: Internal Medicine

## 2020-11-23 NOTE — Telephone Encounter (Signed)
Pt called to follow up on  FMLA

## 2020-11-23 NOTE — Telephone Encounter (Signed)
PT called to advise that she would like to speak to Dr.Tullo assistant in regards to return to work paperwork.

## 2020-11-24 ENCOUNTER — Telehealth: Payer: Self-pay | Admitting: Internal Medicine

## 2020-11-24 DIAGNOSIS — Z0279 Encounter for issue of other medical certificate: Secondary | ICD-10-CM

## 2020-11-24 NOTE — Telephone Encounter (Signed)
Pt is aware forms have been faxed and placed up for pick up. Copy made for chart and charging.

## 2020-11-24 NOTE — Telephone Encounter (Signed)
Patient dropped leave of absence and return to work form.Paperwork is upfront in Dr.Tullo's colored folder.

## 2020-11-24 NOTE — Telephone Encounter (Signed)
Pt also sent a mychart message and I have responded to that.

## 2020-11-24 NOTE — Telephone Encounter (Signed)
Forms have been placed in red folder.  

## 2020-12-16 NOTE — Telephone Encounter (Signed)
Pt called an said that the forms haven't been received via fax and would just like them mailed

## 2020-12-22 NOTE — Telephone Encounter (Signed)
Spoke with pt and she stated that she came by the office this morning and picked up the copies that she needed.

## 2020-12-31 ENCOUNTER — Other Ambulatory Visit: Payer: Self-pay | Admitting: Internal Medicine

## 2020-12-31 DIAGNOSIS — Z1231 Encounter for screening mammogram for malignant neoplasm of breast: Secondary | ICD-10-CM

## 2021-01-07 ENCOUNTER — Other Ambulatory Visit: Payer: Self-pay

## 2021-01-07 ENCOUNTER — Encounter: Payer: Self-pay | Admitting: Internal Medicine

## 2021-01-07 ENCOUNTER — Ambulatory Visit (INDEPENDENT_AMBULATORY_CARE_PROVIDER_SITE_OTHER): Payer: No Typology Code available for payment source | Admitting: Internal Medicine

## 2021-01-07 VITALS — BP 110/76 | HR 82 | Temp 95.8°F | Resp 15 | Ht 62.0 in | Wt 287.4 lb

## 2021-01-07 DIAGNOSIS — Z23 Encounter for immunization: Secondary | ICD-10-CM | POA: Diagnosis not present

## 2021-01-07 DIAGNOSIS — E1169 Type 2 diabetes mellitus with other specified complication: Secondary | ICD-10-CM

## 2021-01-07 DIAGNOSIS — E538 Deficiency of other specified B group vitamins: Secondary | ICD-10-CM | POA: Diagnosis not present

## 2021-01-07 DIAGNOSIS — E119 Type 2 diabetes mellitus without complications: Secondary | ICD-10-CM

## 2021-01-07 DIAGNOSIS — E785 Hyperlipidemia, unspecified: Secondary | ICD-10-CM | POA: Diagnosis not present

## 2021-01-07 DIAGNOSIS — M5412 Radiculopathy, cervical region: Secondary | ICD-10-CM | POA: Diagnosis not present

## 2021-01-07 DIAGNOSIS — R5383 Other fatigue: Secondary | ICD-10-CM

## 2021-01-07 DIAGNOSIS — N6459 Other signs and symptoms in breast: Secondary | ICD-10-CM

## 2021-01-07 DIAGNOSIS — Z6841 Body Mass Index (BMI) 40.0 and over, adult: Secondary | ICD-10-CM

## 2021-01-07 DIAGNOSIS — R0683 Snoring: Secondary | ICD-10-CM

## 2021-01-07 LAB — CBC WITH DIFFERENTIAL/PLATELET
Basophils Absolute: 0.1 10*3/uL (ref 0.0–0.1)
Basophils Relative: 0.9 % (ref 0.0–3.0)
Eosinophils Absolute: 0.1 10*3/uL (ref 0.0–0.7)
Eosinophils Relative: 1.2 % (ref 0.0–5.0)
HCT: 42.6 % (ref 36.0–46.0)
Hemoglobin: 13.9 g/dL (ref 12.0–15.0)
Lymphocytes Relative: 20.7 % (ref 12.0–46.0)
Lymphs Abs: 1.4 10*3/uL (ref 0.7–4.0)
MCHC: 32.7 g/dL (ref 30.0–36.0)
MCV: 84.7 fl (ref 78.0–100.0)
Monocytes Absolute: 0.3 10*3/uL (ref 0.1–1.0)
Monocytes Relative: 4.9 % (ref 3.0–12.0)
Neutro Abs: 4.7 10*3/uL (ref 1.4–7.7)
Neutrophils Relative %: 72.3 % (ref 43.0–77.0)
Platelets: 278 10*3/uL (ref 150.0–400.0)
RBC: 5.04 Mil/uL (ref 3.87–5.11)
RDW: 13.8 % (ref 11.5–15.5)
WBC: 6.5 10*3/uL (ref 4.0–10.5)

## 2021-01-07 LAB — COMPREHENSIVE METABOLIC PANEL
ALT: 49 U/L — ABNORMAL HIGH (ref 0–35)
AST: 32 U/L (ref 0–37)
Albumin: 4.2 g/dL (ref 3.5–5.2)
Alkaline Phosphatase: 55 U/L (ref 39–117)
BUN: 13 mg/dL (ref 6–23)
CO2: 27 mEq/L (ref 19–32)
Calcium: 9.7 mg/dL (ref 8.4–10.5)
Chloride: 104 mEq/L (ref 96–112)
Creatinine, Ser: 0.76 mg/dL (ref 0.40–1.20)
GFR: 95.58 mL/min (ref >60.00)
Glucose, Bld: 111 mg/dL — ABNORMAL HIGH (ref 70–99)
Potassium: 4.4 mEq/L (ref 3.5–5.1)
Sodium: 139 mEq/L (ref 135–145)
Total Bilirubin: 0.8 mg/dL (ref 0.2–1.2)
Total Protein: 6.7 g/dL (ref 6.0–8.3)

## 2021-01-07 LAB — LDL CHOLESTEROL, DIRECT: Direct LDL: 114 mg/dL

## 2021-01-07 LAB — LIPID PANEL
Cholesterol: 184 mg/dL (ref 0–200)
HDL: 37.9 mg/dL — ABNORMAL LOW (ref >39.00)
NonHDL: 146.35
Total CHOL/HDL Ratio: 5
Triglycerides: 242 mg/dL — ABNORMAL HIGH (ref 0.0–149.0)
VLDL: 48.4 mg/dL — ABNORMAL HIGH (ref 0.0–40.0)

## 2021-01-07 LAB — TSH: TSH: 2.24 u[IU]/mL (ref 0.35–5.50)

## 2021-01-07 LAB — VITAMIN B12: Vitamin B-12: 310 pg/mL (ref 211–911)

## 2021-01-07 LAB — HEMOGLOBIN A1C: Hgb A1c MFr Bld: 6.9 % — ABNORMAL HIGH (ref 4.6–6.5)

## 2021-01-07 MED ORDER — SEMAGLUTIDE (2 MG/DOSE) 8 MG/3ML ~~LOC~~ SOPN
0.2500 mg | PEN_INJECTOR | SUBCUTANEOUS | 0 refills | Status: DC
  Filled 2021-01-07: qty 3, fill #0
  Filled 2021-06-21: qty 3, 28d supply, fill #0

## 2021-01-07 MED ORDER — PEN NEEDLES 32G X 4 MM MISC
0 refills | Status: DC
  Filled 2021-01-07: qty 30, fill #0
  Filled 2021-02-01 (×2): qty 100, 90d supply, fill #0

## 2021-01-07 NOTE — Patient Instructions (Signed)
I am recommending an injectable medication to help curb your appetite,  It is called Ozempic.  It slows down your stomach emptying so you stay full longer .  The starting dose is 0.25 mg (ten clicks from zero using the pen sample given to you today ).  One injection  per week     You may have mild nausea on the first or second day but this should resolve.  If not  ,  stop the medication.   As long as you are losing weight,  you can continue the dose you are on .  Only increase the dose to 0.5 mg after 4 weeks if your weight has plateaued.  Let me know when you need a refill and what dose you are taking.    You received the Prevnar 10 pneumonia vaccine today  DIABETIC EYE EXAM NEEDED MONTHLY

## 2021-01-07 NOTE — Assessment & Plan Note (Signed)
diagnostic mammogram normal  Resuming annual screening in September

## 2021-01-07 NOTE — Progress Notes (Signed)
Subjective:  Patient ID: Charlann LangeMichelle K Casella, female    DOB: 01-Feb-1976  Age: 45 y.o. MRN: [id number]  CC: The primary encounter diagnosis was Cervical radiculopathy at C7. Diagnoses of Fatigue, unspecified type, B12 deficiency, Hyperlipidemia associated with type 2 diabetes mellitus (HCC), Abnormal breast exam, Class 3 severe obesity without serious comorbidity with body mass index (BMI) of 40.0 to 44.9 in adult, unspecified obesity type (HCC), Need for vaccination, Type 2 diabetes mellitus without complication, without long-term current use of insulin (HCC), and Loud snoring were also pertinent to this visit.  HPI Charlann LangeMichelle K Kirkwood presents for FOLLOW UP ON TYPE 2 DM WITH OBESITY AND  HYPERLIPIDEMIA   This visit occurred during the SARS-CoV-2 public health emergency.  Safety protocols were in place, including screening questions prior to the visit, additional usage of staff PPE, and extensive cleaning of exam room while observing appropriate contact time as indicated for disinfecting solutions.   Dm/obesity:  down 5 lbs since  February , achieved by trying to have smaller meals . Diet reviewed:  Not low carb diet.   DIET IS Heavy in carbs.  Discussed added ozempic rather than metformin due to recurrent loose stools.   Had covid jun 21.  Fatigue still present but improving.  Not exercising.  Not sleeping well for the past 2 weeks due to persistent neck and shoulder pian.  Using heat and TENS unit. Stopped GABAPENTIN AN D OPIOIDS.  Getting a weekly massage which helps  Diarrhea  pre and post COVID : HAS IMPROVED.  managed with probiotic and yogurt (2 Good to Be true and [employer]ik[employer] triple zero).  No cramping,  no blood in stools.  Alternates between formed and soft stools.   Outpatient Medications Prior to Visit  Medication Sig Dispense Refill   cetirizine (ZYRTEC) 10 MG tablet Take 10 mg by mouth 2 (two) times daily.      cholecalciferol (VITAMIN D3) 25 MCG (1000 UNIT) tablet Take 1,000 Units by  mouth daily.     cyanocobalamin (,VITAMIN B-12,) 1000 MCG/ML injection INJECT 1 ML INTO THE MUSCLE ONCE A WEEK FOR 3 WEEKS, THEN MONTHLY THEREAFTER 3 mL 11   etonogestrel (NEXPLANON) 68 MG IMPL implant 1 each by Subdermal route once.     fluticasone (FLONASE) 50 MCG/ACT nasal spray PLACE 2 SPRAYS INTO BOTH NOSTRILS DAILY. 48 g 1   Misc Natural Products (TART CHERRY ADVANCED PO) Take 1 capsule by mouth daily.     omeprazole (PRILOSEC) 40 MG capsule TAKE 1 CAPSULE BY MOUTH DAILY. 90 capsule 1   Turmeric 400 MG CAPS Take 400 mg by mouth daily.      acetaminophen (TYLENOL) 500 MG tablet Take 1,000 mg by mouth every 6 (six) hours as needed. (Patient not taking: Reported on 01/07/2021)     DENAVIR 1 % cream APPLY TOPICALLY EVERY TWO HOURS FOR 4 DAYS AS NEEDED FOR COLD SORES (Patient not taking: Reported on 01/07/2021) 5 g 0   gabapentin (NEURONTIN) 100 MG capsule TAKE 2 CAPSULES (200 MG) BY MOUTH AT BEDTIME. (Patient not taking: Reported on 01/07/2021) 180 capsule 3   gabapentin (NEURONTIN) 300 MG capsule TAKE 1 CAPSULE BY MOUTH AT BEDTIME. (Patient not taking: Reported on 01/07/2021) 90 capsule 3   lidocaine-nystatin-diphenhydrAMINE-hydrocortisone swish and spit 1-2 teaspoonfuls by mouth 3 to 4 times a day as needed for mouth sores (Patient not taking: Reported on 01/07/2021) 200 mL 0   tiZANidine (ZANAFLEX) 4 MG capsule TAKE 1 CAPSULE (4 MG TOTAL) BY MOUTH AT BEDTIME  AS NEEDED FOR MUSCLE SPASMS. (Patient not taking: Reported on 01/07/2021) 30 capsule 2   cyclobenzaprine (FLEXERIL) 5 MG tablet TAKE 1 TABLET BY MOUTH 3 TIMES DAILY AS NEEDED. (Patient not taking: Reported on 01/07/2021) 30 tablet 0   predniSONE (DELTASONE) 5 MG tablet take 6 tablets by mouth for day 1, then decrease by one tablet daily until gone (Patient not taking: Reported on 01/07/2021) 21 tablet 0   tiZANidine (ZANAFLEX) 4 MG tablet TAKE 1 TABLET BY MOUTH AT BEDTIME. (Patient not taking: Reported on 01/07/2021) 30 tablet 0   No  facility-administered medications prior to visit.    Review of Systems;  Patient denies headache, fevers, malaise, unintentional weight loss, skin rash, eye pain, sinus congestion and sinus pain, sore throat, dysphagia,  hemoptysis , cough, dyspnea, wheezing, chest pain, palpitations, orthopnea, edema, abdominal pain, nausea, melena, diarrhea, constipation, flank pain, dysuria, hematuria, urinary  Frequency, nocturia, numbness, tingling, seizures,  Focal weakness, Loss of consciousness,  Tremor, insomnia, depression, anxiety, and suicidal ideation.      Objective:  BP 110/76 (BP Location: Left Arm, Patient Position: Sitting, Cuff Size: Large)   Pulse 82   Temp (!) 95.8 F (35.4 C) (Temporal)   Resp 15   Ht 5\' 2"  (1.575 m)   Wt 287 lb 6.4 oz (130.4 kg)   SpO2 99%   BMI 52.57 kg/m   BP Readings from Last 3 Encounters:  01/07/21 110/76  11/12/20 129/85  08/20/20 125/75    Wt Readings from Last 3 Encounters:  01/07/21 287 lb 6.4 oz (130.4 kg)  08/20/20 285 lb (129.3 kg)  06/19/20 291 lb 12.8 oz (132.4 kg)    General appearance: alert, cooperative and appears stated age Ears: normal TM's and external ear canals both ears Throat: lips, mucosa, and tongue normal; teeth and gums normal Neck: no adenopathy, no carotid bruit, supple, symmetrical, trachea midline and thyroid not enlarged, symmetric, no tenderness/mass/nodules Back: symmetric, no curvature. ROM normal. No CVA tenderness. Lungs: clear to auscultation bilaterally Heart: regular rate and rhythm, S1, S2 normal, no murmur, click, rub or gallop Abdomen: soft, non-tender; bowel sounds normal; no masses,  no organomegaly Pulses: 2+ and symmetric Skin: Skin color, texture, turgor normal. No rashes or lesions Lymph nodes: Cervical, supraclavicular, and axillary nodes normal.  Lab Results  Component Value Date   HGBA1C 6.9 (H) 01/07/2021   HGBA1C 6.7 (H) 07/08/2020   HGBA1C 6.0 07/24/2019    Lab Results  Component Value  Date   CREATININE 0.76 01/07/2021   CREATININE 0.74 07/08/2020   CREATININE 0.79 03/09/2020    Lab Results  Component Value Date   WBC 6.5 01/07/2021   HGB 13.9 01/07/2021   HCT 42.6 01/07/2021   PLT 278.0 01/07/2021   GLUCOSE 111 (H) 01/07/2021   CHOL 184 01/07/2021   TRIG 242.0 (H) 01/07/2021   HDL 37.90 (L) 01/07/2021   LDLDIRECT 114.0 01/07/2021   LDLCALC 107 (H) 07/08/2020   ALT 49 (H) 01/07/2021   AST 32 01/07/2021   NA 139 01/07/2021   K 4.4 01/07/2021   CL 104 01/07/2021   CREATININE 0.76 01/07/2021   BUN 13 01/07/2021   CO2 27 01/07/2021   TSH 2.24 01/07/2021   HGBA1C 6.9 (H) 01/07/2021   MICROALBUR <0.7 07/08/2020    DG Chest 2 View  Result Date: 11/12/2020 CLINICAL DATA:  Recent COVID-19 positive. Cough and shortness of breath EXAM: CHEST - 2 VIEW COMPARISON:  August 20, 2020 FINDINGS: Lungs are clear. Heart size and pulmonary vascularity  are normal. No adenopathy. No bone lesions. IMPRESSION: Lungs clear.  Cardiac silhouette normal. Electronically Signed   By: Bretta Bang III M.D.   On: 11/12/2020 12:55    Assessment & Plan:   Problem List Items Addressed This Visit       Unprioritized   Abnormal breast exam    diagnostic mammogram normal  Resuming annual screening in September       B12 deficiency   Cervical radiculopathy at C7 - Primary    Currently managed without opioids or gabapentin .  Using TENS units . MR and massage therapy      Relevant Orders   For home use only DME Other see comment   Comprehensive metabolic panel (Completed)   Class 3 severe obesity without serious comorbidity with body mass index (BMI) of 40.0 to 44.9 in adult [facility])    Starting Ozempic at 0.25 mg weekly as there are no contraindications (no personal or FH of thyroid CA n, no personal history of pancreatitis )   Sample pen given,  The pen is the 8 mg dose.  She has been advised how to adjust the dose to deliver 0.25 mg dose (ten clicks)       Relevant Medications    Semaglutide, 2 MG/DOSE, 8 MG/3ML SOPN   Hyperlipidemia associated with type 2 diabetes mellitus (HCC)    Current risk is low at 2.2 %.using the AHA cardiac risk calculator, however she has type 2 DM .  Treatment deferred  Lab Results  Component Value Date   CHOL 184 01/07/2021   HDL 37.90 (L) 01/07/2021   LDLCALC 107 (H) 07/08/2020   LDLDIRECT 114.0 01/07/2021   TRIG 242.0 (H) 01/07/2021   CHOLHDL 5 01/07/2021         Relevant Medications   Semaglutide, 2 MG/DOSE, 8 MG/3ML SOPN   Other Relevant Orders   Hemoglobin A1c (Completed)   Lipid panel (Completed)   Loud snoring    Sleep study deferred       Type 2 diabetes mellitus without complications (HCC)    Starting ozempci rather than metformin due to recurrent diarrhea and morbid obesity  Lab Results  Component Value Date   HGBA1C 6.9 (H) 01/07/2021   Lab Results  Component Value Date   MICROALBUR <0.7 07/08/2020   MICROALBUR 0.9 07/24/2019           Relevant Medications   Semaglutide, 2 MG/DOSE, 8 MG/3ML SOPN   Other Visit Diagnoses     Fatigue, unspecified type       Relevant Orders   TSH (Completed)   CBC with Differential/Platelet (Completed)   Vitamin B12 (Completed)   Need for vaccination       Relevant Orders   Pneumococcal conjugate vaccine 20-valent (Prevnar 20) (Completed)      I provided  30 minutes  during this encounter reviewing patient's current problems and past surgeries, labs and imaging studies, providing counseling on  her diabetes and obesity in a facee to face visit  , and coordination  of care .   Meds ordered this encounter  Medications   Insulin Pen Needle (PEN NEEDLES) 32G X 4 MM MISC    Sig: Use weekly with ozempic pen    Dispense:  30 each    Refill:  1   Semaglutide, 2 MG/DOSE, 8 MG/3ML SOPN    Sig: Inject 0.25 mg as directed once a week.    Dispense:  3 mL    Refill:  0  Medications Discontinued During This Encounter  Medication Reason   predniSONE (DELTASONE)  5 MG tablet Completed Course   tiZANidine (ZANAFLEX) 4 MG tablet Duplicate   cyclobenzaprine (FLEXERIL) 5 MG tablet     Follow-up: No follow-ups on file.   Sherlene Shams, MD

## 2021-01-07 NOTE — Assessment & Plan Note (Addendum)
Starting Ozempic at 0.25 mg weekly as there are no contraindications (no personal or FH of thyroid CA n, no personal history of pancreatitis )   Sample pen given,  The pen is the 8 mg dose.  She has been advised how to adjust the dose to deliver 0.25 mg dose (ten clicks)

## 2021-01-08 DIAGNOSIS — E1169 Type 2 diabetes mellitus with other specified complication: Secondary | ICD-10-CM

## 2021-01-09 ENCOUNTER — Encounter: Payer: Self-pay | Admitting: Internal Medicine

## 2021-01-09 NOTE — Assessment & Plan Note (Signed)
Currently managed without opioids or gabapentin .  Using TENS units . MR and massage therapy

## 2021-01-09 NOTE — Assessment & Plan Note (Addendum)
Emphasized need for low GI diet.  Recommend Starting ozempic rather than metformin due to recurrent diarrhea and morbid obesity  Lab Results  Component Value Date   HGBA1C 6.9 (H) 01/07/2021   Lab Results  Component Value Date   MICROALBUR <0.7 07/08/2020   MICROALBUR 0.9 07/24/2019

## 2021-01-09 NOTE — Assessment & Plan Note (Signed)
Sleep study deferred

## 2021-01-09 NOTE — Assessment & Plan Note (Addendum)
Current risk is low at 2.2 %.using the AHA cardiac risk calculator, however she has type 2 DM .  Treatment deferred  Lab Results  Component Value Date   CHOL 184 01/07/2021   HDL 37.90 (L) 01/07/2021   LDLCALC 107 (H) 07/08/2020   LDLDIRECT 114.0 01/07/2021   TRIG 242.0 (H) 01/07/2021   CHOLHDL 5 01/07/2021

## 2021-01-13 ENCOUNTER — Other Ambulatory Visit: Payer: Self-pay

## 2021-01-13 MED FILL — Cyanocobalamin Inj 1000 MCG/ML: INTRAMUSCULAR | 28 days supply | Qty: 3 | Fill #0 | Status: AC

## 2021-01-15 ENCOUNTER — Ambulatory Visit: Payer: Self-pay

## 2021-01-15 ENCOUNTER — Other Ambulatory Visit: Payer: Self-pay

## 2021-01-15 ENCOUNTER — Ambulatory Visit (INDEPENDENT_AMBULATORY_CARE_PROVIDER_SITE_OTHER): Payer: No Typology Code available for payment source | Admitting: Family Medicine

## 2021-01-15 VITALS — BP 120/82 | HR 103 | Ht 62.0 in | Wt 285.0 lb

## 2021-01-15 DIAGNOSIS — M999 Biomechanical lesion, unspecified: Secondary | ICD-10-CM | POA: Diagnosis not present

## 2021-01-15 DIAGNOSIS — M9902 Segmental and somatic dysfunction of thoracic region: Secondary | ICD-10-CM

## 2021-01-15 DIAGNOSIS — M5412 Radiculopathy, cervical region: Secondary | ICD-10-CM | POA: Diagnosis not present

## 2021-01-15 DIAGNOSIS — M9908 Segmental and somatic dysfunction of rib cage: Secondary | ICD-10-CM | POA: Diagnosis not present

## 2021-01-15 DIAGNOSIS — M9901 Segmental and somatic dysfunction of cervical region: Secondary | ICD-10-CM

## 2021-01-15 DIAGNOSIS — M25512 Pain in left shoulder: Secondary | ICD-10-CM | POA: Diagnosis not present

## 2021-01-15 NOTE — Assessment & Plan Note (Signed)
   Decision today to treat with OMT was based on Physical Exam  After verbal consent patient was treated with HVLA, ME, FPR techniques in cervical, thoracic, rib, lumbar and sacralmodifications  See medications in patient instructions if given  Patient will follow up in 6-8 weeks

## 2021-01-15 NOTE — Assessment & Plan Note (Signed)
Patient does have some mild signs of potential radiculopathy but today did not have any significant radiation negative Spurling's.  Discussed with patient about icing regimen and home exercises.  Discussed avoiding certain activities.  Patient responded well to manipulation.  Follow-up again 6 to 8 weeks

## 2021-01-15 NOTE — Progress Notes (Signed)
[name] [address] [place] [address] Phone: [phone] Subjective:   Lisa [name], am serving as a scribe for Dr. Antoine [name]. This visit occurred during the SARS-CoV-2 public health emergency.  Safety protocols were in place, including screening questions prior to the visit, additional usage of staff PPE, and extensive cleaning of exam room while observing appropriate contact time as indicated for disinfecting solutions.   I'm seeing this patient by the request  of:  Lisa Shams, MD  CC: Left shoulder pain  [id number]  Lisa [name] is a 45 y.o. female coming in with complaint of left cervical and L shoulder pain. Seen for cervical radiculopathy in 2021. Some pain and numbness in all fingers in L hand. Pain increased 2 weeks ago. Patient is back on 100mg  of gabapentin which helped her rest as she is uncomfortable when trying to sleep.   MRI Cervical 02/2020 IMPRESSION: 1. Minimal disc osteophyte complex with left uncovertebral spurring at C5-C6 resulting in mild left foraminal stenosis. 2. No canal stenosis at any level    Past Medical History:  Diagnosis Date   Bronchitis    hx of   Cervical radiculitis 03/03/2020   Complication of anesthesia    COVID-19 10/2020   DDD (degenerative disc disease), cervical 02/28/2020   Dysrhythmia    "does not see cardiologist"   Family history of anesthesia complication    "morphine night terrors"   GERD (gastroesophageal reflux disease)    Headache(784.0)    "migraines"   Hypertension    PONV (postoperative nausea and vomiting)    Pre-syncope 07/14/2014   Shortness of breath    "wheezing"   Weakness of left arm 03/03/2020   Past Surgical History:  Procedure Laterality Date   CESAREAN SECTION     x 2   MICROLARYNGOSCOPY WITH DILATION N/A 03/13/2020   Procedure: MICROLARYNGOSCOPY WITH DILATION w/JET VENT MITOMYCIN C;  Surgeon: 03/15/2020, MD;  Location: [facility] OR;   Service: ENT;  Laterality: N/A;   MICROLARYNGOSCOPY WITH LASER AND BALLOON DILATION N/A 07/20/2012   Procedure: MICROLARYNGOSCOPY WITH LASER AND BALLOON DILATION;  Surgeon: 09/19/2012, MD;  Location: [facility] OR;  Service: ENT;  Laterality: N/A;  WITH JET VENTURI VENTILATION   TONGUE FLAP RELEASE     widom extractions     Social History   Socioeconomic History   Marital status: Married    Spouse name: Not on file   Number of children: Not on file   Years of education: Not on file   Highest education level: Not on file  Occupational History   Not on file  Tobacco Use   Smoking status: Never   Smokeless tobacco: Never  Vaping Use   Vaping Use: Never used  Substance and Sexual Activity   Alcohol use: Yes    Comment: "social"   Drug use: No   Sexual activity: Yes    Birth control/protection: Implant  Other Topics Concern   Not on file  Social History Narrative   Not on file   Social Determinants of Health   Financial Resource Strain: Not on file  Food Insecurity: Not on file  Transportation Needs: Not on file  Physical Activity: Not on file  Stress: Not on file  Social Connections: Not on file   Allergies  Allergen Reactions   Latex Hives    Rash and hives   Family History  Problem Relation Age of Onset   Hypertension Mother    Hyperlipidemia  Mother    Arthritis Mother    Rheum arthritis Mother    Hypertension Father    Hyperlipidemia Father    Breast cancer Neg Hx     Current Outpatient Medications (Endocrine & Metabolic):    etonogestrel (NEXPLANON) 68 MG IMPL implant, 1 each by Subdermal route once.   lidocaine-nystatin-diphenhydrAMINE-hydrocortisone*, swish and spit 1-2 teaspoonfuls by mouth 3 to 4 times a day as needed for mouth sores   Semaglutide, 2 MG/DOSE, 8 MG/3ML SOPN, Inject 0.25 mg as directed once a week.   Current Outpatient Medications (Respiratory):    cetirizine (ZYRTEC) 10 MG tablet, Take 10 mg by mouth 2 (two) times daily.    fluticasone  (FLONASE) 50 MCG/ACT nasal spray, PLACE 2 SPRAYS INTO BOTH NOSTRILS DAILY.   lidocaine-nystatin-diphenhydrAMINE-hydrocortisone*, swish and spit 1-2 teaspoonfuls by mouth 3 to 4 times a day as needed for mouth sores  Current Outpatient Medications (Analgesics):    acetaminophen (TYLENOL) 500 MG tablet, Take 1,000 mg by mouth every 6 (six) hours as needed.  Current Outpatient Medications (Hematological):    cyanocobalamin (,VITAMIN B-12,) 1000 MCG/ML injection, INJECT 1 ML INTO THE MUSCLE ONCE A WEEK FOR 3 WEEKS, THEN MONTHLY THEREAFTER  Current Outpatient Medications (Other):    cholecalciferol (VITAMIN D3) 25 MCG (1000 UNIT) tablet, Take 1,000 Units by mouth daily.   DENAVIR 1 % cream, APPLY TOPICALLY EVERY TWO HOURS FOR 4 DAYS AS NEEDED FOR COLD SORES   gabapentin (NEURONTIN) 100 MG capsule, TAKE 2 CAPSULES (200 MG) BY MOUTH AT BEDTIME.   gabapentin (NEURONTIN) 300 MG capsule, TAKE 1 CAPSULE BY MOUTH AT BEDTIME.   Insulin Pen Needle (PEN NEEDLES) 32G X 4 MM MISC, Use weekly with ozempic pen   lidocaine-nystatin-diphenhydrAMINE-hydrocortisone*, swish and spit 1-2 teaspoonfuls by mouth 3 to 4 times a day as needed for mouth sores   Misc Natural Products (TART CHERRY ADVANCED PO), Take 1 capsule by mouth daily.   omeprazole (PRILOSEC) 40 MG capsule, TAKE 1 CAPSULE BY MOUTH DAILY.   tiZANidine (ZANAFLEX) 4 MG capsule, TAKE 1 CAPSULE (4 MG TOTAL) BY MOUTH AT BEDTIME AS NEEDED FOR MUSCLE SPASMS.   Turmeric 400 MG CAPS, Take 400 mg by mouth daily.  * These medications belong to multiple therapeutic classes and are listed under each applicable group.   Reviewed prior external information including notes and imaging from  primary care provider As well as notes that were available from care everywhere and other healthcare systems.  Past medical history, social, surgical and family history all reviewed in electronic medical record.  No pertanent information unless stated regarding to the chief  complaint.   Review of Systems:  No headache, visual changes, nausea, vomiting, diarrhea, constipation, dizziness, abdominal pain, skin rash, fevers, chills, night sweats, weight loss, swollen lymph nodes, body aches, joint swelling, chest pain, shortness of breath, mood changes. POSITIVE muscle aches  Objective  Blood pressure 120/82, pulse (!) 103, height 5\' 2"  (1.575 m), weight 285 lb (129.3 kg), peak flow 98 L/min.   General: No apparent distress alert and oriented x3 mood and affect normal, dressed appropriately.  HEENT: Pupils equal, extraocular movements intact  Respiratory: Patient's speak in full sentences and does not appear short of breath  Cardiovascular: No lower extremity edema, non tender, no erythema  Gait normal with good balance and coordination.  MSK: Neck exam does show some mild loss of lordosis.  Some tenderness to palpation in the parascapular region.  Scapular dyskinesis left greater than right. Negative Spurling's on the neck.  5/5 strength of the upper extremities Osteopathic findings C6 flexed rotated and side bent left T4 extended rotated and side bent left with inhaled rib   Impression and Recommendations:     The above documentation has been reviewed and is accurate and complete [name], DO

## 2021-01-15 NOTE — Patient Instructions (Addendum)
Good to see you!  CoQ10 200mg  daily Stay active and try to keep the shoulders back. Exercises 3 times a week.   See me again in 5 weeks.

## 2021-01-19 ENCOUNTER — Encounter: Payer: Self-pay | Admitting: Family Medicine

## 2021-01-19 ENCOUNTER — Other Ambulatory Visit: Payer: Self-pay

## 2021-02-01 ENCOUNTER — Ambulatory Visit
Admission: RE | Admit: 2021-02-01 | Discharge: 2021-02-01 | Disposition: A | Discharge status: Home / Self Care | Payer: No Typology Code available for payment source | Source: Ambulatory Visit | Attending: Internal Medicine | Admitting: Internal Medicine

## 2021-02-01 ENCOUNTER — Other Ambulatory Visit: Payer: Self-pay

## 2021-02-01 ENCOUNTER — Telehealth: Payer: No Typology Code available for payment source | Admitting: Physician Assistant

## 2021-02-01 DIAGNOSIS — R3989 Other symptoms and signs involving the genitourinary system: Secondary | ICD-10-CM

## 2021-02-01 DIAGNOSIS — Z1231 Encounter for screening mammogram for malignant neoplasm of breast: Secondary | ICD-10-CM | POA: Insufficient documentation

## 2021-02-01 MED ORDER — CEPHALEXIN 500 MG PO CAPS
500.0000 mg | ORAL_CAPSULE | Freq: Two times a day (BID) | ORAL | 0 refills | Status: DC
  Filled 2021-02-01: qty 14, 7d supply, fill #0

## 2021-02-01 NOTE — Progress Notes (Signed)

## 2021-02-11 NOTE — Progress Notes (Signed)
[name] [address] [place] [address] Phone: [phone] Subjective:   Lisa [name], am serving as a scribe for Dr. Antoine Primas. This visit occurred during the SARS-CoV-2 public health emergency.  Safety protocols were in place, including screening questions prior to the visit, additional usage of staff PPE, and extensive cleaning of exam room while observing appropriate contact time as indicated for disinfecting solutions.   I'm seeing this patient by the request  of:  Sherlene Shams, MD  CC: Neck pain and left arm pain follow-up  ZTI:[id number]  Lisa [name] is a 45 y.o. female coming in with complaint of back and neck pain. OMT 01/15/2021. Patient states that her pain has moved from cervical spine to the L scapula. Patient is having pain in arm that seems to be radiating from neck. Stretching is not helping.   Medications patient has been prescribed: Gabapentin 200mg  QHS  Taking:         Reviewed prior external information including notes and imaging from previsou exam, outside providers and external EMR if available.   As well as notes that were available from care everywhere and other healthcare systems.  Past medical history, social, surgical and family history all reviewed in electronic medical record.  No pertanent information unless stated regarding to the chief complaint.   Past Medical History:  Diagnosis Date   Bronchitis    hx of   Cervical radiculitis 03/03/2020   Complication of anesthesia    COVID-19 10/2020   DDD (degenerative disc disease), cervical 02/28/2020   Dysrhythmia    "does not see cardiologist"   Family history of anesthesia complication    "morphine night terrors"   GERD (gastroesophageal reflux disease)    Headache(784.0)    "migraines"   Hypertension    PONV (postoperative nausea and vomiting)    Pre-syncope 07/14/2014   Shortness of breath    "wheezing"   Weakness of left arm  03/03/2020    Allergies  Allergen Reactions   Latex Hives    Rash and hives     Review of Systems:  No headache, visual changes, nausea, vomiting, diarrhea, constipation, dizziness, abdominal pain, skin rash, fevers, chills, night sweats, weight loss, swollen lymph nodes, body aches, joint swelling, chest pain, shortness of breath, mood changes. POSITIVE muscle aches  Objective  Blood pressure 116/80, pulse 83, height 5\' 2"  (1.575 m), weight 284 lb (128.8 kg), SpO2 99 %.   General: No apparent distress alert and oriented x3 mood and affect normal, dressed appropriately.  HEENT: Pupils equal, extraocular movements intact  Respiratory: Patient's speak in full sentences and does not appear short of breath  Cardiovascular: No lower extremity edema, non tender, no erythema  Patient neck exam does have significant loss of lordosis.  Tightness noted with any type of extension greater than 10 degrees.  Positive Spurling's in the C6-C7 distribution.  Patient does have good grip strength noted.  Significant tightness down the parascapular region on the left side.  Osteopathic findings  C6 flexed rotated and side bent left T3 extended rotated and side bent left inhaled rib T9 extended rotated and side bent left       Assessment and Plan:  Cervical radiculopathy at C7 Patient still is having some signs that is consistent with more of the C6 and C7 radiculopathy down the left arm.  Patient does have the imaging.  Patient does have a C5-C6.  Patient is on gabapentin and continues to take  it as intermittently.  Increase activity slowly.  Follow-up again in 6 weeks   Nonallopathic problems  Decision today to treat with OMT was based on Physical Exam  After verbal consent patient was treated with HVLA, ME, FPR techniques in cervical, rib, thoracic  areas  Patient tolerated the procedure well with improvement in symptoms  Patient given exercises, stretches and lifestyle  modifications  See medications in patient instructions if given  Patient will follow up in 4-8 weeks     The above documentation has been reviewed and is accurate and complete [name], DO        Note: This dictation was prepared with Dragon dictation along with smaller phrase technology. Any transcriptional errors that result from this process are unintentional.

## 2021-02-12 ENCOUNTER — Other Ambulatory Visit: Payer: Self-pay

## 2021-02-12 MED FILL — Fluticasone Propionate Nasal Susp 50 MCG/ACT: NASAL | 90 days supply | Qty: 48 | Fill #0 | Status: AC

## 2021-02-12 MED FILL — Gabapentin Cap 100 MG: ORAL | 90 days supply | Qty: 180 | Fill #0 | Status: AC

## 2021-02-15 ENCOUNTER — Other Ambulatory Visit: Payer: Self-pay

## 2021-02-16 ENCOUNTER — Ambulatory Visit (INDEPENDENT_AMBULATORY_CARE_PROVIDER_SITE_OTHER): Payer: No Typology Code available for payment source | Admitting: Family Medicine

## 2021-02-16 ENCOUNTER — Other Ambulatory Visit: Payer: Self-pay

## 2021-02-16 ENCOUNTER — Encounter: Payer: Self-pay | Admitting: Family Medicine

## 2021-02-16 VITALS — BP 116/80 | HR 83 | Ht 62.0 in | Wt 284.0 lb

## 2021-02-16 DIAGNOSIS — M5412 Radiculopathy, cervical region: Secondary | ICD-10-CM | POA: Diagnosis not present

## 2021-02-16 DIAGNOSIS — M9902 Segmental and somatic dysfunction of thoracic region: Secondary | ICD-10-CM

## 2021-02-16 DIAGNOSIS — M9908 Segmental and somatic dysfunction of rib cage: Secondary | ICD-10-CM | POA: Diagnosis not present

## 2021-02-16 DIAGNOSIS — M9901 Segmental and somatic dysfunction of cervical region: Secondary | ICD-10-CM | POA: Diagnosis not present

## 2021-02-16 NOTE — Assessment & Plan Note (Signed)
Patient still is having some signs that is consistent with more of the C6 and C7 radiculopathy down the left arm.  Patient does have the imaging.  Patient does have a C5-C6.  Patient is on gabapentin and continues to take it as intermittently.  Increase activity slowly.  Follow-up again in 6 weeks

## 2021-02-16 NOTE — Patient Instructions (Signed)
See me in 4-6 weeks Keep working on posture Can play with gabapentin

## 2021-03-15 NOTE — Telephone Encounter (Signed)
Okay for plane ride anxiety medication and motion sickness medication?

## 2021-03-22 ENCOUNTER — Other Ambulatory Visit: Payer: Self-pay

## 2021-03-22 MED ORDER — SCOPOLAMINE 1 MG/3DAYS TD PT72
1.0000 | MEDICATED_PATCH | TRANSDERMAL | 0 refills | Status: DC
  Filled 2021-03-22: qty 4, 12d supply, fill #0

## 2021-03-22 MED ORDER — ALPRAZOLAM 0.25 MG PO TABS
0.2500 mg | ORAL_TABLET | Freq: Two times a day (BID) | ORAL | 0 refills | Status: DC | PRN
  Filled 2021-03-22: qty 20, 10d supply, fill #0

## 2021-03-22 MED FILL — Tizanidine HCl Cap 4 MG (Base Equivalent): ORAL | 30 days supply | Qty: 30 | Fill #0 | Status: AC

## 2021-03-23 ENCOUNTER — Ambulatory Visit: Payer: No Typology Code available for payment source | Admitting: Family Medicine

## 2021-04-13 ENCOUNTER — Other Ambulatory Visit: Payer: Self-pay

## 2021-04-13 ENCOUNTER — Other Ambulatory Visit (INDEPENDENT_AMBULATORY_CARE_PROVIDER_SITE_OTHER): Payer: No Typology Code available for payment source

## 2021-04-13 DIAGNOSIS — E1169 Type 2 diabetes mellitus with other specified complication: Secondary | ICD-10-CM

## 2021-04-13 LAB — COMPREHENSIVE METABOLIC PANEL
ALT: 35 U/L (ref 0–35)
AST: 20 U/L (ref 0–37)
Albumin: 4.3 g/dL (ref 3.5–5.2)
Alkaline Phosphatase: 50 U/L (ref 39–117)
BUN: 14 mg/dL (ref 6–23)
CO2: 26 mEq/L (ref 19–32)
Calcium: 9.5 mg/dL (ref 8.4–10.5)
Chloride: 103 mEq/L (ref 96–112)
Creatinine, Ser: 0.78 mg/dL (ref 0.40–1.20)
GFR: 92.47 mL/min (ref >60.00)
Glucose, Bld: 120 mg/dL — ABNORMAL HIGH (ref 70–99)
Potassium: 4.2 mEq/L (ref 3.5–5.1)
Sodium: 137 mEq/L (ref 135–145)
Total Bilirubin: 0.6 mg/dL (ref 0.2–1.2)
Total Protein: 6.7 g/dL (ref 6.0–8.3)

## 2021-04-13 LAB — LIPID PANEL
Cholesterol: 172 mg/dL (ref 0–200)
HDL: 35.5 mg/dL — ABNORMAL LOW (ref >39.00)
NonHDL: 136.96
Total CHOL/HDL Ratio: 5
Triglycerides: 253 mg/dL — ABNORMAL HIGH (ref 0.0–149.0)
VLDL: 50.6 mg/dL — ABNORMAL HIGH (ref 0.0–40.0)

## 2021-04-13 LAB — LDL CHOLESTEROL, DIRECT: Direct LDL: 103 mg/dL

## 2021-04-13 LAB — HEMOGLOBIN A1C: Hgb A1c MFr Bld: 6.2 % (ref 4.6–6.5)

## 2021-04-15 ENCOUNTER — Other Ambulatory Visit: Payer: Self-pay

## 2021-04-21 ENCOUNTER — Other Ambulatory Visit: Payer: Self-pay | Admitting: Internal Medicine

## 2021-04-22 ENCOUNTER — Other Ambulatory Visit: Payer: Self-pay | Admitting: Internal Medicine

## 2021-04-22 ENCOUNTER — Other Ambulatory Visit: Payer: Self-pay

## 2021-04-22 MED FILL — Omeprazole Cap Delayed Release 40 MG: ORAL | 90 days supply | Qty: 90 | Fill #0 | Status: AC

## 2021-04-23 ENCOUNTER — Other Ambulatory Visit: Payer: Self-pay

## 2021-04-23 ENCOUNTER — Ambulatory Visit (INDEPENDENT_AMBULATORY_CARE_PROVIDER_SITE_OTHER): Payer: No Typology Code available for payment source | Admitting: Internal Medicine

## 2021-04-23 ENCOUNTER — Encounter: Payer: Self-pay | Admitting: Internal Medicine

## 2021-04-23 VITALS — BP 118/84 | HR 88 | Temp 96.6°F | Ht 62.0 in | Wt 273.2 lb

## 2021-04-23 DIAGNOSIS — R29898 Other symptoms and signs involving the musculoskeletal system: Secondary | ICD-10-CM | POA: Diagnosis not present

## 2021-04-23 DIAGNOSIS — E119 Type 2 diabetes mellitus without complications: Secondary | ICD-10-CM

## 2021-04-23 DIAGNOSIS — E785 Hyperlipidemia, unspecified: Secondary | ICD-10-CM

## 2021-04-23 DIAGNOSIS — E1169 Type 2 diabetes mellitus with other specified complication: Secondary | ICD-10-CM | POA: Diagnosis not present

## 2021-04-23 NOTE — Patient Instructions (Addendum)
I RECOMMEND THAT YOU Increase the ozempic to 1 mg  this week.   (That's 37 clicks up from the bottom using the 2 mg "gold" pen)  Fasting labs prior to CPE in feb have been ordered   START SOME FORM OF EXERCISE  May the Lord give you peace and joy during this holiday season, and may the promise of His return bring you comfort and hope for the future.

## 2021-04-23 NOTE — Assessment & Plan Note (Signed)
Current risk is low at 2.2 %.using the AHA cardiac risk calculator, however she has type 2 DM .  Treatment again deferred by patient.   Low HDL/.high trigs as markers of sedentary lifestyle  Lab Results  Component Value Date   CHOL 172 04/13/2021   HDL 35.50 (L) 04/13/2021   LDLCALC 107 (H) 07/08/2020   LDLDIRECT 103.0 04/13/2021   TRIG 253.0 (H) 04/13/2021   CHOLHDL 5 04/13/2021

## 2021-04-23 NOTE — Assessment & Plan Note (Addendum)
Marked improvement  in a1c since starting ozempic. .continue at 1 mg dose going forward . Sample of 2 mg pen given  Lab Results  Component Value Date   HGBA1C 6.2 04/13/2021   Lab Results  Component Value Date   MICROALBUR <0.7 07/08/2020   MICROALBUR 0.9 07/24/2019

## 2021-04-23 NOTE — Progress Notes (Signed)
Subjective:  Patient ID: [name], female    DOB: 1975-08-14  Age: 45 y.o. MRN: [id number]  CC: The primary encounter diagnosis was Weakness of left arm. Diagnoses of Type 2 diabetes mellitus without complication, without long-term current use of insulin ([place]), Hyperlipidemia associated with type 2 diabetes mellitus ([place]), and Morbid obesity ([place]) were also pertinent to this visit.  HPI Lisa [name] presents for follow up on pharmacotherapy for obesity Chief Complaint  Patient presents with   Follow-up    Follow up on medications and lab results    This visit occurred during the SARS-CoV-2 public health emergency.  Safety protocols were in place, including screening questions prior to the visit, additional usage of staff PPE, and extensive cleaning of exam room while observing appropriate contact time as indicated for disinfecting solutions.   Obesity:  She has been taking ozempic . Since late August and currently taing 0.5 mg weekly  since Oct 1.  Not drinking enough water on procedure days ,  not exercising  eating about 50% of usual size meal,  snacks on fruit or peanut butter pretzels when symptoms of hypglycemia start.  Has not checked sugar.    Labs from Nov 29 reviewed.  Addressed low HDL /high triglycerides.  Not exercising yet.     Outpatient Medications Prior to Visit  Medication Sig Dispense Refill   acetaminophen (TYLENOL) 500 MG tablet Take 1,000 mg by mouth every 6 (six) hours as needed.     ALPRAZolam (XANAX) 0.25 MG tablet Take 1 tablet (0.25 mg total) by mouth 2 (two) times daily as needed for anxiety. 20 tablet 0   cetirizine (ZYRTEC) 10 MG tablet Take 10 mg by mouth 2 (two) times daily.      cholecalciferol (VITAMIN D3) 25 MCG (1000 UNIT) tablet Take 1,000 Units by mouth daily.     cyanocobalamin (,VITAMIN B-12,) 1000 MCG/ML injection INJECT 1 ML INTO THE MUSCLE ONCE A WEEK FOR 3 WEEKS, THEN MONTHLY THEREAFTER 3 mL 11   DENAVIR 1 % cream APPLY  TOPICALLY EVERY TWO HOURS FOR 4 DAYS AS NEEDED FOR COLD SORES 5 g 0   etonogestrel (NEXPLANON) 68 MG IMPL implant 1 each by Subdermal route once.     fluticasone (FLONASE) 50 MCG/ACT nasal spray PLACE 2 SPRAYS INTO BOTH NOSTRILS DAILY. 48 g 1   gabapentin (NEURONTIN) 100 MG capsule TAKE 2 CAPSULES (200 MG) BY MOUTH AT BEDTIME. 180 capsule 3   Insulin Pen Needle (PEN NEEDLES) 32G X 4 MM MISC Use weekly with ozempic pen 100 each 0   lidocaine-nystatin-diphenhydrAMINE-hydrocortisone swish and spit 1-2 teaspoonfuls by mouth 3 to 4 times a day as needed for mouth sores 200 mL 0   Misc Natural Products (TART CHERRY ADVANCED PO) Take 1 capsule by mouth daily.     omeprazole (PRILOSEC) 40 MG capsule TAKE 1 CAPSULE BY MOUTH DAILY. 90 capsule 1   scopolamine (TRANSDERM-SCOP, 1.5 MG,) 1 MG/3DAYS Place 1 patch (1.5 mg total) onto the skin every 3 (three) days. 4 patch 0   Semaglutide, 2 MG/DOSE, 8 MG/3ML SOPN Inject 0.25 mg as directed once a week. 3 mL 0   tiZANidine (ZANAFLEX) 4 MG capsule TAKE 1 CAPSULE (4 MG TOTAL) BY MOUTH AT BEDTIME AS NEEDED FOR MUSCLE SPASMS. 30 capsule 2   Turmeric 400 MG CAPS Take 400 mg by mouth daily.      gabapentin (NEURONTIN) 300 MG capsule TAKE 1 CAPSULE BY MOUTH AT BEDTIME. 90 capsule 3   cephALEXin (  KEFLEX) 500 MG capsule Take 1 capsule (500 mg total) by mouth 2 (two) times daily. 14 capsule 0   No facility-administered medications prior to visit.    Review of Systems;  Patient denies headache, fevers, malaise, unintentional weight loss, skin rash, eye pain, sinus congestion and sinus pain, sore throat, dysphagia,  hemoptysis , cough, dyspnea, wheezing, chest pain, palpitations, orthopnea, edema, abdominal pain, nausea, melena, diarrhea, constipation, flank pain, dysuria, hematuria, urinary  Frequency, nocturia, numbness, tingling, seizures,  Focal weakness, Loss of consciousness,  Tremor, insomnia, depression, anxiety, and suicidal ideation.      Objective:  BP 118/84  (BP Location: Left Arm, Patient Position: Sitting, Cuff Size: Large)   Pulse 88   Temp (!) 96.6 F (35.9 C) (Temporal)   Ht 5\' 2"  (1.575 m)   Wt 273 lb 3.2 oz (123.9 kg)   SpO2 97%   BMI 49.97 kg/m   BP Readings from Last 3 Encounters:  04/23/21 118/84  02/16/21 116/80  01/15/21 120/82    Wt Readings from Last 3 Encounters:  04/23/21 273 lb 3.2 oz (123.9 kg)  02/16/21 284 lb (128.8 kg)  01/15/21 285 lb (129.3 kg)    General appearance: alert, cooperative and appears stated age Ears: normal TM's and external ear canals both ears Throat: lips, mucosa, and tongue normal; teeth and gums normal Neck: no adenopathy, no carotid bruit, supple, symmetrical, trachea midline and thyroid not enlarged, symmetric, no tenderness/mass/nodules Back: symmetric, no curvature. ROM normal. No CVA tenderness. Lungs: clear to auscultation bilaterally Heart: regular rate and rhythm, S1, S2 normal, no murmur, click, rub or gallop Abdomen: soft, non-tender; bowel sounds normal; no masses,  no organomegaly Pulses: 2+ and symmetric Skin: Skin color, texture, turgor normal. No rashes or lesions Lymph nodes: Cervical, supraclavicular, and axillary nodes normal.  Lab Results  Component Value Date   HGBA1C 6.2 04/13/2021   HGBA1C 6.9 (H) 01/07/2021   HGBA1C 6.7 (H) 07/08/2020    Lab Results  Component Value Date   CREATININE 0.78 04/13/2021   CREATININE 0.76 01/07/2021   CREATININE 0.74 07/08/2020    Lab Results  Component Value Date   WBC 6.5 01/07/2021   HGB 13.9 01/07/2021   HCT 42.6 01/07/2021   PLT 278.0 01/07/2021   GLUCOSE 120 (H) 04/13/2021   CHOL 172 04/13/2021   TRIG 253.0 (H) 04/13/2021   HDL 35.50 (L) 04/13/2021   LDLDIRECT 103.0 04/13/2021   LDLCALC 107 (H) 07/08/2020   ALT 35 04/13/2021   AST 20 04/13/2021   NA 137 04/13/2021   K 4.2 04/13/2021   CL 103 04/13/2021   CREATININE 0.78 04/13/2021   BUN 14 04/13/2021   CO2 26 04/13/2021   TSH 2.24 01/07/2021   HGBA1C 6.2  04/13/2021   MICROALBUR <0.7 07/08/2020    MM 3D SCREEN BREAST BILATERAL  Result Date: 02/04/2021 CLINICAL DATA:  Screening. EXAM: DIGITAL SCREENING BILATERAL MAMMOGRAM WITH TOMOSYNTHESIS AND CAD TECHNIQUE: Bilateral screening digital craniocaudal and mediolateral oblique mammograms were obtained. Bilateral screening digital breast tomosynthesis was performed. The images were evaluated with computer-aided detection. COMPARISON:  Previous exam(s). ACR Breast Density Category a: The breast tissue is almost entirely fatty. FINDINGS: There are no findings suspicious for malignancy. IMPRESSION: No mammographic evidence of malignancy. A result letter of this screening mammogram will be mailed directly to the patient. RECOMMENDATION: Screening mammogram in one year. (Code:SM-B-01Y) BI-RADS CATEGORY  1: Negative. Electronically Signed   By: Valentino Saxon M.D.   On: 02/04/2021 14:25   Assessment & Plan:  Problem List Items Addressed This Visit     Weakness of left arm - Primary   Type 2 diabetes mellitus without complications (HCC)    Marked improvement  in a1c since starting ozempic. .continue at 1 mg dose going forward . Sample of 2 mg pen given  Lab Results  Component Value Date   HGBA1C 6.2 04/13/2021   Lab Results  Component Value Date   MICROALBUR <0.7 07/08/2020   MICROALBUR 0.9 07/24/2019           Relevant Orders   Comprehensive metabolic panel   Lipid panel   Microalbumin / creatinine urine ratio   Morbid obesity (HCC)    She has lost 14 lbs since start of Ozempic in late August.  Her weight has increased in the last 1-2 weeks by home scale and she has no nausea with 0.5 mg dose ,  increase to 1 mg weekly. Strongly encouraged to start exercising       Hyperlipidemia associated with type 2 diabetes mellitus (HCC)    Current risk is low at 2.2 %.using the AHA cardiac risk calculator, however she has type 2 DM .  Treatment again deferred by patient.   Low HDL/.high trigs  as markers of sedentary lifestyle  Lab Results  Component Value Date   CHOL 172 04/13/2021   HDL 35.50 (L) 04/13/2021   LDLCALC 107 (H) 07/08/2020   LDLDIRECT 103.0 04/13/2021   TRIG 253.0 (H) 04/13/2021   CHOLHDL 5 04/13/2021          I have discontinued Marcelino Duster K. Keough's cephALEXin. I am also having her maintain her etonogestrel, cetirizine, Denavir, cholecalciferol, Turmeric, Misc Natural Products (TART CHERRY ADVANCED PO), acetaminophen, cyanocobalamin, tiZANidine, fluticasone, gabapentin, gabapentin, lidocaine-nystatin-diphenhydrAMINE-hydrocortisone, Pen Needles, Semaglutide (2 MG/DOSE), scopolamine, ALPRAZolam, and omeprazole.  No orders of the defined types were placed in this encounter.    I provided  30 minutes of  face-to-face time during this encounter reviewing patient's current problems and past surgeries, labs and imaging studies, providing counseling on the above mentioned problems , and coordination  of care .   Follow-up: No follow-ups on file.   Sherlene Shams, MD

## 2021-04-23 NOTE — Assessment & Plan Note (Addendum)
She has lost 14 lbs since start of Ozempic in late August.  Her weight has increased in the last 1-2 weeks by home scale and she has no nausea with 0.5 mg dose ,  increase to 1 mg weekly. Strongly encouraged to start exercising

## 2021-04-28 NOTE — Progress Notes (Signed)
[name] [address] [place] [address] Phone: [phone] Subjective:   [name], am serving as a scribe for Dr. Antoine Primas.  This visit occurred during the SARS-CoV-2 public health emergency.  Safety protocols were in place, including screening questions prior to the visit, additional usage of staff PPE, and extensive cleaning of exam room while observing appropriate contact time as indicated for disinfecting solutions.    I'm seeing this patient by the request  of:  Sherlene Shams, MD  CC: Neck and back pain follow-up  YIA:[id number]  Lisa [name] is a 45 y.o. female coming in with complaint of back and neck pain. OMT 02/16/2021. Patient states that she has been doing well since last visit. Sore ribs today near scapula.   Medications patient has been prescribed: None          Reviewed prior external information including notes and imaging from previsou exam, outside providers and external EMR if available.   As well as notes that were available from care everywhere and other healthcare systems.  Past medical history, social, surgical and family history all reviewed in electronic medical record.  No pertanent information unless stated regarding to the chief complaint.   Past Medical History:  Diagnosis Date   Bronchitis    hx of   Cervical radiculitis 03/03/2020   Complication of anesthesia    COVID-19 10/2020   DDD (degenerative disc disease), cervical 02/28/2020   Dysrhythmia    "does not see cardiologist"   Family history of anesthesia complication    "morphine night terrors"   GERD (gastroesophageal reflux disease)    Headache(784.0)    "migraines"   Hypertension    PONV (postoperative nausea and vomiting)    Pre-syncope 07/14/2014   Shortness of breath    "wheezing"   Weakness of left arm 03/03/2020    Allergies  Allergen Reactions   Latex Hives    Rash and hives     Review of Systems:  No  headache, visual changes, nausea, vomiting, diarrhea, constipation, dizziness, abdominal pain, skin rash, fevers, chills, night sweats, weight loss, swollen lymph nodes, body aches, joint swelling, chest pain, shortness of breath, mood changes. POSITIVE muscle aches  Objective  Blood pressure 122/76, pulse 84, height 5\' 2"  (1.575 m), weight 271 lb (122.9 kg), SpO2 99 %.   General: No apparent distress alert and oriented x3 mood and affect normal, dressed appropriately.  Overweight HEENT: Pupils equal, extraocular movements intact  Respiratory: Patient's speak in full sentences and does not appear short of breath  Cardiovascular: No lower extremity edema, non tender, no erythema  Neck exam does have some loss of lordosis.  Some tenderness to palpation of the paraspinal musculature negative Spurling's test noted today though.  Does have tightness noted in the parascapular region bilaterally. Low back exam does have tightness noted with FABER test as well.  Osteopathic findings  C5 flexed rotated and side bent left C7 flexed rotated and side bent right T4 extended rotated and side bent right inhaled rib L5 flexed rotated and side bent left Sacrum right on right       Assessment and Plan:  Cervical radiculitis Stable at the moment.  Patient noted to have chronic condition with exacerbation noted as well.  We discussed the gabapentin.  Discussed home exercises and posture and ergonomics.  Responding well to osteopathic manipulation.  Follow-up again in 6 to 8 weeks    Nonallopathic problems  Decision today to  treat with OMT was based on Physical Exam  After verbal consent patient was treated with HVLA, ME, FPR techniques in cervical, rib, thoracic, lumbar, and sacral  areas  Patient tolerated the procedure well with improvement in symptoms  Patient given exercises, stretches and lifestyle modifications  See medications in patient instructions if given  Patient will follow up in  4-8 weeks      The above documentation has been reviewed and is accurate and complete [name], DO        Note: This dictation was prepared with Dragon dictation along with smaller phrase technology. Any transcriptional errors that result from this process are unintentional.

## 2021-04-29 ENCOUNTER — Encounter: Payer: Self-pay | Admitting: Family Medicine

## 2021-04-29 ENCOUNTER — Other Ambulatory Visit: Payer: Self-pay

## 2021-04-29 ENCOUNTER — Ambulatory Visit (INDEPENDENT_AMBULATORY_CARE_PROVIDER_SITE_OTHER): Payer: No Typology Code available for payment source | Admitting: Family Medicine

## 2021-04-29 VITALS — BP 122/76 | HR 84 | Ht 62.0 in | Wt 271.0 lb

## 2021-04-29 DIAGNOSIS — M9908 Segmental and somatic dysfunction of rib cage: Secondary | ICD-10-CM | POA: Diagnosis not present

## 2021-04-29 DIAGNOSIS — M5412 Radiculopathy, cervical region: Secondary | ICD-10-CM

## 2021-04-29 DIAGNOSIS — M9904 Segmental and somatic dysfunction of sacral region: Secondary | ICD-10-CM

## 2021-04-29 DIAGNOSIS — M9903 Segmental and somatic dysfunction of lumbar region: Secondary | ICD-10-CM | POA: Diagnosis not present

## 2021-04-29 DIAGNOSIS — M9901 Segmental and somatic dysfunction of cervical region: Secondary | ICD-10-CM

## 2021-04-29 DIAGNOSIS — M9902 Segmental and somatic dysfunction of thoracic region: Secondary | ICD-10-CM | POA: Diagnosis not present

## 2021-04-29 NOTE — Patient Instructions (Signed)
Great to see you Will be sore tmrw Work on posture See me in 6-8 weeks

## 2021-04-29 NOTE — Assessment & Plan Note (Signed)
Stable at the moment.  Patient noted to have chronic condition with exacerbation noted as well.  We discussed the gabapentin.  Discussed home exercises and posture and ergonomics.  Responding well to osteopathic manipulation.  Follow-up again in 6 to 8 weeks

## 2021-06-08 NOTE — Progress Notes (Signed)
[name] [address] [place] [address] Phone: [phone] Subjective:   [name], am serving as a scribe for Dr. Antoine Primas.This visit occurred during the SARS-CoV-2 public health emergency.  Safety protocols were in place, including screening questions prior to the visit, additional usage of staff PPE, and extensive cleaning of exam room while observing appropriate contact time as indicated for disinfecting solutions.  I'm seeing this patient by the request  of:  Sherlene Shams, MD  CC: Patient does have back and neck.  IRS:[id number]  EFRATA BRUNNER is a 46 y.o. female coming in with complaint of back and neck pain. OMT 04/29/2021. Patient states that she is having pain that radiates down the L leg. Felt adjustment of L side specifically helped last visit.   Also notes a couple days in a row of R forearm throbbing pain.   Medications patient has been prescribed: None  Taking:         Reviewed prior external information including notes and imaging from previsou exam, outside providers and external EMR if available.   As well as notes that were available from care everywhere and other healthcare systems.  Past medical history, social, surgical and family history all reviewed in electronic medical record.  No pertanent information unless stated regarding to the chief complaint.   Past Medical History:  Diagnosis Date   Bronchitis    hx of   Cervical radiculitis 03/03/2020   Complication of anesthesia    COVID-19 10/2020   DDD (degenerative disc disease), cervical 02/28/2020   Dysrhythmia    "does not see cardiologist"   Family history of anesthesia complication    "morphine night terrors"   GERD (gastroesophageal reflux disease)    Headache(784.0)    "migraines"   Hypertension    PONV (postoperative nausea and vomiting)    Pre-syncope 07/14/2014   Shortness of breath    "wheezing"   Weakness of left arm  03/03/2020    Allergies  Allergen Reactions   Latex Hives    Rash and hives     Review of Systems:  No headache, visual changes, nausea, vomiting, diarrhea, constipation, dizziness, abdominal pain, skin rash, fevers, chills, night sweats, weight loss, swollen lymph nodes, body aches, joint swelling, chest pain, shortness of breath, mood changes. POSITIVE muscle aches  Objective  Blood pressure 124/84, pulse 80, height 5\' 2"  (1.575 m), weight 262 lb (118.8 kg), SpO2 99 %.   General: No apparent distress alert and oriented x3 mood and affect normal, dressed appropriately.  HEENT: Pupils equal, extraocular movements intact  Respiratory: Patient's speak in full sentences and does not appear short of breath  Cardiovascular: No lower extremity edema, non tender, no erythema  Gait normal with good balance and coordination.  MSK:  Non tender with full range of motion and good stability and symmetric strength and tone of shoulders, elbows, wrist, hip, knee and ankles bilaterally.  Back -low back exam does have some loss of lordosis.  Tightness noted in the piriformis or lower left greater than right.  Patient does still have some mild limited internal rotation of the hip and does have tightness with FABER test.  Osteopathic findings  C2 flexed rotated and side bent right C6 flexed rotated and side bent left T3 extended rotated and side bent right inhaled rib T9 extended rotated and side bent left L2 flexed rotated and side bent right Sacrum left on left       Assessment  and Plan:  Low back pain without sciatica Chronic, with mild exacerbation.  Is responding well to the osteopathic manipulation.  We discussed different over-the-counter medications, does have the Zanaflex as needed.  We discussed different sleeping positions that could be helpful.  Responding well to manipulation.  Follow-up with me again in 6 to 8 weeks.   Nonallopathic problems  Decision today to treat with OMT was  based on Physical Exam  After verbal consent patient was treated with HVLA, ME, FPR techniques in cervical, rib, thoracic, lumbar, and sacral  areas  Patient tolerated the procedure well with improvement in symptoms  Patient given exercises, stretches and lifestyle modifications  See medications in patient instructions if given  Patient will follow up in 4-8 weeks     The above documentation has been reviewed and is accurate and complete [name], DO        Note: This dictation was prepared with Dragon dictation along with smaller phrase technology. Any transcriptional errors that result from this process are unintentional.

## 2021-06-10 ENCOUNTER — Encounter: Payer: Self-pay | Admitting: Family Medicine

## 2021-06-10 ENCOUNTER — Ambulatory Visit (INDEPENDENT_AMBULATORY_CARE_PROVIDER_SITE_OTHER): Payer: No Typology Code available for payment source | Admitting: Family Medicine

## 2021-06-10 ENCOUNTER — Other Ambulatory Visit: Payer: Self-pay

## 2021-06-10 VITALS — BP 124/84 | HR 80 | Ht 62.0 in | Wt 262.0 lb

## 2021-06-10 DIAGNOSIS — M9901 Segmental and somatic dysfunction of cervical region: Secondary | ICD-10-CM

## 2021-06-10 DIAGNOSIS — M9902 Segmental and somatic dysfunction of thoracic region: Secondary | ICD-10-CM

## 2021-06-10 DIAGNOSIS — M545 Low back pain, unspecified: Secondary | ICD-10-CM | POA: Diagnosis not present

## 2021-06-10 DIAGNOSIS — M9904 Segmental and somatic dysfunction of sacral region: Secondary | ICD-10-CM | POA: Diagnosis not present

## 2021-06-10 DIAGNOSIS — M9903 Segmental and somatic dysfunction of lumbar region: Secondary | ICD-10-CM | POA: Diagnosis not present

## 2021-06-10 DIAGNOSIS — M9908 Segmental and somatic dysfunction of rib cage: Secondary | ICD-10-CM

## 2021-06-10 MED ORDER — AMBULATORY NON FORMULARY MEDICATION: 1.0000 [IU] | Freq: Once | 0 refills | Status: AC

## 2021-06-10 NOTE — Patient Instructions (Signed)
Keep doing exercises Try pillow between knees with sleeping See me in 5-6 weeks

## 2021-06-10 NOTE — Assessment & Plan Note (Signed)
Chronic, with mild exacerbation.  Is responding well to the osteopathic manipulation.  We discussed different over-the-counter medications, does have the Zanaflex as needed.  We discussed different sleeping positions that could be helpful.  Responding well to manipulation.  Follow-up with me again in 6 to 8 weeks.

## 2021-06-15 LAB — HM DIABETES EYE EXAM

## 2021-06-17 ENCOUNTER — Other Ambulatory Visit (INDEPENDENT_AMBULATORY_CARE_PROVIDER_SITE_OTHER): Payer: No Typology Code available for payment source

## 2021-06-17 ENCOUNTER — Other Ambulatory Visit: Payer: Self-pay

## 2021-06-17 DIAGNOSIS — E119 Type 2 diabetes mellitus without complications: Secondary | ICD-10-CM

## 2021-06-17 LAB — COMPREHENSIVE METABOLIC PANEL
ALT: 29 U/L (ref 0–35)
AST: 21 U/L (ref 0–37)
Albumin: 4.4 g/dL (ref 3.5–5.2)
Alkaline Phosphatase: 56 U/L (ref 39–117)
BUN: 11 mg/dL (ref 6–23)
CO2: 27 mEq/L (ref 19–32)
Calcium: 9.5 mg/dL (ref 8.4–10.5)
Chloride: 104 mEq/L (ref 96–112)
Creatinine, Ser: 0.81 mg/dL (ref 0.40–1.20)
GFR: 88.27 mL/min (ref >60.00)
Glucose, Bld: 117 mg/dL — ABNORMAL HIGH (ref 70–99)
Potassium: 4.1 mEq/L (ref 3.5–5.1)
Sodium: 139 mEq/L (ref 135–145)
Total Bilirubin: 0.8 mg/dL (ref 0.2–1.2)
Total Protein: 7 g/dL (ref 6.0–8.3)

## 2021-06-17 LAB — LIPID PANEL
Cholesterol: 157 mg/dL (ref 0–200)
HDL: 36 mg/dL — ABNORMAL LOW (ref >39.00)
NonHDL: 120.98
Total CHOL/HDL Ratio: 4
Triglycerides: 208 mg/dL — ABNORMAL HIGH (ref 0.0–149.0)
VLDL: 41.6 mg/dL — ABNORMAL HIGH (ref 0.0–40.0)

## 2021-06-17 LAB — MICROALBUMIN / CREATININE URINE RATIO
Creatinine,U: 68.5 mg/dL
Microalb Creat Ratio: 1 mg/g (ref 0.0–30.0)
Microalb, Ur: <0.7 mg/dL (ref 0.0–1.9)

## 2021-06-17 LAB — LDL CHOLESTEROL, DIRECT: Direct LDL: 100 mg/dL

## 2021-06-21 ENCOUNTER — Other Ambulatory Visit (HOSPITAL_COMMUNITY)
Admission: RE | Admit: 2021-06-21 | Discharge: 2021-06-21 | Disposition: A | Discharge status: Home / Self Care | Payer: No Typology Code available for payment source | Source: Ambulatory Visit | Attending: Internal Medicine | Admitting: Internal Medicine

## 2021-06-21 ENCOUNTER — Ambulatory Visit (INDEPENDENT_AMBULATORY_CARE_PROVIDER_SITE_OTHER): Payer: No Typology Code available for payment source | Admitting: Internal Medicine

## 2021-06-21 ENCOUNTER — Encounter: Payer: Self-pay | Admitting: Internal Medicine

## 2021-06-21 ENCOUNTER — Other Ambulatory Visit: Payer: Self-pay

## 2021-06-21 VITALS — BP 116/64 | HR 68 | Temp 97.9°F | Ht 62.0 in | Wt 259.8 lb

## 2021-06-21 DIAGNOSIS — Z0001 Encounter for general adult medical examination with abnormal findings: Secondary | ICD-10-CM

## 2021-06-21 DIAGNOSIS — E785 Hyperlipidemia, unspecified: Secondary | ICD-10-CM | POA: Diagnosis not present

## 2021-06-21 DIAGNOSIS — E1169 Type 2 diabetes mellitus with other specified complication: Secondary | ICD-10-CM

## 2021-06-21 DIAGNOSIS — R030 Elevated blood-pressure reading, without diagnosis of hypertension: Secondary | ICD-10-CM

## 2021-06-21 DIAGNOSIS — Z1231 Encounter for screening mammogram for malignant neoplasm of breast: Secondary | ICD-10-CM

## 2021-06-21 DIAGNOSIS — Z124 Encounter for screening for malignant neoplasm of cervix: Secondary | ICD-10-CM | POA: Diagnosis not present

## 2021-06-21 MED ORDER — SEMAGLUTIDE (2 MG/DOSE) 8 MG/3ML ~~LOC~~ SOPN
2.0000 mg | PEN_INJECTOR | SUBCUTANEOUS | 1 refills | Status: DC
  Filled 2021-06-21 – 2021-07-06 (×4): qty 3, 28d supply, fill #0

## 2021-06-21 NOTE — Assessment & Plan Note (Signed)
Readings have normalized with weight loss

## 2021-06-21 NOTE — Assessment & Plan Note (Signed)
Untreated despite diabetes diagnosis due to personal preference and low risk. Will treat when risk is > 8%  Lab Results  Component Value Date   CHOL 157 06/17/2021   HDL 36.00 (L) 06/17/2021   LDLCALC 107 (H) 07/08/2020   LDLDIRECT 100.0 06/17/2021   TRIG 208.0 (H) 06/17/2021   CHOLHDL 4 06/17/2021

## 2021-06-21 NOTE — Assessment & Plan Note (Addendum)
She has lost 38 lbs since start of Ozempic in late August.  She continues to lack motivation to exercise.  counselling given

## 2021-06-21 NOTE — Patient Instructions (Signed)
Continue ozempic at the 1.5 mg dose for a total of 4 weeks and increase the dose to 2 mg if your weight has plateaued  YOU MUST START EXERCISING!   We will repeat your a1c and CMET in 3 months.

## 2021-06-21 NOTE — Assessment & Plan Note (Signed)
PAP smear was attempted but difficult to visualize cervix due to body habitus and long vaginal vault.  age appropriate education and counseling updated, referrals for preventative services and immunizations addressed, dietary and smoking counseling addressed, most recent labs reviewed.  I have personally reviewed and have noted:   1) the patient's medical and social history 2) The pt's use of alcohol, tobacco, and illicit drugs 3) The patient's current medications and supplements 4) Functional ability including ADL's, fall risk, home safety risk, hearing and visual impairment 5) Diet and physical activities 6) Evidence for depression or mood disorder 7) The patient's height, weight, and BMI have been recorded in the chart 8) I have ordered and reviewed a 12 lead EKG and find that there are no acute changes and patient is in sinus rhythm.     I have made referrals, and provided counseling and education based on review of the above

## 2021-06-21 NOTE — Progress Notes (Signed)
The patient is here for annual preventive examination and management of other chronic and acute problems.   This visit occurred during the SARS-CoV-2 public health emergency.  Safety protocols were in place, including screening questions prior to the visit, additional usage of staff PPE, and extensive cleaning of exam room while observing appropriate contact time as indicated for disinfecting solutions.     The risk factors are reflected in the social history.   The roster of all physicians providing medical care to patient - is listed in the Snapshot section of the chart.   Activities of daily living:  The patient is 100% independent in all ADLs: dressing, toileting, feeding as well as independent mobility   Home safety : The patient has smoke detectors in the home. They wear seatbelts.  There are no unsecured firearms at home. There is no violence in the home.    There is no risks for hepatitis, STDs or HIV. There is no   history of blood transfusion. They have no travel history to infectious disease endemic areas of the world.   The patient has seen their dentist in the last six month. They have seen their eye doctor in the last year. The patinet  denies slight hearing difficulty with regard to whispered voices and some television programs.  They have deferred audiologic testing in the last year.  They do not  have excessive sun exposure. Discussed the need for sun protection: hats, long sleeves and use of sunscreen if there is significant sun exposure.    Diet: the importance of a healthy diet is discussed. She is eating a lot of [place] yogurt and fruit due to diminished appetite. Has not tried a low carb protein shakes .   The benefits of regular aerobic exercise were discussed. The patient  is NOT EXERCISING  and lacks motivation     Depression screen: there are no signs or vegative symptoms of depression- irritability, change in appetite, anhedonia, sadness/tearfullness.   The following  portions of the patient's history were reviewed and updated as appropriate: allergies, current medications, past family history, past medical history,  past surgical history, past social history  and problem list.   Visual acuity was not assessed per patient preference since the patient has regular follow up with an  ophthalmologist. Hearing and body mass index were assessed and reviewed.    During the course of the visit the patient was educated and counseled about appropriate screening and preventive services including : fall prevention , diabetes screening, nutrition counseling, colorectal cancer screening, and recommended immunizations.    Chief Complaint:  HPI     Annual Exam    Additional comments: Physical with a pap smear      Last edited by Adair Laundry, [place] on 06/21/2021  8:29 AM.      1) obesity/DM/HTN: taking ozempic , currently at the 1.5mg  dose,  weight tends to plateau, then drop a little.   No nausea.. not exercising   Review of Symptoms  Patient denies headache, fevers, malaise, unintentional weight loss, skin rash, eye pain, sinus congestion and sinus pain, sore throat, dysphagia,  hemoptysis , cough, dyspnea, wheezing, chest pain, palpitations, orthopnea, edema, abdominal pain, nausea, melena, diarrhea, constipation, flank pain, dysuria, hematuria, urinary  Frequency, nocturia, numbness, tingling, seizures,  Focal weakness, Loss of consciousness,  Tremor, insomnia, depression, anxiety, and suicidal ideation.    Physical Exam:  BP 116/64 (BP Location: Left Arm, Patient Position: Sitting, Cuff Size: Large)    Pulse 68  Temp 97.9 F (36.6 C) (Oral)    Ht 5\' 2"  (1.575 m)    Wt 259 lb 12.8 oz (117.8 kg)    SpO2 99%    BMI 47.52 kg/m     General Appearance:    Alert, cooperative, no distress, appears stated age  Head:    Normocephalic, without obvious abnormality, atraumatic  Eyes:    PERRL, conjunctiva/corneas clear, EOM's intact, fundi    benign, both eyes   Ears:    Normal TM's and external ear canals, both ears  Nose:   Nares normal, septum midline, mucosa normal, no drainage    or sinus tenderness  Throat:   Lips, mucosa, and tongue normal; teeth and gums normal  Neck:   Supple, symmetrical, trachea midline, no adenopathy;    thyroid:  no enlargement/tenderness/nodules; no carotid   bruit or JVD  Back:     Symmetric, no curvature, ROM normal, no CVA tenderness  Lungs:     Clear to auscultation bilaterally, respirations unlabored  Chest Wall:    No tenderness or deformity   Heart:    Regular rate and rhythm, S1 and S2 normal, no murmur, rub   or gallop  Breast Exam:    No tenderness, masses, or nipple abnormality  Abdomen:     Soft, non-tender, bowel sounds active all four quadrants,    no masses, no organomegaly  Genitalia:    Pelvic: cervix normal in appearance, external genitalia normal, no adnexal masses or tenderness, no cervical motion tenderness, rectovaginal septum normal, uterus normal size, shape, and consistency and vagina normal without discharge  Extremities:   Extremities normal, atraumatic, no cyanosis or edema  Pulses:   2+ and symmetric all extremities  Skin:   Skin color, texture, turgor normal, no rashes or lesions  Lymph nodes:   Cervical, supraclavicular, and axillary nodes normal  Neurologic:   CNII-XII intact, normal strength, sensation and reflexes    throughout      Assessment and Plan:  Morbid obesity ([place]) She has lost 38 lbs since start of Ozempic in late August.  She continues to lack motivation to exercise.  counselling given   Elevated blood pressure reading in office without diagnosis of hypertension Readings have normalized with weight loss   Encounter for general adult medical examination with abnormal findings PAP smear was attempted but difficult to visualize cervix due to body habitus and long vaginal vault.  age appropriate education and counseling updated, referrals for preventative services  and immunizations addressed, dietary and smoking counseling addressed, most recent labs reviewed.  I have personally reviewed and have noted:   1) the patient's medical and social history 2) The pt's use of alcohol, tobacco, and illicit drugs 3) The patient's current medications and supplements 4) Functional ability including ADL's, fall risk, home safety risk, hearing and visual impairment 5) Diet and physical activities 6) Evidence for depression or mood disorder 7) The patient's height, weight, and BMI have been recorded in the chart 8) I have ordered and reviewed a 12 lead EKG and find that there are no acute changes and patient is in sinus rhythm.     I have made referrals, and provided counseling and education based on review of the above  Hyperlipidemia associated with type 2 diabetes mellitus ([place]) Untreated despite diabetes diagnosis due to personal preference and low risk. Will treat when risk is > 8%  Lab Results  Component Value Date   CHOL 157 06/17/2021   HDL 36.00 (L) 06/17/2021  LDLCALC 107 (H) 07/08/2020   LDLDIRECT 100.0 06/17/2021   TRIG 208.0 (H) 06/17/2021   CHOLHDL 4 06/17/2021     Updated Medication List Outpatient Encounter Medications as of 06/21/2021  Medication Sig   acetaminophen (TYLENOL) 500 MG tablet Take 1,000 mg by mouth every 6 (six) hours as needed.   cetirizine (ZYRTEC) 10 MG tablet Take 10 mg by mouth 2 (two) times daily.    cholecalciferol (VITAMIN D3) 25 MCG (1000 UNIT) tablet Take 1,000 Units by mouth daily.   cyanocobalamin (,VITAMIN B-12,) 1000 MCG/ML injection INJECT 1 ML INTO THE MUSCLE ONCE A WEEK FOR 3 WEEKS, THEN MONTHLY THEREAFTER   DENAVIR 1 % cream APPLY TOPICALLY EVERY TWO HOURS FOR 4 DAYS AS NEEDED FOR COLD SORES   etonogestrel (NEXPLANON) 68 MG IMPL implant 1 each by Subdermal route once.   Insulin Pen Needle (PEN NEEDLES) 32G X 4 MM MISC Use weekly with ozempic pen   Misc Natural Products (TART CHERRY ADVANCED PO) Take 1  capsule by mouth daily.   omeprazole (PRILOSEC) 40 MG capsule TAKE 1 CAPSULE BY MOUTH DAILY.   Semaglutide, 2 MG/DOSE, 8 MG/3ML SOPN Inject 2 mg as directed once a week.   Turmeric 400 MG CAPS Take 400 mg by mouth daily.    [DISCONTINUED] Semaglutide, 2 MG/DOSE, 8 MG/3ML SOPN Inject 0.25 mg as directed once a week.   fluticasone (FLONASE) 50 MCG/ACT nasal spray PLACE 2 SPRAYS INTO BOTH NOSTRILS DAILY.   gabapentin (NEURONTIN) 100 MG capsule TAKE 2 CAPSULES (200 MG) BY MOUTH AT BEDTIME.   gabapentin (NEURONTIN) 300 MG capsule TAKE 1 CAPSULE BY MOUTH AT BEDTIME.   [DISCONTINUED] ALPRAZolam (XANAX) 0.25 MG tablet Take 1 tablet (0.25 mg total) by mouth 2 (two) times daily as needed for anxiety. (Patient not taking: Reported on 06/21/2021)   [DISCONTINUED] lidocaine-nystatin-diphenhydrAMINE-hydrocortisone swish and spit 1-2 teaspoonfuls by mouth 3 to 4 times a day as needed for mouth sores (Patient not taking: Reported on 06/21/2021)   [DISCONTINUED] scopolamine (TRANSDERM-SCOP, 1.5 MG,) 1 MG/3DAYS Place 1 patch (1.5 mg total) onto the skin every 3 (three) days. (Patient not taking: Reported on 06/21/2021)   No facility-administered encounter medications on file as of 06/21/2021.

## 2021-06-22 LAB — CYTOLOGY - PAP
Adequacy: ABSENT
Comment: NEGATIVE
Diagnosis: NEGATIVE
High risk HPV: NEGATIVE

## 2021-06-25 ENCOUNTER — Encounter: Payer: Self-pay | Admitting: Family Medicine

## 2021-06-25 ENCOUNTER — Other Ambulatory Visit: Payer: Self-pay

## 2021-06-25 ENCOUNTER — Telehealth: Payer: Self-pay | Admitting: Internal Medicine

## 2021-06-25 MED ORDER — WEGOVY 0.25 MG/0.5ML ~~LOC~~ SOAJ
0.2500 mg | SUBCUTANEOUS | 1 refills | Status: DC
  Filled 2021-06-25: qty 0.5, fill #0

## 2021-06-25 MED ORDER — SEMAGLUTIDE-WEIGHT MANAGEMENT 2.4 MG/0.75ML ~~LOC~~ SOAJ
2.4000 mg | SUBCUTANEOUS | 0 refills | Status: DC
  Filled 2021-06-25 – 2021-08-02 (×12): qty 3, 28d supply, fill #0

## 2021-06-25 NOTE — Telephone Encounter (Signed)
Patient is waiting for her Ozempic and wondered if she could get a sample until hers comes in.

## 2021-06-25 NOTE — Telephone Encounter (Signed)
Spoke with pt and she stated that she spoke with the pharmacy, they told her that insurance would not cover the ozempic. They will cover the [facility] once the PA has been submitted. Pt is wanting to know if Mancel Parsons can be sent to [place].

## 2021-06-25 NOTE — Telephone Encounter (Signed)
Patient called in returning call to [facility], Please call patient back

## 2021-06-25 NOTE — Addendum Note (Signed)
Addended by: Sherlene Shams on: 06/25/2021 05:09 PM   Modules accepted: Orders

## 2021-06-25 NOTE — Telephone Encounter (Signed)
Wegovy has been sent to [facility] but the wrong dose was sent .  I will resend

## 2021-06-25 NOTE — Telephone Encounter (Signed)
LMTCB

## 2021-06-25 NOTE — Telephone Encounter (Signed)
PA has been submitted waiting on a response from insurance. Pt is wanting to know if she can get a sample.

## 2021-06-28 ENCOUNTER — Other Ambulatory Visit: Payer: Self-pay

## 2021-06-29 ENCOUNTER — Encounter: Payer: Self-pay | Admitting: Internal Medicine

## 2021-06-29 ENCOUNTER — Other Ambulatory Visit: Payer: Self-pay

## 2021-06-30 ENCOUNTER — Other Ambulatory Visit: Payer: Self-pay

## 2021-07-02 ENCOUNTER — Other Ambulatory Visit: Payer: Self-pay

## 2021-07-02 NOTE — Telephone Encounter (Signed)
Patient calling back in and states that insurance still not have completed the authorization for the Ozempic or Wegovy. States these are still pending.   Patient wondering if she can get a sample of either medication as she will have been out of medicine for a week.   Please advise

## 2021-07-04 ENCOUNTER — Encounter: Payer: Self-pay | Admitting: Family Medicine

## 2021-07-05 ENCOUNTER — Other Ambulatory Visit: Payer: Self-pay | Admitting: Internal Medicine

## 2021-07-05 ENCOUNTER — Other Ambulatory Visit: Payer: Self-pay

## 2021-07-05 ENCOUNTER — Telehealth: Payer: Self-pay | Admitting: Internal Medicine

## 2021-07-05 NOTE — Telephone Encounter (Signed)
Spoke with pt to let her know that the PA for Mancel Parsons is still in process. I followed up on it again today. Pt stated that the pharmacy called her today and stated that she has one more month of approved Ozempic. Pt is wanting to know if she should continue with the Ozempic until the [facility] PA gets handled.

## 2021-07-05 NOTE — Telephone Encounter (Signed)
Patient called and said she was returning [place] phone call. Patient was not sure what the call was about.

## 2021-07-05 NOTE — Telephone Encounter (Signed)
LMTCB

## 2021-07-06 ENCOUNTER — Other Ambulatory Visit: Payer: Self-pay

## 2021-07-06 NOTE — Telephone Encounter (Signed)
See telephone encounter.

## 2021-07-06 NOTE — Telephone Encounter (Signed)
Patient is aware that it is okay to go ahead and fill the ozempic until we can get the [facility] PA completed.

## 2021-07-07 ENCOUNTER — Other Ambulatory Visit: Payer: Self-pay

## 2021-07-07 MED ORDER — SEMAGLUTIDE-WEIGHT MANAGEMENT 1.7 MG/0.75ML ~~LOC~~ SOAJ
SUBCUTANEOUS | 0 refills | Status: DC
  Filled 2021-07-07: qty 3, 28d supply, fill #0

## 2021-07-07 NOTE — Telephone Encounter (Signed)
Medicaiton has been approved though 10/01/2021. Pt is aware as well as pharmacy.

## 2021-07-08 ENCOUNTER — Other Ambulatory Visit: Payer: Self-pay

## 2021-07-15 ENCOUNTER — Ambulatory Visit: Payer: No Typology Code available for payment source | Admitting: Family Medicine

## 2021-07-20 ENCOUNTER — Other Ambulatory Visit: Payer: Self-pay

## 2021-07-31 MED FILL — Omeprazole Cap Delayed Release 40 MG: ORAL | 90 days supply | Qty: 90 | Fill #1 | Status: AC

## 2021-08-02 ENCOUNTER — Other Ambulatory Visit: Payer: Self-pay

## 2021-08-16 ENCOUNTER — Other Ambulatory Visit: Payer: Self-pay | Admitting: Internal Medicine

## 2021-08-16 ENCOUNTER — Other Ambulatory Visit: Payer: Self-pay

## 2021-08-16 MED ORDER — WEGOVY 2.4 MG/0.75ML ~~LOC~~ SOAJ
2.4000 mg | SUBCUTANEOUS | 0 refills | Status: DC
Start: 2021-10-19 — End: 2021-09-13
  Filled 2021-08-16 – 2021-08-24 (×2): qty 3, 28d supply, fill #0

## 2021-08-18 NOTE — Progress Notes (Signed)
?Charlann Boxer D.O. ?[facility] ?[place] ?Phone: [phone] ?Subjective:   ?I, Lisa [name], am serving as a scribe for Dr. Hulan Saas. ? ?This visit occurred during the SARS-CoV-2 public health emergency.  Safety protocols were in place, including screening questions prior to the visit, additional usage of staff PPE, and extensive cleaning of exam room while observing appropriate contact time as indicated for disinfecting solutions.  ?I'm seeing this patient by the request  of:  Crecencio Mc, MD ? ?CC: Neck and back pain follow-up ? ?[id number]  ?Lisa [name] is a 45 y.o. female coming in with complaint of back and neck pain. OMT 06/10/2021. Patient states continues to have some discomfort and pain.  Patient states that if anything may be has improved more than usual.  He is taking gabapentin at night. ? ? ? ? ?  ? ? ? ? ?Reviewed prior external information including notes and imaging from previsou exam, outside providers and external EMR if available.  ? ?As well as notes that were available from care everywhere and other healthcare systems. ? ?Past medical history, social, surgical and family history all reviewed in electronic medical record.  No pertanent information unless stated regarding to the chief complaint.  ? ?Past Medical History:  ?Diagnosis Date  ? Bronchitis   ? hx of  ? Cervical radiculitis 03/03/2020  ? Complication of anesthesia   ? COVID-19 10/2020  ? DDD (degenerative disc disease), cervical 02/28/2020  ? Dysrhythmia   ? "does not see cardiologist"  ? Family history of anesthesia complication   ? "morphine night terrors"  ? GERD (gastroesophageal reflux disease)   ? Headache(784.0)   ? "migraines"  ? Hypertension   ? PONV (postoperative nausea and vomiting)   ? Pre-syncope 07/14/2014  ? Shortness of breath   ? "wheezing"  ? Weakness of left arm 03/03/2020  ?  ?Allergies  ?Allergen Reactions  ? Latex Hives  ?  Rash and hives  ? ? ? ?Review  of Systems: ? No headache, visual changes, nausea, vomiting, diarrhea, constipation, dizziness, abdominal pain, skin rash, fevers, chills, night sweats, weight loss, swollen lymph nodes, body aches, joint swelling, chest pain, shortness of breath, mood changes. POSITIVE muscle aches ? ?Objective  ?Blood pressure 110/80, pulse 89, height 5\' 2"  (1.575 m), weight 250 lb (113.4 kg), SpO2 99 %. ?  ?General: No apparent distress alert and oriented x3 mood and affect normal, dressed appropriately.  ?HEENT: Pupils equal, extraocular movements intact  ?Respiratory: Patient's speak in full sentences and does not appear short of breath  ?Cardiovascular: No lower extremity edema, non tender, no erythema  ?Neck exam does have some loss lordosis.  Some tenderness to palpation in the paraspinal musculature.  Patient does have tightness noted in the parascapular region right greater than left.  Low back exam about the same as previously.  Neck exam has a negative Spurling's today. ? ?Osteopathic findings ? ?C2 flexed rotated and side bent right ?C6 flexed rotated and side bent left ?T3 extended rotated and side bent right inhaled rib ?T9 extended rotated and side bent left ?L2 flexed rotated and side bent right ?Sacrum right on right ? ? ? ? ?  ?Assessment and Plan: ? ?Low back pain without sciatica ?Patient is doing a little bit better overall.  Still has some core stability issues noted.  Discussed icing regimen and home exercises.  Patient will increase activity a little slower.  Continue with the medications.  Encouraged her with the weight loss.  Follow-up again in 6 to 8 weeks ?  ? ?Nonallopathic problems ? ?Decision today to treat with OMT was based on Physical Exam ? ?After verbal consent patient was treated with HVLA, ME, FPR techniques in cervical, rib, thoracic, lumbar, and sacral  areas ? ?Patient tolerated the procedure well with improvement in symptoms ? ?Patient given exercises, stretches and lifestyle  modifications ? ?See medications in patient instructions if given ? ?Patient will follow up in 4-8 weeks ? ?  ? ? ?The above documentation has been reviewed and is accurate and complete Lyndal Pulley, DO ? ? ? ?  ? ? Note: This dictation was prepared with Dragon dictation along with smaller phrase technology. Any transcriptional errors that result from this process are unintentional.    ?  ?  ? ?

## 2021-08-19 ENCOUNTER — Encounter: Payer: Self-pay | Admitting: Family Medicine

## 2021-08-19 ENCOUNTER — Ambulatory Visit (INDEPENDENT_AMBULATORY_CARE_PROVIDER_SITE_OTHER): Payer: No Typology Code available for payment source | Admitting: Family Medicine

## 2021-08-19 VITALS — BP 110/80 | HR 89 | Ht 62.0 in | Wt 250.0 lb

## 2021-08-19 DIAGNOSIS — M9903 Segmental and somatic dysfunction of lumbar region: Secondary | ICD-10-CM

## 2021-08-19 DIAGNOSIS — M9904 Segmental and somatic dysfunction of sacral region: Secondary | ICD-10-CM

## 2021-08-19 DIAGNOSIS — M9901 Segmental and somatic dysfunction of cervical region: Secondary | ICD-10-CM

## 2021-08-19 DIAGNOSIS — M545 Low back pain, unspecified: Secondary | ICD-10-CM | POA: Diagnosis not present

## 2021-08-19 DIAGNOSIS — M9908 Segmental and somatic dysfunction of rib cage: Secondary | ICD-10-CM | POA: Diagnosis not present

## 2021-08-19 DIAGNOSIS — M9902 Segmental and somatic dysfunction of thoracic region: Secondary | ICD-10-CM | POA: Diagnosis not present

## 2021-08-19 NOTE — Assessment & Plan Note (Signed)
Patient is doing a little bit better overall.  Still has some core stability issues noted.  Discussed icing regimen and home exercises.  Patient will increase activity a little slower.  Continue with the medications.  Encouraged her with the weight loss.  Follow-up again in 6 to 8 weeks ?

## 2021-08-19 NOTE — Patient Instructions (Addendum)
Keep up exercise ?See me in 6-8 weeks ?

## 2021-08-24 ENCOUNTER — Other Ambulatory Visit: Payer: Self-pay

## 2021-09-13 ENCOUNTER — Encounter: Payer: Self-pay | Admitting: Internal Medicine

## 2021-09-13 ENCOUNTER — Other Ambulatory Visit: Payer: Self-pay

## 2021-09-13 ENCOUNTER — Other Ambulatory Visit: Payer: Self-pay | Admitting: Internal Medicine

## 2021-09-13 MED ORDER — WEGOVY 2.4 MG/0.75ML ~~LOC~~ SOAJ
2.4000 mg | SUBCUTANEOUS | 0 refills | Status: DC
  Filled 2021-09-13: qty 3, 28d supply, fill #0

## 2021-09-15 ENCOUNTER — Other Ambulatory Visit: Payer: Self-pay

## 2021-09-23 ENCOUNTER — Other Ambulatory Visit: Payer: Self-pay

## 2021-09-23 ENCOUNTER — Encounter: Payer: Self-pay | Admitting: Internal Medicine

## 2021-09-23 ENCOUNTER — Ambulatory Visit (INDEPENDENT_AMBULATORY_CARE_PROVIDER_SITE_OTHER): Payer: No Typology Code available for payment source | Admitting: Internal Medicine

## 2021-09-23 VITALS — BP 118/76 | HR 73 | Temp 98.4°F | Ht 62.0 in | Wt 244.0 lb

## 2021-09-23 DIAGNOSIS — R5383 Other fatigue: Secondary | ICD-10-CM

## 2021-09-23 DIAGNOSIS — E119 Type 2 diabetes mellitus without complications: Secondary | ICD-10-CM

## 2021-09-23 DIAGNOSIS — E1169 Type 2 diabetes mellitus with other specified complication: Secondary | ICD-10-CM

## 2021-09-23 DIAGNOSIS — E785 Hyperlipidemia, unspecified: Secondary | ICD-10-CM

## 2021-09-23 LAB — CBC WITH DIFFERENTIAL/PLATELET
Basophils Absolute: 0 10*3/uL (ref 0.0–0.1)
Basophils Relative: 0.6 % (ref 0.0–3.0)
Eosinophils Absolute: 0.1 10*3/uL (ref 0.0–0.7)
Eosinophils Relative: 1.3 % (ref 0.0–5.0)
HCT: 41.9 % (ref 36.0–46.0)
Hemoglobin: 13.7 g/dL (ref 12.0–15.0)
Lymphocytes Relative: 17.3 % (ref 12.0–46.0)
Lymphs Abs: 1.3 10*3/uL (ref 0.7–4.0)
MCHC: 32.6 g/dL (ref 30.0–36.0)
MCV: 85 fl (ref 78.0–100.0)
Monocytes Absolute: 0.4 10*3/uL (ref 0.1–1.0)
Monocytes Relative: 5.8 % (ref 3.0–12.0)
Neutro Abs: 5.6 10*3/uL (ref 1.4–7.7)
Neutrophils Relative %: 75 % (ref 43.0–77.0)
Platelets: 247 10*3/uL (ref 150.0–400.0)
RBC: 4.93 Mil/uL (ref 3.87–5.11)
RDW: 13.8 % (ref 11.5–15.5)
WBC: 7.4 10*3/uL (ref 4.0–10.5)

## 2021-09-23 LAB — COMPREHENSIVE METABOLIC PANEL
ALT: 21 U/L (ref 0–35)
AST: 15 U/L (ref 0–37)
Albumin: 4.2 g/dL (ref 3.5–5.2)
Alkaline Phosphatase: 53 U/L (ref 39–117)
BUN: 16 mg/dL (ref 6–23)
CO2: 23 mEq/L (ref 19–32)
Calcium: 9 mg/dL (ref 8.4–10.5)
Chloride: 106 mEq/L (ref 96–112)
Creatinine, Ser: 0.73 mg/dL (ref 0.40–1.20)
GFR: 99.81 mL/min (ref >60.00)
Glucose, Bld: 92 mg/dL (ref 70–99)
Potassium: 4.1 mEq/L (ref 3.5–5.1)
Sodium: 138 mEq/L (ref 135–145)
Total Bilirubin: 0.7 mg/dL (ref 0.2–1.2)
Total Protein: 6.7 g/dL (ref 6.0–8.3)

## 2021-09-23 LAB — LDL CHOLESTEROL, DIRECT: Direct LDL: 104 mg/dL

## 2021-09-23 LAB — LIPID PANEL
Cholesterol: 171 mg/dL (ref 0–200)
HDL: 38.8 mg/dL — ABNORMAL LOW (ref >39.00)
NonHDL: 132.65
Total CHOL/HDL Ratio: 4
Triglycerides: 204 mg/dL — ABNORMAL HIGH (ref 0.0–149.0)
VLDL: 40.8 mg/dL — ABNORMAL HIGH (ref 0.0–40.0)

## 2021-09-23 LAB — TSH: TSH: 2.55 u[IU]/mL (ref 0.35–5.50)

## 2021-09-23 LAB — HEMOGLOBIN A1C: Hgb A1c MFr Bld: 5.7 % (ref 4.6–6.5)

## 2021-09-23 MED ORDER — CYCLOBENZAPRINE HCL 10 MG PO TABS
10.0000 mg | ORAL_TABLET | Freq: Three times a day (TID) | ORAL | 1 refills | Status: DC | PRN
  Filled 2021-09-23: qty 90, 30d supply, fill #0
  Filled 2022-07-13: qty 90, 30d supply, fill #1

## 2021-09-23 MED ORDER — FOLIC ACID 1 MG PO TABS
1.0000 mg | ORAL_TABLET | Freq: Every day | ORAL | 1 refills | Status: AC
  Filled 2021-09-23: qty 90, 90d supply, fill #0

## 2021-09-23 NOTE — Progress Notes (Signed)
? ?Subjective:  ?Patient ID: Lisa [name], female    DOB: 1976-03-06  Age: 46 y.o. MRN: [id number] ? ?CC: The primary encounter diagnosis was Other fatigue. Diagnoses of Type 2 diabetes mellitus without complication, without long-term current use of insulin ([place]), Hyperlipidemia associated with type 2 diabetes mellitus ([place]), and Morbid obesity ([place]) were also pertinent to this visit. ? ? ?This visit occurred during the SARS-CoV-2 public health emergency.  Safety protocols were in place, including screening questions prior to the visit, additional usage of staff PPE, and extensive cleaning of exam room while obsrving appropriate contact time as indicated for disinfecting solutions.   ? ?HPI ?Lisa [name] presents for  ?Chief Complaint  ?Patient presents with  ? Follow-up  ?  3 month follow up on diabetes  ? ?1) T2DM:    taking wegovy currently at 2.4 mg weekly.  45 lb wt loss so far.  Not exercising . ? ?2) neck spasm left side .  Cervical spine MRI  reveiwed.  She is getting massage weekly.  Wears a 40 lb lead apron all day 4 days per week ? ?3) more fatigue than usual. Diffuse joint pain,  lots of allergy . Taking b12  ? ?Outpatient Medications Prior to Visit  ?Medication Sig Dispense Refill  ? acetaminophen (TYLENOL) 500 MG tablet Take 1,000 mg by mouth every 6 (six) hours as needed.    ? cetirizine (ZYRTEC) 10 MG tablet Take 10 mg by mouth 2 (two) times daily.     ? cholecalciferol (VITAMIN D3) 25 MCG (1000 UNIT) tablet Take 1,000 Units by mouth daily.    ? DENAVIR 1 % cream APPLY TOPICALLY EVERY TWO HOURS FOR 4 DAYS AS NEEDED FOR COLD SORES 5 g 0  ? etonogestrel (NEXPLANON) 68 MG IMPL implant 1 each by Subdermal route once.    ? fluticasone (FLONASE) 50 MCG/ACT nasal spray PLACE 2 SPRAYS INTO BOTH NOSTRILS DAILY. 48 g 1  ? gabapentin (NEURONTIN) 100 MG capsule TAKE 2 CAPSULES (200 MG) BY MOUTH AT BEDTIME. 180 capsule 3  ? Insulin Pen Needle (PEN NEEDLES) 32G X 4 MM MISC Use weekly with ozempic pen  100 each 0  ? Misc Natural Products (TART CHERRY ADVANCED PO) Take 1 capsule by mouth daily.    ? omeprazole (PRILOSEC) 40 MG capsule TAKE 1 CAPSULE BY MOUTH DAILY. 90 capsule 1  ? [START ON 10/19/2021] Semaglutide-Weight Management (WEGOVY) 2.4 MG/0.75ML SOAJ Inject 2.4 mg into the skin once a week for 28 days. 3 mL 0  ? Turmeric 400 MG CAPS Take 400 mg by mouth daily.     ? gabapentin (NEURONTIN) 300 MG capsule TAKE 1 CAPSULE BY MOUTH AT BEDTIME. 90 capsule 3  ? Semaglutide, 2 MG/DOSE, 8 MG/3ML SOPN Inject 2 mg as directed once a week. (Patient not taking: Reported on 09/23/2021) 3 mL 1  ? Semaglutide-Weight Management 1.7 MG/0.75ML SOAJ Inject 1.78m into the skin once a week for 28 days (Patient not taking: Reported on 09/23/2021) 3 mL 0  ? ?No facility-administered medications prior to visit.  ? ? ?Review of Systems; ? ?Patient denies headache, fevers, malaise, unintentional weight loss, skin rash, eye pain, sinus congestion and sinus pain, sore throat, dysphagia,  hemoptysis , cough, dyspnea, wheezing, chest pain, palpitations, orthopnea, edema, abdominal pain, nausea, melena, diarrhea, constipation, flank pain, dysuria, hematuria, urinary  Frequency, nocturia, numbness, tingling, seizures,  Focal weakness, Loss of consciousness,  Tremor, insomnia, depression, anxiety, and suicidal ideation.   ? ? ? ?Objective:  ?  BP 118/76 (BP Location: Left Arm, Patient Position: Sitting, Cuff Size: Large)   Pulse 73   Temp 98.4 ?F (36.9 ?C) (Oral)   Ht _0  (1.575 m)   Wt 244 lb (110.7 kg)   SpO2 98%   BMI 44.63 kg/m?  ? ?BP Readings from Last 3 Encounters:  ?09/23/21 118/76  ?08/19/21 110/80  ?06/21/21 116/64  ? ? ?Wt Readings from Last 3 Encounters:  ?09/23/21 244 lb (110.7 kg)  ?08/19/21 250 lb (113.4 kg)  ?06/21/21 259 lb 12.8 oz (117.8 kg)  ? ? ?General appearance: alert, cooperative and appears stated age ?Ears: normal TM's and external ear canals both ears ?Throat: lips, mucosa, and tongue normal; teeth and gums  normal ?Neck: no adenopathy, no carotid bruit, supple, symmetrical, trachea midline and thyroid not enlarged, symmetric, no tenderness/mass/nodules ?Back: symmetric, no curvature. ROM normal. No CVA tenderness. ?Lungs: clear to auscultation bilaterally ?Heart: regular rate and rhythm, S1, S2 normal, no murmur, click, rub or gallop ?Abdomen: soft, non-tender; bowel sounds normal; no masses,  no organomegaly ?Pulses: 2+ and symmetric ?Skin: Skin color, texture, turgor normal. No rashes or lesions ?Lymph nodes: Cervical, supraclavicular, and axillary nodes normal. ? ?Lab Results  ?Component Value Date  ? HGBA1C 5.7 09/23/2021  ? HGBA1C 6.2 04/13/2021  ? HGBA1C 6.9 (H) 01/07/2021  ? ? ?Lab Results  ?Component Value Date  ? CREATININE 0.73 09/23/2021  ? CREATININE 0.81 06/17/2021  ? CREATININE 0.78 04/13/2021  ? ? ?Lab Results  ?Component Value Date  ? WBC 7.4 09/23/2021  ? HGB 13.7 09/23/2021  ? HCT 41.9 09/23/2021  ? PLT 247.0 09/23/2021  ? GLUCOSE 92 09/23/2021  ? CHOL 171 09/23/2021  ? TRIG 204.0 (H) 09/23/2021  ? HDL 38.80 (L) 09/23/2021  ? LDLDIRECT 104.0 09/23/2021  ? LDLCALC 107 (H) 07/08/2020  ? ALT 21 09/23/2021  ? AST 15 09/23/2021  ? NA 138 09/23/2021  ? K 4.1 09/23/2021  ? CL 106 09/23/2021  ? CREATININE 0.73 09/23/2021  ? BUN 16 09/23/2021  ? CO2 23 09/23/2021  ? TSH 2.55 09/23/2021  ? HGBA1C 5.7 09/23/2021  ? MICROALBUR <0.7 06/17/2021  ? ? ?No results found. ? ?Assessment & Plan:  ? ?Problem List Items Addressed This Visit   ? ? Hyperlipidemia associated with type 2 diabetes mellitus ([place])  ?  Untreated despite diabetes diagnosis due to personal preference and low risk. Will treat when risk is > 8% ? ?Lab Results  ?Component Value Date  ? CHOL 171 09/23/2021  ? HDL 38.80 (L) 09/23/2021  ? LDLCALC 107 (H) 07/08/2020  ? LDLDIRECT 104.0 09/23/2021  ? TRIG 204.0 (H) 09/23/2021  ? CHOLHDL 4 09/23/2021  ? ? ?  ?  ? Relevant Orders  ? Lipid Profile (Completed)  ? Direct LDL (Completed)  ? Morbid obesity ([place])  ?   She has lost 40 lbs since start of Ozempic in late August.  She continues to lack motivation to exercise.  counselling given  ? ?  ?  ? Type 2 diabetes mellitus without complications ([place])  ?  Marked improvement  in a1c since starting ozempic. .continue at 2.4 mg weekly  ? ?Lab Results  ?Component Value Date  ? HGBA1C 5.7 09/23/2021  ? ?Lab Results  ?Component Value Date  ? MICROALBUR <0.7 06/17/2021  ? MICROALBUR <0.7 07/08/2020  ? ? ? ? ?  ?  ? Relevant Orders  ? HgB A1c (Completed)  ? Comp Met (CMET) (Completed)  ? ?Other Visit Diagnoses   ? ?  Other fatigue    -  Primary  ? Relevant Orders  ? CBC with Differential/Platelet (Completed)  ? TSH (Completed)  ? ?  ? ? ?Follow-up: Return in about 3 months (around 12/24/2021). ? ? ?Crecencio Mc, MD ?

## 2021-09-23 NOTE — Patient Instructions (Addendum)
Ok to supplement mg 200 to 250 of magnesium  daily  ? ?1 mg   folic acid  ? ? ?If your weight plateaus,  let me know and we will add metformin  ?

## 2021-09-25 NOTE — Assessment & Plan Note (Signed)
She has lost 40 lbs since start of Ozempic in late August.  She continues to lack motivation to exercise.  counselling given  ?

## 2021-09-25 NOTE — Assessment & Plan Note (Addendum)
Marked improvement  in a1c since starting ozempic. .continue at 2.4 mg weekly  ? ?Lab Results  ?Component Value Date  ? HGBA1C 5.7 09/23/2021  ? ?Lab Results  ?Component Value Date  ? MICROALBUR <0.7 06/17/2021  ? MICROALBUR <0.7 07/08/2020  ? ? ? ? ?

## 2021-09-25 NOTE — Assessment & Plan Note (Signed)
Untreated despite diabetes diagnosis due to personal preference and low risk. Will treat when risk is > 8% ? ?Lab Results  ?Component Value Date  ? CHOL 171 09/23/2021  ? HDL 38.80 (L) 09/23/2021  ? LDLCALC 107 (H) 07/08/2020  ? LDLDIRECT 104.0 09/23/2021  ? TRIG 204.0 (H) 09/23/2021  ? CHOLHDL 4 09/23/2021  ? ? ?

## 2021-10-01 ENCOUNTER — Ambulatory Visit: Payer: No Typology Code available for payment source | Admitting: Family Medicine

## 2021-10-05 ENCOUNTER — Telehealth: Payer: No Typology Code available for payment source | Admitting: Family Medicine

## 2021-10-05 ENCOUNTER — Other Ambulatory Visit: Payer: Self-pay

## 2021-10-05 ENCOUNTER — Ambulatory Visit: Payer: No Typology Code available for payment source | Admitting: Family Medicine

## 2021-10-05 DIAGNOSIS — R300 Dysuria: Secondary | ICD-10-CM

## 2021-10-05 MED ORDER — NITROFURANTOIN MONOHYD MACRO 100 MG PO CAPS
100.0000 mg | ORAL_CAPSULE | Freq: Two times a day (BID) | ORAL | 0 refills | Status: AC
  Filled 2021-10-05: qty 10, 5d supply, fill #0

## 2021-10-05 NOTE — Progress Notes (Signed)

## 2021-10-05 NOTE — Progress Notes (Unsigned)
[name] [address] [place] [address] Phone: [phone] Subjective:   Lisa [name], am serving as a scribe for Dr. Antoine Primas.  I'm seeing this patient by the request  of:  Sherlene Shams, MD  CC: Neck and back pain follow-up  [id number]  Lisa [name] is a 46 y.o. female coming in with complaint of back and neck pain. OMT 08/19/2021. Patient states same per usual. Last 3-4 weeks has been having nerve pain in arm and started taking gabapentin again. No new complaints.  Just seems to be worsening over the course of time.  Affecting daily activities  Medications patient has been prescribed: None  Taking:         Reviewed prior external information including notes and imaging from previsou exam, outside providers and external EMR if available.   As well as notes that were available from care everywhere and other healthcare systems.  Past medical history, social, surgical and family history all reviewed in electronic medical record.  No pertanent information unless stated regarding to the chief complaint.   Past Medical History:  Diagnosis Date   Bronchitis    hx of   Cervical radiculitis 03/03/2020   Complication of anesthesia    COVID-19 10/2020   DDD (degenerative disc disease), cervical 02/28/2020   Dysrhythmia    "does not see cardiologist"   Family history of anesthesia complication    "morphine night terrors"   GERD (gastroesophageal reflux disease)    Headache(784.0)    "migraines"   Hypertension    PONV (postoperative nausea and vomiting)    Pre-syncope 07/14/2014   Shortness of breath    "wheezing"   Weakness of left arm 03/03/2020    Allergies  Allergen Reactions   Latex Hives    Rash and hives     Review of Systems:  No headache, visual changes, nausea, vomiting, diarrhea, constipation, dizziness, abdominal pain, skin rash, fevers, chills, night sweats, weight loss, swollen lymph nodes, body  aches, joint swelling, chest pain, shortness of breath, mood changes. POSITIVE muscle aches  Objective  Blood pressure 128/88, pulse 83, height 5\' 2"  (1.575 m), weight 242 lb (109.8 kg), SpO2 97 %.   General: No apparent distress alert and oriented x3 mood and affect normal, dressed appropriately.  HEENT: Pupils equal, extraocular movements intact  Respiratory: Patient's speak in full sentences and does not appear short of breath  Cardiovascular: No lower extremity edema, non tender, no erythema  Neck exam does have loss of lordosis.  Tenderness and tightness noted on the left side of the neck.  Patient does not have any true radicular symptoms with Spurling's but seems to be more uncomfortable.  Patient does have multiple trigger points noted in the left shoulder including the latissimus dorsi, rhomboid and trapezius.  Full strength of the upper extremity  Osteopathic findings  C2 flexed rotated and side bent left C6 flexed rotated and side bent left T3 extended rotated and side bent left inhaled rib T9 extended rotated and side bent left L2 flexed rotated and side bent right Sacrum right on right  After verbal consent patient was prepped with alcohol swab and with a 25-gauge half inch needle injected in 5 distinct trigger points in the latissimus dorsi, rhomboid, trapezius muscles on the left side.  A total of 4 cc of 0.5% Marcaine and 1 cc of Kenalog 40 units.  Minimal blood loss.  Band-Aid placed.  Postinjection instructions given  Assessment and Plan:  Trigger point of left shoulder region i rightnjection given today, responded well, discussed HEP, if worsening consider that patient should do trigger point dry needling again with physical therapy.  Patient will follow-up with me again in 4 to 5 weeks  Low back pain without sciatica Chronic problem with exacerbation as well.  Discussed icing regimen and home exercises.  Patient is continuing to do think is beneficial.  We  discussed which activities to do and which ones to avoid, increase activity slowly otherwise  Cervical radiculopathy at C7 Mild radicular symptoms coming back and we will need to consider the possibility of aggressive therapy such as injections if patient is not responding to the trigger point injections and home exercises.  Patient does have the gabapentin which we discussed   Nonallopathic problems  Decision today to treat with OMT was based on Physical Exam  After verbal consent patient was treated with HVLA, ME, FPR techniques in cervical, rib, thoracic, lumbar, and sacral  areas  Patient tolerated the procedure well with improvement in symptoms  Patient given exercises, stretches and lifestyle modifications  See medications in patient instructions if given  Patient will follow up in 4-8 weeks      The above documentation has been reviewed and is accurate and complete [name], DO       Note: This dictation was prepared with Dragon dictation along with smaller phrase technology. Any transcriptional errors that result from this process are unintentional.

## 2021-10-06 ENCOUNTER — Ambulatory Visit (INDEPENDENT_AMBULATORY_CARE_PROVIDER_SITE_OTHER): Payer: No Typology Code available for payment source | Admitting: Family Medicine

## 2021-10-06 VITALS — BP 128/88 | HR 83 | Ht 62.0 in | Wt 242.0 lb

## 2021-10-06 DIAGNOSIS — M9904 Segmental and somatic dysfunction of sacral region: Secondary | ICD-10-CM

## 2021-10-06 DIAGNOSIS — M25512 Pain in left shoulder: Secondary | ICD-10-CM

## 2021-10-06 DIAGNOSIS — M9903 Segmental and somatic dysfunction of lumbar region: Secondary | ICD-10-CM | POA: Diagnosis not present

## 2021-10-06 DIAGNOSIS — M9901 Segmental and somatic dysfunction of cervical region: Secondary | ICD-10-CM | POA: Diagnosis not present

## 2021-10-06 DIAGNOSIS — M5412 Radiculopathy, cervical region: Secondary | ICD-10-CM

## 2021-10-06 DIAGNOSIS — M9902 Segmental and somatic dysfunction of thoracic region: Secondary | ICD-10-CM

## 2021-10-06 DIAGNOSIS — M545 Low back pain, unspecified: Secondary | ICD-10-CM

## 2021-10-06 DIAGNOSIS — M9908 Segmental and somatic dysfunction of rib cage: Secondary | ICD-10-CM

## 2021-10-06 NOTE — Patient Instructions (Addendum)
Trigger point injections today Good to see you! Do the exercises Write [place] Tuesday to get you in for dry needling See you again in 4-5 weeks

## 2021-10-06 NOTE — Assessment & Plan Note (Signed)
Chronic problem with exacerbation as well.  Discussed icing regimen and home exercises.  Patient is continuing to do think is beneficial.  We discussed which activities to do and which ones to avoid, increase activity slowly otherwise

## 2021-10-06 NOTE — Assessment & Plan Note (Signed)
Mild radicular symptoms coming back and we will need to consider the possibility of aggressive therapy such as injections if patient is not responding to the trigger point injections and home exercises.  Patient does have the gabapentin which we discussed

## 2021-10-06 NOTE — Assessment & Plan Note (Signed)
i rightnjection given today, responded well, discussed HEP, if worsening consider that patient should do trigger point dry needling again with physical therapy.  Patient will follow-up with me again in 4 to 5 weeks

## 2021-10-13 ENCOUNTER — Other Ambulatory Visit: Payer: Self-pay

## 2021-10-13 ENCOUNTER — Other Ambulatory Visit: Payer: Self-pay | Admitting: Internal Medicine

## 2021-10-13 MED ORDER — WEGOVY 2.4 MG/0.75ML ~~LOC~~ SOAJ
2.4000 mg | SUBCUTANEOUS | 0 refills | Status: DC
  Filled 2021-10-13 – 2021-10-26 (×5): qty 3, 28d supply, fill #0

## 2021-10-14 ENCOUNTER — Encounter: Payer: Self-pay | Admitting: Family Medicine

## 2021-10-18 ENCOUNTER — Other Ambulatory Visit: Payer: Self-pay

## 2021-10-18 DIAGNOSIS — M25512 Pain in left shoulder: Secondary | ICD-10-CM

## 2021-10-18 DIAGNOSIS — M5412 Radiculopathy, cervical region: Secondary | ICD-10-CM

## 2021-10-23 ENCOUNTER — Other Ambulatory Visit: Payer: Self-pay | Admitting: Internal Medicine

## 2021-10-23 ENCOUNTER — Other Ambulatory Visit: Payer: Self-pay

## 2021-10-25 ENCOUNTER — Other Ambulatory Visit: Payer: Self-pay

## 2021-10-25 ENCOUNTER — Telehealth: Payer: Self-pay | Admitting: Internal Medicine

## 2021-10-25 MED ORDER — OMEPRAZOLE 40 MG PO CPDR
DELAYED_RELEASE_CAPSULE | Freq: Every day | ORAL | 1 refills | Status: DC
  Filled 2021-10-25: qty 90, 90d supply, fill #0
  Filled 2022-01-31: qty 90, 90d supply, fill #1

## 2021-10-25 NOTE — Telephone Encounter (Signed)
PA has been submitted.

## 2021-10-25 NOTE — Telephone Encounter (Signed)
Pt need PA for wegovy 

## 2021-10-26 ENCOUNTER — Other Ambulatory Visit: Payer: Self-pay

## 2021-10-26 NOTE — Telephone Encounter (Signed)
PA for Lisa [name] has been approved through 10/25/2022. Pt is aware.

## 2021-11-02 ENCOUNTER — Ambulatory Visit
Admission: RE | Admit: 2021-11-02 | Discharge: 2021-11-02 | Disposition: A | Discharge status: Home / Self Care | Payer: No Typology Code available for payment source | Source: Ambulatory Visit | Attending: Family Medicine | Admitting: Family Medicine

## 2021-11-02 ENCOUNTER — Telehealth: Payer: Self-pay

## 2021-11-02 ENCOUNTER — Other Ambulatory Visit: Payer: Self-pay

## 2021-11-02 ENCOUNTER — Ambulatory Visit (INDEPENDENT_AMBULATORY_CARE_PROVIDER_SITE_OTHER): Payer: No Typology Code available for payment source

## 2021-11-02 VITALS — BP 112/72 | HR 84 | Temp 98.3°F | Resp 16

## 2021-11-02 DIAGNOSIS — J209 Acute bronchitis, unspecified: Secondary | ICD-10-CM | POA: Diagnosis not present

## 2021-11-02 DIAGNOSIS — J22 Unspecified acute lower respiratory infection: Secondary | ICD-10-CM

## 2021-11-02 MED ORDER — PREDNISONE 10 MG PO TABS
20.0000 mg | ORAL_TABLET | Freq: Every day | ORAL | 0 refills | Status: DC
  Filled 2021-11-02: qty 10, 5d supply, fill #0

## 2021-11-02 NOTE — Telephone Encounter (Signed)
Patient states she is experiencing sore throat, cough, congestion, shortness of breath, and a low-grade fever.  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  Patient states her covid test results should be back tomorrow.  Patient states her symptoms started around 10pm on 10/31/2021.  **Patient was transferred to Access Nurse

## 2021-11-02 NOTE — Telephone Encounter (Signed)
Pt went to urgent care.

## 2021-11-02 NOTE — ED Triage Notes (Signed)
Pt presents with cough, chest congestion, ST, congestion and nausea x 2 days

## 2021-11-02 NOTE — ED Provider Notes (Incomplete)
Lisa [name]    CSN: [id number] Arrival date & time: 11/02/21  1152      History   Chief Complaint Chief Complaint  Patient presents with   Cough    Triage nurse at my doctors office is concerned about pneumonia - Entered by patient   Sore Throat   Headache   Nasal Congestion   Nausea    HPI Lisa [name] is a 46 y.o. female.   HPI Patient with a history of bronchitis, presents today with cough, congestion, sore throat, and nausea x 2 days. She phone her PCP office and was advised to follow-up here. Patient is concerned for pneumonia. Endorses shortness of breath. She has taken  Past Medical History:  Diagnosis Date   Bronchitis    hx of   Cervical radiculitis [id number]   Complication of anesthesia    COVID-19 10/2020   DDD (degenerative disc disease), cervical 02/28/2020   Dysrhythmia    "does not see cardiologist"   Family history of anesthesia complication    "morphine night terrors"   GERD (gastroesophageal reflux disease)    Headache(784.0)    "migraines"   Hypertension    PONV (postoperative nausea and vomiting)    Pre-syncope 07/14/2014   Shortness of breath    "wheezing"   Weakness of left arm 03/03/2020    Patient Active Problem List   Diagnosis Date Noted   Trigger point of left shoulder region 10/06/2021   Elevated blood pressure reading in office without diagnosis of hypertension 06/21/2020   Cervical radiculitis 03/03/2020   Weakness of left arm 03/03/2020   Cervicalgia 02/28/2020   Spondylosis, cervical, 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   Loud snoring 08/01/2019   B12 deficiency 08/01/2019   Eczema of left external ear 08/01/2019   Hyperlipidemia associated with type 2 diabetes mellitus ([place]) 07/31/2019   Nonallopathic lesion of cervical region 04/29/2019   Nonallopathic lesion of thoracic region 04/29/2019   Nonallopathic lesion of rib cage 04/29/2019   Cervical radiculopathy at C7 03/26/2019   Abnormal breast exam 01/24/2019    Allergic rhinitis due to pollen 08/18/2018   Type 2 diabetes mellitus without complications ([place]) [id number]   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   Encounter for general adult medical examination with abnormal findings 04/29/2016   Low back pain without sciatica 04/28/2015   Morbid obesity ([place]) 04/28/2015    Past Surgical History:  Procedure Laterality Date   CESAREAN SECTION     x 2   MICROLARYNGOSCOPY WITH DILATION N/A 03/13/2020   Procedure: MICROLARYNGOSCOPY WITH DILATION w/JET VENT MITOMYCIN C;  Surgeon: Melida Quitter, MD;  Location: [facility];  Service: ENT;  Laterality: N/A;   MICROLARYNGOSCOPY WITH LASER AND BALLOON DILATION N/A 07/20/2012   Procedure: MICROLARYNGOSCOPY WITH LASER AND BALLOON DILATION;  Surgeon: Melida Quitter, MD;  Location: [facility];  Service: ENT;  Laterality: N/A;  WITH JET VENTURI VENTILATION   TONGUE FLAP RELEASE     widom extractions      OB History   No obstetric history on file.      Home Medications    Prior to Admission medications   Medication Sig Start Date End Date Taking? Authorizing Provider  etonogestrel (NEXPLANON) 68 MG IMPL implant 1 each by Subdermal route once.   Yes [provider]  omeprazole (PRILOSEC) 40 MG capsule TAKE 1 CAPSULE BY MOUTH DAILY. 10/25/21 10/25/22 Yes Crecencio Mc, MD  Semaglutide-Weight Management (WEGOVY) 2.4 MG/0.75ML SOAJ Inject 2.4 mg into the skin once a week for 28 days. 10/19/21  11/26/21 Yes Sherlene Shams, MD  acetaminophen (TYLENOL) 500 MG tablet Take 1,000 mg by mouth every 6 (six) hours as needed.    [provider]  cetirizine (ZYRTEC) 10 MG tablet Take 10 mg by mouth 2 (two) times daily.     [provider]  cholecalciferol (VITAMIN D3) 25 MCG (1000 UNIT) tablet Take 1,000 Units by mouth daily.    [provider]  cyclobenzaprine (FLEXERIL) 10 MG tablet Take 1 tablet (10 mg total) by mouth 3 (three) times daily as needed for muscle spasms. 09/23/21   Sherlene Shams, MD  DENAVIR 1  % cream APPLY TOPICALLY EVERY TWO HOURS FOR 4 DAYS AS NEEDED FOR COLD SORES 03/27/18   Sherlene Shams, MD  fluticasone (FLONASE) 50 MCG/ACT nasal spray PLACE 2 SPRAYS INTO BOTH NOSTRILS DAILY. 03/10/20 09/23/21  Sherlene Shams, MD  folic acid (FOLVITE) 1 MG tablet Take 1 tablet (1 mg total) by mouth daily. 09/23/21   Sherlene Shams, MD  gabapentin (NEURONTIN) 100 MG capsule TAKE 2 CAPSULES (200 MG) BY MOUTH AT BEDTIME. 03/10/20 09/23/21  [name], DO  Insulin Pen Needle (PEN NEEDLES) 32G X 4 MM MISC Use weekly with ozempic pen 01/07/21   Sherlene Shams, MD  Misc Natural Products (TART CHERRY ADVANCED PO) Take 1 capsule by mouth daily.    [provider]  Turmeric 400 MG CAPS Take 400 mg by mouth daily.     [provider]    Family History Family History  Problem Relation Age of Onset   Hypertension Mother    Hyperlipidemia Mother    Arthritis Mother    Rheum arthritis Mother    Hypertension Father    Hyperlipidemia Father    Breast cancer Neg Hx     Social History Social History   Tobacco Use   Smoking status: Never   Smokeless tobacco: Never  Vaping Use   Vaping Use: Never used  Substance Use Topics   Alcohol use: Yes    Comment: "social"   Drug use: No     Allergies   Latex   Review of Systems Review of Systems Pertinent negatives listed in HPI   Physical Exam Triage Vital Signs ED Triage Vitals  Enc Vitals Group     BP 11/02/21 1217 112/72     Pulse Rate 11/02/21 1217 84     Resp 11/02/21 1217 16     Temp 11/02/21 1217 98.3 F (36.8 C)     Temp Source 11/02/21 1217 Oral     SpO2 11/02/21 1217 97 %     Weight --      Height --      Head Circumference --      Peak Flow --      Pain Score 11/02/21 1219 5     Pain Loc --      Pain Edu? --      Excl. in GC? --    No data found.  Updated Vital Signs BP 112/72 (BP Location: Left Arm)   Pulse 84   Temp 98.3 F (36.8 C) (Oral)   Resp 16   SpO2 97%   Visual Acuity Right  Eye Distance:   Left Eye Distance:   Bilateral Distance:    Right Eye Near:   Left Eye Near:    Bilateral Near:     Physical Exam   UC Treatments / Results  Labs (all labs ordered are listed, but only abnormal results are  displayed) Labs Reviewed - No data to display  EKG   Radiology No results found.  Procedures Procedures (including critical care time)  Medications Ordered in UC Medications - No data to display  Initial Impression / Assessment and Plan / UC Course  I have reviewed the triage vital signs and the nursing notes.  Pertinent labs & imaging results that were available during my care of the patient were reviewed by me and considered in my medical decision making (see chart for details).     *** Final Clinical Impressions(s) / UC Diagnoses   Final diagnoses:  Lower respiratory infection   Discharge Instructions   None    ED Prescriptions   None    PDMP not reviewed this encounter.

## 2021-11-02 NOTE — Discharge Instructions (Signed)
Continue Delsym for management of cough.  Resume use of albuterol inhaler 2 puffs every 4-6 hours as needed for any shortness of breath, chest tightness or if you develop any wheezing.  Complete entire 5-day course of prednisone.  Hydrate well with fluids.  If any your symptoms worsen or do not improve within 7 -10 days follow-up with your primary care provider or return for evaluation.

## 2021-11-02 NOTE — Progress Notes (Deleted)
  [name] [address] [place] [address] Phone: [phone] Subjective:    I'm seeing this patient by the request  of:  Sherlene Shams, MD  CC:   ASN:[id number]  Lisa [name] is a 46 y.o. female coming in with complaint of back and neck pain. OMT 10/06/2021. Patient states   Medications patient has been prescribed: None  Taking:         Reviewed prior external information including notes and imaging from previsou exam, outside providers and external EMR if available.   As well as notes that were available from care everywhere and other healthcare systems.  Past medical history, social, surgical and family history all reviewed in electronic medical record.  No pertanent information unless stated regarding to the chief complaint.   Past Medical History:  Diagnosis Date   Bronchitis    hx of   Cervical radiculitis 03/03/2020   Complication of anesthesia    COVID-19 10/2020   DDD (degenerative disc disease), cervical 02/28/2020   Dysrhythmia    "does not see cardiologist"   Family history of anesthesia complication    "morphine night terrors"   GERD (gastroesophageal reflux disease)    Headache(784.0)    "migraines"   Hypertension    PONV (postoperative nausea and vomiting)    Pre-syncope 07/14/2014   Shortness of breath    "wheezing"   Weakness of left arm 03/03/2020    Allergies  Allergen Reactions   Latex Hives    Rash and hives     Review of Systems:  No headache, visual changes, nausea, vomiting, diarrhea, constipation, dizziness, abdominal pain, skin rash, fevers, chills, night sweats, weight loss, swollen lymph nodes, body aches, joint swelling, chest pain, shortness of breath, mood changes. POSITIVE muscle aches  Objective  There were no vitals taken for this visit.   General: No apparent distress alert and oriented x3 mood and affect normal, dressed appropriately.  HEENT: Pupils equal, extraocular  movements intact  Respiratory: Patient's speak in full sentences and does not appear short of breath  Cardiovascular: No lower extremity edema, non tender, no erythema  Gait MSK:  Back   Osteopathic findings  C2 flexed rotated and side bent right C6 flexed rotated and side bent left T3 extended rotated and side bent right inhaled rib T9 extended rotated and side bent left L2 flexed rotated and side bent right Sacrum right on right       Assessment and Plan:  No problem-specific Assessment & Plan notes found for this encounter.    Nonallopathic problems  Decision today to treat with OMT was based on Physical Exam  After verbal consent patient was treated with HVLA, ME, FPR techniques in cervical, rib, thoracic, lumbar, and sacral  areas  Patient tolerated the procedure well with improvement in symptoms  Patient given exercises, stretches and lifestyle modifications  See medications in patient instructions if given  Patient will follow up in 4-8 weeks             Note: This dictation was prepared with Dragon dictation along with smaller phrase technology. Any transcriptional errors that result from this process are unintentional.

## 2021-11-03 ENCOUNTER — Ambulatory Visit: Payer: No Typology Code available for payment source | Admitting: Family Medicine

## 2021-11-05 ENCOUNTER — Telehealth: Payer: No Typology Code available for payment source | Admitting: Physician Assistant

## 2021-11-05 ENCOUNTER — Telehealth: Payer: Self-pay

## 2021-11-05 ENCOUNTER — Other Ambulatory Visit: Payer: Self-pay

## 2021-11-05 DIAGNOSIS — B9689 Other specified bacterial agents as the cause of diseases classified elsewhere: Secondary | ICD-10-CM

## 2021-11-05 DIAGNOSIS — J208 Acute bronchitis due to other specified organisms: Secondary | ICD-10-CM | POA: Diagnosis not present

## 2021-11-05 MED ORDER — AZITHROMYCIN 250 MG PO TABS
ORAL_TABLET | ORAL | 0 refills | Status: DC
  Filled 2021-11-05: qty 6, 5d supply, fill #0

## 2021-11-05 MED ORDER — PREDNISONE 20 MG PO TABS
40.0000 mg | ORAL_TABLET | Freq: Every day | ORAL | 0 refills | Status: DC
  Filled 2021-11-05: qty 10, 5d supply, fill #0

## 2021-11-05 NOTE — Telephone Encounter (Signed)
Pt had an E-visit today.

## 2021-11-08 ENCOUNTER — Emergency Department
Admission: EM | Admit: 2021-11-08 | Discharge: 2021-11-08 | Disposition: A | Discharge status: Home / Self Care | Payer: No Typology Code available for payment source | Attending: Emergency Medicine | Admitting: Emergency Medicine

## 2021-11-08 ENCOUNTER — Ambulatory Visit (INDEPENDENT_AMBULATORY_CARE_PROVIDER_SITE_OTHER): Payer: No Typology Code available for payment source | Admitting: Internal Medicine

## 2021-11-08 ENCOUNTER — Encounter: Payer: Self-pay | Admitting: Emergency Medicine

## 2021-11-08 ENCOUNTER — Encounter: Payer: Self-pay | Admitting: Internal Medicine

## 2021-11-08 ENCOUNTER — Other Ambulatory Visit: Payer: Self-pay

## 2021-11-08 ENCOUNTER — Emergency Department: Payer: No Typology Code available for payment source

## 2021-11-08 VITALS — BP 140/72 | HR 80 | Temp 97.5°F | Resp 24 | Ht 62.0 in | Wt 230.2 lb

## 2021-11-08 DIAGNOSIS — R0602 Shortness of breath: Secondary | ICD-10-CM | POA: Insufficient documentation

## 2021-11-08 DIAGNOSIS — J45909 Unspecified asthma, uncomplicated: Secondary | ICD-10-CM | POA: Insufficient documentation

## 2021-11-08 LAB — CBC WITH DIFFERENTIAL/PLATELET
Abs Immature Granulocytes: 0.09 10*3/uL — ABNORMAL HIGH (ref 0.00–0.07)
Basophils Absolute: 0 10*3/uL (ref 0.0–0.1)
Basophils Relative: 0 %
Eosinophils Absolute: 0 10*3/uL (ref 0.0–0.5)
Eosinophils Relative: 0 %
HCT: 46.1 % — ABNORMAL HIGH (ref 36.0–46.0)
Hemoglobin: 15.1 g/dL — ABNORMAL HIGH (ref 12.0–15.0)
Immature Granulocytes: 1 %
Lymphocytes Relative: 10 %
Lymphs Abs: 1.2 10*3/uL (ref 0.7–4.0)
MCH: 27.4 pg (ref 26.0–34.0)
MCHC: 32.8 g/dL (ref 30.0–36.0)
MCV: 83.5 fL (ref 80.0–100.0)
Monocytes Absolute: 0.1 10*3/uL (ref 0.1–1.0)
Monocytes Relative: 1 %
Neutro Abs: 10.9 10*3/uL — ABNORMAL HIGH (ref 1.7–7.7)
Neutrophils Relative %: 88 %
Platelets: 301 10*3/uL (ref 150–400)
RBC: 5.52 MIL/uL — ABNORMAL HIGH (ref 3.87–5.11)
RDW: 13.5 % (ref 11.5–15.5)
WBC: 12.3 10*3/uL — ABNORMAL HIGH (ref 4.0–10.5)
nRBC: 0 % (ref 0.0–0.2)

## 2021-11-08 LAB — BASIC METABOLIC PANEL
Anion gap: 16 — ABNORMAL HIGH (ref 5–15)
BUN: 20 mg/dL (ref 6–20)
CO2: 17 mmol/L — ABNORMAL LOW (ref 22–32)
Calcium: 9.5 mg/dL (ref 8.9–10.3)
Chloride: 105 mmol/L (ref 98–111)
Creatinine, Ser: 0.98 mg/dL (ref 0.44–1.00)
GFR, Estimated: >60 mL/min (ref >60)
Glucose, Bld: 160 mg/dL — ABNORMAL HIGH (ref 70–99)
Potassium: 3.3 mmol/L — ABNORMAL LOW (ref 3.5–5.1)
Sodium: 138 mmol/L (ref 135–145)

## 2021-11-08 LAB — TROPONIN I (HIGH SENSITIVITY)
Troponin I (High Sensitivity): <2 ng/L (ref <18)
Troponin I (High Sensitivity): <2 ng/L (ref <18)

## 2021-11-08 LAB — POC COVID19 BINAXNOW: SARS Coronavirus 2 Ag: NEGATIVE

## 2021-11-08 LAB — D-DIMER, QUANTITATIVE: D-Dimer, Quant: 0.3 ug/mL-FEU (ref 0.00–0.50)

## 2021-11-08 MED ORDER — IPRATROPIUM-ALBUTEROL 0.5-2.5 (3) MG/3ML IN SOLN
3.0000 mL | Freq: Once | RESPIRATORY_TRACT | Status: AC
  Administered 2021-11-08: 3 mL via RESPIRATORY_TRACT
  Filled 2021-11-08: qty 3

## 2021-11-08 MED ORDER — ACETYLCYSTEINE 20 % IN SOLN
4.0000 mL | Freq: Once | RESPIRATORY_TRACT | Status: AC
  Administered 2021-11-08: 4 mL via RESPIRATORY_TRACT
  Filled 2021-11-08: qty 4

## 2021-11-08 MED ORDER — ALBUTEROL SULFATE (2.5 MG/3ML) 0.083% IN NEBU
2.5000 mg | INHALATION_SOLUTION | Freq: Once | RESPIRATORY_TRACT | Status: AC
  Administered 2021-11-08: 2.5 mg via RESPIRATORY_TRACT

## 2021-11-08 MED ORDER — PREDNISONE 10 MG PO TABS
ORAL_TABLET | ORAL | 0 refills | Status: DC
  Filled 2021-11-08: qty 21, 6d supply, fill #0

## 2021-11-08 NOTE — Progress Notes (Signed)
Patient ID: Lisa [name], female   DOB: 1976-02-24, 46 y.o.   MRN: [id number]   Subjective:    Patient ID: Lisa [name], female    DOB: 01-28-1976, 46 y.o.   MRN: [id number]   Patient here for a work in appt.    Chief Complaint  Patient presents with   Bronchitis    Patient seen at urgent care 6/20 diagnosed with bronchitis given prednisone with no results called access nurse line friday was given an appointment for today   .   HPI Presented initially 11/02/21 with two day history of cough, congestion, sore throat and nausea.  States felt similar to when she had covid.  CXR 6/20 - changes suggestive or possible bronchitis. Was diagnosed with acute bronchitis.  Treated with delsym, prednisone and albuterol inhaler.  Symptoms were not improving, had an Evisit 11/05/21.  Was treated with another round of prednisone and zpak.  She comes in today stating she feels no better.  Still with increased sob.  No known fever.  Some nasal congestion earlier with colored mucus, but no significant sinus pressure.  Previous sore throat.  No chest congestion, but does feel sob.  Has noticed sob with increased talking. If sitting still and not talking, breathing better.  No acid reflux.  Controlled on current medication.  Using albuterol inhaler.  Makes her feel jittery.  No nausea or vomiting.  Decreased appetite.  No leg swelling or redness. Took a home covid test - negative.  PCR through health at work - negative for covid.    Past Medical History:  Diagnosis Date   Bronchitis    hx of   Cervical radiculitis 03/03/2020   Complication of anesthesia    COVID-19 10/2020   DDD (degenerative disc disease), cervical 02/28/2020   Dysrhythmia    "does not see cardiologist"   Family history of anesthesia complication    "morphine night terrors"   GERD (gastroesophageal reflux disease)    Headache(784.0)    "migraines"   Hypertension    PONV (postoperative nausea and vomiting)    Pre-syncope  07/14/2014   Shortness of breath    "wheezing"   Weakness of left arm 03/03/2020   Past Surgical History:  Procedure Laterality Date   CESAREAN SECTION     x 2   MICROLARYNGOSCOPY WITH DILATION N/A 03/13/2020   Procedure: MICROLARYNGOSCOPY WITH DILATION w/JET VENT MITOMYCIN C;  Surgeon: Christia Reading, MD;  Location: [facility] OR;  Service: ENT;  Laterality: N/A;   MICROLARYNGOSCOPY WITH LASER AND BALLOON DILATION N/A 07/20/2012   Procedure: MICROLARYNGOSCOPY WITH LASER AND BALLOON DILATION;  Surgeon: Christia Reading, MD;  Location: [facility] OR;  Service: ENT;  Laterality: N/A;  WITH JET VENTURI VENTILATION   TONGUE FLAP RELEASE     widom extractions     Family History  Problem Relation Age of Onset   Hypertension Mother    Hyperlipidemia Mother    Arthritis Mother    Rheum arthritis Mother    Hypertension Father    Hyperlipidemia Father    Breast cancer Neg Hx    Social History   Socioeconomic History   Marital status: Married    Spouse name: Not on file   Number of children: Not on file   Years of education: Not on file   Highest education level: Not on file  Occupational History   Not on file  Tobacco Use   Smoking status: Never   Smokeless tobacco: Never  Vaping Use   Vaping Use:  Never used  Substance and Sexual Activity   Alcohol use: Yes    Comment: "social"   Drug use: No   Sexual activity: Yes    Birth control/protection: Implant  Other Topics Concern   Not on file  Social History Narrative   Not on file   Social Determinants of Health   Financial Resource Strain: Not on file  Food Insecurity: Not on file  Transportation Needs: Not on file  Physical Activity: Not on file  Stress: Not on file  Social Connections: Not on file     Review of Systems  Constitutional:  Positive for fatigue. Negative for unexpected weight change.       Decreased appetite.    HENT:  Negative for sinus pressure.        Minimal congestion.   Respiratory:  Positive for cough and shortness  of breath. Negative for chest tightness.   Cardiovascular:  Negative for chest pain and leg swelling.  Gastrointestinal:  Negative for abdominal pain, diarrhea, nausea and vomiting.  Genitourinary:  Negative for difficulty urinating and dysuria.  Musculoskeletal:  Negative for joint swelling and myalgias.  Skin:  Negative for color change and rash.  Neurological:  Negative for dizziness, light-headedness and headaches.  Psychiatric/Behavioral:  Negative for agitation and dysphoric mood.        Objective:     BP 140/72 (BP Location: Left Arm, Patient Position: Sitting, Cuff Size: Normal)   Pulse 80   Temp (!) 97.5 F (36.4 C) (Oral)   Resp (!) 24   Ht 5\' 2"  (1.575 m)   Wt 230 lb 3.2 oz (104.4 kg)   SpO2 99%   BMI 42.10 kg/m  Wt Readings from Last 3 Encounters:  11/08/21 230 lb (104.3 kg)  11/08/21 230 lb 3.2 oz (104.4 kg)  10/06/21 242 lb (109.8 kg)    Physical Exam Vitals reviewed.  Constitutional:      Appearance: Normal appearance.     Comments: Appears uncomfortable.   HENT:     Head: Normocephalic and atraumatic.     Right Ear: External ear normal.     Left Ear: External ear normal.  Eyes:     General: No scleral icterus.       Right eye: No discharge.        Left eye: No discharge.     Conjunctiva/sclera: Conjunctivae normal.  Neck:     Thyroid: No thyromegaly.  Cardiovascular:     Rate and Rhythm: Normal rate and regular rhythm.  Pulmonary:     Comments: Audibly sob with talking.  No wheezing on exam.  Increased cough.   Abdominal:     General: Bowel sounds are normal.     Palpations: Abdomen is soft.     Tenderness: There is no abdominal tenderness.  Musculoskeletal:        General: No swelling or tenderness.     Cervical back: Neck supple. No tenderness.  Lymphadenopathy:     Cervical: No cervical adenopathy.  Skin:    Findings: No erythema or rash.  Neurological:     Mental Status: She is alert.  Psychiatric:        Mood and Affect: Mood  normal.        Behavior: Behavior normal.      Outpatient Encounter Medications as of 11/08/2021  Medication Sig   acetaminophen (TYLENOL) 500 MG tablet Take 1,000 mg by mouth every 6 (six) hours as needed.   azithromycin (ZITHROMAX) 250 MG tablet Take 2 tablets  on day 1, then 1 tablet daily on days 2 through 5   cetirizine (ZYRTEC) 10 MG tablet Take 10 mg by mouth 2 (two) times daily.    cholecalciferol (VITAMIN D3) 25 MCG (1000 UNIT) tablet Take 1,000 Units by mouth daily.   cyclobenzaprine (FLEXERIL) 10 MG tablet Take 1 tablet (10 mg total) by mouth 3 (three) times daily as needed for muscle spasms.   DENAVIR 1 % cream APPLY TOPICALLY EVERY TWO HOURS FOR 4 DAYS AS NEEDED FOR COLD SORES   etonogestrel (NEXPLANON) 68 MG IMPL implant 1 each by Subdermal route once.   folic acid (FOLVITE) 1 MG tablet Take 1 tablet (1 mg total) by mouth daily.   Insulin Pen Needle (PEN NEEDLES) 32G X 4 MM MISC Use weekly with ozempic pen   Misc Natural Products (TART CHERRY ADVANCED PO) Take 1 capsule by mouth daily.   omeprazole (PRILOSEC) 40 MG capsule TAKE 1 CAPSULE BY MOUTH DAILY.   predniSONE (DELTASONE) 20 MG tablet Take 2 tablets (40 mg total) by mouth daily with breakfast.   Semaglutide-Weight Management (WEGOVY) 2.4 MG/0.75ML SOAJ Inject 2.4 mg into the skin once a week for 28 days.   Turmeric 400 MG CAPS Take 400 mg by mouth daily.    fluticasone (FLONASE) 50 MCG/ACT nasal spray PLACE 2 SPRAYS INTO BOTH NOSTRILS DAILY.   gabapentin (NEURONTIN) 100 MG capsule TAKE 2 CAPSULES (200 MG) BY MOUTH AT BEDTIME.   [EXPIRED] albuterol (PROVENTIL) (2.5 MG/3ML) 0.083% nebulizer solution 2.5 mg    No facility-administered encounter medications on file as of 11/08/2021.     Lab Results  Component Value Date   WBC 12.3 (H) 11/08/2021   HGB 15.1 (H) 11/08/2021   HCT 46.1 (H) 11/08/2021   PLT 301 11/08/2021   GLUCOSE 160 (H) 11/08/2021   CHOL 171 09/23/2021   TRIG 204.0 (H) 09/23/2021   HDL 38.80 (L)  09/23/2021   LDLDIRECT 104.0 09/23/2021   LDLCALC 107 (H) 07/08/2020   ALT 21 09/23/2021   AST 15 09/23/2021   NA 138 11/08/2021   K 3.3 (L) 11/08/2021   CL 105 11/08/2021   CREATININE 0.98 11/08/2021   BUN 20 11/08/2021   CO2 17 (L) 11/08/2021   TSH 2.55 09/23/2021   HGBA1C 5.7 09/23/2021   MICROALBUR <0.7 06/17/2021    DG Chest 2 View  Result Date: 11/02/2021 CLINICAL DATA:  Cough, shortness of breath x3 days EXAM: CHEST - 2 VIEW COMPARISON:  11/12/2020 FINDINGS: Cardiac size is within normal limits. There are no signs of alveolar pulmonary edema or focal pulmonary consolidation. There is mild peribronchial thickening. There is no pleural effusion or pneumothorax. IMPRESSION: There is mild peribronchial thickening suggesting possible bronchitis. There is no focal pulmonary consolidation. There is no pleural effusion. Electronically Signed   By: Ernie Avena M.D.   On: 11/02/2021 12:35       Assessment & Plan:   Problem List Items Addressed This Visit     SOB (shortness of breath)    Unclear etiology.  Has been treated for prednisone x 2 rounds and oral abx.  No improvement in symptoms.  Persistent significant increased sob.  Audibly sob with talking.  EKG - SR with no acute ischemic changes.  Reviewed cxr results.  Changes c/w bronchitis.  No pneumonia.  Given albuterol neb in office.  No improvement in her symptoms. Pulse ox 97-100% on room air, but still visibly sob.  Given persistent sob despite two rounds of prednisone and oral abx and  no improvement with neb treatment, discussed the need for ER evaluation.  Discussed possible need for nebs, IV solumedrol, further xray/scanning - to help determine etiology.  Pt agreeable.  ER notified.        Other Visit Diagnoses     Shortness of breath    -  Primary   Relevant Medications   albuterol (PROVENTIL) (2.5 MG/3ML) 0.083% nebulizer solution 2.5 mg (Completed)   Other Relevant Orders   EKG 12-Lead (Completed)   POC  COVID-19 (Completed)        Dale Victoria Vera, MD

## 2021-11-08 NOTE — ED Provider Notes (Signed)
[facility] Provider Note    Event Date/Time   First MD Initiated Contact with Patient 11/08/21 1737     (approximate)   History   Shortness of Breath   HPI  Lisa [name] is a 46 y.o. female  who, per primary care note dated today has recently been treated for bronchitis.  Patient states she has finished 2 courses of steroids as well as antibiotics.  The patient has continued to have shortness of breath.  The patient states that she has issues with her upper airway but denies any history of asthma or COPD.  States she does not smoke.  Did recently come back from a trip to [place] in [place] so has been on shorter length plane flights.  Patient denies any significant chest pain.  Physical Exam   Triage Vital Signs: ED Triage Vitals  Enc Vitals Group     BP 11/08/21 1703 125/82     Pulse Rate 11/08/21 1703 100     Resp 11/08/21 1703 20     Temp 11/08/21 1703 98.3 F (36.8 C)     Temp Source 11/08/21 1703 Oral     SpO2 11/08/21 1703 98 %     Weight 11/08/21 1705 230 lb (104.3 kg)     Height 11/08/21 1705 5\' 2"  (1.575 m)     Head Circumference --      Peak Flow --      Pain Score 11/08/21 1704 0     Pain Loc --      Pain Edu? --      Excl. in GC? --     Most recent vital signs: Vitals:   11/08/21 1703 11/08/21 1730  BP: 125/82 128/76  Pulse: 100 91  Resp: 20 20  Temp: 98.3 F (36.8 C) 98 F (36.7 C)  SpO2: 98% 100%    General: Awake, alert, oriented. CV:  Good peripheral perfusion. Regular rate and rhythm. Resp:  Slightly increased respiratory effort. Lungs clear to auscultation. Abd:  No distention.   ED Results / Procedures / Treatments   Labs (all labs ordered are listed, but only abnormal results are displayed) Labs Reviewed  CBC WITH DIFFERENTIAL/PLATELET - Abnormal; Notable for the following components:      Result Value   WBC 12.3 (*)    RBC 5.52 (*)    Hemoglobin 15.1 (*)    HCT 46.1 (*)    Neutro Abs 10.9 (*)     Abs Immature Granulocytes 0.09 (*)    All other components within normal limits  BASIC METABOLIC PANEL - Abnormal; Notable for the following components:   Potassium 3.3 (*)    CO2 17 (*)    Glucose, Bld 160 (*)    Anion gap 16 (*)    All other components within normal limits  D-DIMER, QUANTITATIVE  TROPONIN I (HIGH SENSITIVITY)  TROPONIN I (HIGH SENSITIVITY)     EKG  I, Phineas Semen, attending physician, personally viewed and interpreted this EKG  EKG Time: 1709 Rate: 101 Rhythm: sinus tachycardia Axis: normal Intervals: qtc 453 QRS: narrow ST changes: no st elevation Impression: abnormal ekg   RADIOLOGY I independently interpreted and visualized the CXR. My interpretation: No pneumonia. No pneumothorax.  Radiology interpretation:  IMPRESSION:  No active cardiopulmonary disease.    PROCEDURES:  Critical Care performed: No  Procedures   MEDICATIONS ORDERED IN ED: Medications - No data to display   IMPRESSION / MDM / ASSESSMENT AND PLAN / ED COURSE  I reviewed the triage vital signs and the nursing notes.                              Differential diagnosis includes, but is not limited to, pneumonia, bronchitis, PE, acs.  Patient's presentation is most consistent with acute presentation with potential threat to life or bodily function.  Patient presented to the emergency department today because of concerns for continued shortness of breath.  Patient has been treated recently for bronchitis with steroids as well as antibiotics.  Patient does have slight increase in respiratory effort on initial exam however lungs were clear.  Patient had negative D-dimer as well as negative troponin.  X-ray today without concerning abnormality.  Patient did seem to get most improvement after Mucomyst administration.  I did have a discussion with the patient.  She did still have some increased work of breathing however was not hypoxic and did appear improved.  This time  patient felt comfortable being discharged home and I think this is reasonable.  Will give patient pulmonary follow-up information.  FINAL CLINICAL IMPRESSION(S) / ED DIAGNOSES   Final diagnoses:  Shortness of breath      Note:  This document was prepared using Dragon voice recognition software and may include unintentional dictation errors.    Phineas Semen, MD 11/08/21 [phone]

## 2021-11-09 ENCOUNTER — Telehealth: Payer: Self-pay

## 2021-11-09 ENCOUNTER — Encounter: Payer: Self-pay | Admitting: Internal Medicine

## 2021-11-09 ENCOUNTER — Other Ambulatory Visit: Payer: Self-pay

## 2021-11-09 ENCOUNTER — Ambulatory Visit: Payer: No Typology Code available for payment source | Admitting: Family Medicine

## 2021-11-09 DIAGNOSIS — R0602 Shortness of breath: Secondary | ICD-10-CM

## 2021-11-09 MED ORDER — LEVALBUTEROL TARTRATE 45 MCG/ACT IN AERO
2.0000 | INHALATION_SPRAY | Freq: Four times a day (QID) | RESPIRATORY_TRACT | 1 refills | Status: DC | PRN
  Filled 2021-11-09: qty 15, 25d supply, fill #0

## 2021-11-09 NOTE — Telephone Encounter (Signed)
Patient called back transferred call to [facility].

## 2021-11-10 ENCOUNTER — Other Ambulatory Visit: Payer: Self-pay

## 2021-11-10 ENCOUNTER — Encounter: Payer: Self-pay | Admitting: Pulmonary Disease

## 2021-11-10 ENCOUNTER — Ambulatory Visit (INDEPENDENT_AMBULATORY_CARE_PROVIDER_SITE_OTHER): Payer: No Typology Code available for payment source | Admitting: Pulmonary Disease

## 2021-11-10 ENCOUNTER — Telehealth: Payer: Self-pay

## 2021-11-10 VITALS — BP 136/78 | HR 83 | Temp 97.8°F | Ht 61.0 in | Wt 229.2 lb

## 2021-11-10 DIAGNOSIS — J209 Acute bronchitis, unspecified: Secondary | ICD-10-CM

## 2021-11-10 DIAGNOSIS — J386 Stenosis of larynx: Secondary | ICD-10-CM

## 2021-11-10 DIAGNOSIS — R0602 Shortness of breath: Secondary | ICD-10-CM

## 2021-11-10 MED ORDER — METHYLPREDNISOLONE 4 MG PO TBPK
ORAL_TABLET | ORAL | 0 refills | Status: DC
  Filled 2021-11-10: qty 21, 6d supply, fill #0

## 2021-11-10 MED ORDER — ARNUITY ELLIPTA 100 MCG/ACT IN AEPB
1.0000 | INHALATION_SPRAY | Freq: Every day | RESPIRATORY_TRACT | 3 refills | Status: DC
  Filled 2021-11-10: qty 30, 30d supply, fill #0
  Filled 2021-12-05: qty 30, 30d supply, fill #1

## 2021-11-10 NOTE — Patient Instructions (Signed)
We sent in a Medrol Dosepak.  Sent in a prescription for Arnuity Ellipta.  Trying to procure an ointment with Dr. Jenne Pane as soon as possible.  If unable to get Dr. Jenne Pane as soon as possible we may need to go with Dr. Vergie Living at [facility].  We will see you in follow-up in 4 to 6 weeks call sooner if your symptoms worsen in the interim.

## 2021-11-10 NOTE — Progress Notes (Signed)
Subjective:    Patient ID: Lisa [name], female    DOB: 1975-12-06, 46 y.o.   MRN: [id number] Patient Care Team: Sherlene Shams, MD as PCP - General (Internal Medicine)  Chief Complaint  Patient presents with   pulmonary consult    [facility] visit 11/01/2021 and ED 11/08/2021-dx with bronchitis. Tx with zpak and prednisone 40mg  without relief. C/o sob with exertion and non prod cough x10d. Hasn't started prednisone taper prescribed at ED.    HPI Patient is a 46 year old lifelong never smoker who presents for evaluation of persistent issues with bronchitis and shortness of breath.  She is kindly referred by Dr. Duncan Dull.  Patient was seen at urgent care on 01 November 2021 and subsequently had a emergency room on 08 November 2021 with diagnosis of bronchitis.  She had noted cough productive of yellowish to greenish sputum at the beginning of her symptoms.  She had some generalized malaise but no fevers, chills or sweats.  After the first course of antibiotics and prednisone her cough became nonproductive but is still persists.  For the last 10 days has been mostly nonproductive.  She was prescribed another prednisone taper by the emergency room on her visit of the 26 however she has not started that.  Her main issue has been feeling her chest despite and shortness of breath.  She has tested negative for COVID-19.  She works in the [employer] and has had difficulty wearing a mask while assisting procedures.  Does not endorse wheezing per se.  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  She has not had any weight loss or anorexia.  No other symptomatology.  She has not noted any significant improvement from the interventions noted above.   Review of Systems A 10 point review of systems was performed and it is as noted above otherwise negative.  Past Medical History:  Diagnosis Date   Bronchitis    hx of   Cervical  radiculitis 03/03/2020   Complication of anesthesia    COVID-19 10/2020   DDD (degenerative disc disease), cervical 02/28/2020   Dysrhythmia    "does not see cardiologist"   Family history of anesthesia complication    "morphine night terrors"   GERD (gastroesophageal reflux disease)    Headache(784.0)    "migraines"   Hypertension    PONV (postoperative nausea and vomiting)    Pre-syncope 07/14/2014   Shortness of breath    "wheezing"   Weakness of left arm 03/03/2020   Past Surgical History:  Procedure Laterality Date   CESAREAN SECTION     x 2   MICROLARYNGOSCOPY WITH DILATION N/A 03/13/2020   Procedure: MICROLARYNGOSCOPY WITH DILATION w/JET VENT MITOMYCIN C;  Surgeon: Christia Reading, MD;  Location: [facility] OR;  Service: ENT;  Laterality: N/A;   MICROLARYNGOSCOPY WITH LASER AND BALLOON DILATION N/A 07/20/2012   Procedure: MICROLARYNGOSCOPY WITH LASER AND BALLOON DILATION;  Surgeon: Christia Reading, MD;  Location: [facility] OR;  Service: ENT;  Laterality: N/A;  WITH JET VENTURI VENTILATION   TONGUE FLAP RELEASE     widom extractions     Patient Active Problem List   Diagnosis Date Noted   SOB (shortness of breath) 11/08/2021   Trigger point of left shoulder region 10/06/2021   Elevated blood pressure reading in office without diagnosis of hypertension 06/21/2020   Cervical radiculitis 03/03/2020   Weakness of left arm 03/03/2020   Cervicalgia 02/28/2020  Spondylosis, cervical, 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   Loud snoring 08/01/2019   B12 deficiency 08/01/2019   Eczema of left external ear 08/01/2019   Hyperlipidemia associated with type 2 diabetes mellitus (HCC) 07/31/2019   Nonallopathic lesion of cervical region 04/29/2019   Nonallopathic lesion of thoracic region 04/29/2019   Nonallopathic lesion of rib cage 04/29/2019   Cervical radiculopathy at C7 03/26/2019   Abnormal breast exam 01/24/2019   Allergic rhinitis due to pollen 08/18/2018   Type 2 diabetes mellitus without  complications (HCC) 05/02/2017   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   Encounter for general adult medical examination with abnormal findings 04/29/2016   Low back pain without sciatica 04/28/2015   Morbid obesity (HCC) 04/28/2015   Family History  Problem Relation Age of Onset   Hypertension Mother    Hyperlipidemia Mother    Arthritis Mother    Rheum arthritis Mother    Hypertension Father    Hyperlipidemia Father    Breast cancer Neg Hx    Social History   Tobacco Use   Smoking status: Never   Smokeless tobacco: Never  Substance Use Topics   Alcohol use: Yes    Comment: "social"   Allergies  Allergen Reactions   Latex Hives    Rash and hives   Current Meds  Medication Sig   acetaminophen (TYLENOL) 500 MG tablet Take 1,000 mg by mouth every 6 (six) hours as needed.   cetirizine (ZYRTEC) 10 MG tablet Take 10 mg by mouth 2 (two) times daily.    cholecalciferol (VITAMIN D3) 25 MCG (1000 UNIT) tablet Take 1,000 Units by mouth daily.   cyclobenzaprine (FLEXERIL) 10 MG tablet Take 1 tablet (10 mg total) by mouth 3 (three) times daily as needed for muscle spasms.   DENAVIR 1 % cream APPLY TOPICALLY EVERY TWO HOURS FOR 4 DAYS AS NEEDED FOR COLD SORES   etonogestrel (NEXPLANON) 68 MG IMPL implant 1 each by Subdermal route once.   folic acid (FOLVITE) 1 MG tablet Take 1 tablet (1 mg total) by mouth daily.   Insulin Pen Needle (PEN NEEDLES) 32G X 4 MM MISC Use weekly with ozempic pen   levalbuterol (XOPENEX HFA) 45 MCG/ACT inhaler Inhale 2 puffs into the lungs every 6 (six) hours as needed for wheezing.   Misc Natural Products (TART CHERRY ADVANCED PO) Take 1 capsule by mouth daily.   predniSONE (DELTASONE) 10 MG tablet Take 6 tabs on day 1, 5 tabs on day 2, 4 tabs on day 3, 3 tabs on day 4, 2 tabs on day 5, 1 tab on day 6. Then stop.   predniSONE (DELTASONE) 20 MG tablet Take 2 tablets (40 mg total) by mouth daily with breakfast.   Semaglutide-Weight Management (WEGOVY) 2.4 MG/0.75ML  SOAJ Inject 2.4 mg into the skin once a week for 28 days.   Turmeric 400 MG CAPS Take 400 mg by mouth daily.    [DISCONTINUED] azithromycin (ZITHROMAX) 250 MG tablet Take 2 tablets on day 1, then 1 tablet daily on days 2 through 5   [DISCONTINUED] omeprazole (PRILOSEC) 40 MG capsule TAKE 1 CAPSULE BY MOUTH DAILY.   Immunization History  Administered Date(s) Administered   Influenza Split 02/25/2015, 01/30/2017   Influenza-Unspecified 02/09/2016, 01/21/2018, 02/12/2019, 03/13/2020, 02/10/2021   Moderna Sars-Covid-2 Vaccination 05/06/2019, 05/27/2019, 05/12/2020   PNEUMOCOCCAL CONJUGATE-20 01/07/2021   Tdap 04/27/2016     Objective:   Physical Exam BP 136/78 (BP Location: Left Arm, Cuff Size: Large)   Pulse 83   Temp 97.8 F (36.6 C) (  Temporal)   Ht 5\' 1"  (1.549 m)   Wt 229 lb 3.2 oz (104 kg)   SpO2 99%   BMI 43.31 kg/m   SpO2: 99 % O2 Device: None (Room air)   GENERAL: Obese woman, no acute distress, fully ambulatory.  No conversational dyspnea. HEAD: Normocephalic, atraumatic.  EYES: Pupils equal, round, reactive to light. No scleral icterus.  MOUTH: Oral mucosa moist.  No thrush.  Dentition intact. NECK: Supple. No thyromegaly. Trachea midline. No JVD. No adenopathy.  No stridor. PULMONARY: Good air entry bilaterally. No adventitious sounds. CARDIOVASCULAR: S1 and S2. Regular rate and rhythm.  ABDOMEN: Obese, otherwise benign. MUSCULOSKELETAL: No joint deformity, no clubbing, no edema.  NEUROLOGIC: No overt focal deficit, no gait disturbance, speech is fluent. SKIN: Intact,warm,dry. PSYCH: Mood and behavior normal.   Chest x-ray performed 08 November 2021 shows no acute disease:   Spirometry was performed today: Spirometry was normal showed no evidence of overt obstruction only partial flow-volume loop available    Assessment & Plan:     ICD-10-CM   1. Acute tracheobronchitis  J20.9    May have element of airways reactivity Medrol Dosepak Arnuity Ellipta    2. SOB  (shortness of breath)  R06.02 Spirometry with Graph   Spirometry shows no obstruction Cannot exclude recurrent subglottic stenosis    3. Subglottic stenosis  J38.6    Recommend a visit with ENT (Dr. Jenne Pane) May need reassessment     Orders Placed This Encounter  Procedures   Spirometry with Graph    Order Specific Question:   Where should this test be performed?    Answer:   Other   Meds ordered this encounter  Medications   methylPREDNISolone (MEDROL DOSEPAK) 4 MG TBPK tablet    Sig: Take as directed in the package    Dispense:  21 tablet    Refill:  0   Fluticasone Furoate (ARNUITY ELLIPTA) 100 MCG/ACT AEPB    Sig: Inhale 1 puff into the lungs daily.    Dispense:  30 each    Refill:  3   We will see the patient in follow-up in 4 to 6 weeks time call sooner should any new problems arise.  Gailen Shelter, MD Advanced Bronchoscopy PCCM [facility]    *This note was dictated using voice recognition software/Dragon.  Despite best efforts to proofread, errors can occur which can change the meaning. Any transcriptional errors that result from this process are unintentional and may not be fully corrected at the time of dictation.

## 2021-11-10 NOTE — Telephone Encounter (Signed)
Per Dr. Tyrell Antonio needs to see Dr. Jenne Pane for subglottic stenosis prior to August.  Called Dr. Jenne Pane office and requested sooner appt. RN will call back to obtain additional information.

## 2021-11-11 NOTE — Telephone Encounter (Signed)
Patient is aware that we are still awaiting call back from Dr. Jenne Pane office. She voiced her understanding.

## 2021-11-12 NOTE — Telephone Encounter (Signed)
Spoke to Dr. Jenne Pane office and scheduled appt for 11/17/2021 at 2:30.  Lisa [name] is aware of date/time and voiced her understanding.  Nothing further needed.

## 2021-11-15 ENCOUNTER — Encounter: Payer: Self-pay | Admitting: Internal Medicine

## 2021-11-15 ENCOUNTER — Ambulatory Visit (INDEPENDENT_AMBULATORY_CARE_PROVIDER_SITE_OTHER): Payer: No Typology Code available for payment source | Admitting: Internal Medicine

## 2021-11-15 VITALS — BP 110/82 | HR 78 | Temp 97.6°F | Ht 61.0 in | Wt 227.2 lb

## 2021-11-15 DIAGNOSIS — J386 Stenosis of larynx: Secondary | ICD-10-CM

## 2021-11-15 DIAGNOSIS — E872 Acidosis, unspecified: Secondary | ICD-10-CM | POA: Insufficient documentation

## 2021-11-15 DIAGNOSIS — Z0279 Encounter for issue of other medical certificate: Secondary | ICD-10-CM

## 2021-11-15 DIAGNOSIS — E876 Hypokalemia: Secondary | ICD-10-CM

## 2021-11-15 DIAGNOSIS — E1169 Type 2 diabetes mellitus with other specified complication: Secondary | ICD-10-CM | POA: Diagnosis not present

## 2021-11-15 DIAGNOSIS — E785 Hyperlipidemia, unspecified: Secondary | ICD-10-CM

## 2021-11-15 LAB — BASIC METABOLIC PANEL
BUN: 23 mg/dL (ref 6–23)
CO2: 21 mEq/L (ref 19–32)
Calcium: 9.7 mg/dL (ref 8.4–10.5)
Chloride: 104 mEq/L (ref 96–112)
Creatinine, Ser: 0.74 mg/dL (ref 0.40–1.20)
GFR: 98.09 mL/min (ref >60.00)
Glucose, Bld: 153 mg/dL — ABNORMAL HIGH (ref 70–99)
Potassium: 3.6 mEq/L (ref 3.5–5.1)
Sodium: 136 mEq/L (ref 135–145)

## 2021-11-15 LAB — MAGNESIUM: Magnesium: 2 mg/dL (ref 1.5–2.5)

## 2021-11-15 NOTE — Assessment & Plan Note (Signed)
Will discuss adding statin therapy at next diabetes follow up   Lab Results  Component Value Date   CHOL 171 09/23/2021   HDL 38.80 (L) 09/23/2021   LDLCALC 107 (H) 07/08/2020   LDLDIRECT 104.0 09/23/2021   TRIG 204.0 (H) 09/23/2021   CHOLHDL 4 09/23/2021

## 2021-11-15 NOTE — Assessment & Plan Note (Addendum)
With recent episode of tracheitis resulting in ER visit . Not admitted.  She is S/p microlaryngoscopy with balloon dilation Oct 2021 (2nd time).  Has follow up with Jenne Pane on July 5.  Currently ventilating  much better since taking steroids

## 2021-11-15 NOTE — Assessment & Plan Note (Signed)
With type 2 DM,  Has lost 6 0 lbs  Using GLP 1 agonist 2. Mg semaglutide.  Next goal is > 200 lbs.

## 2021-11-15 NOTE — Progress Notes (Signed)
Subjective:  Patient ID: Lisa [name], female    DOB: 02-07-1976  Age: 46 y.o. MRN: [id number]  CC: The primary encounter diagnosis was Hypokalemia. Diagnoses of Hyperlipidemia associated with type 2 diabetes mellitus (HCC), Acidosis, Subglottic stenosis, and Morbid obesity (HCC) were also pertinent to this visit.   HPI DEZHANE STATEN presents for  Chief Complaint  Patient presents with   Follow-up    Follow up on bronchitis    Treated in ER for respiratory distress secondary to tracheitis/bronchitis with multiple nebs  on June 26 after she did not respond to office neb.  Symptoms  started  suddenly after returning home 48 hours from a whirlwind vacation including 3 destinations [place], [place] and [place],  (3 airports  ) .  Covid negative.  ruled out for AMI and PE by labs (negative D Dimer)  Sent home after several nebs with minimal improvement.  Bicarb on BMET was 17.  No ABG done    Alba Destine on 6/28.  Given  steroid inhaler and prednisone dose pack . Referred to [facility]   has appt on July 5 in [place] .  Surgery was Oct 2021  for the last 3 days feeling much less dyspneic with ADL's.   02  sats never dropped below 97%   using annuity once daily , and finishes  her prednisone  tomorrow   Obesity:  she has achieved a 60 lb weight loss thus far ing glp 1 agonist and is currently on the maximum  2.4 mg weekly dose of Wegovy, still losing weightat a  rate of loss 1-2 lbs per week    Outpatient Medications Prior to Visit  Medication Sig Dispense Refill   acetaminophen (TYLENOL) 500 MG tablet Take 1,000 mg by mouth every 6 (six) hours as needed.     cetirizine (ZYRTEC) 10 MG tablet Take 10 mg by mouth 2 (two) times daily.      cholecalciferol (VITAMIN D3) 25 MCG (1000 UNIT) tablet Take 1,000 Units by mouth daily.     cyclobenzaprine (FLEXERIL) 10 MG tablet Take 1 tablet (10 mg total) by mouth 3 (three) times daily as needed for muscle spasms. 90 tablet 1   DENAVIR 1  % cream APPLY TOPICALLY EVERY TWO HOURS FOR 4 DAYS AS NEEDED FOR COLD SORES 5 g 0   etonogestrel (NEXPLANON) 68 MG IMPL implant 1 each by Subdermal route once.     fluticasone (FLONASE) 50 MCG/ACT nasal spray PLACE 2 SPRAYS INTO BOTH NOSTRILS DAILY. 48 g 1   Fluticasone Furoate (ARNUITY ELLIPTA) 100 MCG/ACT AEPB Inhale 1 puff into the lungs daily. 30 each 3   folic acid (FOLVITE) 1 MG tablet Take 1 tablet (1 mg total) by mouth daily. 90 tablet 1   Insulin Pen Needle (PEN NEEDLES) 32G X 4 MM MISC Use weekly with ozempic pen 100 each 0   methylPREDNISolone (MEDROL DOSEPAK) 4 MG TBPK tablet Take as directed in the package 21 tablet 0   Misc Natural Products (TART CHERRY ADVANCED PO) Take 1 capsule by mouth daily.     Semaglutide-Weight Management (WEGOVY) 2.4 MG/0.75ML SOAJ Inject 2.4 mg into the skin once a week for 28 days. 3 mL 0   Turmeric 400 MG CAPS Take 400 mg by mouth daily.      gabapentin (NEURONTIN) 100 MG capsule TAKE 2 CAPSULES (200 MG) BY MOUTH AT BEDTIME. (Patient not taking: Reported on 11/15/2021) 180 capsule 3   No facility-administered medications prior to visit.  Review of Systems;  Patient denies headache, fevers, malaise, unintentional weight loss, skin rash, eye pain, sinus congestion and sinus pain, sore throat, dysphagia,  hemoptysis , cough, dyspnea, wheezing, chest pain, palpitations, orthopnea, edema, abdominal pain, nausea, melena, diarrhea, constipation, flank pain, dysuria, hematuria, urinary  Frequency, nocturia, numbness, tingling, seizures,  Focal weakness, Loss of consciousness,  Tremor, insomnia, depression, anxiety, and suicidal ideation.      Objective:  BP 110/82 (BP Location: Left Arm, Patient Position: Sitting, Cuff Size: Large)   Pulse 78   Temp 97.6 F (36.4 C) (Oral)   Ht 5\' 1"  (1.549 m)   Wt 227 lb 3.2 oz (103.1 kg)   SpO2 99%   BMI 42.93 kg/m   BP Readings from Last 3 Encounters:  11/15/21 110/82  11/10/21 136/78  11/08/21 113/82    Wt  Readings from Last 3 Encounters:  11/15/21 227 lb 3.2 oz (103.1 kg)  11/10/21 229 lb 3.2 oz (104 kg)  11/08/21 230 lb (104.3 kg)    General appearance: alert, cooperative and appears stated age Ears: normal TM's and external ear canals both ears Throat: lips, mucosa, and tongue normal; teeth and gums normal Neck: no adenopathy, no carotid bruit, supple, symmetrical, trachea midline and thyroid not enlarged, symmetric, no tenderness/mass/nodules Back: symmetric, no curvature. ROM normal. No CVA tenderness. Lungs: clear to auscultation bilaterally Heart: regular rate and rhythm, S1, S2 normal, no murmur, click, rub or gallop Abdomen: soft, non-tender; bowel sounds normal; no masses,  no organomegaly Pulses: 2+ and symmetric Skin: Skin color, texture, turgor normal. No rashes or lesions Lymph nodes: Cervical, supraclavicular, and axillary nodes normal.  Lab Results  Component Value Date   HGBA1C 5.7 09/23/2021   HGBA1C 6.2 04/13/2021   HGBA1C 6.9 (H) 01/07/2021    Lab Results  Component Value Date   CREATININE 0.98 11/08/2021   CREATININE 0.73 09/23/2021   CREATININE 0.81 06/17/2021    Lab Results  Component Value Date   WBC 12.3 (H) 11/08/2021   HGB 15.1 (H) 11/08/2021   HCT 46.1 (H) 11/08/2021   PLT 301 11/08/2021   GLUCOSE 160 (H) 11/08/2021   CHOL 171 09/23/2021   TRIG 204.0 (H) 09/23/2021   HDL 38.80 (L) 09/23/2021   LDLDIRECT 104.0 09/23/2021   LDLCALC 107 (H) 07/08/2020   ALT 21 09/23/2021   AST 15 09/23/2021   NA 138 11/08/2021   K 3.3 (L) 11/08/2021   CL 105 11/08/2021   CREATININE 0.98 11/08/2021   BUN 20 11/08/2021   CO2 17 (L) 11/08/2021   TSH 2.55 09/23/2021   HGBA1C 5.7 09/23/2021   MICROALBUR <0.7 06/17/2021    DG Chest 2 View  Result Date: 11/08/2021 CLINICAL DATA:  Shortness of breath EXAM: CHEST - 2 VIEW COMPARISON:  Chest radiograph 11/02/21 FINDINGS: The cardiomediastinal contours are within normal limits. The lungs are clear. No pneumothorax  or pleural effusion. No acute finding in the visualized skeleton. IMPRESSION: No active cardiopulmonary disease. Electronically Signed   By: 11/04/21 M.D.   On: 11/08/2021 17:36    Assessment & Plan:   Problem List Items Addressed This Visit     Hyperlipidemia associated with type 2 diabetes mellitus (HCC)    Will discuss adding statin therapy at next diabetes follow up   Lab Results  Component Value Date   CHOL 171 09/23/2021   HDL 38.80 (L) 09/23/2021   LDLCALC 107 (H) 07/08/2020   LDLDIRECT 104.0 09/23/2021   TRIG 204.0 (H) 09/23/2021   CHOLHDL 4 09/23/2021  Morbid obesity (HCC)    With type 2 DM,  Has lost 6 0 lbs  Using GLP 1 agonist 2. Mg semaglutide.  Next goal is > 200 lbs.       Acidosis    Noted on ER labs from June 26 but not commented.  In the setting of respiratory failure, likely lactic acid.    She is vastly improved clinically today,  Repeating BMET       Subglottic stenosis    With recent episode of tracheitis resulting in ER visit . Not admitted.  She is S/p microlaryngoscopy with balloon dilation Oct 2021 (2nd time).  Has follow up with Jenne Pane on July 5.  Currently ventilating  much better since taking steroids       Other Visit Diagnoses     Hypokalemia    -  Primary   Relevant Orders   Basic metabolic panel   Magnesium       I spent a total of  38   minutes with this patient in a face to face visit on the date of this encounter reviewing the last office visit with Dr Lorin Picket,,  her  ER visit on June 26 , her follow up with Dr Jayme Cloud, ,  patient'ss diet and eating habits, home oxygen saturations.  most recent imaging studiies   and post visit ordering of testing and therapeutics.    Follow-up: No follow-ups on file.   Sherlene Shams, MD

## 2021-11-15 NOTE — Assessment & Plan Note (Signed)
Noted on ER labs from June 26 but not commented.  In the setting of respiratory failure, likely lactic acid.    She is vastly improved clinically today,  Repeating BMET

## 2021-11-15 NOTE — Patient Instructions (Signed)
CONGRATULATIONS!      YOU 'VE ACHIEVED YOUR FIRST GOAL AND YOU LOOK GREAT!   CONTINUE WEGOVY.  WE 'LL ADD METFORMIN OR BENEFIBER IF YOU PLATEAU

## 2021-11-18 NOTE — Progress Notes (Deleted)
  [name] [address] [place] [address] Phone: [phone] Subjective:    I'm seeing this patient by the request  of:  Sherlene Shams, MD  CC:   [id number]  Lisa [name] is a 46 y.o. female coming in with complaint of back and neck pain. OMT on 10/06/2021. Trigger point in left shoulder as well. Patient states   Medications patient has been prescribed: None  Taking:         Reviewed prior external information including notes and imaging from previsou exam, outside providers and external EMR if available.   As well as notes that were available from care everywhere and other healthcare systems.  Past medical history, social, surgical and family history all reviewed in electronic medical record.  No pertanent information unless stated regarding to the chief complaint.   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   PONV (postoperative nausea and vomiting)    Pre-syncope 07/14/2014   Shortness of breath    "wheezing"   Weakness of left arm 03/03/2020    Allergies  Allergen Reactions   Latex Hives    Rash and hives     Review of Systems:  No headache, visual changes, nausea, vomiting, diarrhea, constipation, dizziness, abdominal pain, skin rash, fevers, chills, night sweats, weight loss, swollen lymph nodes, body aches, joint swelling, chest pain, shortness of breath, mood changes. POSITIVE muscle aches  Objective  There were no vitals taken for this visit.   General: No  apparent distress alert and oriented x3 mood and affect normal, dressed appropriately.  HEENT: Pupils equal, extraocular movements intact  Respiratory: Patient's speak in full sentences and does not appear short of breath  Cardiovascular: No lower extremity edema, non tender, no erythema  Gait MSK:  Back   Osteopathic findings  C2 flexed rotated and side bent right C6 flexed rotated and side bent left T3 extended rotated and side bent right inhaled rib T9 extended rotated and side bent left L2 flexed rotated and side bent right Sacrum right on right       Assessment and Plan:  No problem-specific Assessment & Plan notes found for this encounter.    Nonallopathic problems  Decision today to treat with OMT was based on Physical Exam  After verbal consent patient was treated with HVLA, ME, FPR techniques in cervical, rib, thoracic, lumbar, and sacral  areas  Patient tolerated the procedure well with improvement in symptoms  Patient given exercises, stretches and lifestyle modifications  See medications in patient instructions if given  Patient will follow up in 4-8 weeks             Note: This dictation was prepared with Dragon dictation along with smaller phrase technology. Any transcriptional errors that result from this process are unintentional.

## 2021-11-23 NOTE — Therapy (Signed)
OUTPATIENT PHYSICAL THERAPY CERVICAL EVALUATION   Patient Name: Lisa [name] MRN: [id number] DOB:[birth year], 46 y.o., female Today's Date: 11/24/2021   PT End of Session - 11/24/21 1306     Visit Number 1    Number of Visits 16    Date for PT Re-Evaluation 01/19/22    Authorization Type 1/10 eval 11/24/21    PT Start Time 1100    PT Stop Time 1159    PT Time Calculation (min) 59 min    Activity Tolerance Patient tolerated treatment well;Patient limited by pain    Behavior During Therapy [facility] for tasks assessed/performed             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   PONV (postoperative nausea and vomiting)    Pre-syncope 07/14/2014   Shortness of breath    "wheezing"   Weakness of left arm 03/03/2020   Past Surgical History:  Procedure Laterality Date   CESAREAN SECTION     x 2   MICROLARYNGOSCOPY WITH DILATION N/A 03/13/2020   Procedure: MICROLARYNGOSCOPY WITH DILATION w/JET VENT MITOMYCIN C;  Surgeon: Christia Reading, MD;  Location: [facility] OR;  Service: ENT;  Laterality: N/A;   MICROLARYNGOSCOPY WITH LASER AND BALLOON DILATION N/A 07/20/2012   Procedure: MICROLARYNGOSCOPY WITH LASER AND BALLOON DILATION;  Surgeon: Christia Reading, MD;  Location: [facility] OR;  Service: ENT;  Laterality: N/A;  WITH JET VENTURI VENTILATION   TONGUE FLAP RELEASE     widom extractions     Patient Active Problem List   Diagnosis Date Noted   Acidosis 11/15/2021   SOB (shortness of breath) 11/08/2021   Trigger point of left shoulder region  10/06/2021   Cervical radiculitis 03/03/2020   Weakness of left arm 03/03/2020   Cervicalgia 02/28/2020   Spondylosis, cervical, 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   Loud snoring 08/01/2019   B12 deficiency 08/01/2019   Eczema of left external ear 08/01/2019   Hyperlipidemia associated with type 2 diabetes mellitus (HCC) 07/31/2019   Nonallopathic lesion of cervical region 04/29/2019   Nonallopathic lesion of thoracic region 04/29/2019   Nonallopathic lesion of rib cage 04/29/2019   Cervical radiculopathy at C7 03/26/2019   Abnormal breast exam 01/24/2019   Allergic rhinitis due to pollen 08/18/2018   Type 2 diabetes mellitus without complications (HCC) 05/02/2017   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   Encounter for general adult medical examination with abnormal findings 04/29/2016   Low back pain without sciatica 04/28/2015   Morbid obesity (HCC) 04/28/2015    PCP: Sherlene Shams MD  REFERRING PROVIDER: [name] DO  REFERRING DIAG: R51.88 (ICD-10-CM) - Cervical radiculitis M25.512 (ICD-10-CM) - Trigger point of left shoulder region   THERAPY DIAG:  Cervicalgia  Pain in thoracic spine  Radiculopathy, cervical region  Rationale for Evaluation and Treatment Rehabilitation  ONSET DATE: New onset May 2023.   SUBJECTIVE:  SUBJECTIVE STATEMENT: Patient presents to physical therapy for pain radiating from neck to shoulder and from medial aspect of scapula to anterior aspect of rib.   PERTINENT HISTORY:  Patient is a 46 year old female who presents with cervical radiculitis and left shoulder trigger points. PMH includes. DDD, cervical radiculopathy, dysrythmia, GERD, HA, HTN, subglottic tracheal stenosis, PONV, DM. Patient's pain began around three years ago, had a flare up two years  ago that resulted in a lot of nerve pain in L arm. Had an MRI which showed stenosis; went to pain management and saw Maralyn Sago at [facility], got better. Had a flare up about 8 weeks ago which now is down scapula and around rib. 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   PAIN:  Are you having pain? Yes: NPRS scale: 1/10 Pain location: radiating down arm and around scapula into anterior rib Pain description: aching Aggravating factors: using L arm, work Relieving factors: ibuprofin, ice, heat   Worst pain: 5/10:  PRECAUTIONS: None  WEIGHT BEARING RESTRICTIONS No  FALLS:  Has patient fallen in last 6 months? 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: Independent  PATIENT GOALS alleviate rib pain  OBJECTIVE:   DIAGNOSTIC FINDINGS:  MRI: cervical stenosis  PATIENT SURVEYS:  NDI 34% FOTO 63% ; predicted discharge 70%   COGNITION: Overall cognitive status: Within functional limits for tasks assessed   SENSATION: WFL  POSTURE: rounded shoulders and forward head  PALPATION: Muscle guarding L upper and mid trap, L thoracic paraspinals, rhomboids  CERVICAL ROM:   Active ROM A/PROM (deg) eval  Flexion 45  Extension 65  Right lateral flexion 50  Left lateral flexion 38  Right rotation 46  Left rotation 41   (Blank rows = not tested)  Thoracic spine: limited 15% in extension  UPPER EXTREMITY ROM: UE ROM WFL  (Blank rows = not tested)  UPPER EXTREMITY MMT:  MMT Right eval Left eval  Shoulder flexion 5 4  Shoulder extension 5 4  Shoulder abduction 5 4  Shoulder adduction 5 4  Shoulder internal rotation 5 4-  Shoulder external rotation 5 4-  Middle trapezius 5 3+  Lower trapezius 5 3+  Elbow flexion 5 4-  Elbow extension 5 4-   (Blank rows = not tested)  CERVICAL SPECIAL TESTS:  Upper  limb tension test (ULTT): Positive, Spurling's test: Negative, and Distraction test: Negative  Mobilizations:  UPA/CPA thoracic spine T 8-9, 7-8, 6-7 painful, all thoracic and cervical hypomobile and tender   TODAY'S TREATMENT:  Goals, POC, HEP  Trigger Point Dry Needling (TDN), unbilled Education performed with patient regarding potential benefit of TDN. Reviewed precautions and risks with patient. Reviewed special precautions/risks over lung fields which include pneumothorax. Reviewed signs and symptoms of pneumothorax and advised pt to go to ER immediately if these symptoms develop advise them of dry needling treatment. Extensive time spent with pt to ensure full understanding of TDN risks. Pt provided verbal consent to treatment. TDN performed to  with 0.25 x 40 single needle placements with local twitch response (LTR). Pistoning technique utilized. Improved pain-free motion following intervention. Muscles targeted: L upper trap, L middle trap, L thoracic paraspinals, L wrist flexors/extensors     PATIENT EDUCATION:  Education details: goals, POC, HEP  Person educated: Patient Education method: Explanation, Demonstration, Tactile cues, and Verbal cues Education  comprehension: verbalized understanding, returned demonstration, verbal cues required, and tactile cues required   HOME EXERCISE PROGRAM: Access Code: [email or web address] URL: [email or web address] Date: 11/24/2021 Prepared by: Precious Bard  Exercises - Seated Scapular Retraction  - 1 x daily - 7 x weekly - 2 sets - 10 reps - 5 hold - Supine Lower Trunk Rotation  - 1 x daily - 7 x weekly - 2 sets - 10 reps - 5 hold - Standing 'L' Stretch at Counter  - 1 x daily - 7 x weekly - 2 sets - 10 reps - 5 hold - Seated Thoracic Lumbar Extension with Pectoralis Stretch  - 1 x daily - 7 x weekly - 2 sets - 10 reps - 5 hold  ASSESSMENT:  CLINICAL IMPRESSION: Patient is a 46 y.o. female who was seen today for physical therapy  evaluation and treatment for cervical radiation to LUE with pain in upper traps, medial border of scapula and radiating to anterior ribs. Patient has extensive tenderness and guarding with rib palpation specifically with intercostal musculature. Her mobilizations are guarded and limited. Extensive mobilization, postural correction, soft tissue lengthening techniques will assist with pain reduction. Patient will benefit from skilled physical therapy to reduce pain, improve posture, and improve quality of life.    OBJECTIVE IMPAIRMENTS decreased mobility, decreased ROM, decreased strength, hypomobility, impaired perceived functional ability, impaired flexibility, impaired UE functional use, improper body mechanics, postural dysfunction, and pain.   ACTIVITY LIMITATIONS carrying, lifting, bending, dressing, reach over head, hygiene/grooming, and caring for others  PARTICIPATION LIMITATIONS: meal prep, cleaning, laundry, interpersonal relationship, driving, shopping, community activity, occupation, and yard work  PERSONAL FACTORS Age, Fitness, Past/current experiences, Profession, Sex, Time since onset of injury/illness/exacerbation, and 3+ comorbidities: DDD, cervical radiculopathy, dysrythmia, GERD, HA, HTN, subglottic tracheal stenosis, PONV, DM  are also affecting patient's functional outcome.   REHAB POTENTIAL: Good  CLINICAL DECISION MAKING: Evolving/moderate complexity  EVALUATION COMPLEXITY: Moderate   GOALS: Goals reviewed with patient? Yes  SHORT TERM GOALS: Target date: 12/22/2021   Patient will be independent in home exercise program to improve strength/mobility for better functional independence with ADLs. Baseline: 7/12; HEP given Goal status: INITIAL  2.  Patient will report a worst pain of 4/10 on VAS in  shoulder/neck/rib  to improve tolerance with ADLs and reduced symptoms with activities.  Baseline: 7/12: 5/10  Goal status: INITIAL  LONG TERM GOALS: Target date:  01/19/2022  Patient will increase FOTO score to equal to or greater than  70%   to demonstrate statistically significant improvement in mobility and quality of life.  Baseline: 7/12: 63% Goal status: INITIAL  2.  Patient will report a worst pain of 2/10 on VAS in neck, back, rib to improve tolerance with ADLs and reduced symptoms with activities.  Baseline: 7/12: 5/10  Goal status: INITIAL  3.  Patient will reduce Neck Disability Index score to <10% to demonstrate minimal disability with ADL's including improved sleeping tolerance, sitting tolerance, etc for better mobility at home and work. Baseline: 7/12: 34% Goal status: INITIAL  4.  Patient will be able to utilize LUE while working without increase in pain and fatigue for return to PLOF Baseline: 7/12: limited use of LUE Goal status: INITIAL  5.  Patient will tolerate sitting unsupported demonstrating erect sitting posture with minimal thoracic kyphosis for 15+ minutes with maximum of 5/10 back pain to demonstrate improved back extensor strength and improved sitting tolerance.  Baseline: 7/12: kyphotic posture with forward head rounded shoulders Goal status: INITIAL  PLAN: PT FREQUENCY: 2x/week  PT DURATION: 8 weeks  PLANNED INTERVENTIONS: Therapeutic exercises, Therapeutic activity, Neuromuscular re-education, Balance training, Gait training, Patient/Family education, Joint mobilization, Joint manipulation, Dry Needling, Electrical stimulation, Spinal manipulation, Spinal mobilization, Cryotherapy, Moist heat, Taping, Traction, Ultrasound, Ionotophoresis 4mg /ml Dexamethasone, and Manual therapy  PLAN FOR NEXT SESSION: TDN to middle traps/upper traps, subscap. Posture education and performance    , Precious Bard 11/24/2021, 3:23 PM

## 2021-11-24 ENCOUNTER — Ambulatory Visit: Payer: No Typology Code available for payment source | Admitting: Family Medicine

## 2021-11-24 ENCOUNTER — Ambulatory Visit: Payer: No Typology Code available for payment source | Attending: Internal Medicine

## 2021-11-24 DIAGNOSIS — M25512 Pain in left shoulder: Secondary | ICD-10-CM | POA: Diagnosis not present

## 2021-11-24 DIAGNOSIS — M5412 Radiculopathy, cervical region: Secondary | ICD-10-CM | POA: Diagnosis present

## 2021-11-24 DIAGNOSIS — M546 Pain in thoracic spine: Secondary | ICD-10-CM | POA: Insufficient documentation

## 2021-11-24 DIAGNOSIS — M542 Cervicalgia: Secondary | ICD-10-CM | POA: Diagnosis not present

## 2021-11-24 NOTE — Telephone Encounter (Signed)
FMLA has been completed. 

## 2021-11-25 ENCOUNTER — Other Ambulatory Visit: Payer: Self-pay | Admitting: Internal Medicine

## 2021-11-25 ENCOUNTER — Other Ambulatory Visit: Payer: Self-pay

## 2021-11-25 MED ORDER — WEGOVY 2.4 MG/0.75ML ~~LOC~~ SOAJ
2.4000 mg | SUBCUTANEOUS | 0 refills | Status: DC
  Filled 2021-11-25: qty 3, 28d supply, fill #0

## 2021-11-30 NOTE — Therapy (Signed)
OUTPATIENT PHYSICAL THERAPY TREATMENT   Patient Name: Lisa [name] MRN: [id number] DOB:12-07-1975, 46 y.o., female Today's Date: 12/01/2021   PT End of Session - 12/01/21 0715     Visit Number 2    Number of Visits 16    Date for PT Re-Evaluation 01/19/22    Authorization Type 2/10 eval 11/24/21    PT Start Time 0715    PT Stop Time 0759    PT Time Calculation (min) 44 min    Activity Tolerance Patient tolerated treatment well;Patient limited by pain    Behavior During Therapy [facility] for tasks assessed/performed              Past Medical History:  Diagnosis Date   Bronchitis    hx of   Cervical radiculitis [id number]   Complication of anesthesia    COVID-19 10/2020   DDD (degenerative disc disease), cervical 02/28/2020   Dysrhythmia    "does not see cardiologist"   Family history of anesthesia complication    "morphine night terrors"   GERD (gastroesophageal reflux disease)    Headache(784.0)    "migraines"   Hypertension    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 and 2021   PONV (postoperative nausea and vomiting)    Pre-syncope 07/14/2014   Shortness of breath    "wheezing"   Weakness of left arm 03/03/2020   Past Surgical History:  Procedure Laterality Date   CESAREAN SECTION     x 2   MICROLARYNGOSCOPY WITH DILATION N/A 03/13/2020   Procedure: MICROLARYNGOSCOPY WITH DILATION w/JET VENT MITOMYCIN C;  Surgeon: Melida Quitter, MD;  Location: [place];  Service: ENT;  Laterality: N/A;   MICROLARYNGOSCOPY WITH LASER AND BALLOON DILATION N/A 07/20/2012   Procedure: MICROLARYNGOSCOPY WITH LASER AND BALLOON DILATION;  Surgeon: Melida Quitter, MD;  Location: [place];  Service: ENT;  Laterality: N/A;  WITH JET VENTURI VENTILATION   TONGUE FLAP RELEASE     widom extractions     Patient Active Problem List   Diagnosis Date Noted   Acidosis 11/15/2021   SOB (shortness of breath) 11/08/2021   Trigger point of left shoulder region 10/06/2021    Cervical radiculitis 03/03/2020   Weakness of left arm 03/03/2020   Cervicalgia 02/28/2020   Spondylosis, cervical, 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   Loud snoring 08/01/2019   B12 deficiency 08/01/2019   Eczema of left external ear 08/01/2019   Hyperlipidemia associated with type 2 diabetes mellitus ([place]) 07/31/2019   Nonallopathic lesion of cervical region 04/29/2019   Nonallopathic lesion of thoracic region 04/29/2019   Nonallopathic lesion of rib cage 04/29/2019   Cervical radiculopathy at C7 03/26/2019   Abnormal breast exam 01/24/2019   Allergic rhinitis due to pollen 08/18/2018   Type 2 diabetes mellitus without complications ([place]) [id number]   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   Encounter for general adult medical examination with abnormal findings 04/29/2016   Low back pain without sciatica 04/28/2015   Morbid obesity ([place]) 04/28/2015    PCP: Crecencio Mc MD  REFERRING PROVIDER: Lyndal Pulley DO  REFERRING DIAG: [id number] (ICD-10-CM) - Cervical radiculitis M25.512 (ICD-10-CM) - Trigger point of left shoulder region   THERAPY DIAG:  Cervicalgia  Pain in thoracic spine  Radiculopathy, cervical region  Rationale for Evaluation and Treatment Rehabilitation  ONSET DATE: New onset May 2023.   SUBJECTIVE:  SUBJECTIVE STATEMENT: Patient reports when doing scapular retractions she had tingling in her arm/hand.   PERTINENT HISTORY:  Patient is a 46 year old female who presents with cervical radiculitis and left shoulder trigger points. PMH includes. DDD, cervical radiculopathy, dysrythmia, GERD, HA, HTN, subglottic tracheal stenosis, PONV, DM. Patient's pain began around three years ago, had a flare up two years ago that resulted in a lot of nerve pain in L arm. Had an MRI which  showed stenosis; went to pain management and saw Maralyn Sago at [facility], got better. Had a flare up about 8 weeks ago which now is down scapula and around rib. 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   PAIN:  Are you having pain? Yes: NPRS scale: 1/10 Pain location: radiating down arm and around scapula into anterior rib Pain description: aching Aggravating factors: using L arm, work Relieving factors: ibuprofin, ice, heat   Worst pain: 5/10:  PRECAUTIONS: None  WEIGHT BEARING RESTRICTIONS No  FALLS:  Has patient fallen in last 6 months? 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: Independent  PATIENT GOALS alleviate rib pain  OBJECTIVE:   CERVICAL ROM:   Active ROM A/PROM (deg) eval  Flexion 45  Extension 65  Right lateral flexion 50  Left lateral flexion 38  Right rotation 46  Left rotation 41   (Blank rows = not tested)  Thoracic spine: limited 15% in extension   UPPER EXTREMITY MMT:  MMT Right eval Left eval  Shoulder flexion 5 4  Shoulder extension 5 4  Shoulder abduction 5 4  Shoulder adduction 5 4  Shoulder internal rotation 5 4-  Shoulder external rotation 5 4-  Middle trapezius 5 3+  Lower trapezius 5 3+  Elbow flexion 5 4-  Elbow extension 5 4-   (Blank rows = not tested)    TODAY'S TREATMENT:   Manual: Grade II mobilizations CPA and UPA to cervical and thoracic region J mobilization 3x30 seconds Cervical side bend with overpressure at occiput and glenohumeral joint 3x30 seconds each position Intercostal cross friction massage x 5 minutes  TherEx:  Posture training:  On half foam roller: -pectoral stretch 30 seconds -arnold to pectoral stretch 8x  -RTB overhead Y 10x   Trigger Point Dry Needling (TDN), unbilled Education performed with patient regarding potential  benefit of TDN. Reviewed precautions and risks with patient. Reviewed special precautions/risks over lung fields which include pneumothorax. Reviewed signs and symptoms of pneumothorax and advised pt to go to ER immediately if these symptoms develop advise them of dry needling treatment. Extensive time spent with pt to ensure full understanding of TDN risks. Pt provided verbal consent to treatment. TDN performed to  with 0.25 x 40 single needle placements with local twitch response (LTR). Pistoning technique utilized. Improved pain-free motion following intervention. Muscles targeted: L and R upper trap, L middle trap, L thoracic paraspinals, L bicep x 12 minutes    PATIENT EDUCATION:  Education details: goals, POC, HEP  Person educated: Patient Education method: Explanation, Demonstration, Tactile cues, and Verbal cues Education comprehension: verbalized understanding, returned demonstration, verbal cues required, and tactile cues required   HOME EXERCISE PROGRAM: Access Code: [email or web address] URL: [email or web address] Date: 11/24/2021 Prepared by: Precious Bard  Exercises - Seated Scapular Retraction  - 1 x daily - 7 x weekly - 2 sets - 10 reps -  5 hold - Supine Lower Trunk Rotation  - 1 x daily - 7 x weekly - 2 sets - 10 reps - 5 hold - Standing 'L' Stretch at Counter  - 1 x daily - 7 x weekly - 2 sets - 10 reps - 5 hold - Seated Thoracic Lumbar Extension with Pectoralis Stretch  - 1 x daily - 7 x weekly - 2 sets - 10 reps - 5 hold  ASSESSMENT:  CLINICAL IMPRESSION: Patient presents with excellent motivation. Has increased radiating symptoms down L arm yesterday but none present at time of session. Significant trigger points released in bilateral upper traps. Patient introduced to cross friction STM in intercostal region with tenderness but tolerated well.  Patient will benefit from skilled physical therapy to reduce pain, improve posture, and improve quality of life.     OBJECTIVE IMPAIRMENTS decreased mobility, decreased ROM, decreased strength, hypomobility, impaired perceived functional ability, impaired flexibility, impaired UE functional use, improper body mechanics, postural dysfunction, and pain.   ACTIVITY LIMITATIONS carrying, lifting, bending, dressing, reach over head, hygiene/grooming, and caring for others  PARTICIPATION LIMITATIONS: meal prep, cleaning, laundry, interpersonal relationship, driving, shopping, community activity, occupation, and yard work  PERSONAL FACTORS Age, Fitness, Past/current experiences, Profession, Sex, Time since onset of injury/illness/exacerbation, and 3+ comorbidities: DDD, cervical radiculopathy, dysrythmia, GERD, HA, HTN, subglottic tracheal stenosis, PONV, DM  are also affecting patient's functional outcome.   REHAB POTENTIAL: Good  CLINICAL DECISION MAKING: Evolving/moderate complexity  EVALUATION COMPLEXITY: Moderate   GOALS: Goals reviewed with patient? Yes  SHORT TERM GOALS: Target date: 12/28/2021   Patient will be independent in home exercise program to improve strength/mobility for better functional independence with ADLs. Baseline: 7/12; HEP given Goal status: INITIAL  2.  Patient will report a worst pain of 4/10 on VAS in  shoulder/neck/rib  to improve tolerance with ADLs and reduced symptoms with activities.  Baseline: 7/12: 5/10  Goal status: INITIAL  LONG TERM GOALS: Target date: 01/25/2022  Patient will increase FOTO score to equal to or greater than  70%   to demonstrate statistically significant improvement in mobility and quality of life.  Baseline: 7/12: 63% Goal status: INITIAL  2.  Patient will report a worst pain of 2/10 on VAS in neck, back, rib to improve tolerance with ADLs and reduced symptoms with activities.  Baseline: 7/12: 5/10  Goal status: INITIAL  3.  Patient will reduce Neck Disability Index score to <10% to demonstrate minimal disability with ADL's including  improved sleeping tolerance, sitting tolerance, etc for better mobility at home and work. Baseline: 7/12: 34% Goal status: INITIAL  4.  Patient will be able to utilize LUE while working without increase in pain and fatigue for return to PLOF Baseline: 7/12: limited use of LUE Goal status: INITIAL  5.  Patient will tolerate sitting unsupported demonstrating erect sitting posture with minimal thoracic kyphosis for 15+ minutes with maximum of 5/10 back pain to demonstrate improved back extensor strength and improved sitting tolerance.  Baseline: 7/12: kyphotic posture with forward head rounded shoulders Goal status: INITIAL     PLAN: PT FREQUENCY: 2x/week  PT DURATION: 8 weeks  PLANNED INTERVENTIONS: Therapeutic exercises, Therapeutic activity, Neuromuscular re-education, Balance training, Gait training, Patient/Family education, Joint mobilization, Joint manipulation, Dry Needling, Electrical stimulation, Spinal manipulation, Spinal mobilization, Cryotherapy, Moist heat, Taping, Traction, Ultrasound, Ionotophoresis 4mg /ml Dexamethasone, and Manual therapy  PLAN FOR NEXT SESSION: TDN to middle traps/upper traps, subscap. Posture education and performance    , Precious Bard 12/01/2021, 8:16 AM

## 2021-12-01 ENCOUNTER — Ambulatory Visit: Payer: No Typology Code available for payment source

## 2021-12-01 DIAGNOSIS — M5412 Radiculopathy, cervical region: Secondary | ICD-10-CM

## 2021-12-01 DIAGNOSIS — M542 Cervicalgia: Secondary | ICD-10-CM | POA: Diagnosis not present

## 2021-12-01 DIAGNOSIS — M546 Pain in thoracic spine: Secondary | ICD-10-CM

## 2021-12-05 ENCOUNTER — Other Ambulatory Visit: Payer: Self-pay | Admitting: Internal Medicine

## 2021-12-05 MED ORDER — WEGOVY 2.4 MG/0.75ML ~~LOC~~ SOAJ
2.4000 mg | SUBCUTANEOUS | 0 refills | Status: DC
  Filled 2021-12-05 – 2021-12-13 (×2): qty 3, 28d supply, fill #0

## 2021-12-06 ENCOUNTER — Other Ambulatory Visit: Payer: Self-pay

## 2021-12-07 NOTE — Therapy (Signed)
OUTPATIENT PHYSICAL THERAPY TREATMENT   Patient Name: Lisa [name] MRN: [id number] DOB:December 19, 1975, 46 y.o., female Today's Date: 12/08/2021   PT End of Session - 12/08/21 0716     Visit Number 3    Number of Visits 16    Date for PT Re-Evaluation 01/19/22    Authorization Type 3/10 eval 11/24/21    PT Start Time 0715    PT Stop Time 0759    PT Time Calculation (min) 44 min    Activity Tolerance Patient tolerated treatment well;Patient limited by pain    Behavior During Therapy [facility] for tasks assessed/performed               Past Medical History:  Diagnosis Date   Bronchitis    hx of   Cervical radiculitis [id number]   Complication of anesthesia    COVID-19 10/2020   DDD (degenerative disc disease), cervical 02/28/2020   Dysrhythmia    "does not see cardiologist"   Family history of anesthesia complication    "morphine night terrors"   GERD (gastroesophageal reflux disease)    Headache(784.0)    "migraines"   Hypertension    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 and 2021   PONV (postoperative nausea and vomiting)    Pre-syncope 07/14/2014   Shortness of breath    "wheezing"   Weakness of left arm 03/03/2020   Past Surgical History:  Procedure Laterality Date   CESAREAN SECTION     x 2   MICROLARYNGOSCOPY WITH DILATION N/A 03/13/2020   Procedure: MICROLARYNGOSCOPY WITH DILATION w/JET VENT MITOMYCIN C;  Surgeon: Melida Quitter, MD;  Location: [place];  Service: ENT;  Laterality: N/A;   MICROLARYNGOSCOPY WITH LASER AND BALLOON DILATION N/A 07/20/2012   Procedure: MICROLARYNGOSCOPY WITH LASER AND BALLOON DILATION;  Surgeon: Melida Quitter, MD;  Location: [place];  Service: ENT;  Laterality: N/A;  WITH JET VENTURI VENTILATION   TONGUE FLAP RELEASE     widom extractions     Patient Active Problem List   Diagnosis Date Noted   Acidosis 11/15/2021   SOB (shortness of breath) 11/08/2021   Trigger point of left shoulder region  10/06/2021   Cervical radiculitis 03/03/2020   Weakness of left arm 03/03/2020   Cervicalgia 02/28/2020   Spondylosis, cervical, 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   Loud snoring 08/01/2019   B12 deficiency 08/01/2019   Eczema of left external ear 08/01/2019   Hyperlipidemia associated with type 2 diabetes mellitus ([place]) 07/31/2019   Nonallopathic lesion of cervical region 04/29/2019   Nonallopathic lesion of thoracic region 04/29/2019   Nonallopathic lesion of rib cage 04/29/2019   Cervical radiculopathy at C7 03/26/2019   Abnormal breast exam 01/24/2019   Allergic rhinitis due to pollen 08/18/2018   Type 2 diabetes mellitus without complications ([place]) [id number]   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   Encounter for general adult medical examination with abnormal findings 04/29/2016   Low back pain without sciatica 04/28/2015   Morbid obesity ([place]) 04/28/2015    PCP: Crecencio Mc MD  REFERRING PROVIDER: Lyndal Pulley DO  REFERRING DIAG: [id number] (ICD-10-CM) - Cervical radiculitis M25.512 (ICD-10-CM) - Trigger point of left shoulder region   THERAPY DIAG:  Cervicalgia  Pain in thoracic spine  Radiculopathy, cervical region  Rationale for Evaluation and Treatment Rehabilitation  ONSET DATE: New onset May 2023.   SUBJECTIVE:  SUBJECTIVE STATEMENT: Patient reports she overdid it over the weekend canning and cleaning. Had a massage Thursday.   PERTINENT HISTORY:  Patient is a 46 year old female who presents with cervical radiculitis and left shoulder trigger points. PMH includes. DDD, cervical radiculopathy, dysrythmia, GERD, HA, HTN, subglottic tracheal stenosis, PONV, DM. Patient's pain began around three years ago, had a flare up two years ago that resulted in a lot of nerve pain in L  arm. Had an MRI which showed stenosis; went to pain management and saw Maralyn Sago at [facility], got better. Had a flare up about 8 weeks ago which now is down scapula and around rib. 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   PAIN:  Are you having pain? Yes: NPRS scale: 2/10 Pain location: radiating down arm and around scapula into anterior rib Pain description: aching Aggravating factors: using L arm, work Relieving factors: ibuprofin, ice, heat   Worst pain: 5/10:  PRECAUTIONS: None  WEIGHT BEARING RESTRICTIONS No  FALLS:  Has patient fallen in last 6 months? 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: Independent  PATIENT GOALS alleviate rib pain  OBJECTIVE:   CERVICAL ROM:   Active ROM A/PROM (deg) eval  Flexion 45  Extension 65  Right lateral flexion 50  Left lateral flexion 38  Right rotation 46  Left rotation 41   (Blank rows = not tested)  Thoracic spine: limited 15% in extension   UPPER EXTREMITY MMT:  MMT Right eval Left eval  Shoulder flexion 5 4  Shoulder extension 5 4  Shoulder abduction 5 4  Shoulder adduction 5 4  Shoulder internal rotation 5 4-  Shoulder external rotation 5 4-  Middle trapezius 5 3+  Lower trapezius 5 3+  Elbow flexion 5 4-  Elbow extension 5 4-   (Blank rows = not tested)    TODAY'S TREATMENT:   Manual: Grade II mobilizations CPA and UPA to cervical and thoracic region J mobilization 3x30 seconds Intercostal cross friction massage x 12 minutes   Trigger Point Dry Needling (TDN), unbilled Education performed with patient regarding potential benefit of TDN. Reviewed precautions and risks with patient. Reviewed special precautions/risks over lung fields which include pneumothorax. Reviewed signs and symptoms of pneumothorax and advised pt to go to ER  immediately if these symptoms develop advise them of dry needling treatment. Extensive time spent with pt to ensure full understanding of TDN risks. Pt provided verbal consent to treatment. TDN performed to  with 0.25 x 40 single needle placements with local twitch response (LTR). Pistoning technique utilized. Improved pain-free motion following intervention. Muscles targeted: L and R upper trap, L middle trap, L thoracic paraspinals,x 16 minutes    PATIENT EDUCATION:  Education details: goals, POC, HEP  Person educated: Patient Education method: Explanation, Demonstration, Tactile cues, and Verbal cues Education comprehension: verbalized understanding, returned demonstration, verbal cues required, and tactile cues required   HOME EXERCISE PROGRAM: Access Code: [email or web address] URL: [email or web address] Date: 11/24/2021 Prepared by: Precious Bard  Exercises - Seated Scapular Retraction  - 1 x daily - 7 x weekly - 2 sets - 10 reps - 5 hold - Supine Lower Trunk Rotation  - 1 x daily - 7 x weekly - 2 sets - 10 reps - 5 hold - Standing 'L' Stretch at Counter  - 1 x daily - 7  x weekly - 2 sets - 10 reps - 5 hold - Seated Thoracic Lumbar Extension with Pectoralis Stretch  - 1 x daily - 7 x weekly - 2 sets - 10 reps - 5 hold  ASSESSMENT:  CLINICAL IMPRESSION: Patient presents with excellent motivation. Extensive trigger points in bilateral upper traps present requiring focused TDN for release. Patient has multiple adhesions along intercostal spaces.  Patient will benefit from skilled physical therapy to reduce pain, improve posture, and improve quality of life.    OBJECTIVE IMPAIRMENTS decreased mobility, decreased ROM, decreased strength, hypomobility, impaired perceived functional ability, impaired flexibility, impaired UE functional use, improper body mechanics, postural dysfunction, and pain.   ACTIVITY LIMITATIONS carrying, lifting, bending, dressing, reach over head,  hygiene/grooming, and caring for others  PARTICIPATION LIMITATIONS: meal prep, cleaning, laundry, interpersonal relationship, driving, shopping, community activity, occupation, and yard work  PERSONAL FACTORS Age, Fitness, Past/current experiences, Profession, Sex, Time since onset of injury/illness/exacerbation, and 3+ comorbidities: DDD, cervical radiculopathy, dysrythmia, GERD, HA, HTN, subglottic tracheal stenosis, PONV, DM  are also affecting patient's functional outcome.   REHAB POTENTIAL: Good  CLINICAL DECISION MAKING: Evolving/moderate complexity  EVALUATION COMPLEXITY: Moderate   GOALS: Goals reviewed with patient? Yes  SHORT TERM GOALS: Target date: 12/28/2021   Patient will be independent in home exercise program to improve strength/mobility for better functional independence with ADLs. Baseline: 7/12; HEP given Goal status: INITIAL  2.  Patient will report a worst pain of 4/10 on VAS in  shoulder/neck/rib  to improve tolerance with ADLs and reduced symptoms with activities.  Baseline: 7/12: 5/10  Goal status: INITIAL  LONG TERM GOALS: Target date: 01/25/2022  Patient will increase FOTO score to equal to or greater than  70%   to demonstrate statistically significant improvement in mobility and quality of life.  Baseline: 7/12: 63% Goal status: INITIAL  2.  Patient will report a worst pain of 2/10 on VAS in neck, back, rib to improve tolerance with ADLs and reduced symptoms with activities.  Baseline: 7/12: 5/10  Goal status: INITIAL  3.  Patient will reduce Neck Disability Index score to <10% to demonstrate minimal disability with ADL's including improved sleeping tolerance, sitting tolerance, etc for better mobility at home and work. Baseline: 7/12: 34% Goal status: INITIAL  4.  Patient will be able to utilize LUE while working without increase in pain and fatigue for return to PLOF Baseline: 7/12: limited use of LUE Goal status: INITIAL  5.  Patient will  tolerate sitting unsupported demonstrating erect sitting posture with minimal thoracic kyphosis for 15+ minutes with maximum of 5/10 back pain to demonstrate improved back extensor strength and improved sitting tolerance.  Baseline: 7/12: kyphotic posture with forward head rounded shoulders Goal status: INITIAL     PLAN: PT FREQUENCY: 2x/week  PT DURATION: 8 weeks  PLANNED INTERVENTIONS: Therapeutic exercises, Therapeutic activity, Neuromuscular re-education, Balance training, Gait training, Patient/Family education, Joint mobilization, Joint manipulation, Dry Needling, Electrical stimulation, Spinal manipulation, Spinal mobilization, Cryotherapy, Moist heat, Taping, Traction, Ultrasound, Ionotophoresis 4mg /ml Dexamethasone, and Manual therapy  PLAN FOR NEXT SESSION: TDN to middle traps/upper traps, subscap. Posture education and performance    , Precious Bard 12/08/2021, 8:20 AM

## 2021-12-08 ENCOUNTER — Ambulatory Visit: Payer: No Typology Code available for payment source

## 2021-12-08 DIAGNOSIS — M5412 Radiculopathy, cervical region: Secondary | ICD-10-CM

## 2021-12-08 DIAGNOSIS — M542 Cervicalgia: Secondary | ICD-10-CM | POA: Diagnosis not present

## 2021-12-08 DIAGNOSIS — M546 Pain in thoracic spine: Secondary | ICD-10-CM

## 2021-12-13 ENCOUNTER — Other Ambulatory Visit: Payer: Self-pay

## 2021-12-14 NOTE — Therapy (Signed)
OUTPATIENT PHYSICAL THERAPY TREATMENT   Patient Name: Lisa [name] MRN: [id number] DOB:Oct 04, 1975, 46 y.o., female Today's Date: 12/15/2021   PT End of Session - 12/15/21 0719     Visit Number 4    Number of Visits 16    Date for PT Re-Evaluation 01/19/22    Authorization Type 4/10 eval 11/24/21    PT Start Time 0717    PT Stop Time 0759    PT Time Calculation (min) 42 min    Activity Tolerance Patient tolerated treatment well;Patient limited by pain    Behavior During Therapy [facility] for tasks assessed/performed                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   PONV (postoperative nausea and vomiting)    Pre-syncope 07/14/2014   Shortness of breath    "wheezing"   Weakness of left arm 03/03/2020   Past Surgical History:  Procedure Laterality Date   CESAREAN SECTION     x 2   MICROLARYNGOSCOPY WITH DILATION N/A 03/13/2020   Procedure: MICROLARYNGOSCOPY WITH DILATION w/JET VENT MITOMYCIN C;  Surgeon: Christia Reading, MD;  Location: [facility];  Service: ENT;  Laterality: N/A;   MICROLARYNGOSCOPY WITH LASER AND BALLOON DILATION N/A 07/20/2012   Procedure: MICROLARYNGOSCOPY WITH LASER AND BALLOON DILATION;  Surgeon: Christia Reading, MD;  Location: [facility];  Service: ENT;  Laterality: N/A;  WITH JET VENTURI VENTILATION   TONGUE FLAP RELEASE     widom extractions     Patient Active Problem List   Diagnosis Date Noted   Acidosis 11/15/2021   SOB (shortness of breath) 11/08/2021   Trigger point of left shoulder region  10/06/2021   Cervical radiculitis 03/03/2020   Weakness of left arm 03/03/2020   Cervicalgia 02/28/2020   Spondylosis, cervical, 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   Loud snoring 08/01/2019   B12 deficiency 08/01/2019   Eczema of left external ear 08/01/2019   Hyperlipidemia associated with type 2 diabetes mellitus ([facility]) 07/31/2019   Nonallopathic lesion of cervical region 04/29/2019   Nonallopathic lesion of thoracic region 04/29/2019   Nonallopathic lesion of rib cage 04/29/2019   Cervical radiculopathy at C7 03/26/2019   Abnormal breast exam 01/24/2019   Allergic rhinitis due to pollen 08/18/2018   Type 2 diabetes mellitus without complications ([facility]) 05/02/2017   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   Encounter for general adult medical examination with abnormal findings 04/29/2016   Low back pain without sciatica 04/28/2015   Morbid obesity ([facility]) 04/28/2015    PCP: Sherlene Shams MD  REFERRING PROVIDER: [name] DO  REFERRING DIAG: Q25.95 (ICD-10-CM) - Cervical radiculitis M25.512 (ICD-10-CM) - Trigger point of left shoulder region   THERAPY DIAG:  Cervicalgia  Pain in thoracic spine  Radiculopathy, cervical region  Rationale for Evaluation and Treatment Rehabilitation  ONSET DATE: New onset May 2023.   SUBJECTIVE:  SUBJECTIVE STATEMENT: Patient reports she is feeling more stiff. Has been intermittent compliant with HEP. Heading on vacation Friday.   PERTINENT HISTORY:  Patient is a 46 year old female who presents with cervical radiculitis and left shoulder trigger points. PMH includes. DDD, cervical radiculopathy, dysrythmia, GERD, HA, HTN, subglottic tracheal stenosis, PONV, DM. Patient's pain began around three years ago, had a flare up two years ago that resulted in a lot  of nerve pain in L arm. Had an MRI which showed stenosis; went to pain management and saw Maralyn Sago at [facility] [employer], got better. Had a flare up about 8 weeks ago which now is down scapula and around rib. 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   PAIN:  Are you having pain? Yes: NPRS scale: 4/10 Pain location: radiating down arm and around scapula into anterior rib Pain description: aching Aggravating factors: using L arm, work Relieving factors: ibuprofin, ice, heat   Worst pain: 5/10:  PRECAUTIONS: None  WEIGHT BEARING RESTRICTIONS No  FALLS:  Has patient fallen in last 6 months? 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: Independent  PATIENT GOALS alleviate rib pain  OBJECTIVE:   CERVICAL ROM:   Active ROM A/PROM (deg) eval  Flexion 45  Extension 65  Right lateral flexion 50  Left lateral flexion 38  Right rotation 46  Left rotation 41   (Blank rows = not tested)  Thoracic spine: limited 15% in extension   UPPER EXTREMITY MMT:  MMT Right eval Left eval  Shoulder flexion 5 4  Shoulder extension 5 4  Shoulder abduction 5 4  Shoulder adduction 5 4  Shoulder internal rotation 5 4-  Shoulder external rotation 5 4-  Middle trapezius 5 3+  Lower trapezius 5 3+  Elbow flexion 5 4-  Elbow extension 5 4-   (Blank rows = not tested)    TODAY'S TREATMENT:   Manual: Grade II mobilizations CPA and UPA to cervical and thoracic region J mobilization 3x30 seconds Intercostal cross friction massage x 12 minutes   Trigger Point Dry Needling (TDN), unbilled Education performed with patient regarding potential benefit of TDN. Reviewed precautions and risks with patient. Reviewed special precautions/risks over lung fields which include pneumothorax. Reviewed signs and symptoms of pneumothorax and advised  pt to go to ER immediately if these symptoms develop advise them of dry needling treatment. Extensive time spent with pt to ensure full understanding of TDN risks. Pt provided verbal consent to treatment. TDN performed to  with 0.25 x 40 single needle placements with local twitch response (LTR). Pistoning technique utilized. Improved pain-free motion following intervention. Muscles targeted: L and R upper trap, L middle trap, L thoracic paraspinals,x 16 minutes    PATIENT EDUCATION:  Education details: goals, POC, HEP  Person educated: Patient Education method: Explanation, Demonstration, Tactile cues, and Verbal cues Education comprehension: verbalized understanding, returned demonstration, verbal cues required, and tactile cues required   HOME EXERCISE PROGRAM: Access Code: [email or web address] URL: [email or web address] Date: 11/24/2021 Prepared by: Precious Bard  Exercises - Seated Scapular Retraction  - 1 x daily - 7 x weekly - 2 sets - 10 reps - 5 hold - Supine Lower Trunk Rotation  - 1 x daily - 7 x weekly - 2 sets - 10 reps - 5 hold - Standing 'L' Stretch at Counter  - 1 x daily -  7 x weekly - 2 sets - 10 reps - 5 hold - Seated Thoracic Lumbar Extension with Pectoralis Stretch  - 1 x daily - 7 x weekly - 2 sets - 10 reps - 5 hold  ASSESSMENT:  CLINICAL IMPRESSION: Patient presents with excellent motivation. She has sanguification trigger points along paraspinal region and upper traps. STM along rib cage painful but improves with prolonged performance.  Hypomobility of spine reduced with repeated mobilization. Patient will benefit from skilled physical therapy to reduce pain, improve posture, and improve quality of life.    OBJECTIVE IMPAIRMENTS decreased mobility, decreased ROM, decreased strength, hypomobility, impaired perceived functional ability, impaired flexibility, impaired UE functional use, improper body mechanics, postural dysfunction, and pain.   ACTIVITY  LIMITATIONS carrying, lifting, bending, dressing, reach over head, hygiene/grooming, and caring for others  PARTICIPATION LIMITATIONS: meal prep, cleaning, laundry, interpersonal relationship, driving, shopping, community activity, occupation, and yard work  PERSONAL FACTORS Age, Fitness, Past/current experiences, Profession, Sex, Time since onset of injury/illness/exacerbation, and 3+ comorbidities: DDD, cervical radiculopathy, dysrythmia, GERD, HA, HTN, subglottic tracheal stenosis, PONV, DM  are also affecting patient's functional outcome.   REHAB POTENTIAL: Good  CLINICAL DECISION MAKING: Evolving/moderate complexity  EVALUATION COMPLEXITY: Moderate   GOALS: Goals reviewed with patient? Yes  SHORT TERM GOALS: Target date: 12/28/2021   Patient will be independent in home exercise program to improve strength/mobility for better functional independence with ADLs. Baseline: 7/12; HEP given Goal status: INITIAL  2.  Patient will report a worst pain of 4/10 on VAS in  shoulder/neck/rib  to improve tolerance with ADLs and reduced symptoms with activities.  Baseline: 7/12: 5/10  Goal status: INITIAL  LONG TERM GOALS: Target date: 01/25/2022  Patient will increase FOTO score to equal to or greater than  70%   to demonstrate statistically significant improvement in mobility and quality of life.  Baseline: 7/12: 63% Goal status: INITIAL  2.  Patient will report a worst pain of 2/10 on VAS in neck, back, rib to improve tolerance with ADLs and reduced symptoms with activities.  Baseline: 7/12: 5/10  Goal status: INITIAL  3.  Patient will reduce Neck Disability Index score to <10% to demonstrate minimal disability with ADL's including improved sleeping tolerance, sitting tolerance, etc for better mobility at home and work. Baseline: 7/12: 34% Goal status: INITIAL  4.  Patient will be able to utilize LUE while working without increase in pain and fatigue for return to PLOF Baseline: 7/12:  limited use of LUE Goal status: INITIAL  5.  Patient will tolerate sitting unsupported demonstrating erect sitting posture with minimal thoracic kyphosis for 15+ minutes with maximum of 5/10 back pain to demonstrate improved back extensor strength and improved sitting tolerance.  Baseline: 7/12: kyphotic posture with forward head rounded shoulders Goal status: INITIAL     PLAN: PT FREQUENCY: 2x/week  PT DURATION: 8 weeks  PLANNED INTERVENTIONS: Therapeutic exercises, Therapeutic activity, Neuromuscular re-education, Balance training, Gait training, Patient/Family education, Joint mobilization, Joint manipulation, Dry Needling, Electrical stimulation, Spinal manipulation, Spinal mobilization, Cryotherapy, Moist heat, Taping, Traction, Ultrasound, Ionotophoresis 4mg /ml Dexamethasone, and Manual therapy  PLAN FOR NEXT SESSION: TDN to middle traps/upper traps, subscap. Posture education and performance    Janna Arch, [place] 12/15/2021, 8:16 AM

## 2021-12-15 ENCOUNTER — Other Ambulatory Visit: Payer: Self-pay

## 2021-12-15 ENCOUNTER — Ambulatory Visit: Payer: No Typology Code available for payment source | Attending: Emergency Medicine

## 2021-12-15 DIAGNOSIS — M546 Pain in thoracic spine: Secondary | ICD-10-CM | POA: Diagnosis present

## 2021-12-15 DIAGNOSIS — M542 Cervicalgia: Secondary | ICD-10-CM | POA: Insufficient documentation

## 2021-12-15 DIAGNOSIS — M5412 Radiculopathy, cervical region: Secondary | ICD-10-CM | POA: Insufficient documentation

## 2021-12-17 ENCOUNTER — Other Ambulatory Visit: Payer: Self-pay

## 2021-12-28 NOTE — Therapy (Signed)
OUTPATIENT PHYSICAL THERAPY TREATMENT   Patient Name: Lisa [name] MRN: [id number] DOB:01-13-1976, 46 y.o., female Today's Date: 12/29/2021   PT End of Session - 12/29/21 0719     Visit Number 5    Number of Visits 16    Date for PT Re-Evaluation 01/19/22    Authorization Type 5/10 eval 11/24/21    PT Start Time 0719    PT Stop Time 0759    PT Time Calculation (min) 40 min    Activity Tolerance Patient tolerated treatment well;Patient limited by pain    Behavior During Therapy [facility] for tasks assessed/performed                 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   PONV (postoperative nausea and vomiting)    Pre-syncope 07/14/2014   Shortness of breath    "wheezing"   Weakness of left arm 03/03/2020   Past Surgical History:  Procedure Laterality Date   CESAREAN SECTION     x 2   MICROLARYNGOSCOPY WITH DILATION N/A 03/13/2020   Procedure: MICROLARYNGOSCOPY WITH DILATION w/JET VENT MITOMYCIN C;  Surgeon: Christia Reading, MD;  Location: [facility] OR;  Service: ENT;  Laterality: N/A;   MICROLARYNGOSCOPY WITH LASER AND BALLOON DILATION N/A 07/20/2012   Procedure: MICROLARYNGOSCOPY WITH LASER AND BALLOON DILATION;  Surgeon: Christia Reading, MD;  Location: [facility] OR;  Service: ENT;  Laterality: N/A;  WITH JET VENTURI VENTILATION   TONGUE FLAP RELEASE     widom extractions     Patient Active Problem List   Diagnosis Date Noted   Acidosis 11/15/2021   SOB (shortness of breath) 11/08/2021   Trigger point of left shoulder region  10/06/2021   Cervical radiculitis 03/03/2020   Weakness of left arm 03/03/2020   Cervicalgia 02/28/2020   Spondylosis, cervical, 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   Loud snoring 08/01/2019   B12 deficiency 08/01/2019   Eczema of left external ear 08/01/2019   Hyperlipidemia associated with type 2 diabetes mellitus (HCC) 07/31/2019   Nonallopathic lesion of cervical region 04/29/2019   Nonallopathic lesion of thoracic region 04/29/2019   Nonallopathic lesion of rib cage 04/29/2019   Cervical radiculopathy at C7 03/26/2019   Abnormal breast exam 01/24/2019   Allergic rhinitis due to pollen 08/18/2018   Type 2 diabetes mellitus without complications (HCC) 05/02/2017   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   Encounter for general adult medical examination with abnormal findings 04/29/2016   Low back pain without sciatica 04/28/2015   Morbid obesity (HCC) 04/28/2015    PCP: Sherlene Shams MD  REFERRING PROVIDER: [name] DO  REFERRING DIAG: I29.79 (ICD-10-CM) - Cervical radiculitis M25.512 (ICD-10-CM) - Trigger point of left shoulder region   THERAPY DIAG:  Cervicalgia  Pain in thoracic spine  Radiculopathy, cervical region  Rationale for Evaluation and Treatment Rehabilitation  ONSET DATE: New onset May 2023.   SUBJECTIVE:  SUBJECTIVE STATEMENT: Patient reports having pain in her lateral rib area when working. Had a good trip.   PERTINENT HISTORY:  Patient is a 46 year old female who presents with cervical radiculitis and left shoulder trigger points. PMH includes. DDD, cervical radiculopathy, dysrythmia, GERD, HA, HTN, subglottic tracheal stenosis, PONV, DM. Patient's pain began around three years ago, had a flare up two years ago that resulted in a lot of nerve pain in L arm. Had an  MRI which showed stenosis; went to pain management and saw Maralyn Sago at [facility], got better. Had a flare up about 8 weeks ago which now is down scapula and around rib. 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   PAIN:  Are you having pain? Yes: NPRS scale: 4/10 Pain location: radiating down arm and around scapula into anterior rib Pain description: aching Aggravating factors: using L arm, work Relieving factors: ibuprofin, ice, heat   Worst pain: 5/10:  PRECAUTIONS: None  WEIGHT BEARING RESTRICTIONS No  FALLS:  Has patient fallen in last 6 months? 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: Independent  PATIENT GOALS alleviate rib pain  OBJECTIVE:   CERVICAL ROM:   Active ROM A/PROM (deg) eval  Flexion 45  Extension 65  Right lateral flexion 50  Left lateral flexion 38  Right rotation 46  Left rotation 41   (Blank rows = not tested)  Thoracic spine: limited 15% in extension   UPPER EXTREMITY MMT:  MMT Right eval Left eval  Shoulder flexion 5 4  Shoulder extension 5 4  Shoulder abduction 5 4  Shoulder adduction 5 4  Shoulder internal rotation 5 4-  Shoulder external rotation 5 4-  Middle trapezius 5 3+  Lower trapezius 5 3+  Elbow flexion 5 4-  Elbow extension 5 4-   (Blank rows = not tested)    TODAY'S TREATMENT:   Manual: Grade II mobilizations CPA and UPA to cervical and thoracic region J mobilization 3x30 seconds Intercostal cross friction massage x 12 minutes sidelying  On half foam roller:  -pectoral stretch 30 seconds -arnold stretch 10x    Trigger Point Dry Needling (TDN), unbilled Education performed with patient regarding potential benefit of TDN. Reviewed precautions and risks with patient. Reviewed special precautions/risks over lung fields which include pneumothorax.  Reviewed signs and symptoms of pneumothorax and advised pt to go to ER immediately if these symptoms develop advise them of dry needling treatment. Extensive time spent with pt to ensure full understanding of TDN risks. Pt provided verbal consent to treatment. TDN performed to  with 0.25 x 40 single needle placements with local twitch response (LTR). Pistoning technique utilized. Improved pain-free motion following intervention. Muscles targeted: L and R upper trap, L subscap, L thoracic paraspinals,x 12 minutes    PATIENT EDUCATION:  Education details: goals, POC, HEP  Person educated: Patient Education method: Explanation, Demonstration, Tactile cues, and Verbal cues Education comprehension: verbalized understanding, returned demonstration, verbal cues required, and tactile cues required   HOME EXERCISE PROGRAM: Access Code: [email or web address] URL: [email or web address] Date: 11/24/2021 Prepared by: Precious Bard  Exercises - Seated Scapular Retraction  - 1 x daily - 7 x weekly - 2 sets - 10 reps - 5 hold - Supine Lower Trunk Rotation  - 1 x daily - 7 x weekly - 2 sets - 10 reps - 5  hold - Standing 'L' Stretch at Counter  - 1 x daily - 7 x weekly - 2 sets - 10 reps - 5 hold - Seated Thoracic Lumbar Extension with Pectoralis Stretch  - 1 x daily - 7 x weekly - 2 sets - 10 reps - 5 hold  ASSESSMENT:  CLINICAL IMPRESSION: Patient subscap released this session with significant trigger points. When performing lateral soft tissue release to intercostal rib space patient has referral pain pattern to armpit.  Patient educated on chin tucks and posterior pelvic tilts during work for pain reduction. Patient will benefit from skilled physical therapy to reduce pain, improve posture, and improve quality of life.    OBJECTIVE IMPAIRMENTS decreased mobility, decreased ROM, decreased strength, hypomobility, impaired perceived functional ability, impaired flexibility, impaired UE functional use,  improper body mechanics, postural dysfunction, and pain.   ACTIVITY LIMITATIONS carrying, lifting, bending, dressing, reach over head, hygiene/grooming, and caring for others  PARTICIPATION LIMITATIONS: meal prep, cleaning, laundry, interpersonal relationship, driving, shopping, community activity, occupation, and yard work  PERSONAL FACTORS Age, Fitness, Past/current experiences, Profession, Sex, Time since onset of injury/illness/exacerbation, and 3+ comorbidities: DDD, cervical radiculopathy, dysrythmia, GERD, HA, HTN, subglottic tracheal stenosis, PONV, DM  are also affecting patient's functional outcome.   REHAB POTENTIAL: Good  CLINICAL DECISION MAKING: Evolving/moderate complexity  EVALUATION COMPLEXITY: Moderate   GOALS: Goals reviewed with patient? Yes  SHORT TERM GOALS: Target date: 12/28/2021   Patient will be independent in home exercise program to improve strength/mobility for better functional independence with ADLs. Baseline: 7/12; HEP given Goal status: INITIAL  2.  Patient will report a worst pain of 4/10 on VAS in  shoulder/neck/rib  to improve tolerance with ADLs and reduced symptoms with activities.  Baseline: 7/12: 5/10  Goal status: INITIAL  LONG TERM GOALS: Target date: 01/25/2022  Patient will increase FOTO score to equal to or greater than  70%   to demonstrate statistically significant improvement in mobility and quality of life.  Baseline: 7/12: 63% Goal status: INITIAL  2.  Patient will report a worst pain of 2/10 on VAS in neck, back, rib to improve tolerance with ADLs and reduced symptoms with activities.  Baseline: 7/12: 5/10  Goal status: INITIAL  3.  Patient will reduce Neck Disability Index score to <10% to demonstrate minimal disability with ADL's including improved sleeping tolerance, sitting tolerance, etc for better mobility at home and work. Baseline: 7/12: 34% Goal status: INITIAL  4.  Patient will be able to utilize LUE while working  without increase in pain and fatigue for return to PLOF Baseline: 7/12: limited use of LUE Goal status: INITIAL  5.  Patient will tolerate sitting unsupported demonstrating erect sitting posture with minimal thoracic kyphosis for 15+ minutes with maximum of 5/10 back pain to demonstrate improved back extensor strength and improved sitting tolerance.  Baseline: 7/12: kyphotic posture with forward head rounded shoulders Goal status: INITIAL     PLAN: PT FREQUENCY: 2x/week  PT DURATION: 8 weeks  PLANNED INTERVENTIONS: Therapeutic exercises, Therapeutic activity, Neuromuscular re-education, Balance training, Gait training, Patient/Family education, Joint mobilization, Joint manipulation, Dry Needling, Electrical stimulation, Spinal manipulation, Spinal mobilization, Cryotherapy, Moist heat, Taping, Traction, Ultrasound, Ionotophoresis 4mg /ml Dexamethasone, and Manual therapy  PLAN FOR NEXT SESSION: TDN to middle traps/upper traps, subscap. Posture education and performance    Janna Arch, [place] 12/29/2021, 8:08 AM

## 2021-12-29 ENCOUNTER — Ambulatory Visit: Payer: No Typology Code available for payment source

## 2021-12-29 ENCOUNTER — Ambulatory Visit: Payer: No Typology Code available for payment source | Admitting: Family Medicine

## 2021-12-29 DIAGNOSIS — M5412 Radiculopathy, cervical region: Secondary | ICD-10-CM

## 2021-12-29 DIAGNOSIS — M542 Cervicalgia: Secondary | ICD-10-CM | POA: Diagnosis not present

## 2021-12-29 DIAGNOSIS — M546 Pain in thoracic spine: Secondary | ICD-10-CM

## 2022-01-04 NOTE — Therapy (Signed)
OUTPATIENT PHYSICAL THERAPY TREATMENT   Patient Name: Lisa [name] MRN: [id number] DOB:September 10, 1975, 46 y.o., female Today's Date: 01/05/2022   PT End of Session - 01/05/22 0713     Visit Number 6    Number of Visits 16    Date for PT Re-Evaluation 01/19/22    Authorization Type 6/10 eval 11/24/21    PT Start Time 0715    PT Stop Time 0759    PT Time Calculation (min) 44 min    Activity Tolerance Patient tolerated treatment well;Patient limited by pain    Behavior During Therapy [facility] for tasks assessed/performed                  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   PONV (postoperative nausea and vomiting)    Pre-syncope 07/14/2014   Shortness of breath    "wheezing"   Weakness of left arm 03/03/2020   Past Surgical History:  Procedure Laterality Date   CESAREAN SECTION     x 2   MICROLARYNGOSCOPY WITH DILATION N/A 03/13/2020   Procedure: MICROLARYNGOSCOPY WITH DILATION w/JET VENT MITOMYCIN C;  Surgeon: Christia Reading, MD;  Location: [facility] OR;  Service: ENT;  Laterality: N/A;   MICROLARYNGOSCOPY WITH LASER AND BALLOON DILATION N/A 07/20/2012   Procedure: MICROLARYNGOSCOPY WITH LASER AND BALLOON DILATION;  Surgeon: Christia Reading, MD;  Location: [facility] OR;  Service: ENT;  Laterality: N/A;  WITH JET VENTURI VENTILATION   TONGUE FLAP RELEASE     widom extractions     Patient Active Problem List   Diagnosis Date Noted   Acidosis 11/15/2021   SOB (shortness of breath) 11/08/2021   Trigger point of left shoulder region  10/06/2021   Cervical radiculitis 03/03/2020   Weakness of left arm 03/03/2020   Cervicalgia 02/28/2020   Spondylosis, cervical, 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   Loud snoring 08/01/2019   B12 deficiency 08/01/2019   Eczema of left external ear 08/01/2019   Hyperlipidemia associated with type 2 diabetes mellitus (HCC) 07/31/2019   Nonallopathic lesion of cervical region 04/29/2019   Nonallopathic lesion of thoracic region 04/29/2019   Nonallopathic lesion of rib cage 04/29/2019   Cervical radiculopathy at C7 03/26/2019   Abnormal breast exam 01/24/2019   Allergic rhinitis due to pollen 08/18/2018   Type 2 diabetes mellitus without complications (HCC) 05/02/2017   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   Encounter for general adult medical examination with abnormal findings 04/29/2016   Low back pain without sciatica 04/28/2015   Morbid obesity (HCC) 04/28/2015    PCP: Sherlene Shams MD  REFERRING PROVIDER: [name] DO  REFERRING DIAG: P32.95 (ICD-10-CM) - Cervical radiculitis M25.512 (ICD-10-CM) - Trigger point of left shoulder region   THERAPY DIAG:  Cervicalgia  Pain in thoracic spine  Radiculopathy, cervical region  Rationale for Evaluation and Treatment Rehabilitation  ONSET DATE: New onset May 2023.   SUBJECTIVE:  SUBJECTIVE STATEMENT: Patient reports she had a headache Monday. Has been having less rib pain since last session.   PERTINENT HISTORY:  Patient is a 46 year old female who presents with cervical radiculitis and left shoulder trigger points. PMH includes. DDD, cervical radiculopathy, dysrythmia, GERD, HA, HTN, subglottic tracheal stenosis, PONV, DM. Patient's pain began around three years ago, had a flare up two years ago that resulted in a lot of nerve pain in L  arm. Had an MRI which showed stenosis; went to pain management and saw Judson Roch at [facility], got better. Had a flare up about 8 weeks ago which now is down scapula and around rib. 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   PAIN:  Are you having pain? Yes: NPRS scale: 4/10 Pain location: radiating down arm and around scapula into anterior rib Pain description: aching Aggravating factors: using L arm, work Relieving factors: ibuprofin, ice, heat   Worst pain: 5/10:  PRECAUTIONS: None  WEIGHT BEARING RESTRICTIONS No  FALLS:  Has patient fallen in last 6 months? 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: Independent  PATIENT GOALS alleviate rib pain  OBJECTIVE:   CERVICAL ROM:   Active ROM A/PROM (deg) eval  Flexion 45  Extension 65  Right lateral flexion 50  Left lateral flexion 38  Right rotation 46  Left rotation 41   (Blank rows = not tested)  Thoracic spine: limited 15% in extension   UPPER EXTREMITY MMT:  MMT Right eval Left eval  Shoulder flexion 5 4  Shoulder extension 5 4  Shoulder abduction 5 4  Shoulder adduction 5 4  Shoulder internal rotation 5 4-  Shoulder external rotation 5 4-  Middle trapezius 5 3+  Lower trapezius 5 3+  Elbow flexion 5 4-  Elbow extension 5 4-   (Blank rows = not tested)    TODAY'S TREATMENT:   Manual: Grade II mobilizations CPA and UPA to cervical and thoracic region J mobilization 3x30 seconds Intercostal cross friction massage x 10 minutes sidelying     Trigger Point Dry Needling (TDN), unbilled Education performed with patient regarding potential benefit of TDN. Reviewed precautions and risks with patient. Reviewed special precautions/risks over lung fields which include pneumothorax. Reviewed signs and symptoms of pneumothorax and advised pt  to go to ER immediately if these symptoms develop advise them of dry needling treatment. Extensive time spent with pt to ensure full understanding of TDN risks. Pt provided verbal consent to treatment. TDN performed to  with 0.25 x 40 single needle placements with local twitch response (LTR). Pistoning technique utilized. Improved pain-free motion following intervention. Muscles targeted: L and R upper trap, L subscap, L thoracic paraspinals,x 20 minutes    PATIENT EDUCATION:  Education details: goals, POC, HEP  Person educated: Patient Education method: Explanation, Demonstration, Tactile cues, and Verbal cues Education comprehension: verbalized understanding, returned demonstration, verbal cues required, and tactile cues required   HOME EXERCISE PROGRAM: Access Code: [email or web address] URL: [email or web address] Date: 11/24/2021 Prepared by: Janna Arch  Exercises - Seated Scapular Retraction  - 1 x daily - 7 x weekly - 2 sets - 10 reps - 5 hold - Supine Lower Trunk Rotation  - 1 x daily - 7 x weekly - 2 sets - 10 reps - 5 hold - Standing 'L' Stretch at Counter  - 1 x  daily - 7 x weekly - 2 sets - 10 reps - 5 hold - Seated Thoracic Lumbar Extension with Pectoralis Stretch  - 1 x daily - 7 x weekly - 2 sets - 10 reps - 5 hold  ASSESSMENT:  CLINICAL IMPRESSION: Patient presents with improved pain levels this session allowing for increased deep tissue release. Her thoracic spine has decreased hypomobility but continues to be stiffer than desired. She continues to be highly motivated throughout session.  Patient will benefit from skilled physical therapy to reduce pain, improve posture, and improve quality of life.    OBJECTIVE IMPAIRMENTS decreased mobility, decreased ROM, decreased strength, hypomobility, impaired perceived functional ability, impaired flexibility, impaired UE functional use, improper body mechanics, postural dysfunction, and pain.   ACTIVITY LIMITATIONS carrying,  lifting, bending, dressing, reach over head, hygiene/grooming, and caring for others  PARTICIPATION LIMITATIONS: meal prep, cleaning, laundry, interpersonal relationship, driving, shopping, community activity, occupation, and yard work  PERSONAL FACTORS Age, Fitness, Past/current experiences, Profession, Sex, Time since onset of injury/illness/exacerbation, and 3+ comorbidities: DDD, cervical radiculopathy, dysrythmia, GERD, HA, HTN, subglottic tracheal stenosis, PONV, DM  are also affecting patient's functional outcome.   REHAB POTENTIAL: Good  CLINICAL DECISION MAKING: Evolving/moderate complexity  EVALUATION COMPLEXITY: Moderate   GOALS: Goals reviewed with patient? Yes  SHORT TERM GOALS: Target date: 12/28/2021   Patient will be independent in home exercise program to improve strength/mobility for better functional independence with ADLs. Baseline: 7/12; HEP given Goal status: INITIAL  2.  Patient will report a worst pain of 4/10 on VAS in  shoulder/neck/rib  to improve tolerance with ADLs and reduced symptoms with activities.  Baseline: 7/12: 5/10  Goal status: INITIAL  LONG TERM GOALS: Target date: 01/25/2022  Patient will increase FOTO score to equal to or greater than  70%   to demonstrate statistically significant improvement in mobility and quality of life.  Baseline: 7/12: 63% Goal status: INITIAL  2.  Patient will report a worst pain of 2/10 on VAS in neck, back, rib to improve tolerance with ADLs and reduced symptoms with activities.  Baseline: 7/12: 5/10  Goal status: INITIAL  3.  Patient will reduce Neck Disability Index score to <10% to demonstrate minimal disability with ADL's including improved sleeping tolerance, sitting tolerance, etc for better mobility at home and work. Baseline: 7/12: 34% Goal status: INITIAL  4.  Patient will be able to utilize LUE while working without increase in pain and fatigue for return to PLOF Baseline: 7/12: limited use of  LUE Goal status: INITIAL  5.  Patient will tolerate sitting unsupported demonstrating erect sitting posture with minimal thoracic kyphosis for 15+ minutes with maximum of 5/10 back pain to demonstrate improved back extensor strength and improved sitting tolerance.  Baseline: 7/12: kyphotic posture with forward head rounded shoulders Goal status: INITIAL     PLAN: PT FREQUENCY: 2x/week  PT DURATION: 8 weeks  PLANNED INTERVENTIONS: Therapeutic exercises, Therapeutic activity, Neuromuscular re-education, Balance training, Gait training, Patient/Family education, Joint mobilization, Joint manipulation, Dry Needling, Electrical stimulation, Spinal manipulation, Spinal mobilization, Cryotherapy, Moist heat, Taping, Traction, Ultrasound, Ionotophoresis 4mg /ml Dexamethasone, and Manual therapy  PLAN FOR NEXT SESSION: TDN to middle traps/upper traps, subscap. Posture education and performance    , Precious Bard 01/05/2022, 8:25 AM

## 2022-01-05 ENCOUNTER — Ambulatory Visit: Payer: No Typology Code available for payment source

## 2022-01-05 DIAGNOSIS — M546 Pain in thoracic spine: Secondary | ICD-10-CM

## 2022-01-05 DIAGNOSIS — M542 Cervicalgia: Secondary | ICD-10-CM

## 2022-01-05 DIAGNOSIS — M5412 Radiculopathy, cervical region: Secondary | ICD-10-CM

## 2022-01-06 ENCOUNTER — Ambulatory Visit: Payer: No Typology Code available for payment source | Admitting: Internal Medicine

## 2022-01-11 NOTE — Therapy (Signed)
OUTPATIENT PHYSICAL THERAPY TREATMENT   Patient Name: Lisa [name] MRN: [id number] DOB:[birth year], 46 y.o., female Today's Date: 01/12/2022   PT End of Session - 01/12/22 0721     Visit Number 7    Number of Visits 16    Date for PT Re-Evaluation 01/19/22    Authorization Type 7/10 eval 11/24/21    PT Start Time 0720    PT Stop Time 0759    PT Time Calculation (min) 39 min    Activity Tolerance Patient tolerated treatment well;Patient limited by pain    Behavior During Therapy [facility] for tasks assessed/performed                   Past Medical History:  Diagnosis Date   Bronchitis    hx of   Cervical radiculitis [id number]   Complication of anesthesia    COVID-19 10/2020   DDD (degenerative disc disease), cervical 02/28/2020   Dysrhythmia    "does not see cardiologist"   Family history of anesthesia complication    "morphine night terrors"   GERD (gastroesophageal reflux disease)    Headache(784.0)    "migraines"   Hypertension    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 and 2021   PONV (postoperative nausea and vomiting)    Pre-syncope 07/14/2014   Shortness of breath    "wheezing"   Weakness of left arm 03/03/2020   Past Surgical History:  Procedure Laterality Date   CESAREAN SECTION     x 2   MICROLARYNGOSCOPY WITH DILATION N/A 03/13/2020   Procedure: MICROLARYNGOSCOPY WITH DILATION w/JET VENT MITOMYCIN C;  Surgeon: Melida Quitter, MD;  Location: [place];  Service: ENT;  Laterality: N/A;   MICROLARYNGOSCOPY WITH LASER AND BALLOON DILATION N/A 07/20/2012   Procedure: MICROLARYNGOSCOPY WITH LASER AND BALLOON DILATION;  Surgeon: Melida Quitter, MD;  Location: [place];  Service: ENT;  Laterality: N/A;  WITH JET VENTURI VENTILATION   TONGUE FLAP RELEASE     widom extractions     Patient Active Problem List   Diagnosis Date Noted   Acidosis 11/15/2021   SOB (shortness of breath) 11/08/2021   Trigger point of left shoulder region  10/06/2021   Cervical radiculitis 03/03/2020   Weakness of left arm 03/03/2020   Cervicalgia 02/28/2020   Spondylosis, cervical, 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   Loud snoring 08/01/2019   B12 deficiency 08/01/2019   Eczema of left external ear 08/01/2019   Hyperlipidemia associated with type 2 diabetes mellitus ([place]) 07/31/2019   Nonallopathic lesion of cervical region 04/29/2019   Nonallopathic lesion of thoracic region 04/29/2019   Nonallopathic lesion of rib cage 04/29/2019   Cervical radiculopathy at C7 03/26/2019   Abnormal breast exam 01/24/2019   Allergic rhinitis due to pollen 08/18/2018   Type 2 diabetes mellitus without complications ([place]) [id number]   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   Encounter for general adult medical examination with abnormal findings 04/29/2016   Low back pain without sciatica 04/28/2015   Morbid obesity ([place]) 04/28/2015    PCP: Crecencio Mc MD  REFERRING PROVIDER: Lyndal Pulley DO  REFERRING DIAG: [id number] (ICD-10-CM) - Cervical radiculitis M25.512 (ICD-10-CM) - Trigger point of left shoulder region   THERAPY DIAG:  Cervicalgia  Pain in thoracic spine  Radiculopathy, cervical region  Rationale for Evaluation and Treatment Rehabilitation  ONSET DATE: New onset May 2023.   SUBJECTIVE:  SUBJECTIVE STATEMENT: Patient reports she did cleaning yesterday and had more pain. Having a slow start to the morning.   PERTINENT HISTORY:  Patient is a 46 year old female who presents with cervical radiculitis and left shoulder trigger points. PMH includes. DDD, cervical radiculopathy, dysrythmia, GERD, HA, HTN, subglottic tracheal stenosis, PONV, DM. Patient's pain began around three years ago, had a flare up two years ago that resulted in a lot of nerve pain in  L arm. Had an MRI which showed stenosis; went to pain management and saw Maralyn Sago at [facility], got better. Had a flare up about 8 weeks ago which now is down scapula and around rib. 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   PAIN:  Are you having pain? Yes: NPRS scale: 2/10 Pain location: radiating down arm and around scapula into anterior rib Pain description: aching Aggravating factors: using L arm, work Relieving factors: ibuprofin, ice, heat   Worst pain: 5/10:  PRECAUTIONS: None  WEIGHT BEARING RESTRICTIONS No  FALLS:  Has patient fallen in last 6 months? 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: Independent  PATIENT GOALS alleviate rib pain  OBJECTIVE:   CERVICAL ROM:   Active ROM A/PROM (deg) eval  Flexion 45  Extension 65  Right lateral flexion 50  Left lateral flexion 38  Right rotation 46  Left rotation 41   (Blank rows = not tested)  Thoracic spine: limited 15% in extension   UPPER EXTREMITY MMT:  MMT Right eval Left eval  Shoulder flexion 5 4  Shoulder extension 5 4  Shoulder abduction 5 4  Shoulder adduction 5 4  Shoulder internal rotation 5 4-  Shoulder external rotation 5 4-  Middle trapezius 5 3+  Lower trapezius 5 3+  Elbow flexion 5 4-  Elbow extension 5 4-   (Blank rows = not tested)    TODAY'S TREATMENT:   Manual: Grade II mobilizations CPA and UPA to cervical and thoracic region J mobilization 3x30 seconds Intercostal cross friction massage x 10 minutes sidelying     Trigger Point Dry Needling (TDN), unbilled Education performed with patient regarding potential benefit of TDN. Reviewed precautions and risks with patient. Reviewed special precautions/risks over lung fields which include pneumothorax. Reviewed signs and symptoms of pneumothorax and advised pt  to go to ER immediately if these symptoms develop advise them of dry needling treatment. Extensive time spent with pt to ensure full understanding of TDN risks. Pt provided verbal consent to treatment. TDN performed to  with 0.25 x 40 single needle placements with local twitch response (LTR). Pistoning technique utilized. Improved pain-free motion following intervention. Muscles targeted: L and R upper trap, L subscap, L thoracic paraspinals,x 20 minutes    PATIENT EDUCATION:  Education details: goals, POC, HEP  Person educated: Patient Education method: Explanation, Demonstration, Tactile cues, and Verbal cues Education comprehension: verbalized understanding, returned demonstration, verbal cues required, and tactile cues required   HOME EXERCISE PROGRAM: Access Code: [email or web address] URL: [email or web address] Date: 11/24/2021 Prepared by: Precious Bard  Exercises - Seated Scapular Retraction  - 1 x daily - 7 x weekly - 2 sets - 10 reps - 5 hold - Supine Lower Trunk Rotation  - 1 x daily - 7 x weekly - 2 sets - 10 reps - 5 hold - Standing 'L' Stretch at [employer]  - 1  x daily - 7 x weekly - 2 sets - 10 reps - 5 hold - Seated Thoracic Lumbar Extension with Pectoralis Stretch  - 1 x daily - 7 x weekly - 2 sets - 10 reps - 5 hold  ASSESSMENT:  CLINICAL IMPRESSION: Patient presents with decreased pain and improved body mechanics indicating carryover from previous sessions. Intercostal musculature is improving however continues to have some dysfunction resulting in pain. Multiple large trigger points reduced in supine and prone position in upper trap position. Thoracic paraspinals improving in length and do not require extensive treatment this session. Patient will continue to benefit from skilled physical therapy to reduce pain, improve posture, and improve quality of life.    OBJECTIVE IMPAIRMENTS decreased mobility, decreased ROM, decreased strength, hypomobility, impaired perceived  functional ability, impaired flexibility, impaired UE functional use, improper body mechanics, postural dysfunction, and pain.   ACTIVITY LIMITATIONS carrying, lifting, bending, dressing, reach over head, hygiene/grooming, and caring for others  PARTICIPATION LIMITATIONS: meal prep, cleaning, laundry, interpersonal relationship, driving, shopping, community activity, occupation, and yard work  PERSONAL FACTORS Age, Fitness, Past/current experiences, Profession, Sex, Time since onset of injury/illness/exacerbation, and 3+ comorbidities: DDD, cervical radiculopathy, dysrythmia, GERD, HA, HTN, subglottic tracheal stenosis, PONV, DM  are also affecting patient's functional outcome.   REHAB POTENTIAL: Good  CLINICAL DECISION MAKING: Evolving/moderate complexity  EVALUATION COMPLEXITY: Moderate   GOALS: Goals reviewed with patient? Yes  SHORT TERM GOALS: Target date: 12/28/2021   Patient will be independent in home exercise program to improve strength/mobility for better functional independence with ADLs. Baseline: 7/12; HEP given Goal status: INITIAL  2.  Patient will report a worst pain of 4/10 on VAS in  shoulder/neck/rib  to improve tolerance with ADLs and reduced symptoms with activities.  Baseline: 7/12: 5/10  Goal status: INITIAL  LONG TERM GOALS: Target date: 01/25/2022  Patient will increase FOTO score to equal to or greater than  70%   to demonstrate statistically significant improvement in mobility and quality of life.  Baseline: 7/12: 63% Goal status: INITIAL  2.  Patient will report a worst pain of 2/10 on VAS in neck, back, rib to improve tolerance with ADLs and reduced symptoms with activities.  Baseline: 7/12: 5/10  Goal status: INITIAL  3.  Patient will reduce Neck Disability Index score to <10% to demonstrate minimal disability with ADL's including improved sleeping tolerance, sitting tolerance, etc for better mobility at home and work. Baseline: 7/12: 34% Goal  status: INITIAL  4.  Patient will be able to utilize LUE while working without increase in pain and fatigue for return to PLOF Baseline: 7/12: limited use of LUE Goal status: INITIAL  5.  Patient will tolerate sitting unsupported demonstrating erect sitting posture with minimal thoracic kyphosis for 15+ minutes with maximum of 5/10 back pain to demonstrate improved back extensor strength and improved sitting tolerance.  Baseline: 7/12: kyphotic posture with forward head rounded shoulders Goal status: INITIAL     PLAN: PT FREQUENCY: 2x/week  PT DURATION: 8 weeks  PLANNED INTERVENTIONS: Therapeutic exercises, Therapeutic activity, Neuromuscular re-education, Balance training, Gait training, Patient/Family education, Joint mobilization, Joint manipulation, Dry Needling, Electrical stimulation, Spinal manipulation, Spinal mobilization, Cryotherapy, Moist heat, Taping, Traction, Ultrasound, Ionotophoresis 4mg /ml Dexamethasone, and Manual therapy  PLAN FOR NEXT SESSION: TDN to middle traps/upper traps, subscap. Posture education and performance    , Precious Bard 01/12/2022, 8:36 AM

## 2022-01-12 ENCOUNTER — Ambulatory Visit: Payer: No Typology Code available for payment source

## 2022-01-12 DIAGNOSIS — M542 Cervicalgia: Secondary | ICD-10-CM | POA: Diagnosis not present

## 2022-01-12 DIAGNOSIS — M546 Pain in thoracic spine: Secondary | ICD-10-CM

## 2022-01-12 DIAGNOSIS — M5412 Radiculopathy, cervical region: Secondary | ICD-10-CM

## 2022-01-14 ENCOUNTER — Encounter: Payer: Self-pay | Admitting: Primary Care

## 2022-01-14 ENCOUNTER — Ambulatory Visit (INDEPENDENT_AMBULATORY_CARE_PROVIDER_SITE_OTHER): Payer: No Typology Code available for payment source | Admitting: Primary Care

## 2022-01-14 VITALS — BP 122/78 | HR 96 | Temp 97.7°F | Ht 62.0 in | Wt 226.6 lb

## 2022-01-14 DIAGNOSIS — J42 Unspecified chronic bronchitis: Secondary | ICD-10-CM | POA: Diagnosis not present

## 2022-01-14 DIAGNOSIS — K219 Gastro-esophageal reflux disease without esophagitis: Secondary | ICD-10-CM

## 2022-01-14 NOTE — Progress Notes (Signed)
@Patient  ID: , female    DOB: 26-Jun-1975, 46 y.o.   MRN: [id number]  Chief Complaint  Patient presents with   Follow-up    C/o non prod cough.     Referring provider: [id number], MD  HPI: 46 year old female, never smoked.  Past medical history significant for type 2 diabetes, hyperlipidemia, obesity, allergic rhinitis, vitamin B12 deficiency.  Patient of Dr. 54, seen for initial consult on 11/10/2021 for shortness of breath.  01/14/2022 Patient was originally treated for bronchitis in June with Z-Pak and prednisone without relief.  She reported persistent shortness of breath with exertion and nonproductive cough.  During her last office visit with Dr. July on 11/10/2021 she was provided Medrol Dosepak and started on annuity inhaler.  She reports that within 2 days of starting Medrol Dosepak her cough resolved and she felt significantly better.  She has an occasional cough in the morning but is unbothered by this.  No shortness of breath.  She saw Dr. 11/12/2021 and reflux was felt to be contributing to cough.  She is currently taking omeprazole daily.  She continues to use Arnuity, Zyrtec and Flonase as needed.  Continues to have a dry cough.  Imaging: 11/08/21 CXR- Clear lungs. No active cardiopulmonary disease   Allergies  Allergen Reactions   Latex Hives    Rash and hives    Immunization History  Administered Date(s) Administered   Influenza Split 02/25/2015, 01/30/2017   Influenza-Unspecified 02/09/2016, 01/21/2018, 02/12/2019, 03/13/2020, 02/10/2021, 02/10/2022   Moderna Sars-Covid-2 Vaccination 05/06/2019, 05/27/2019, 05/12/2020   PNEUMOCOCCAL CONJUGATE-20 01/07/2021   Tdap 04/27/2016    Past Medical History:  Diagnosis Date   Bronchitis    hx of   Cervical radiculitis 03/03/2020   Complication of anesthesia    COVID-19 10/2020   DDD (degenerative disc disease), cervical 02/28/2020   Dysrhythmia    "does not see cardiologist"   Family  history of anesthesia complication    "morphine night terrors"   GERD (gastroesophageal reflux disease)    Headache(784.0)    "migraines"   Hypertension    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 and 2021   PONV (postoperative nausea and vomiting)    Pre-syncope 07/14/2014   Shortness of breath    "wheezing"   Weakness of left arm 03/03/2020    Tobacco History: Social History   Tobacco Use  Smoking Status Never  Smokeless Tobacco Never   Counseling given: Not Answered   Outpatient Medications Prior to Visit  Medication Sig Dispense Refill   acetaminophen (TYLENOL) 500 MG tablet Take 1,000 mg by mouth every 6 (six) hours as needed.     cetirizine (ZYRTEC) 10 MG tablet Take 10 mg by mouth daily.     cholecalciferol (VITAMIN D3) 25 MCG (1000 UNIT) tablet Take 1,000 Units by mouth daily.     cyclobenzaprine (FLEXERIL) 10 MG tablet Take 1 tablet (10 mg total) by mouth 3 (three) times daily as needed for muscle spasms. 90 tablet 1   etonogestrel (NEXPLANON) 68 MG IMPL implant 1 each by Subdermal route once.     Fluticasone Furoate (ARNUITY ELLIPTA) 100 MCG/ACT AEPB Inhale 1 puff into the lungs daily. 30 each 3   folic acid (FOLVITE) 1 MG tablet Take 1 tablet (1 mg total) by mouth daily. 90 tablet 1   Insulin Pen Needle (PEN NEEDLES) 32G X 4 MM MISC Use weekly with ozempic pen 100 each 0   Misc Natural Products (TART CHERRY ADVANCED  PO) Take 1 capsule by mouth daily.     Turmeric 400 MG CAPS Take 400 mg by mouth daily.      DENAVIR 1 % cream APPLY TOPICALLY EVERY TWO HOURS FOR 4 DAYS AS NEEDED FOR COLD SORES 5 g 0   methylPREDNISolone (MEDROL DOSEPAK) 4 MG TBPK tablet Take as directed in the package 21 tablet 0   Semaglutide-Weight Management (WEGOVY) 2.4 MG/0.75ML SOAJ Inject 2.4 mg into the skin once a week for 28 days. 3 mL 0   fluticasone (FLONASE) 50 MCG/ACT nasal spray PLACE 2 SPRAYS INTO BOTH NOSTRILS DAILY. 48 g 1   gabapentin (NEURONTIN) 100 MG  capsule TAKE 2 CAPSULES (200 MG) BY MOUTH AT BEDTIME. (Patient not taking: Reported on 11/15/2021) 180 capsule 3   No facility-administered medications prior to visit.    Review of Systems  Review of Systems  Constitutional: Negative.   HENT: Negative.    Respiratory:  Positive for cough.   Cardiovascular: Negative.     Physical Exam  BP 122/78 (BP Location: Left Arm, Cuff Size: Normal)   Pulse 96   Temp 97.7 F (36.5 C) (Temporal)   Ht 5\' 2"  (1.575 m)   Wt 226 lb 9.6 oz (102.8 kg)   SpO2 100%   BMI 41.45 kg/m  Physical Exam Constitutional:      Appearance: Normal appearance.  HENT:     Head: Normocephalic and atraumatic.  Cardiovascular:     Rate and Rhythm: Normal rate and regular rhythm.  Pulmonary:     Effort: Pulmonary effort is normal.     Breath sounds: Normal breath sounds. No wheezing.  Neurological:     General: No focal deficit present.     Mental Status: She is alert and oriented to person, place, and time. Mental status is at baseline.  Psychiatric:        Mood and Affect: Mood normal.        Behavior: Behavior normal.        Thought Content: Thought content normal.        Judgment: Judgment normal.      Lab Results:  CBC    Component Value Date/Time   WBC 14.4 (H) 02/24/2022 2250   RBC 5.04 02/24/2022 2250   HGB 14.2 02/24/2022 2250   HGB 14.0 04/03/2013 0925   HCT 43.7 02/24/2022 2250   HCT 41.2 04/03/2013 0925   PLT 258 02/24/2022 2250   PLT 209 04/03/2013 0925   MCV 86.7 02/24/2022 2250   MCV 84 04/03/2013 0925   MCH 28.2 02/24/2022 2250   MCHC 32.5 02/24/2022 2250   RDW 13.0 02/24/2022 2250   RDW 13.2 04/03/2013 0925   LYMPHSABS 1.8 02/24/2022 2250   MONOABS 0.7 02/24/2022 2250   EOSABS 0.1 02/24/2022 2250   BASOSABS 0.1 02/24/2022 2250    BMET    Component Value Date/Time   NA 136 02/24/2022 2250   K 3.7 02/24/2022 2250   CL 108 02/24/2022 2250   CO2 21 (L) 02/24/2022 2250   GLUCOSE 127 (H) 02/24/2022 2250   BUN 18  02/24/2022 2250   CREATININE 0.66 02/24/2022 2250   CALCIUM 9.2 02/24/2022 2250   GFRNONAA >60 02/24/2022 2250   GFRAA >90 07/20/2012 0912    BNP No results found for: "BNP"  ProBNP No results found for: "PROBNP"  Imaging: No results found.   Assessment & Plan:   Chronic bronchitis (HCC) - Treated for bronchitis in June with Z-Pak and prednisone without relief. During her last  office visit with Dr. Jayme Cloud on 11/10/2021 she was provided Medrol Dosepak.  - Cough is significantly better, she has a residual dry cough which is not bothersome to her. Reflux also felt to be contributing to cough.  - Continue Arnuity one puff daily in the morning. She can trial off inhaler for 1 week and if cough worsens recommend resuming daily use  GERD (gastroesophageal reflux disease) - Continue GERD medication    Glenford Bayley, NP 04/04/2022

## 2022-01-14 NOTE — Patient Instructions (Addendum)
Recommendations: - Continue Arnuity one puff daily in the morning (you can trial off inhaler for 1 week and if cough worsens recommend resuming daily use) - Continue omeprazole daily, zyrtec 10mg  daily and fonase as needed  - If reflux is flaring you can add pepcid (famotidine) 20mg  at bedtime  - You are up to date on pneumonia vaccine (you received Prevnar 20 and 2022), you will not need another one for 5 years  Follow-up: - 6 months with Dr. 

## 2022-01-17 ENCOUNTER — Other Ambulatory Visit: Payer: Self-pay | Admitting: Internal Medicine

## 2022-01-17 ENCOUNTER — Other Ambulatory Visit: Payer: Self-pay | Admitting: Family

## 2022-01-17 ENCOUNTER — Other Ambulatory Visit: Payer: Self-pay

## 2022-01-17 MED ORDER — FLUTICASONE PROPIONATE 50 MCG/ACT NA SUSP
2.0000 | Freq: Every day | NASAL | 1 refills | Status: DC
  Filled 2022-01-17: qty 48, 90d supply, fill #0

## 2022-01-18 ENCOUNTER — Other Ambulatory Visit: Payer: Self-pay | Admitting: Family

## 2022-01-18 ENCOUNTER — Other Ambulatory Visit: Payer: Self-pay

## 2022-01-20 ENCOUNTER — Other Ambulatory Visit: Payer: Self-pay | Admitting: Internal Medicine

## 2022-01-20 ENCOUNTER — Other Ambulatory Visit: Payer: Self-pay

## 2022-01-21 ENCOUNTER — Other Ambulatory Visit: Payer: Self-pay

## 2022-01-21 ENCOUNTER — Encounter: Payer: Self-pay | Admitting: Internal Medicine

## 2022-01-21 MED ORDER — WEGOVY 2.4 MG/0.75ML ~~LOC~~ SOAJ
2.4000 mg | SUBCUTANEOUS | 0 refills | Status: DC
  Filled 2022-01-21: qty 3, 28d supply, fill #0

## 2022-01-25 NOTE — Therapy (Signed)
OUTPATIENT PHYSICAL THERAPY TREATMENT/RECERT   Patient Name: Lisa [name] MRN: [id number] DOB:10-28-1975, 46 y.o., female Today's Date: 01/26/2022   PT End of Session - 01/26/22 0725     Visit Number 8    Number of Visits 24    Date for PT Re-Evaluation 03/23/22    Authorization Type 7/10 eval 11/24/21    PT Start Time 0726    PT Stop Time 0800    PT Time Calculation (min) 34 min    Activity Tolerance Patient tolerated treatment well;Patient limited by pain    Behavior During Therapy [facility] for tasks assessed/performed                    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   PONV (postoperative nausea and vomiting)    Pre-syncope 07/14/2014   Shortness of breath    "wheezing"   Weakness of left arm 03/03/2020   Past Surgical History:  Procedure Laterality Date   CESAREAN SECTION     x 2   MICROLARYNGOSCOPY WITH DILATION N/A 03/13/2020   Procedure: MICROLARYNGOSCOPY WITH DILATION w/JET VENT MITOMYCIN C;  Surgeon: Christia Reading, MD;  Location: [facility];  Service: ENT;  Laterality: N/A;   MICROLARYNGOSCOPY WITH LASER AND BALLOON DILATION N/A 07/20/2012   Procedure: MICROLARYNGOSCOPY WITH LASER AND BALLOON DILATION;  Surgeon: Christia Reading, MD;  Location: [facility] OR;  Service: ENT;  Laterality: N/A;  WITH JET VENTURI VENTILATION   TONGUE FLAP RELEASE     widom extractions     Patient Active Problem List   Diagnosis Date Noted   Acidosis 11/15/2021   SOB (shortness of breath) 11/08/2021   Trigger point of left shoulder  region 10/06/2021   Cervical radiculitis 03/03/2020   Weakness of left arm 03/03/2020   Cervicalgia 02/28/2020   Spondylosis, cervical, 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   Loud snoring 08/01/2019   B12 deficiency 08/01/2019   Eczema of left external ear 08/01/2019   Hyperlipidemia associated with type 2 diabetes mellitus (HCC) 07/31/2019   Nonallopathic lesion of cervical region 04/29/2019   Nonallopathic lesion of thoracic region 04/29/2019   Nonallopathic lesion of rib cage 04/29/2019   Cervical radiculopathy at C7 03/26/2019   Abnormal breast exam 01/24/2019   Allergic rhinitis due to pollen 08/18/2018   Type 2 diabetes mellitus without complications (HCC) 05/02/2017   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   Encounter for general adult medical examination with abnormal findings 04/29/2016   Low back pain without sciatica 04/28/2015   Morbid obesity (HCC) 04/28/2015    PCP: Sherlene Shams MD  REFERRING PROVIDER: [name] DO  REFERRING DIAG: T24.58 (ICD-10-CM) - Cervical radiculitis M25.512 (ICD-10-CM) - Trigger point of left shoulder region   THERAPY DIAG:  Cervicalgia - Plan: PT plan of care cert/re-cert  Pain in thoracic spine - Plan: PT plan of care cert/re-cert  Radiculopathy, cervical region - Plan: PT plan of care cert/re-cert  Rationale for Evaluation and Treatment Rehabilitation  ONSET DATE: New onset May 2023.  SUBJECTIVE:                                                                                                                                                                                                         SUBJECTIVE STATEMENT: Patient reports therapy has been helpful for her. Reports she is 60% back to baseline.  PERTINENT HISTORY:  Patient is a 46 year old female who presents with cervical radiculitis and left shoulder trigger points. PMH includes. DDD, cervical radiculopathy, dysrythmia, GERD, HA, HTN, subglottic tracheal stenosis, PONV, DM.  Patient's pain began around three years ago, had a flare up two years ago that resulted in a lot of nerve pain in L arm. Had an MRI which showed stenosis; went to pain management and saw Judson Roch at [employer] [facility], got better. Had a flare up about 8 weeks ago which now is down scapula and around rib. 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   PAIN:  Are you having pain? Yes: NPRS scale: 2/10 Pain location: radiating down arm and around scapula into anterior rib Pain description: aching Aggravating factors: using L arm, work Relieving factors: ibuprofin, ice, heat   Worst pain: 5/10:  PRECAUTIONS: None  WEIGHT BEARING RESTRICTIONS No  FALLS:  Has patient fallen in last 6 months? 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: Independent  PATIENT GOALS alleviate rib pain  OBJECTIVE:   CERVICAL ROM:   Active ROM A/PROM (deg) eval  Flexion 45  Extension 65  Right lateral flexion 50  Left lateral flexion 38  Right rotation 46  Left rotation 41   (Blank rows = not tested)  Thoracic spine: limited 15% in extension   UPPER EXTREMITY MMT:  MMT Right eval Left eval  Shoulder flexion 5 4  Shoulder extension 5 4  Shoulder abduction 5 4  Shoulder adduction 5 4  Shoulder internal rotation 5 4-  Shoulder external rotation 5 4-  Middle trapezius 5 3+  Lower trapezius 5 3+  Elbow flexion 5 4-  Elbow extension 5 4-   (Blank rows = not tested)    TODAY'S TREATMENT:  Goals: See below for results   Manual: Grade II mobilizations CPA and UPA to cervical and thoracic region J mobilization 3x30 seconds Intercostal cross friction massage x 10 minutes sidelying    Trigger Point Dry Needling (TDN), unbilled Education performed with patient regarding potential benefit of TDN. Reviewed precautions  and risks with  patient. Reviewed special precautions/risks over lung fields which include pneumothorax. Reviewed signs and symptoms of pneumothorax and advised pt to go to ER immediately if these symptoms develop advise them of dry needling treatment. Extensive time spent with pt to ensure full understanding of TDN risks. Pt provided verbal consent to treatment. TDN performed to  with 0.25 x 40 single needle placements with local twitch response (LTR). Pistoning technique utilized. Improved pain-free motion following intervention. Muscles targeted: L and R upper trap, L subscap, L thoracic paraspinals,x 16 minutes    PATIENT EDUCATION:  Education details: goals, POC, HEP  Person educated: Patient Education method: Explanation, Demonstration, Tactile cues, and Verbal cues Education comprehension: verbalized understanding, returned demonstration, verbal cues required, and tactile cues required   HOME EXERCISE PROGRAM: Access Code: [email or web address] URL: [email or web address] Date: 11/24/2021 Prepared by: Precious Bard  Exercises - Seated Scapular Retraction  - 1 x daily - 7 x weekly - 2 sets - 10 reps - 5 hold - Supine Lower Trunk Rotation  - 1 x daily - 7 x weekly - 2 sets - 10 reps - 5 hold - Standing 'L' Stretch at Counter  - 1 x daily - 7 x weekly - 2 sets - 10 reps - 5 hold - Seated Thoracic Lumbar Extension with Pectoralis Stretch  - 1 x daily - 7 x weekly - 2 sets - 10 reps - 5 hold  ASSESSMENT:  CLINICAL IMPRESSION: Patient is making progress towards functional goals. Her pain levels are improving in regards to her ribs but her back/arm pain does continue to limit her at work. Patient session limited by late arrival time. Patient has improved postural correction and able to have intrinsic cues at this time rather than solely reliant on external cues.  She no longer has to lay on her heat pad every night indicating carryover between sessions. Patient will continue to benefit from skilled physical  therapy to reduce pain, improve posture, and improve quality of life.    OBJECTIVE IMPAIRMENTS decreased mobility, decreased ROM, decreased strength, hypomobility, impaired perceived functional ability, impaired flexibility, impaired UE functional use, improper body mechanics, postural dysfunction, and pain.   ACTIVITY LIMITATIONS carrying, lifting, bending, dressing, reach over head, hygiene/grooming, and caring for others  PARTICIPATION LIMITATIONS: meal prep, cleaning, laundry, interpersonal relationship, driving, shopping, community activity, occupation, and yard work  PERSONAL FACTORS Age, Fitness, Past/current experiences, Profession, Sex, Time since onset of injury/illness/exacerbation, and 3+ comorbidities: DDD, cervical radiculopathy, dysrythmia, GERD, HA, HTN, subglottic tracheal stenosis, PONV, DM  are also affecting patient's functional outcome.   REHAB POTENTIAL: Good  CLINICAL DECISION MAKING: Evolving/moderate complexity  EVALUATION COMPLEXITY: Moderate   GOALS: Goals reviewed with patient? Yes  SHORT TERM GOALS: Target date: 12/28/2021   Patient will be independent in home exercise program to improve strength/mobility for better functional independence with ADLs. Baseline: 7/12; HEP given Goal status: INITIAL  2.  Patient will report a worst pain of 4/10 on VAS in  shoulder/neck/rib  to improve tolerance with ADLs and reduced symptoms with activities.  Baseline: 7/12: 5/10 9/13: 5/10 rib pain 2/10  Goal status: IN PROGRESS  LONG TERM GOALS: Target date: 03/23/2022    Patient will increase FOTO score to equal to or greater than  70%   to demonstrate statistically significant improvement in mobility and quality of life.  Baseline: 7/12: 63% 9/13: 68% Goal status: IN PROGRESS  2.  Patient will report a worst pain of 2/10 on VAS in neck, back, rib  to improve tolerance with ADLs and reduced symptoms with activities.  Baseline: 7/12: 5/10 9/13: 5/10 ; rib pain 2/10   Goal status: IN PROGRESS  3.  Patient will reduce Neck Disability Index score to <10% to demonstrate minimal disability with ADL's including improved sleeping tolerance, sitting tolerance, etc for better mobility at home and work. Baseline: 7/12: 34% 9/13: 22%  Goal status: IN PROGRESS  4.  Patient will be able to utilize LUE while working without increase in pain and fatigue for return to PLOF Baseline: 7/12: limited use of LUE 9/13: protective of using LUE Goal status: IN PROGRESS  5.  Patient will tolerate sitting unsupported demonstrating erect sitting posture with minimal thoracic kyphosis for 15+ minutes with maximum of 5/10 back pain to demonstrate improved back extensor strength and improved sitting tolerance.  Baseline: 7/12: kyphotic posture with forward head rounded shoulders 9/13: improved posture but rib pain continues to be present Goal status: IN PROGRESS     PLAN: PT FREQUENCY: 2x/week  PT DURATION: 8 weeks  PLANNED INTERVENTIONS: Therapeutic exercises, Therapeutic activity, Neuromuscular re-education, Balance training, Gait training, Patient/Family education, Joint mobilization, Joint manipulation, Dry Needling, Electrical stimulation, Spinal manipulation, Spinal mobilization, Cryotherapy, Moist heat, Taping, Traction, Ultrasound, Ionotophoresis 4mg /ml Dexamethasone, and Manual therapy  PLAN FOR NEXT SESSION: TDN to middle traps/upper traps, subscap. Posture education and performance    Janna Arch, [place] 01/26/2022, 8:00 AM

## 2022-01-26 ENCOUNTER — Ambulatory Visit: Payer: No Typology Code available for payment source | Attending: Emergency Medicine

## 2022-01-26 DIAGNOSIS — R519 Headache, unspecified: Secondary | ICD-10-CM | POA: Diagnosis present

## 2022-01-26 DIAGNOSIS — M546 Pain in thoracic spine: Secondary | ICD-10-CM | POA: Insufficient documentation

## 2022-01-26 DIAGNOSIS — M5412 Radiculopathy, cervical region: Secondary | ICD-10-CM | POA: Insufficient documentation

## 2022-01-26 DIAGNOSIS — M542 Cervicalgia: Secondary | ICD-10-CM | POA: Diagnosis present

## 2022-01-31 ENCOUNTER — Ambulatory Visit: Payer: No Typology Code available for payment source

## 2022-01-31 ENCOUNTER — Other Ambulatory Visit: Payer: Self-pay

## 2022-01-31 DIAGNOSIS — M546 Pain in thoracic spine: Secondary | ICD-10-CM

## 2022-01-31 DIAGNOSIS — M542 Cervicalgia: Secondary | ICD-10-CM

## 2022-01-31 DIAGNOSIS — M5412 Radiculopathy, cervical region: Secondary | ICD-10-CM

## 2022-01-31 DIAGNOSIS — R519 Headache, unspecified: Secondary | ICD-10-CM

## 2022-01-31 NOTE — Therapy (Addendum)
OUTPATIENT PHYSICAL THERAPY TREATMENT/RECERT   Patient Name: Lisa [name] MRN: [id number] DOB:05-Apr-1976, 46 y.o., female Today's Date: 01/31/2022   PT End of Session - 01/31/22 0723     Visit Number 9    Number of Visits 24    Date for PT Re-Evaluation 03/23/22                    Past Medical History:  Diagnosis Date   Bronchitis    hx of   Cervical radiculitis [id number]   Complication of anesthesia    COVID-19 10/2020   DDD (degenerative disc disease), cervical 02/28/2020   Dysrhythmia    "does not see cardiologist"   Family history of anesthesia complication    "morphine night terrors"   GERD (gastroesophageal reflux disease)    Headache(784.0)    "migraines"   Hypertension    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 and 2021   PONV (postoperative nausea and vomiting)    Pre-syncope 07/14/2014   Shortness of breath    "wheezing"   Weakness of left arm 03/03/2020   Past Surgical History:  Procedure Laterality Date   CESAREAN SECTION     x 2   MICROLARYNGOSCOPY WITH DILATION N/A 03/13/2020   Procedure: MICROLARYNGOSCOPY WITH DILATION w/JET VENT MITOMYCIN C;  Surgeon: Melida Quitter, MD;  Location: [place];  Service: ENT;  Laterality: N/A;   MICROLARYNGOSCOPY WITH LASER AND BALLOON DILATION N/A 07/20/2012   Procedure: MICROLARYNGOSCOPY WITH LASER AND BALLOON DILATION;  Surgeon: Melida Quitter, MD;  Location: [place];  Service: ENT;  Laterality: N/A;  WITH JET [place]     widom extractions     Patient Active Problem List   Diagnosis Date Noted   Acidosis 11/15/2021   SOB (shortness of breath) 11/08/2021   Trigger point of left shoulder region 10/06/2021   Cervical radiculitis 03/03/2020   Weakness of left arm 03/03/2020   Cervicalgia 02/28/2020   Spondylosis, cervical, 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   Loud snoring 08/01/2019   B12 deficiency 08/01/2019   Eczema of left external ear  08/01/2019   Hyperlipidemia associated with type 2 diabetes mellitus ([place]) 07/31/2019   Nonallopathic lesion of cervical region 04/29/2019   Nonallopathic lesion of thoracic region 04/29/2019   Nonallopathic lesion of rib cage 04/29/2019   Cervical radiculopathy at C7 03/26/2019   Abnormal breast exam 01/24/2019   Allergic rhinitis due to pollen 08/18/2018   Type 2 diabetes mellitus without complications ([place]) [id number]   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   Encounter for general adult medical examination with abnormal findings 04/29/2016   Low back pain without sciatica 04/28/2015   Morbid obesity ([place]) 04/28/2015    PCP: Crecencio Mc MD  REFERRING PROVIDER: Lyndal Pulley DO  REFERRING DIAG: [id number] (ICD-10-CM) - Cervical radiculitis M25.512 (ICD-10-CM) - Trigger point of left shoulder region   THERAPY DIAG:  Cervicalgia  Pain in thoracic spine  Radiculopathy, cervical region  Nonintractable headache, unspecified chronicity pattern, unspecified headache type  Rationale for Evaluation and Treatment Rehabilitation  ONSET DATE: New onset May 2023.   SUBJECTIVE:  SUBJECTIVE STATEMENT: Pt fell, missed a step, rolled her ankle. It's feeling somewhat better. Did not see a MD, does not plan on seeing a doctor at this point. HEP is fine. She has no neck/back pain today.  PERTINENT HISTORY:  Patient is a 46 year old female who presents with cervical radiculitis and left shoulder trigger points. PMH includes. DDD, cervical radiculopathy, dysrythmia, GERD, HA, HTN, subglottic tracheal stenosis, PONV, DM. Patient's pain began around three years ago, had a flare up two years ago that resulted in a lot of nerve pain in L arm. Had an MRI which showed stenosis; went to pain management and saw  Judson Roch at [employer], got better. Had a flare up about 8 weeks ago which now is down scapula and around rib. 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   PAIN:  Are you having pain? 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  PLOF: Independent  PATIENT GOALS alleviate rib pain    TODAY'S TREATMENT:  UBE x4 minutes, level 4  HEP progression Blue TB row c scap retraction 2x15 Thoracic towel roll stretch x 2 minutes movign to wand flexion x15 Book openings x15 bilat Seated redTB ER bilat 2x15     PATIENT EDUCATION:  Education details: goals, POC, HEP  Person educated: Patient Education method: Explanation, Demonstration, Tactile cues, and Verbal cues Education comprehension: verbalized understanding, returned demonstration, verbal cues required, and tactile cues required   HOME EXERCISE PROGRAM: Access Code: [email or web address] URL: [email or web address] Date: 01/31/2022 Prepared by: Rebbeca Paul  Exercises - Standing Row with Resistance  - 1 x daily - 7 x weekly - 2 sets - 15 reps - Shoulder External Rotation and Scapular Retraction with Resistance  - 1 x daily - 7 x weekly - 2 sets - 10 reps - Sidelying Thoracic Rotation Book Openings- Both sides  - 1 x daily - 7 x weekly - 1 sets - 15 reps - Supine Thoracic Mobilization Foam Roll Horizontal  - 1 x daily - 7 x weekly - 1 sets - 1 reps - 30min hold - Supine Shoulder Flexion with Dowel to 90  - 1 x daily - 7 x weekly - 1 sets - 20 reps  ASSESSMENT:  CLINICAL IMPRESSION: Pt CC is improving. HEP is advanced this date. No pain during nor at end of session. Patient will continue to benefit from skilled physical therapy to reduce pain, improve posture,  and improve quality of life.    OBJECTIVE IMPAIRMENTS decreased mobility, decreased ROM, decreased strength, hypomobility, impaired perceived functional ability, impaired flexibility, impaired UE functional use, improper body mechanics, postural dysfunction, and pain.   ACTIVITY LIMITATIONS carrying, lifting, bending, dressing, reach over head, hygiene/grooming, and caring for others  PARTICIPATION LIMITATIONS: meal prep, cleaning, laundry, interpersonal relationship, driving, shopping, community activity, occupation, and yard work  PERSONAL FACTORS Age, Fitness, Past/current experiences, Profession, Sex, Time since onset of injury/illness/exacerbation, and 3+ comorbidities: DDD, cervical radiculopathy, dysrythmia, GERD, HA, HTN, subglottic tracheal stenosis, PONV, DM  are also affecting patient's functional outcome.   REHAB POTENTIAL: Good  CLINICAL DECISION MAKING: Evolving/moderate complexity  EVALUATION COMPLEXITY: Moderate   GOALS: Goals reviewed with patient? Yes  SHORT TERM GOALS: Target date: 12/28/2021   Patient will be independent in home exercise program  to improve strength/mobility for better functional independence with ADLs. Baseline: 7/12; HEP given Goal status: INITIAL  2.  Patient will report a worst pain of 4/10 on VAS in  shoulder/neck/rib  to improve tolerance with ADLs and reduced symptoms with activities.  Baseline: 7/12: 5/10 9/13: 5/10 rib pain 2/10  Goal status: IN PROGRESS  LONG TERM GOALS: Target date: 03/23/2022    Patient will increase FOTO score to equal to or greater than  70%   to demonstrate statistically significant improvement in mobility and quality of life.  Baseline: 7/12: 63% 9/13: 68% Goal status: IN PROGRESS  2.  Patient will report a worst pain of 2/10 on VAS in neck, back, rib to improve tolerance with ADLs and reduced symptoms with activities.  Baseline: 7/12: 5/10 9/13: 5/10 ; rib pain 2/10  Goal status: IN PROGRESS  3.  Patient  will reduce Neck Disability Index score to <10% to demonstrate minimal disability with ADL's including improved sleeping tolerance, sitting tolerance, etc for better mobility at home and work. Baseline: 7/12: 34% 9/13: 22%  Goal status: IN PROGRESS  4.  Patient will be able to utilize LUE while working without increase in pain and fatigue for return to PLOF Baseline: 7/12: limited use of LUE 9/13: protective of using LUE Goal status: IN PROGRESS  5.  Patient will tolerate sitting unsupported demonstrating erect sitting posture with minimal thoracic kyphosis for 15+ minutes with maximum of 5/10 back pain to demonstrate improved back extensor strength and improved sitting tolerance.  Baseline: 7/12: kyphotic posture with forward head rounded shoulders 9/13: improved posture but rib pain continues to be present Goal status: IN PROGRESS     PLAN: PT FREQUENCY: 2x/week  PT DURATION: 8 weeks  PLANNED INTERVENTIONS: Therapeutic exercises, Therapeutic activity, Neuromuscular re-education, Balance training, Gait training, Patient/Family education, Joint mobilization, Joint manipulation, Dry Needling, Electrical stimulation, Spinal manipulation, Spinal mobilization, Cryotherapy, Moist heat, Taping, Traction, Ultrasound, Ionotophoresis 4mg /ml Dexamethasone, and Manual therapy  PLAN FOR NEXT SESSION: TDN to middle traps/upper traps, subscap. Posture education and performance    Lanyia Jewel C, PT 01/31/2022, 7:56 AM  7:56 AM, 01/31/22 Etta Grandchild, PT, DPT Physical Therapist - [place] [phone]

## 2022-02-02 ENCOUNTER — Ambulatory Visit
Admission: RE | Admit: 2022-02-02 | Discharge: 2022-02-02 | Disposition: A | Discharge status: Home / Self Care | Payer: No Typology Code available for payment source | Source: Ambulatory Visit | Attending: Internal Medicine | Admitting: Internal Medicine

## 2022-02-02 DIAGNOSIS — Z1231 Encounter for screening mammogram for malignant neoplasm of breast: Secondary | ICD-10-CM | POA: Insufficient documentation

## 2022-02-08 NOTE — Therapy (Signed)
OUTPATIENT PHYSICAL THERAPY TREATMENT/Physical Therapy Progress Note   Dates of reporting period  11/24/21   to   02/08/22    Patient Name: Lisa [name] MRN: [id number] DOB:23-Oct-1975, 46 y.o., female Today's Date: 02/09/2022   PT End of Session - 02/09/22 0724     Visit Number 10    Number of Visits 24    Date for PT Re-Evaluation 03/23/22    Authorization Type 10/10 eval 11/24/21; next session 1/10 PN 9/27    PT Start Time 0724    PT Stop Time 0800    PT Time Calculation (min) 36 min    Activity Tolerance Patient tolerated treatment well;Patient limited by pain    Behavior During Therapy [facility] for tasks assessed/performed                     Past Medical History:  Diagnosis Date   Bronchitis    hx of   Cervical radiculitis [id number]   Complication of anesthesia    COVID-19 10/2020   DDD (degenerative disc disease), cervical 02/28/2020   Dysrhythmia    "does not see cardiologist"   Family history of anesthesia complication    "morphine night terrors"   GERD (gastroesophageal reflux disease)    Headache(784.0)    "migraines"   Hypertension    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 and 2021   PONV (postoperative nausea and vomiting)    Pre-syncope 07/14/2014   Shortness of breath    "wheezing"   Weakness of left arm 03/03/2020   Past Surgical History:  Procedure Laterality Date   CESAREAN SECTION     x 2   MICROLARYNGOSCOPY WITH DILATION N/A 03/13/2020   Procedure: MICROLARYNGOSCOPY WITH DILATION w/JET VENT MITOMYCIN C;  Surgeon: Melida Quitter, MD;  Location: [place];  Service: ENT;  Laterality: N/A;   MICROLARYNGOSCOPY WITH LASER AND BALLOON DILATION N/A 07/20/2012   Procedure: MICROLARYNGOSCOPY WITH LASER AND BALLOON DILATION;  Surgeon: Melida Quitter, MD;  Location: [place];  Service: ENT;  Laterality: N/A;  WITH JET VENTURI VENTILATION   TONGUE FLAP RELEASE     widom extractions     Patient Active Problem List    Diagnosis Date Noted   Acidosis 11/15/2021   SOB (shortness of breath) 11/08/2021   Trigger point of left shoulder region 10/06/2021   Cervical radiculitis 03/03/2020   Weakness of left arm 03/03/2020   Cervicalgia 02/28/2020   Spondylosis, cervical, 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   Loud snoring 08/01/2019   B12 deficiency 08/01/2019   Eczema of left external ear 08/01/2019   Hyperlipidemia associated with type 2 diabetes mellitus ([place]) 07/31/2019   Nonallopathic lesion of cervical region 04/29/2019   Nonallopathic lesion of thoracic region 04/29/2019   Nonallopathic lesion of rib cage 04/29/2019   Cervical radiculopathy at C7 03/26/2019   Abnormal breast exam 01/24/2019   Allergic rhinitis due to pollen 08/18/2018   Type 2 diabetes mellitus without complications ([place]) [id number]   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   Encounter for general adult medical examination with abnormal findings 04/29/2016   Low back pain without sciatica 04/28/2015   Morbid obesity ([place]) 04/28/2015    PCP: Crecencio Mc MD  REFERRING PROVIDER: Lyndal Pulley DO  REFERRING DIAG: [id number] (ICD-10-CM) - Cervical radiculitis M25.512 (ICD-10-CM) - Trigger point of left shoulder region   THERAPY DIAG:  Cervicalgia  Pain in thoracic spine  Radiculopathy, cervical region  Rationale for Evaluation and Treatment Rehabilitation  ONSET DATE: New onset  May 2023.   SUBJECTIVE:                                                                                                                                                                                                         SUBJECTIVE STATEMENT: Patient reports she went to the doctor about her ankle. Her rib pain is resolving   PERTINENT HISTORY:  Patient is a 46 year old female who presents with cervical radiculitis and left shoulder trigger points. PMH includes. DDD, cervical radiculopathy, dysrythmia, GERD, HA, HTN, subglottic tracheal stenosis, PONV, DM.  Patient's pain began around three years ago, had a flare up two years ago that resulted in a lot of nerve pain in L arm. Had an MRI which showed stenosis; went to pain management and saw Maralyn Sago at [facility], got better. Had a flare up about 8 weeks ago which now is down scapula and around rib. 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   PAIN:  Are you having pain? 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 Reviewed precautions and risks with patient. Reviewed special precautions/risks over lung fields which include pneumothorax. Reviewed signs and symptoms of pneumothorax and advised pt to go to ER immediately if these symptoms develop advise them of dry needling treatment. Extensive time spent with pt to ensure full understanding of TDN risks. Pt provided verbal consent to treatment. TDN performed to  with 0.25 x 40 single needle placements with local twitch response (LTR). Pistoning technique utilized. Improved pain-free motion following intervention. Muscles targeted: L and R upper trap, L subscap,,x 20 minutes    PATIENT EDUCATION:  Education details: goals, POC, HEP  Person educated: Patient Education method: Explanation, Demonstration, Tactile cues, and Verbal cues Education comprehension: verbalized understanding, returned  demonstration, verbal cues required, and tactile cues required   HOME EXERCISE PROGRAM: Access Code: [email or web address] URL: [email or web address] Date: 01/31/2022 Prepared by: Alvera Novel  Exercises -  Standing Row with Resistance  - 1 x daily - 7 x weekly - 2 sets - 15 reps - Shoulder External Rotation and Scapular Retraction with Resistance  - 1 x daily - 7 x weekly - 2 sets - 10 reps - Sidelying Thoracic Rotation Book Openings- Both sides  - 1 x daily - 7 x weekly - 1 sets - 15 reps - Supine Thoracic Mobilization Foam Roll Horizontal  - 1 x daily - 7 x weekly - 1 sets - 1 reps - 54min hold - Supine Shoulder Flexion with Dowel to 90  - 1 x daily - 7 x weekly - 1 sets - 20 reps  ASSESSMENT:  CLINICAL IMPRESSION: Patient presents with excellent motivation to therapy session. Session limited by late arrival. Patient tolerates progressive postural interventions and has decreased trigger points indicating carryover between sessions.  Patient's goals performed 01/26/22, please refer to this note for further details. Patient's condition has the potential to improve in response to therapy. Maximum improvement is yet to be obtained. The anticipated improvement is attainable and reasonable in a generally predictable time.  Current POC remains appropriate at this time. Patient will continue to benefit from skilled physical therapy to decrease pain, improve posture and improve quality of life.    OBJECTIVE IMPAIRMENTS decreased mobility, decreased ROM, decreased strength, hypomobility, impaired perceived functional ability, impaired flexibility, impaired UE functional use, improper body mechanics, postural dysfunction, and pain.   ACTIVITY LIMITATIONS carrying, lifting, bending, dressing, reach over head, hygiene/grooming, and caring for others  PARTICIPATION LIMITATIONS: meal prep, cleaning, laundry, interpersonal relationship, driving, shopping, community activity, occupation, and yard  work  PERSONAL FACTORS Age, Fitness, Past/current experiences, Profession, Sex, Time since onset of injury/illness/exacerbation, and 3+ comorbidities: DDD, cervical radiculopathy, dysrythmia, GERD, HA, HTN, subglottic tracheal stenosis, PONV, DM  are also affecting patient's functional outcome.   REHAB POTENTIAL: Good  CLINICAL DECISION MAKING: Evolving/moderate complexity  EVALUATION COMPLEXITY: Moderate   GOALS: Goals reviewed with patient? Yes  SHORT TERM GOALS: Target date: 12/28/2021   Patient will be independent in home exercise program to improve strength/mobility for better functional independence with ADLs. Baseline: 7/12; HEP given Goal status: INITIAL  2.  Patient will report a worst pain of 4/10 on VAS in  shoulder/neck/rib  to improve tolerance with ADLs and reduced symptoms with activities.  Baseline: 7/12: 5/10 9/13: 5/10 rib pain 2/10  Goal status: IN PROGRESS  LONG TERM GOALS: Target date: 03/23/2022    Patient will increase FOTO score to equal to or greater than  70%   to demonstrate statistically significant improvement in mobility and quality of life.  Baseline: 7/12: 63% 9/13: 68% Goal status: IN PROGRESS  2.  Patient will report a worst pain of 2/10 on VAS in neck, back, rib to improve tolerance with ADLs and reduced symptoms with activities.  Baseline: 7/12: 5/10 9/13: 5/10 ; rib pain 2/10  Goal status: IN PROGRESS  3.  Patient will reduce Neck Disability Index score to <10% to demonstrate minimal disability with ADL's including improved sleeping tolerance, sitting tolerance, etc for better mobility at home and work. Baseline: 7/12: 34% 9/13: 22%  Goal status: IN PROGRESS  4.  Patient will be able to utilize LUE while working without increase in pain and fatigue for return to PLOF Baseline: 7/12: limited use of LUE 9/13: protective of using LUE Goal status: IN PROGRESS  5.  Patient will tolerate sitting unsupported demonstrating erect sitting posture  with minimal thoracic kyphosis for 15+ minutes with  maximum of 5/10 back pain to demonstrate improved back extensor strength and improved sitting tolerance.  Baseline: 7/12: kyphotic posture with forward head rounded shoulders 9/13: improved posture but rib pain continues to be present Goal status: IN PROGRESS     PLAN: PT FREQUENCY: 2x/week  PT DURATION: 8 weeks  PLANNED INTERVENTIONS: Therapeutic exercises, Therapeutic activity, Neuromuscular re-education, Balance training, Gait training, Patient/Family education, Joint mobilization, Joint manipulation, Dry Needling, Electrical stimulation, Spinal manipulation, Spinal mobilization, Cryotherapy, Moist heat, Taping, Traction, Ultrasound, Ionotophoresis 4mg /ml Dexamethasone, and Manual therapy  PLAN FOR NEXT SESSION: TDN to middle traps/upper traps, subscap. Posture education and performance    Janna Arch, [place] 02/09/2022, 8:00 AM  8:00 AM, 02/09/22 Etta Grandchild, PT, DPT Physical Therapist - [place] [phone]

## 2022-02-09 ENCOUNTER — Ambulatory Visit: Payer: No Typology Code available for payment source

## 2022-02-09 DIAGNOSIS — M542 Cervicalgia: Secondary | ICD-10-CM

## 2022-02-09 DIAGNOSIS — M5412 Radiculopathy, cervical region: Secondary | ICD-10-CM

## 2022-02-09 DIAGNOSIS — M546 Pain in thoracic spine: Secondary | ICD-10-CM

## 2022-02-10 ENCOUNTER — Other Ambulatory Visit: Payer: Self-pay

## 2022-02-10 ENCOUNTER — Other Ambulatory Visit: Payer: Self-pay | Admitting: Internal Medicine

## 2022-02-11 ENCOUNTER — Other Ambulatory Visit: Payer: Self-pay

## 2022-02-11 MED FILL — Semaglutide (Weight Mngmt) Soln Auto-Injector 2.4 MG/0.75ML: SUBCUTANEOUS | 28 days supply | Qty: 3 | Fill #0 | Status: AC

## 2022-02-14 ENCOUNTER — Other Ambulatory Visit: Payer: Self-pay

## 2022-02-18 ENCOUNTER — Other Ambulatory Visit: Payer: Self-pay

## 2022-02-18 MED ORDER — METHYLPREDNISOLONE 4 MG PO TBPK
ORAL_TABLET | ORAL | 0 refills | Status: DC
  Filled 2022-02-18: qty 21, 6d supply, fill #0

## 2022-02-21 ENCOUNTER — Telehealth: Payer: Self-pay

## 2022-02-21 ENCOUNTER — Ambulatory Visit: Payer: No Typology Code available for payment source | Attending: Emergency Medicine

## 2022-02-21 NOTE — Telephone Encounter (Signed)
Pt did not show for scheduled appointment. Author called left a VM with details no next visit.   7:57 AM, 02/21/22 Etta Grandchild, PT, DPT Physical Therapist - [place] [phone]

## 2022-02-23 ENCOUNTER — Encounter: Payer: Self-pay | Admitting: Internal Medicine

## 2022-02-24 ENCOUNTER — Encounter: Payer: Self-pay | Admitting: Internal Medicine

## 2022-02-24 ENCOUNTER — Other Ambulatory Visit: Payer: Self-pay

## 2022-02-24 ENCOUNTER — Ambulatory Visit (INDEPENDENT_AMBULATORY_CARE_PROVIDER_SITE_OTHER): Payer: No Typology Code available for payment source | Admitting: Internal Medicine

## 2022-02-24 VITALS — BP 108/64 | HR 90 | Temp 97.7°F | Ht 62.0 in | Wt 221.6 lb

## 2022-02-24 DIAGNOSIS — K8020 Calculus of gallbladder without cholecystitis without obstruction: Secondary | ICD-10-CM | POA: Insufficient documentation

## 2022-02-24 DIAGNOSIS — Z7984 Long term (current) use of oral hypoglycemic drugs: Secondary | ICD-10-CM | POA: Diagnosis not present

## 2022-02-24 DIAGNOSIS — E785 Hyperlipidemia, unspecified: Secondary | ICD-10-CM | POA: Diagnosis not present

## 2022-02-24 DIAGNOSIS — N9489 Other specified conditions associated with female genital organs and menstrual cycle: Secondary | ICD-10-CM | POA: Diagnosis not present

## 2022-02-24 DIAGNOSIS — E119 Type 2 diabetes mellitus without complications: Secondary | ICD-10-CM

## 2022-02-24 DIAGNOSIS — E1169 Type 2 diabetes mellitus with other specified complication: Secondary | ICD-10-CM

## 2022-02-24 DIAGNOSIS — Z800 Family history of malignant neoplasm of digestive organs: Secondary | ICD-10-CM | POA: Diagnosis not present

## 2022-02-24 DIAGNOSIS — I10 Essential (primary) hypertension: Secondary | ICD-10-CM | POA: Diagnosis not present

## 2022-02-24 DIAGNOSIS — Z8616 Personal history of COVID-19: Secondary | ICD-10-CM | POA: Insufficient documentation

## 2022-02-24 DIAGNOSIS — R1010 Upper abdominal pain, unspecified: Secondary | ICD-10-CM | POA: Diagnosis present

## 2022-02-24 DIAGNOSIS — Z794 Long term (current) use of insulin: Secondary | ICD-10-CM | POA: Insufficient documentation

## 2022-02-24 LAB — CBC WITH DIFFERENTIAL/PLATELET
Abs Immature Granulocytes: 0.1 10*3/uL — ABNORMAL HIGH (ref 0.00–0.07)
Basophils Absolute: 0.1 10*3/uL (ref 0.0–0.1)
Basophils Relative: 0 %
Eosinophils Absolute: 0.1 10*3/uL (ref 0.0–0.5)
Eosinophils Relative: 1 %
HCT: 43.7 % (ref 36.0–46.0)
Hemoglobin: 14.2 g/dL (ref 12.0–15.0)
Immature Granulocytes: 1 %
Lymphocytes Relative: 13 %
Lymphs Abs: 1.8 10*3/uL (ref 0.7–4.0)
MCH: 28.2 pg (ref 26.0–34.0)
MCHC: 32.5 g/dL (ref 30.0–36.0)
MCV: 86.7 fL (ref 80.0–100.0)
Monocytes Absolute: 0.7 10*3/uL (ref 0.1–1.0)
Monocytes Relative: 5 %
Neutro Abs: 11.7 10*3/uL — ABNORMAL HIGH (ref 1.7–7.7)
Neutrophils Relative %: 80 %
Platelets: 258 10*3/uL (ref 150–400)
RBC: 5.04 MIL/uL (ref 3.87–5.11)
RDW: 13 % (ref 11.5–15.5)
WBC: 14.4 10*3/uL — ABNORMAL HIGH (ref 4.0–10.5)
nRBC: 0 % (ref 0.0–0.2)

## 2022-02-24 LAB — COMPREHENSIVE METABOLIC PANEL
ALT: 16 U/L (ref 0–35)
ALT: 47 U/L — ABNORMAL HIGH (ref 0–44)
AST: 13 U/L (ref 0–37)
AST: 74 U/L — ABNORMAL HIGH (ref 15–41)
Albumin: 4.4 g/dL (ref 3.5–5.2)
Albumin: 4.2 g/dL (ref 3.5–5.0)
Alkaline Phosphatase: 68 U/L (ref 39–117)
Alkaline Phosphatase: 69 U/L (ref 38–126)
Anion gap: 7 (ref 5–15)
BUN: 17 mg/dL (ref 6–23)
BUN: 18 mg/dL (ref 6–20)
CO2: 24 mEq/L (ref 19–32)
CO2: 21 mmol/L — ABNORMAL LOW (ref 22–32)
Calcium: 9.7 mg/dL (ref 8.4–10.5)
Calcium: 9.2 mg/dL (ref 8.9–10.3)
Chloride: 107 mEq/L (ref 96–112)
Chloride: 108 mmol/L (ref 98–111)
Creatinine, Ser: 0.73 mg/dL (ref 0.40–1.20)
Creatinine, Ser: 0.66 mg/dL (ref 0.44–1.00)
GFR, Estimated: >60 mL/min (ref >60)
GFR: 99.52 mL/min (ref >60.00)
Glucose, Bld: 101 mg/dL — ABNORMAL HIGH (ref 70–99)
Glucose, Bld: 127 mg/dL — ABNORMAL HIGH (ref 70–99)
Potassium: 4.3 mEq/L (ref 3.5–5.1)
Potassium: 3.7 mmol/L (ref 3.5–5.1)
Sodium: 140 mEq/L (ref 135–145)
Sodium: 136 mmol/L (ref 135–145)
Total Bilirubin: 0.6 mg/dL (ref 0.2–1.2)
Total Bilirubin: 1 mg/dL (ref 0.3–1.2)
Total Protein: 7.3 g/dL (ref 6.0–8.3)
Total Protein: 7.4 g/dL (ref 6.5–8.1)

## 2022-02-24 LAB — LIPASE, BLOOD: Lipase: 44 U/L (ref 11–51)

## 2022-02-24 LAB — LIPID PANEL
Cholesterol: 192 mg/dL (ref 0–200)
HDL: 42.8 mg/dL (ref >39.00)
NonHDL: 149.09
Total CHOL/HDL Ratio: 4
Triglycerides: 242 mg/dL — ABNORMAL HIGH (ref 0.0–149.0)
VLDL: 48.4 mg/dL — ABNORMAL HIGH (ref 0.0–40.0)

## 2022-02-24 LAB — HEMOGLOBIN A1C: Hgb A1c MFr Bld: 5.6 % (ref 4.6–6.5)

## 2022-02-24 LAB — LACTIC ACID, PLASMA: Lactic Acid, Venous: 1.2 mmol/L (ref 0.5–1.9)

## 2022-02-24 LAB — LDL CHOLESTEROL, DIRECT: Direct LDL: 115 mg/dL

## 2022-02-24 MED ORDER — MORPHINE SULFATE (PF) 4 MG/ML IV SOLN
4.0000 mg | Freq: Once | INTRAVENOUS | Status: AC
  Administered 2022-02-24: 4 mg via INTRAVENOUS
  Filled 2022-02-24: qty 1

## 2022-02-24 MED ORDER — METFORMIN HCL ER 500 MG PO TB24
500.0000 mg | ORAL_TABLET | Freq: Every day | ORAL | 1 refills | Status: DC
  Filled 2022-02-24: qty 90, 90d supply, fill #0

## 2022-02-24 MED ORDER — PENCICLOVIR 1 % EX CREA
TOPICAL_CREAM | CUTANEOUS | 0 refills | Status: DC
  Filled 2022-02-24: qty 5, 5d supply, fill #0
  Filled 2022-04-04 – 2022-04-06 (×5): qty 5, 30d supply, fill #0

## 2022-02-24 MED ORDER — CELECOXIB 200 MG PO CAPS
200.0000 mg | ORAL_CAPSULE | Freq: Every day | ORAL | 0 refills | Status: DC
  Filled 2022-02-24: qty 30, 30d supply, fill #0

## 2022-02-24 MED ORDER — SODIUM CHLORIDE 0.9 % IV BOLUS
1000.0000 mL | Freq: Once | INTRAVENOUS | Status: AC
  Administered 2022-02-24: 1000 mL via INTRAVENOUS

## 2022-02-24 MED ORDER — ONDANSETRON HCL 4 MG/2ML IJ SOLN
4.0000 mg | Freq: Once | INTRAMUSCULAR | Status: AC
  Administered 2022-02-24: 4 mg via INTRAVENOUS
  Filled 2022-02-24: qty 2

## 2022-02-24 NOTE — Assessment & Plan Note (Signed)
10 yr risk is low  Based on prior labs.  Repeat labs are pending ;  Will recommend statin for ten year risk> 8%   Lab Results  Component Value Date   CHOL 171 09/23/2021   HDL 38.80 (L) 09/23/2021   LDLCALC 107 (H) 07/08/2020   LDLDIRECT 104.0 09/23/2021   TRIG 204.0 (H) 09/23/2021   CHOLHDL 4 09/23/2021

## 2022-02-24 NOTE — Assessment & Plan Note (Signed)
Colon ca screening discussed.  Referring for colonoscopy given maternal GM history of colon CA

## 2022-02-24 NOTE — Patient Instructions (Addendum)
CONGRATULATIONS ON THE WEIGHT LOSS    I DON'T ADVISE SWITCHING TO MOUNJARO BECAUSE YOU WILL HAVE TO START AT THE LOWEST DOSE AND YOU WILL LOSE GROUND   ADD THE METFORMIN, ESPECIALLY ON THE HUNGER DAYS   celebrex one daily for 30 days to help the inflammation in the ankle .

## 2022-02-24 NOTE — Progress Notes (Signed)
Subjective:  Patient ID: Lisa [name], female    DOB: 02-Jun-1975  Age: 46 y.o. MRN: [id number]  CC: The primary encounter diagnosis was Type 2 diabetes mellitus without complication, without long-term current use of insulin ([place]). Diagnoses of Hyperlipidemia associated with type 2 diabetes mellitus ([place]), Family history of colon cancer requiring screening colonoscopy, and Morbid obesity ([place]) were also pertinent to this visit.   HPI ASHIYAH PAVLAK presents for  Chief Complaint  Patient presents with   Follow-up    3 month follow up on diabetes and weight loss management    Ankle Injury    Fall 6 wks ago with sprained ankle   1) obesity managed with wEGOVY  now on 2.4 mg started in August 2022 at which time she weighed 287 lbs ,, (weight 297 in Oct 2021) . NOT EXERCISING .  Reviewed work schedule:  4 10 day shifts,   2) lleft ankle sprain: 3 weeks ago,   swelling ,  emerge ortho managing  MRI planned if not resolved.  Discussed adding celebrex.   Outpatient Medications Prior to Visit  Medication Sig Dispense Refill   acetaminophen (TYLENOL) 500 MG tablet Take 1,000 mg by mouth every 6 (six) hours as needed.     cetirizine (ZYRTEC) 10 MG tablet Take 10 mg by mouth 2 (two) times daily.      cholecalciferol (VITAMIN D3) 25 MCG (1000 UNIT) tablet Take 1,000 Units by mouth daily.     cyclobenzaprine (FLEXERIL) 10 MG tablet Take 1 tablet (10 mg total) by mouth 3 (three) times daily as needed for muscle spasms. 90 tablet 1   etonogestrel (NEXPLANON) 68 MG IMPL implant 1 each by Subdermal route once.     fluticasone (FLONASE) 50 MCG/ACT nasal spray PLACE 2 SPRAYS INTO BOTH NOSTRILS DAILY. 48 g 1   Fluticasone Furoate (ARNUITY ELLIPTA) 100 MCG/ACT AEPB Inhale 1 puff into the lungs daily. 30 each 3   folic acid (FOLVITE) 1 MG tablet Take 1 tablet (1 mg total) by mouth daily. 90 tablet 1   Insulin Pen Needle (PEN NEEDLES) 32G X 4 MM MISC Use weekly with ozempic pen 100 each 0    methylPREDNISolone (MEDROL DOSEPAK) 4 MG TBPK tablet USE AS DIRECTED 21 tablet 0   Misc Natural Products (TART CHERRY ADVANCED PO) Take 1 capsule by mouth daily.     omeprazole (PRILOSEC) 40 MG capsule TAKE 1 CAPSULE BY MOUTH DAILY. 90 capsule 1   Semaglutide-Weight Management (WEGOVY) 2.4 MG/0.75ML SOAJ Inject 2.4 mg into the skin once a week for 28 days. 3 mL 0   Turmeric 400 MG CAPS Take 400 mg by mouth daily.      DENAVIR 1 % cream APPLY TOPICALLY EVERY TWO HOURS FOR 4 DAYS AS NEEDED FOR COLD SORES 5 g 0   gabapentin (NEURONTIN) 100 MG capsule TAKE 2 CAPSULES (200 MG) BY MOUTH AT BEDTIME. (Patient not taking: Reported on 11/15/2021) 180 capsule 3   No facility-administered medications prior to visit.    Review of Systems;  Patient denies headache, fevers, malaise, unintentional weight loss, skin rash, eye pain, sinus congestion and sinus pain, sore throat, dysphagia,  hemoptysis , cough, dyspnea, wheezing, chest pain, palpitations, orthopnea, edema, abdominal pain, nausea, melena, diarrhea, constipation, flank pain, dysuria, hematuria, urinary  Frequency, nocturia, numbness, tingling, seizures,  Focal weakness, Loss of consciousness,  Tremor, insomnia, depression, anxiety, and suicidal ideation.      Objective:  BP 108/64   Pulse 90   Temp 97.7  F (36.5 C) (Oral)   Ht 5' 2"  (1.575 m)   Wt 221 lb 9.6 oz (100.5 kg)   SpO2 98%   BMI 40.53 kg/m   BP Readings from Last 3 Encounters:  02/24/22 108/64  01/14/22 122/78  11/15/21 110/82    Wt Readings from Last 3 Encounters:  02/24/22 221 lb 9.6 oz (100.5 kg)  01/14/22 226 lb 9.6 oz (102.8 kg)  11/15/21 227 lb 3.2 oz (103.1 kg)    General appearance: alert, cooperative and appears stated age Ears: normal TM's and external ear canals both ears Throat: lips, mucosa, and tongue normal; teeth and gums normal Neck: no adenopathy, no carotid bruit, supple, symmetrical, trachea midline and thyroid not enlarged, symmetric, no  tenderness/mass/nodules Back: symmetric, no curvature. ROM normal. No CVA tenderness. Lungs: clear to auscultation bilaterally Heart: regular rate and rhythm, S1, S2 normal, no murmur, click, rub or gallop Abdomen: soft, non-tender; bowel sounds normal; no masses,  no organomegaly Pulses: 2+ and symmetric Skin: Skin color, texture, turgor normal. No rashes or lesions Lymph nodes: Cervical, supraclavicular, and axillary nodes normal. Neuro:  awake and interactive with normal mood and affect. Higher cortical functions are normal. Speech is clear without word-finding difficulty or dysarthria. Extraocular movements are intact. Visual fields of both eyes are grossly intact. Sensation to light touch is grossly intact bilaterally of upper and lower extremities. Motor examination shows 4+/5 symmetric hand grip and upper extremity and 5/5 lower extremity strength. There is no pronation or drift. Gait is non-ataxic   Lab Results  Component Value Date   HGBA1C 5.7 09/23/2021   HGBA1C 6.2 04/13/2021   HGBA1C 6.9 (H) 01/07/2021    Lab Results  Component Value Date   CREATININE 0.74 11/15/2021   CREATININE 0.98 11/08/2021   CREATININE 0.73 09/23/2021    Lab Results  Component Value Date   WBC 12.3 (H) 11/08/2021   HGB 15.1 (H) 11/08/2021   HCT 46.1 (H) 11/08/2021   PLT 301 11/08/2021   GLUCOSE 153 (H) 11/15/2021   CHOL 171 09/23/2021   TRIG 204.0 (H) 09/23/2021   HDL 38.80 (L) 09/23/2021   LDLDIRECT 104.0 09/23/2021   LDLCALC 107 (H) 07/08/2020   ALT 21 09/23/2021   AST 15 09/23/2021   NA 136 11/15/2021   K 3.6 11/15/2021   CL 104 11/15/2021   CREATININE 0.74 11/15/2021   BUN 23 11/15/2021   CO2 21 11/15/2021   TSH 2.55 09/23/2021   HGBA1C 5.7 09/23/2021   MICROALBUR <0.7 06/17/2021    MM 3D SCREEN BREAST BILATERAL  Result Date: 02/03/2022 CLINICAL DATA:  Screening. EXAM: DIGITAL SCREENING BILATERAL MAMMOGRAM WITH TOMOSYNTHESIS AND CAD TECHNIQUE: Bilateral screening digital  craniocaudal and mediolateral oblique mammograms were obtained. Bilateral screening digital breast tomosynthesis was performed. The images were evaluated with computer-aided detection. COMPARISON:  Previous exam(s). ACR Breast Density Category b: There are scattered areas of fibroglandular density. FINDINGS: There are no findings suspicious for malignancy. IMPRESSION: No mammographic evidence of malignancy. A result letter of this screening mammogram will be mailed directly to the patient. RECOMMENDATION: Screening mammogram in one year. (Code:SM-B-01Y) BI-RADS CATEGORY  1: Negative. Electronically Signed   By: Lovey Newcomer M.D.   On: 02/03/2022 06:53    Assessment & Plan:   Problem List Items Addressed This Visit     Morbid obesity [facility])    She has lost 75 lbs since Oct 2021,  75 since starting GLP 1 agonist therapy  She is at the maximal dose of Wegovy and  worried about plateauing.  She has not tried adding metformin and is resistant to the idea of exercising .  counselling on the myriad benefits of exercise and challanged her to create time for walking by starting with the 3 days per week that she does not work. Adding metformin XR 500 mg today       Relevant Medications   metFORMIN (GLUCOPHAGE-XR) 500 MG 24 hr tablet   Type 2 diabetes mellitus without complications (H[facility]) - Primary    Marked improvement  in a1c since starting GLP 1 agonist,  Currently on wegovy .continue at 2.4 mg weekly and add metformin .  She has no proteinuria ,  Repeat lipids to be done today and will recommend statin if LDL is > 70       Relevant Medications   metFORMIN (GLUCOPHAGE-XR) 500 MG 24 hr tablet   Other Relevant Orders   Hemoglobin A1c   Comp Met (CMET)   Hyperlipidemia associated with type 2 diabetes mellitus ([place])    10 yr risk is low  Based on prior labs.  Repeat labs are pending ;  Will recommend statin for ten year risk> 8%   Lab Results  Component Value Date   CHOL 171 09/23/2021   HDL 38.80 (L)  09/23/2021   LDLCALC 107 (H) 07/08/2020   LDLDIRECT 104.0 09/23/2021   TRIG 204.0 (H) 09/23/2021   CHOLHDL 4 09/23/2021         Relevant Medications   metFORMIN (GLUCOPHAGE-XR) 500 MG 24 hr tablet   Other Relevant Orders   Lipid Profile   Direct LDL   Family history of colon cancer requiring screening colonoscopy    Colon ca screening discussed.  Referring for colonoscopy given maternal GM history of colon CA       Relevant Orders   Ambulatory referral to Gastroenterology    I spent a total of 30 minutes with this patient in a face to face visit on the date of this encounter reviewing the last office visit with me in   May,   patient's diet and exercise habits, counselling on the benefits of exercise  , and post visit ordering of testing and therapeutics.    Follow-up: No follow-ups on file.   Crecencio Mc, MD

## 2022-02-24 NOTE — Assessment & Plan Note (Signed)
Marked improvement  in a1c since starting GLP 1 agonist,  Currently on wegovy .continue at 2.4 mg weekly and add metformin .  She has no proteinuria ,  Repeat lipids to be done today and will recommend statin if LDL is > 70

## 2022-02-24 NOTE — Assessment & Plan Note (Signed)
She has lost 75 lbs since Oct 2021,  75 since starting GLP 1 agonist therapy  She is at the maximal dose of Wegovy and worried about plateauing.  She has not tried adding metformin and is resistant to the idea of exercising .  counselling on the myriad benefits of exercise and challanged her to create time for walking by starting with the 3 days per week that she does not work. Adding metformin XR 500 mg today

## 2022-02-24 NOTE — ED Triage Notes (Signed)
Pt was laying in bed and developed a sudden abdominal pain that radiates to her back. Ppt has not taken any thing for pain yet.

## 2022-02-24 NOTE — ED Provider Triage Note (Signed)
Emergency Medicine Provider Triage Evaluation Note  Lisa [name] , a 46 y.o. female  was evaluated in triage.  Pt complains of sharp epigastric abdominal pain.  Pain began today.  Nausea but no emesis.  No diarrhea or constipation.  No urinary changes.  Patient states that the pain radiates from her abdomen into her back.  No chronic GI issues..  Review of Systems  Positive: Epigastric and right upper quadrant abdominal pain Negative: Emesis, diarrhea, constipation, urinary changes  Physical Exam  There were no vitals taken for this visit. Gen:   Awake, no distress   Resp:  Normal effort  MSK:   Moves extremities without difficulty  Other:  Bowel sounds present.  Tender in the epigastric region  Medical Decision Making  Medically screening exam initiated at 10:40 PM.  Appropriate orders placed.  Lisa [name] was informed that the remainder of the evaluation will be completed by another provider, this initial triage assessment does not replace that evaluation, and the importance of remaining in the ED until their evaluation is complete.  Patient presents to the ED with epigastric and right upper quadrant abdominal pain.  Patient has tenderness on exam.  Will order labs and imaging.   Darletta Moll, PA-C 02/24/22 2243

## 2022-02-25 ENCOUNTER — Other Ambulatory Visit: Payer: Self-pay

## 2022-02-25 ENCOUNTER — Telehealth: Payer: Self-pay

## 2022-02-25 ENCOUNTER — Encounter: Payer: Self-pay | Admitting: Internal Medicine

## 2022-02-25 ENCOUNTER — Emergency Department: Payer: No Typology Code available for payment source

## 2022-02-25 ENCOUNTER — Emergency Department
Admission: EM | Admit: 2022-02-25 | Discharge: 2022-02-25 | Disposition: A | Discharge status: Home / Self Care | Payer: No Typology Code available for payment source | Attending: Emergency Medicine | Admitting: Emergency Medicine

## 2022-02-25 DIAGNOSIS — Z800 Family history of malignant neoplasm of digestive organs: Secondary | ICD-10-CM

## 2022-02-25 DIAGNOSIS — K8020 Calculus of gallbladder without cholecystitis without obstruction: Secondary | ICD-10-CM

## 2022-02-25 DIAGNOSIS — Z1211 Encounter for screening for malignant neoplasm of colon: Secondary | ICD-10-CM

## 2022-02-25 LAB — URINALYSIS, ROUTINE W REFLEX MICROSCOPIC
Bilirubin Urine: NEGATIVE
Glucose, UA: NEGATIVE mg/dL
Hgb urine dipstick: NEGATIVE
Ketones, ur: NEGATIVE mg/dL
Leukocytes,Ua: NEGATIVE
Nitrite: NEGATIVE
Protein, ur: NEGATIVE mg/dL
Specific Gravity, Urine: 1.015 (ref 1.005–1.030)
pH: 5 (ref 5.0–8.0)

## 2022-02-25 LAB — HCG, QUANTITATIVE, PREGNANCY: hCG, Beta Chain, Quant, S: <1 m[IU]/mL (ref <5)

## 2022-02-25 MED ORDER — ONDANSETRON 4 MG PO TBDP
4.0000 mg | ORAL_TABLET | Freq: Four times a day (QID) | ORAL | 0 refills | Status: DC | PRN
  Filled 2022-02-25: qty 20, 5d supply, fill #0

## 2022-02-25 MED ORDER — HYDROCODONE-ACETAMINOPHEN 5-325 MG PO TABS
2.0000 | ORAL_TABLET | Freq: Four times a day (QID) | ORAL | 0 refills | Status: DC | PRN
  Filled 2022-02-25: qty 16, 2d supply, fill #0

## 2022-02-25 NOTE — ED Provider Notes (Signed)
[facility] Provider Note    Event Date/Time   First MD Initiated Contact with Patient 02/25/22 0124     (approximate)   History   Abdominal Pain and Back Pain   HPI  Lisa [name] is a 46 y.o. female with history of hypertension, diabetes, obesity with significant recent weight loss on Wegovy who presents to the ED with complaints of upper abdominal pain that started after eating pork chops for dinner.  No vomiting or diarrhea.  No fever.  No vaginal bleeding or discharge.  Did have some dysuria several days ago that has resolved.  Has had previous C-sections.  Pain does radiate into her back.   History provided by patient and husband.    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   PONV (postoperative nausea and vomiting)    Pre-syncope 07/14/2014   Shortness of breath    "wheezing"   Weakness of left arm 03/03/2020    Past Surgical History:  Procedure Laterality Date   CESAREAN SECTION     x 2   MICROLARYNGOSCOPY WITH DILATION N/A 03/13/2020   Procedure: MICROLARYNGOSCOPY WITH DILATION w/JET VENT MITOMYCIN C;  Surgeon: Christia Reading, MD;  Location: [facility] OR;  Service: ENT;  Laterality: N/A;   MICROLARYNGOSCOPY WITH LASER AND BALLOON DILATION N/A 07/20/2012   Procedure: MICROLARYNGOSCOPY WITH LASER AND BALLOON DILATION;  Surgeon: Christia Reading, MD;  Location: [facility] OR;  Service: ENT;  Laterality: N/A;  WITH JET VENTURI VENTILATION   TONGUE FLAP RELEASE     widom extractions      MEDICATIONS:  Prior  to Admission medications   Medication Sig Start Date End Date Taking? Authorizing Provider  acetaminophen (TYLENOL) 500 MG tablet Take 1,000 mg by mouth every 6 (six) hours as needed.    [provider]  celecoxib (CELEBREX) 200 MG capsule Take 1 capsule (200 mg total) by mouth daily. 02/24/22   Sherlene Shams, MD  cetirizine (ZYRTEC) 10 MG tablet Take 10 mg by mouth 2 (two) times daily.     [provider]  cholecalciferol (VITAMIN D3) 25 MCG (1000 UNIT) tablet Take 1,000 Units by mouth daily.    [provider]  cyclobenzaprine (FLEXERIL) 10 MG tablet Take 1 tablet (10 mg total) by mouth 3 (three) times daily as needed for muscle spasms. 09/23/21   Sherlene Shams, MD  etonogestrel (NEXPLANON) 68 MG IMPL implant 1 each by Subdermal route once.    [provider]  fluticasone (FLONASE) 50 MCG/ACT nasal spray PLACE 2 SPRAYS INTO BOTH NOSTRILS DAILY. 01/17/22 01/17/23  Eulis Foster, FNP  Fluticasone Furoate (ARNUITY ELLIPTA) 100 MCG/ACT AEPB Inhale 1 puff into the lungs daily. 11/10/21   Salena Saner, MD  folic acid (FOLVITE) 1 MG tablet Take 1 tablet (1 mg total) by mouth daily. 09/23/21   Sherlene Shams, MD  Insulin Pen Needle (PEN NEEDLES) 32G X 4 MM MISC Use weekly with ozempic pen 01/07/21   Sherlene Shams,  MD  metFORMIN (GLUCOPHAGE-XR) 500 MG 24 hr tablet Take 1 tablet (500 mg total) by mouth daily with breakfast. 02/24/22   Crecencio Mc, MD  methylPREDNISolone (MEDROL DOSEPAK) 4 MG TBPK tablet USE AS DIRECTED 02/18/22   Kris Hartmann, NP  Misc Natural Products (TART CHERRY ADVANCED PO) Take 1 capsule by mouth daily.    [provider]  omeprazole (PRILOSEC) 40 MG capsule TAKE 1 CAPSULE BY MOUTH DAILY. 10/25/21 10/25/22  Crecencio Mc, MD  penciclovir (DENAVIR) 1 % cream APPLY TOPICALLY EVERY TWO HOURS FOR 4 DAYS AS NEEDED FOR COLD SORES 02/24/22   Crecencio Mc, MD  Semaglutide-Weight Management (WEGOVY) 2.4 MG/0.75ML SOAJ Inject 2.4 mg  into the skin once a week for 28 days. 02/11/22 03/14/22  Crecencio Mc, MD  Turmeric 400 MG CAPS Take 400 mg by mouth daily.     [provider]  levalbuterol Penne Lash HFA) 45 MCG/ACT inhaler Inhale 2 puffs into the lungs every 6 (six) hours as needed for wheezing. 11/09/21 11/10/21  Einar Pheasant, MD    Physical Exam   Triage Vital Signs: ED Triage Vitals [02/24/22 2241]  Enc Vitals Group     BP 132/86     Pulse Rate 94     Resp 18     Temp 97.7 F (36.5 C)     Temp src      SpO2 93 %     Weight 221 lb (100.2 kg)     Height      Head Circumference      Peak Flow      Pain Score 6     Pain Loc      Pain Edu?      Excl. in [place]?     Most recent vital signs: Vitals:   02/24/22 2241  BP: 132/86  Pulse: 94  Resp: 18  Temp: 97.7 F (36.5 C)  SpO2: 93%    CONSTITUTIONAL: Alert and oriented and responds appropriately to questions. Well-appearing; well-nourished HEAD: Normocephalic, atraumatic EYES: Conjunctivae clear, pupils appear equal, sclera nonicteric ENT: normal nose; moist mucous membranes NECK: Supple, normal ROM CARD: RRR; S1 and S2 appreciated; no murmurs, no clicks, no rubs, no gallops RESP: Normal chest excursion without splinting or tachypnea; breath sounds clear and equal bilaterally; no wheezes, no rhonchi, no rales, no hypoxia or respiratory distress, speaking full sentences ABD/GI: Normal bowel sounds; non-distended; soft, non-tender, no rebound, no guarding, no peritoneal signs BACK: The back appears normal EXT: Normal ROM in all joints; no deformity noted, no edema; no cyanosis SKIN: Normal color for age and race; warm; no rash on exposed skin NEURO: Moves all extremities equally, normal speech PSYCH: The patient's mood and manner are appropriate.   ED Results / Procedures / Treatments   LABS: (all labs ordered are listed, but only abnormal results are displayed) Labs Reviewed  COMPREHENSIVE METABOLIC PANEL - Abnormal; Notable for the  following components:      Result Value   CO2 21 (*)    Glucose, Bld 127 (*)    AST 74 (*)    ALT 47 (*)    All other components within normal limits  CBC WITH DIFFERENTIAL/PLATELET - Abnormal; Notable for the following components:   WBC 14.4 (*)    Neutro Abs 11.7 (*)    Abs Immature Granulocytes 0.10 (*)    All other components within normal limits  URINALYSIS, ROUTINE W REFLEX MICROSCOPIC - Abnormal; Notable for the following components:   Color,  Urine YELLOW (*)    APPearance CLEAR (*)    All other components within normal limits  LACTIC ACID, PLASMA  LIPASE, BLOOD  HCG, QUANTITATIVE, PREGNANCY     EKG:  EKG Interpretation  Date/Time:  Thursday February 24 2022 22:47:50 EDT Ventricular Rate:  98 PR Interval:  140 QRS Duration: 76 QT Interval:  356 QTC Calculation: 454 R Axis:   64 Text Interpretation: Normal sinus rhythm Normal ECG When compared with ECG of 08-Nov-2021 21:08, PREVIOUS ECG IS PRESENT Confirmed by Rochele Raring [phone]) on 02/25/2022 1:06:48 AM         RADIOLOGY: My personal review and interpretation of imaging: Right upper quadrant ultrasound shows cholelithiasis without cholecystitis or choledocholithiasis.  I have personally reviewed all radiology reports.   US ABDOMEN LIMITED RUQ (LIVER/GB)  Result Date: 02/25/2022 CLINICAL DATA:  Right upper quadrant abdominal pain EXAM: ULTRASOUND ABDOMEN LIMITED RIGHT UPPER QUADRANT COMPARISON:  None Available. FINDINGS: Gallbladder: Multiple layering gallstones are seen within a distended gallbladder. However, there is no gallbladder wall thickening, and no pericholecystic fluid is identified. The sonographic Eulah Pont sign is reportedly negative. Common bile duct: Diameter: 3 mm in proximal diameter Liver: Hepatic parenchymal echogenicity is diffusely increased and there is poor acoustic through transmission in keeping with mild to moderate hepatic steatosis. No focal intrahepatic masses are seen and there is no  intrahepatic biliary ductal dilation. Portal vein is patent on color Doppler imaging with normal direction of blood flow towards the liver. Other: None. IMPRESSION: 1. Cholelithiasis without sonographic evidence of acute cholecystitis. 2. Mild to moderate hepatic steatosis. Electronically Signed   By: Helyn Numbers M.D.   On: 02/25/2022 02:54     PROCEDURES:  Critical Care performed: No    Procedures    IMPRESSION / MDM / ASSESSMENT AND PLAN / ED COURSE  I reviewed the triage vital signs and the nursing notes.    Patient here with upper abdominal pain that radiates into her back worse after eating pork chops tonight.     DIFFERENTIAL DIAGNOSIS (includes but not limited to):   Biliary colic, cholelithiasis, less likely cholecystitis, cholangitis, choledocholithiasis.  Differential also includes gastritis, GERD, pancreatitis.  Less likely appendicitis.  Low suspicion for ACS, PE or dissection.   Patient's presentation is most consistent with acute presentation with potential threat to life or bodily function.   PLAN: Patient's work-up was initiated in triage.  Labs do show slight leukocytosis of 14,000 with left shift.  AST and ALT minimally elevated but other LFTs and lipase normal.  Lactic normal.  Urine shows no sign of infection.  Pregnancy test negative.  Patient was given morphine upfront she states she is already feeling much better.  Will obtain right upper quadrant ultrasound for further evaluation.   MEDICATIONS GIVEN IN ED: Medications  sodium chloride 0.9 % bolus 1,000 mL (0 mLs Intravenous Stopped 02/25/22 0338)  morphine (PF) 4 MG/ML injection 4 mg (4 mg Intravenous Given 02/24/22 2255)  ondansetron (ZOFRAN) injection 4 mg (4 mg Intravenous Given 02/24/22 2255)     ED COURSE:  Ultrasound reviewed and interpreted by myself and the radiologist and shows cholelithiasis without cholecystitis, choledocholithiasis.  Patient's pain is still been very well controlled here  and she declines any further pain medication.  I feel she is safe for discharge home with instructions to eat a low-fat diet, with prescriptions for pain and nausea medicine and general surgery follow-up.  Discussed return precautions with patient and husband and they are comfortable with this plan.  At this time, I do not feel there is any life-threatening condition present. I reviewed all nursing notes, vitals, pertinent previous records.  All lab and urine results, EKGs, imaging ordered have been independently reviewed and interpreted by myself.  I reviewed all available radiology reports from any imaging ordered this visit.  Based on my assessment, I feel the patient is safe to be discharged home without further emergent workup and can continue workup as an outpatient as needed. Discussed all findings, treatment plan as well as usual and customary return precautions.  They verbalize understanding and are comfortable with this plan.  Outpatient follow-up has been provided as needed.  All questions have been answered.    CONSULTS: No emergent surgical intervention needed at this time.  No cholecystitis, choledocholithiasis and pain is been well controlled after just 1 dose of morphine here.  She is appropriate for further outpatient management.   OUTSIDE RECORDS REVIEWED: Reviewed patient's last PCP note yesterday.       FINAL CLINICAL IMPRESSION(S) / ED DIAGNOSES   Final diagnoses:  Gallstones     Rx / DC Orders   ED Discharge Orders          Ordered    HYDROcodone-acetaminophen (NORCO/VICODIN) 5-325 MG tablet  Every 6 hours PRN        02/25/22 0429    ondansetron (ZOFRAN-ODT) 4 MG disintegrating tablet  Every 6 hours PRN        02/25/22 0429             Note:  This document was prepared using Dragon voice recognition software and may include unintentional dictation errors.   Ardenia Stiner, Layla Maw, DO 02/25/22 1256

## 2022-02-25 NOTE — Telephone Encounter (Signed)
Gastroenterology Pre-Procedure Review  Request Date: TBD (Pt will call back) Requesting Physician: Dr. Marius Ditch  PATIENT REVIEW QUESTIONS: The patient responded to the following health history questions as indicated:    1. Are you having any GI issues?  Gallbladder attack last night  02/24/22 2. Do you have a personal history of Polyps? no 3. Do you have a family history of Colon Cancer or Polyps? Yes maternal grandmother colon cancer 4. Diabetes Mellitus? Yes takes Mancel Parsons will advise to hold 7 days prior, Metformin will advise to hold 2 days prior 5. Joint replacements in the past 12 months?no 6. Major health problems in the past 3 months?no 7. Any artificial heart valves, MVP, or defibrillator?no    MEDICATIONS & ALLERGIES:    Patient reports the following regarding taking any anticoagulation/antiplatelet therapy:   Plavix, Coumadin, Eliquis, Xarelto, Lovenox, Pradaxa, Brilinta, or Effient? no Aspirin? no  Patient confirms/reports the following medications:  Current Outpatient Medications  Medication Sig Dispense Refill   acetaminophen (TYLENOL) 500 MG tablet Take 1,000 mg by mouth every 6 (six) hours as needed.     celecoxib (CELEBREX) 200 MG capsule Take 1 capsule (200 mg total) by mouth daily. 30 capsule 0   cetirizine (ZYRTEC) 10 MG tablet Take 10 mg by mouth 2 (two) times daily.      cholecalciferol (VITAMIN D3) 25 MCG (1000 UNIT) tablet Take 1,000 Units by mouth daily.     cyclobenzaprine (FLEXERIL) 10 MG tablet Take 1 tablet (10 mg total) by mouth 3 (three) times daily as needed for muscle spasms. 90 tablet 1   etonogestrel (NEXPLANON) 68 MG IMPL implant 1 each by Subdermal route once.     fluticasone (FLONASE) 50 MCG/ACT nasal spray PLACE 2 SPRAYS INTO BOTH NOSTRILS DAILY. 48 g 1   Fluticasone Furoate (ARNUITY ELLIPTA) 100 MCG/ACT AEPB Inhale 1 puff into the lungs daily. 30 each 3   folic acid (FOLVITE) 1 MG tablet Take 1 tablet (1 mg total) by mouth daily. 90 tablet 1    HYDROcodone-acetaminophen (NORCO/VICODIN) 5-325 MG tablet Take 2 tablets by mouth every 6 (six) hours as needed. 16 tablet 0   Insulin Pen Needle (PEN NEEDLES) 32G X 4 MM MISC Use weekly with ozempic pen 100 each 0   metFORMIN (GLUCOPHAGE-XR) 500 MG 24 hr tablet Take 1 tablet (500 mg total) by mouth daily with breakfast. 90 tablet 1   methylPREDNISolone (MEDROL DOSEPAK) 4 MG TBPK tablet USE AS DIRECTED 21 tablet 0   Misc Natural Products (TART CHERRY ADVANCED PO) Take 1 capsule by mouth daily.     omeprazole (PRILOSEC) 40 MG capsule TAKE 1 CAPSULE BY MOUTH DAILY. 90 capsule 1   ondansetron (ZOFRAN-ODT) 4 MG disintegrating tablet Take 1 tablet (4 mg total) by mouth every 6 (six) hours as needed for nausea or vomiting. 20 tablet 0   penciclovir (DENAVIR) 1 % cream APPLY TOPICALLY EVERY TWO HOURS FOR 4 DAYS AS NEEDED FOR COLD SORES 5 g 0   Semaglutide-Weight Management (WEGOVY) 2.4 MG/0.75ML SOAJ Inject 2.4 mg into the skin once a week for 28 days. 3 mL 0   Turmeric 400 MG CAPS Take 400 mg by mouth daily.      No current facility-administered medications for this visit.    Patient confirms/reports the following allergies:  Allergies  Allergen Reactions   Latex Hives    Rash and hives    No orders of the defined types were placed in this encounter.   AUTHORIZATION INFORMATION Primary Insurance: 1D#: Group #:  Secondary Insurance: 1D#: Group #:  SCHEDULE INFORMATION: Date:  Time: Location:

## 2022-02-25 NOTE — Discharge Instructions (Addendum)

## 2022-02-28 ENCOUNTER — Ambulatory Visit: Payer: No Typology Code available for payment source

## 2022-03-01 ENCOUNTER — Encounter: Payer: Self-pay | Admitting: Surgery

## 2022-03-01 ENCOUNTER — Telehealth: Payer: Self-pay | Admitting: Surgery

## 2022-03-01 ENCOUNTER — Ambulatory Visit: Payer: No Typology Code available for payment source | Admitting: Surgery

## 2022-03-01 VITALS — BP 122/76 | HR 77 | Temp 97.9°F | Ht 62.0 in | Wt 221.2 lb

## 2022-03-01 DIAGNOSIS — K8010 Calculus of gallbladder with chronic cholecystitis without obstruction: Secondary | ICD-10-CM | POA: Diagnosis not present

## 2022-03-01 NOTE — Patient Instructions (Signed)
Our surgery scheduler Barbara will call you within 24-48 hours to get you scheduled. If you have not heard from her after 48 hours, please call our office. Have the blue sheet available when she calls to write down important information.   If you have any concerns or questions, please feel free to call our office.    Minimally Invasive Cholecystectomy  Minimally invasive cholecystectomy is surgery to remove the gallbladder. The gallbladder is a pear-shaped organ that lies beneath the liver on the right side of the body. The gallbladder stores bile, which is a fluid that helps the body digest fats. Cholecystectomy is often done to treat inflammation (irritation and swelling) of the gallbladder (cholecystitis). This condition is usually caused by a buildup of gallstones (cholelithiasis) in the gallbladder or when the fluid in the gall bladder becomes stagnant because gallstones get stuck in the ducts (tubes) and block the flow of bile. This can result in inflammation and pain. In severe cases, emergency surgery may be required. This procedure is done through small incisions in the abdomen, instead of one large incision. It is also called laparoscopic surgery. A thin scope with a camera (laparoscope) is inserted through one incision. Then surgical instruments are inserted through the other incisions. In some cases, a minimally invasive surgery may need to be changed to a surgery that is done through a larger incision. This is called open surgery. Tell a health care provider about: Any allergies you have. All medicines you are taking, including vitamins, herbs, eye drops, creams, and over-the-counter medicines. Any problems you or family members have had with anesthetic medicines. Any bleeding problems you have. Any surgeries you have had. Any medical conditions you have. Whether you are pregnant or may be pregnant. What are the risks? Generally, this is a safe procedure. However, problems may occur,  including: Infection. Bleeding. Allergic reactions to medicines. Damage to nearby structures or organs. A gallstone remaining in the common bile duct. The common bile duct carries bile from the gallbladder to the small intestine. A bile leak from the liver or cystic duct after your gallbladder is removed. What happens before the procedure? When to stop eating and drinking Follow instructions from your health care provider about what you may eat and drink before your procedure. These may include: 8 hours before the procedure Stop eating most foods. Do not eat meat, fried foods, or fatty foods. Eat only light foods, such as toast or crackers. All liquids are okay except energy drinks and alcohol. 6 hours before the procedure Stop eating. Drink only clear liquids, such as water, clear fruit juice, black coffee, plain tea, and sports drinks. Do not drink energy drinks or alcohol. 2 hours before the procedure Stop drinking all liquids. You may be allowed to take medicines with small sips of water. If you do not follow your health care provider's instructions, your procedure may be delayed or canceled. Medicines Ask your health care provider about: Changing or stopping your regular medicines. This is especially important if you are taking diabetes medicines or blood thinners. Taking medicines such as aspirin and ibuprofen. These medicines can thin your blood. Do not take these medicines unless your health care provider tells you to take them. Taking over-the-counter medicines, vitamins, herbs, and supplements. General instructions If you will be going home right after the procedure, plan to have a responsible adult: Take you home from the hospital or clinic. You will not be allowed to drive. Care for you for the time you are   told. Do not use any products that contain nicotine or tobacco for at least 4 weeks before the procedure. These products include cigarettes, chewing tobacco, and vaping  devices, such as e-cigarettes. If you need help quitting, ask your health care provider. Ask your health care provider: How your surgery site will be marked. What steps will be taken to help prevent infection. These may include: Removing hair at the surgery site. Washing skin with a germ-killing soap. Taking antibiotic medicine. What happens during the procedure?  An IV will be inserted into one of your veins. You will be given one or both of the following: A medicine to help you relax (sedative). A medicine to make you fall asleep (general anesthetic). Your surgeon will make several small incisions in your abdomen. The laparoscope will be inserted through one of the small incisions. The camera on the laparoscope will send images to a monitor in the operating room. This lets your surgeon see inside your abdomen. A gas will be pumped into your abdomen. This will expand your abdomen to give the surgeon more room to perform the surgery. Other tools that are needed for the procedure will be inserted through the other incisions. The gallbladder will be removed through one of the incisions. Your common bile duct may be examined. If stones are found in the common bile duct, they may be removed. After your gallbladder has been removed, the incisions will be closed with stitches (sutures), staples, or skin glue. Your incisions will be covered with a bandage (dressing). The procedure may vary among health care providers and hospitals. What happens after the procedure? Your blood pressure, heart rate, breathing rate, and blood oxygen level will be monitored until you leave the hospital or clinic. You will be given medicines as needed to control your pain. You may have a drain placed in the incision. The drain will be removed a day or two after the procedure. Summary Minimally invasive cholecystectomy, also called laparoscopic cholecystectomy, is surgery to remove the gallbladder using small  incisions. Tell your health care provider about all the medical conditions you have and all the medicines you are taking for those conditions. Before the procedure, follow instructions about when to stop eating and drinking and changing or stopping medicines. Plan to have a responsible adult care for you for the time you are told after you leave the hospital or clinic. This information is not intended to replace advice given to you by your health care provider. Make sure you discuss any questions you have with your health care provider. Document Revised: 11/03/2020 Document Reviewed: 11/03/2020 Elsevier Patient Education  2023 [employer]  

## 2022-03-01 NOTE — H&P (View-Only) (Signed)
Patient ID: Lisa [name], female   DOB: Sep 06, 1975, 46 y.o.   MRN: [id number]  Chief Complaint: Epigastric pain  History of Present Illness Lisa [name] is a 46 y.o. female with her last gallbladder attack occurring 5 days ago.  She presented to the ED where she was evaluated.  This was broadband epigastric pain with radiation across the back into the scapula.  She denies any nausea, vomiting, fevers or chills.  She reports having loose stools since that time.  She has been instructed to avoid fatty foods.  Denies any history of jaundice, melena or hematochezia.  Past Medical History Past Medical History:  Diagnosis Date   Bronchitis    hx of   Cervical radiculitis [id number]   Complication of anesthesia    COVID-19 10/2020   DDD (degenerative disc disease), cervical 02/28/2020   Dysrhythmia    "does not see cardiologist"   Family history of anesthesia complication    "morphine night terrors"   GERD (gastroesophageal reflux disease)    Headache(784.0)    "migraines"   Hypertension    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 and 2021   PONV (postoperative nausea and vomiting)    Pre-syncope 07/14/2014   Shortness of breath    "wheezing"   Weakness of left arm 03/03/2020      Past Surgical History:  Procedure Laterality Date   CESAREAN SECTION     x 2   MICROLARYNGOSCOPY WITH DILATION N/A 03/13/2020   Procedure: MICROLARYNGOSCOPY WITH DILATION w/JET VENT MITOMYCIN C;  Surgeon: Melida Quitter, MD;  Location: [facility];  Service: ENT;  Laterality: N/A;   MICROLARYNGOSCOPY WITH LASER AND BALLOON DILATION N/A 07/20/2012   Procedure: MICROLARYNGOSCOPY WITH LASER AND BALLOON DILATION;  Surgeon: Melida Quitter, MD;  Location: [facility] OR;  Service: ENT;  Laterality: N/A;  WITH JET VENTURI VENTILATION   TONGUE FLAP RELEASE     widom extractions      Allergies  Allergen Reactions   Latex Hives    Rash and hives    Current Outpatient Medications   Medication Sig Dispense Refill   acetaminophen (TYLENOL) 500 MG tablet Take 1,000 mg by mouth every 6 (six) hours as needed.     celecoxib (CELEBREX) 200 MG capsule Take 1 capsule (200 mg total) by mouth daily. 30 capsule 0   cetirizine (ZYRTEC) 10 MG tablet Take 10 mg by mouth 2 (two) times daily.      cholecalciferol (VITAMIN D3) 25 MCG (1000 UNIT) tablet Take 1,000 Units by mouth daily.     cyclobenzaprine (FLEXERIL) 10 MG tablet Take 1 tablet (10 mg total) by mouth 3 (three) times daily as needed for muscle spasms. 90 tablet 1   etonogestrel (NEXPLANON) 68 MG IMPL implant 1 each by Subdermal route once.     fluticasone (FLONASE) 50 MCG/ACT nasal spray PLACE 2 SPRAYS INTO BOTH NOSTRILS DAILY. 48 g 1   Fluticasone Furoate (ARNUITY ELLIPTA) 100 MCG/ACT AEPB Inhale 1 puff into the lungs daily. 30 each 3   folic acid (FOLVITE) 1 MG tablet Take 1 tablet (1 mg total) by mouth daily. 90 tablet 1   HYDROcodone-acetaminophen (NORCO/VICODIN) 5-325 MG tablet Take 2 tablets by mouth every 6 (six) hours as needed. 16 tablet 0   Insulin Pen Needle (PEN NEEDLES) 32G X 4 MM MISC Use weekly with ozempic pen 100 each 0   metFORMIN (GLUCOPHAGE-XR) 500 MG 24 hr tablet Take 1 tablet (500 mg total) by mouth daily with breakfast.  90 tablet 1   methylPREDNISolone (MEDROL DOSEPAK) 4 MG TBPK tablet USE AS DIRECTED 21 tablet 0   Misc Natural Products (TART CHERRY ADVANCED PO) Take 1 capsule by mouth daily.     omeprazole (PRILOSEC) 40 MG capsule TAKE 1 CAPSULE BY MOUTH DAILY. 90 capsule 1   ondansetron (ZOFRAN-ODT) 4 MG disintegrating tablet Take 1 tablet (4 mg total) by mouth every 6 (six) hours as needed for nausea or vomiting. 20 tablet 0   penciclovir (DENAVIR) 1 % cream APPLY TOPICALLY EVERY TWO HOURS FOR 4 DAYS AS NEEDED FOR COLD SORES 5 g 0   Semaglutide-Weight Management (WEGOVY) 2.4 MG/0.75ML SOAJ Inject 2.4 mg into the skin once a week for 28 days. 3 mL 0   Turmeric 400 MG CAPS Take 400 mg by mouth daily.       No current facility-administered medications for this visit.    Family History Family History  Problem Relation Age of Onset   Hypertension Mother    Hyperlipidemia Mother    Arthritis Mother    Rheum arthritis Mother    Hypertension Father    Hyperlipidemia Father    Cancer Maternal Grandmother        COLON CANCER   Breast cancer Neg Hx       Social History Social History   Tobacco Use   Smoking status: Never   Smokeless tobacco: Never  Vaping Use   Vaping Use: Never used  Substance Use Topics   Alcohol use: Yes    Comment: "social"   Drug use: No        Review of Systems  Constitutional:  Positive for weight loss.  HENT: Negative.    Eyes: Negative.   Respiratory: Negative.    Cardiovascular: Negative.   Gastrointestinal:  Positive for abdominal pain and diarrhea. Negative for blood in stool, heartburn, melena, nausea and vomiting.  Genitourinary: Negative.   Skin: Negative.   Neurological: Negative.   Psychiatric/Behavioral: Negative.        Physical Exam Blood pressure 122/76, pulse 77, temperature 97.9 F (36.6 C), temperature source Oral, height 5\' 2"  (1.575 m), weight 221 lb 3.2 oz (100.3 kg), SpO2 99 %. Last Weight  Most recent update: 03/01/2022  8:40 AM    Weight  100.3 kg (221 lb 3.2 oz)             CONSTITUTIONAL: Well developed, and nourished, appropriately responsive and aware without distress.   EYES: Sclera non-icteric.   EARS, NOSE, MOUTH AND THROAT:  The oropharynx is clear. Oral mucosa is pink and moist.     Hearing is intact to voice.  NECK: Trachea is midline, and there is no jugular venous distension.  LYMPH NODES:  Lymph nodes in the neck are not enlarged. RESPIRATORY:  Lungs are clear, and breath sounds are equal bilaterally. Normal respiratory effort without pathologic use of accessory muscles. CARDIOVASCULAR: Heart is regular in rate and rhythm. GI: The abdomen is soft, nontender, and nondistended. There were no palpable  masses. I did not appreciate hepatosplenomegaly. There were normal bowel sounds. MUSCULOSKELETAL:  Symmetrical muscle tone appreciated in all four extremities.    SKIN: Skin turgor is normal. No pathologic skin lesions appreciated.  NEUROLOGIC:  Motor and sensation appear grossly normal.  Cranial nerves are grossly without defect. PSYCH:  Alert and oriented to person, place and time. Affect is appropriate for situation.  Data Reviewed I have personally reviewed what is currently available of the patient's imaging, recent labs and medical records.  Labs:     Latest Ref Rng & Units 02/24/2022   10:50 PM 11/08/2021    5:13 PM 09/23/2021    9:37 AM  CBC  WBC 4.0 - 10.5 K/uL 14.4  12.3  7.4   Hemoglobin 12.0 - 15.0 g/dL 14.2  15.1  13.7   Hematocrit 36.0 - 46.0 % 43.7  46.1  41.9   Platelets 150 - 400 K/uL 258  301  247.0       Latest Ref Rng & Units 02/24/2022   10:50 PM 02/24/2022    9:16 AM 11/15/2021    1:36 PM  CMP  Glucose 70 - 99 mg/dL 127  101  153   BUN 6 - 20 mg/dL 18  17  23    Creatinine 0.44 - 1.00 mg/dL 0.66  0.73  0.74   Sodium 135 - 145 mmol/L 136  140  136   Potassium 3.5 - 5.1 mmol/L 3.7  4.3  3.6   Chloride 98 - 111 mmol/L 108  107  104   CO2 22 - 32 mmol/L 21  24  21    Calcium 8.9 - 10.3 mg/dL 9.2  9.7  9.7   Total Protein 6.5 - 8.1 g/dL 7.4  7.3    Total Bilirubin 0.3 - 1.2 mg/dL 1.0  0.6    Alkaline Phos 38 - 126 U/L 69  68    AST 15 - 41 U/L 74  13    ALT 0 - 44 U/L 47  16        Imaging: Radiology review:  CLINICAL DATA:  Right upper quadrant abdominal pain   EXAM: ULTRASOUND ABDOMEN LIMITED RIGHT UPPER QUADRANT   COMPARISON:  None Available.   FINDINGS: Gallbladder:   Multiple layering gallstones are seen within a distended gallbladder. However, there is no gallbladder wall thickening, and no pericholecystic fluid is identified. The sonographic Percell Miller sign is reportedly negative.   Common bile was cholecystitis:   Diameter: 3 mm in  proximal diameter   Liver:   Hepatic parenchymal echogenicity is diffusely increased and there is poor acoustic through transmission in keeping with mild to moderate hepatic steatosis. No focal intrahepatic masses are seen and there is no intrahepatic biliary ductal dilation. Portal vein is patent on color Doppler imaging with normal direction of blood flow towards the liver.   Other: None.   IMPRESSION: 1. Cholelithiasis without sonographic evidence of acute cholecystitis. 2. Mild to moderate hepatic steatosis.     Electronically Signed   By: Fidela Salisbury M.D.   On: 02/25/2022 02:54  Within last 24 hrs: No results found.  Assessment    Chronic calculus cholecystitis Patient Active Problem List   Diagnosis Date Noted   Family history of colon cancer requiring screening colonoscopy 02/24/2022   Trigger point of left shoulder region 10/06/2021   Cervical radiculitis 03/03/2020   Weakness of left arm 03/03/2020   Cervicalgia 02/28/2020   Spondylosis, cervical, 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   Loud snoring 08/01/2019   B12 deficiency 08/01/2019   Eczema of left external ear 08/01/2019   Hyperlipidemia associated with type 2 diabetes mellitus ([place]) 07/31/2019   Nonallopathic lesion of cervical region 04/29/2019   Nonallopathic lesion of thoracic region 04/29/2019   Nonallopathic lesion of rib cage 04/29/2019   Cervical radiculopathy at C7 03/26/2019   Allergic rhinitis due to pollen 08/18/2018   Type 2 diabetes mellitus without complications ([place]) [id number]   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   Encounter for general adult medical examination with abnormal findings  04/29/2016   Low back pain without sciatica 04/28/2015   Morbid obesity ([place]) 04/28/2015    Plan    Robotic cholecystectomy with ICG imaging.  This was discussed thoroughly.  Optimal plan is for robotic cholecystectomy utilizing ICG imaging. Risks and benefits have been discussed with the patient which  include but are not limited to anesthesia, bleeding, infection, biliary ductal injury, resulting in leak or stenosis, other associated unanticipated injuries affiliated with laparoscopic surgery.   Reviewed that removing the gallbladder will only address the symptoms related to the gallbladder itself.  I believe there is the desire to proceed, accepting the risks with understanding.  Questions elicited and answered to satisfaction.    No guarantees ever expressed or implied.   Face-to-face time spent with the patient and accompanying care providers(if present) was 30 minutes, with more than 50% of the time spent counseling, educating, and coordinating care of the patient.    These notes generated with voice recognition software. I apologize for typographical errors.  Ronny Bacon M.D., FACS 03/01/2022, 9:07 AM

## 2022-03-01 NOTE — Telephone Encounter (Signed)
Called, left message for patient to call. Please inform her of the following regarding scheduled surgery.   Patient has been advised of Pre-Admission date/time, and Surgery date at [facility].  Surgery Date: 03/09/22 Preadmission Testing Date: 03/03/22 (phone 8a-1p)  Also patient will need to call at [phone], between 1-3:00pm the day before surgery, to find out what time to arrive for surgery.

## 2022-03-01 NOTE — Progress Notes (Signed)
Patient ID: Lisa [name], female   DOB: [birth year], 46 y.o.   MRN: [id number]  Chief Complaint: Epigastric pain  History of Present Illness Lisa [name] is a 46 y.o. female with her last gallbladder attack occurring 5 days ago.  She presented to the ED where she was evaluated.  This was broadband epigastric pain with radiation across the back into the scapula.  She denies any nausea, vomiting, fevers or chills.  She reports having loose stools since that time.  She has been instructed to avoid fatty foods.  Denies any history of jaundice, melena or hematochezia.  Past Medical History Past Medical History:  Diagnosis Date  . Bronchitis    hx of  . Cervical radiculitis 03/03/2020  . Complication of anesthesia   . COVID-19 10/2020  . DDD (degenerative disc disease), cervical 02/28/2020  . Dysrhythmia    "does not see cardiologist"  . Family history of anesthesia complication    "morphine night terrors"  . GERD (gastroesophageal reflux disease)   . Headache(784.0)    "migraines"  . Hypertension   . 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 and 2021  . PONV (postoperative nausea and vomiting)   . Pre-syncope 07/14/2014  . Shortness of breath    "wheezing"  . Weakness of left arm 03/03/2020      Past Surgical History:  Procedure Laterality Date  . CESAREAN SECTION     x 2  . MICROLARYNGOSCOPY WITH DILATION N/A 03/13/2020   Procedure: MICROLARYNGOSCOPY WITH DILATION w/JET VENT MITOMYCIN C;  Surgeon: Christia Reading, MD;  Location: [facility] OR;  Service: ENT;  Laterality: N/A;  . MICROLARYNGOSCOPY WITH LASER AND BALLOON DILATION N/A 07/20/2012   Procedure: MICROLARYNGOSCOPY WITH LASER AND BALLOON DILATION;  Surgeon: Christia Reading, MD;  Location: [facility] OR;  Service: ENT;  Laterality: N/A;  WITH JET VENTURI VENTILATION  . TONGUE FLAP RELEASE    . widom extractions      Allergies  Allergen Reactions  . Latex Hives    Rash and hives    Current Outpatient  Medications  Medication Sig Dispense Refill  . acetaminophen (TYLENOL) 500 MG tablet Take 1,000 mg by mouth every 6 (six) hours as needed.    . celecoxib (CELEBREX) 200 MG capsule Take 1 capsule (200 mg total) by mouth daily. 30 capsule 0  . cetirizine (ZYRTEC) 10 MG tablet Take 10 mg by mouth 2 (two) times daily.     . cholecalciferol (VITAMIN D3) 25 MCG (1000 UNIT) tablet Take 1,000 Units by mouth daily.    . cyclobenzaprine (FLEXERIL) 10 MG tablet Take 1 tablet (10 mg total) by mouth 3 (three) times daily as needed for muscle spasms. 90 tablet 1  . etonogestrel (NEXPLANON) 68 MG IMPL implant 1 each by Subdermal route once.    . fluticasone (FLONASE) 50 MCG/ACT nasal spray PLACE 2 SPRAYS INTO BOTH NOSTRILS DAILY. 48 g 1  . Fluticasone Furoate (ARNUITY ELLIPTA) 100 MCG/ACT AEPB Inhale 1 puff into the lungs daily. 30 each 3  . folic acid (FOLVITE) 1 MG tablet Take 1 tablet (1 mg total) by mouth daily. 90 tablet 1  . HYDROcodone-acetaminophen (NORCO/VICODIN) 5-325 MG tablet Take 2 tablets by mouth every 6 (six) hours as needed. 16 tablet 0  . Insulin Pen Needle (PEN NEEDLES) 32G X 4 MM MISC Use weekly with ozempic pen 100 each 0  . metFORMIN (GLUCOPHAGE-XR) 500 MG 24 hr tablet Take 1 tablet (500 mg total) by mouth daily with breakfast.  90 tablet 1  . methylPREDNISolone (MEDROL DOSEPAK) 4 MG TBPK tablet USE AS DIRECTED 21 tablet 0  . Misc Natural Products (TART CHERRY ADVANCED PO) Take 1 capsule by mouth daily.    Marland Kitchen omeprazole (PRILOSEC) 40 MG capsule TAKE 1 CAPSULE BY MOUTH DAILY. 90 capsule 1  . ondansetron (ZOFRAN-ODT) 4 MG disintegrating tablet Take 1 tablet (4 mg total) by mouth every 6 (six) hours as needed for nausea or vomiting. 20 tablet 0  . penciclovir (DENAVIR) 1 % cream APPLY TOPICALLY EVERY TWO HOURS FOR 4 DAYS AS NEEDED FOR COLD SORES 5 g 0  . Semaglutide-Weight Management (WEGOVY) 2.4 MG/0.75ML SOAJ Inject 2.4 mg into the skin once a week for 28 days. 3 mL 0  . Turmeric 400 MG CAPS  Take 400 mg by mouth daily.      No current facility-administered medications for this visit.    Family History Family History  Problem Relation Age of Onset  . Hypertension Mother   . Hyperlipidemia Mother   . Arthritis Mother   . Rheum arthritis Mother   . Hypertension Father   . Hyperlipidemia Father   . Cancer Maternal Grandmother        COLON CANCER  . Breast cancer Neg Hx       Social History Social History   Tobacco Use  . Smoking status: Never  . Smokeless tobacco: Never  Vaping Use  . Vaping Use: Never used  Substance Use Topics  . Alcohol use: Yes    Comment: "social"  . Drug use: No        Review of Systems  Constitutional:  Positive for weight loss.  HENT: Negative.    Eyes: Negative.   Respiratory: Negative.    Cardiovascular: Negative.   Gastrointestinal:  Positive for abdominal pain and diarrhea. Negative for blood in stool, heartburn, melena, nausea and vomiting.  Genitourinary: Negative.   Skin: Negative.   Neurological: Negative.   Psychiatric/Behavioral: Negative.        Physical Exam Blood pressure 122/76, pulse 77, temperature 97.9 F (36.6 C), temperature source Oral, height 5\' 2"  (1.575 m), weight 221 lb 3.2 oz (100.3 kg), SpO2 99 %. Last Weight  Most recent update: 03/01/2022  8:40 AM    Weight  100.3 kg (221 lb 3.2 oz)             CONSTITUTIONAL: Well developed, and nourished, appropriately responsive and aware without distress. ***  EYES: Sclera non-icteric.   EARS, NOSE, MOUTH AND THROAT: Mask worn.  *** The oropharynx is clear. Oral mucosa is pink and moist.  Dentition: ***   Hearing is intact to voice.  NECK: Trachea is midline, and there is no jugular venous distension.  LYMPH NODES:  Lymph nodes in the neck are not enlarged. RESPIRATORY:  Lungs are clear, and breath sounds are equal bilaterally. Normal respiratory effort without pathologic use of accessory muscles. CARDIOVASCULAR: Heart is regular in rate and  rhythm. GI: The abdomen is *** soft, nontender, and nondistended. There were no palpable masses. I did not appreciate hepatosplenomegaly. There were normal bowel sounds. GU: *** MUSCULOSKELETAL:  Symmetrical muscle tone appreciated in all four extremities.    SKIN: Skin turgor is normal. No pathologic skin lesions appreciated.  NEUROLOGIC:  Motor and sensation appear grossly normal.  Cranial nerves are grossly without defect. PSYCH:  Alert and oriented to person, place and time. Affect is appropriate for situation.  Data Reviewed I have personally reviewed what is currently available of the patient's  imaging, recent labs and medical records.   Labs:     Latest Ref Rng & Units 02/24/2022   10:50 PM 11/08/2021    5:13 PM 09/23/2021    9:37 AM  CBC  WBC 4.0 - 10.5 K/uL 14.4  12.3  7.4   Hemoglobin 12.0 - 15.0 g/dL 14.2  15.1  13.7   Hematocrit 36.0 - 46.0 % 43.7  46.1  41.9   Platelets 150 - 400 K/uL 258  301  247.0       Latest Ref Rng & Units 02/24/2022   10:50 PM 02/24/2022    9:16 AM 11/15/2021    1:36 PM  CMP  Glucose 70 - 99 mg/dL 127  101  153   BUN 6 - 20 mg/dL 18  17  23    Creatinine 0.44 - 1.00 mg/dL 0.66  0.73  0.74   Sodium 135 - 145 mmol/L 136  140  136   Potassium 3.5 - 5.1 mmol/L 3.7  4.3  3.6   Chloride 98 - 111 mmol/L 108  107  104   CO2 22 - 32 mmol/L 21  24  21    Calcium 8.9 - 10.3 mg/dL 9.2  9.7  9.7   Total Protein 6.5 - 8.1 g/dL 7.4  7.3    Total Bilirubin 0.3 - 1.2 mg/dL 1.0  0.6    Alkaline Phos 38 - 126 U/L 69  68    AST 15 - 41 U/L 74  13    ALT 0 - 44 U/L 47  16     ***   Imaging: Radiology review:  CLINICAL DATA:  Right upper quadrant abdominal pain   EXAM: ULTRASOUND ABDOMEN LIMITED RIGHT UPPER QUADRANT   COMPARISON:  None Available.   FINDINGS: Gallbladder:   Multiple layering gallstones are seen within a distended gallbladder. However, there is no gallbladder wall thickening, and no pericholecystic fluid is identified. The sonographic  Percell Miller sign is reportedly negative.   Common bile duct:   Diameter: 3 mm in proximal diameter   Liver:   Hepatic parenchymal echogenicity is diffusely increased and there is poor acoustic through transmission in keeping with mild to moderate hepatic steatosis. No focal intrahepatic masses are seen and there is no intrahepatic biliary ductal dilation. Portal vein is patent on color Doppler imaging with normal direction of blood flow towards the liver.   Other: None.   IMPRESSION: 1. Cholelithiasis without sonographic evidence of acute cholecystitis. 2. Mild to moderate hepatic steatosis.     Electronically Signed   By: Fidela Salisbury M.D.   On: 02/25/2022 02:54  Within last 24 hrs: No results found.  Assessment    *** Patient Active Problem List   Diagnosis Date Noted  . Family history of colon cancer requiring screening colonoscopy 02/24/2022  . Trigger point of left shoulder region 10/06/2021  . Cervical radiculitis 03/03/2020  . Weakness of left arm 03/03/2020  . Cervicalgia 02/28/2020  . Spondylosis, cervical, 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  . Loud snoring 08/01/2019  . B12 deficiency 08/01/2019  . Eczema of left external ear 08/01/2019  . Hyperlipidemia associated with type 2 diabetes mellitus ([place]) 07/31/2019  . Nonallopathic lesion of cervical region 04/29/2019  . Nonallopathic lesion of thoracic region 04/29/2019  . Nonallopathic lesion of rib cage 04/29/2019  . Cervical radiculopathy at C7 03/26/2019  . Allergic rhinitis due to pollen 08/18/2018  . Type 2 diabetes mellitus without complications ([place]) 86/76/7209  . 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 Encounter for general adult  medical examination with abnormal findings 04/29/2016  . Low back pain without sciatica 04/28/2015  . Morbid obesity ([facility]) 04/28/2015    Plan    ***  Face-to-face time spent with the patient and accompanying care providers(if present) was *** minutes, with more than 50% of the time  spent counseling, educating, and coordinating care of the patient.    These notes generated with voice recognition software. I apologize for typographical errors.  Campbell Lerner M.D., FACS 03/01/2022, 9:07 AM

## 2022-03-01 NOTE — Telephone Encounter (Signed)
Patient calls back, she is informed of all dates of her surgery.

## 2022-03-02 ENCOUNTER — Ambulatory Visit: Payer: Self-pay | Admitting: Surgery

## 2022-03-02 DIAGNOSIS — K8010 Calculus of gallbladder with chronic cholecystitis without obstruction: Secondary | ICD-10-CM

## 2022-03-02 HISTORY — DX: Calculus of gallbladder with chronic cholecystitis without obstruction: K80.10

## 2022-03-03 ENCOUNTER — Encounter
Admission: RE | Admit: 2022-03-03 | Discharge: 2022-03-03 | Disposition: A | Discharge status: Home / Self Care | Payer: No Typology Code available for payment source | Source: Ambulatory Visit | Attending: Surgery | Admitting: Surgery

## 2022-03-03 VITALS — Ht 62.0 in | Wt 216.0 lb

## 2022-03-03 DIAGNOSIS — E119 Type 2 diabetes mellitus without complications: Secondary | ICD-10-CM

## 2022-03-03 DIAGNOSIS — Z01812 Encounter for preprocedural laboratory examination: Secondary | ICD-10-CM

## 2022-03-03 NOTE — Patient Instructions (Addendum)
Your procedure is scheduled on: Wednesday March 09, 2022. Report to Day Surgery inside [facility] 2nd floor, stop by registration desk before getting on elevator.  To find out your arrival time please call [phone] between 1PM - 3PM on Tuesday March 08, 2022.  Remember: Instructions that are not followed completely may result in serious medical risk,  up to and including death, or upon the discretion of your surgeon and anesthesiologist your  surgery may need to be rescheduled.     _X__ 1. Do not eat food or drink fluids after midnight the night before your procedure.                 No chewing gum or hard candies.  __X__2.  On the morning of surgery brush your teeth with toothpaste and water, you                may rinse your mouth with mouthwash if you wish.  Do not swallow any toothpaste or mouthwash.     _X__ 3.  No Alcohol for 24 hours before or after surgery.   _X__ 4.  Do Not Smoke or use e-cigarettes For 24 Hours Prior to Your Surgery.                 Do not use any chewable tobacco products for at least 6 hours prior to                 Surgery.  _X__  5.  Do not use any recreational drugs (marijuana, cocaine, heroin, ecstasy, MDMA or other)                For at least one week prior to your surgery.  Combination of these drugs with anesthesia                May have life threatening results.  ____  6.  Bring all medications with you on the day of surgery if instructed.   __X_ 7.  Notify your doctor if there is any change in your medical condition      (cold, fever, infections).     Do not wear jewelry, make-up, hairpins, clips or nail polish. Do not wear lotions, powders, or perfumes. You may wear deodorant. Do not shave 48 hours prior to surgery. Do not bring valuables to the hospital.    [facility] is not responsible for any belongings or valuables.  Contacts, dentures or bridgework may not be worn into surgery. Leave your suitcase  in the car. After surgery it may be brought to your room. For patients admitted to the hospital, discharge time is determined by your treatment team.   Patients discharged the day of surgery will not be allowed to drive home.   Make arrangements for someone to be with you for the first 24 hours of your Same Day Discharge.  __X__ Take these medicines the morning of surgery with A SIP OF WATER:    1. omeprazole (PRILOSEC) 40 MG   2. cetirizine (ZYRTEC) 10 MG tablet  3.   4.  5.  6.  ____ Fleet Enema (as directed)   __X__ Use CHG Soap (or wipes) as directed  ____ Use Benzoyl Peroxide Gel as instructed  __X__ Use inhalers on the day of surgery  Fluticasone Furoate (ARNUITY ELLIPTA) 100 MCG/ACT AEPB  fluticasone (FLONASE) 50 MCG/ACT nasal spray  __X__ Stop metFORMIN (GLUCOPHAGE-XR) 500 MG 2 days prior to surgery (take last dose Monday 03/07/22)  __X__ One week prior to surgery stop Semaglutide-Weight Management (WEGOVY) 2.4 MG/0.75ML SOAJ.  ____ Call your PCP, cardiologist, or Pulmonologist if taking Coumadin/Plavix/aspirin and ask when to stop before your surgery.   __X__ One Week prior to surgery- Stop Anti-inflammatories such as Ibuprofen, Aleve, Advil, Motrin, meloxicam (MOBIC), diclofenac, etodolac, ketorolac, Toradol, Daypro, piroxicam, Goody's or BC powders. OK TO USE TYLENOL IF NEEDED   __X__ One week prior to surgery stop all vitamins and or supplements until after surgery.    ____ Bring C-Pap to the hospital.    If you have any questions regarding your pre-procedure instructions,  Please call Pre-admit Testing at [phone]

## 2022-03-08 ENCOUNTER — Encounter: Payer: Self-pay | Admitting: Surgery

## 2022-03-08 DIAGNOSIS — T8859XA Other complications of anesthesia, initial encounter: Secondary | ICD-10-CM

## 2022-03-09 ENCOUNTER — Encounter
Admission: RE | Disposition: A | Discharge status: Home / Self Care | Payer: Self-pay | Source: Home / Self Care | Attending: Surgery

## 2022-03-09 ENCOUNTER — Encounter: Payer: Self-pay | Admitting: Surgery

## 2022-03-09 ENCOUNTER — Ambulatory Visit
Admission: RE | Admit: 2022-03-09 | Discharge: 2022-03-09 | Disposition: A | Discharge status: Home / Self Care | Payer: No Typology Code available for payment source | Attending: Surgery | Admitting: Surgery

## 2022-03-09 ENCOUNTER — Ambulatory Visit: Payer: No Typology Code available for payment source

## 2022-03-09 ENCOUNTER — Other Ambulatory Visit: Payer: Self-pay

## 2022-03-09 DIAGNOSIS — Z79899 Other long term (current) drug therapy: Secondary | ICD-10-CM | POA: Insufficient documentation

## 2022-03-09 DIAGNOSIS — Z01812 Encounter for preprocedural laboratory examination: Secondary | ICD-10-CM

## 2022-03-09 DIAGNOSIS — I10 Essential (primary) hypertension: Secondary | ICD-10-CM | POA: Diagnosis not present

## 2022-03-09 DIAGNOSIS — E119 Type 2 diabetes mellitus without complications: Secondary | ICD-10-CM | POA: Diagnosis not present

## 2022-03-09 DIAGNOSIS — K219 Gastro-esophageal reflux disease without esophagitis: Secondary | ICD-10-CM | POA: Insufficient documentation

## 2022-03-09 DIAGNOSIS — Z794 Long term (current) use of insulin: Secondary | ICD-10-CM | POA: Insufficient documentation

## 2022-03-09 DIAGNOSIS — K8010 Calculus of gallbladder with chronic cholecystitis without obstruction: Secondary | ICD-10-CM | POA: Diagnosis present

## 2022-03-09 DIAGNOSIS — Z7984 Long term (current) use of oral hypoglycemic drugs: Secondary | ICD-10-CM | POA: Diagnosis not present

## 2022-03-09 DIAGNOSIS — M1990 Unspecified osteoarthritis, unspecified site: Secondary | ICD-10-CM | POA: Insufficient documentation

## 2022-03-09 DIAGNOSIS — T8859XA Other complications of anesthesia, initial encounter: Secondary | ICD-10-CM

## 2022-03-09 HISTORY — PX: CHOLECYSTECTOMY: SHX55

## 2022-03-09 LAB — POCT PREGNANCY, URINE: Preg Test, Ur: NEGATIVE

## 2022-03-09 SURGERY — CHOLECYSTECTOMY, ROBOT-ASSISTED, LAPAROSCOPIC
Anesthesia: General

## 2022-03-09 MED ORDER — CELECOXIB 200 MG PO CAPS
ORAL_CAPSULE | ORAL | Status: AC
  Administered 2022-03-09: 200 mg via ORAL
  Filled 2022-03-09: qty 1

## 2022-03-09 MED ORDER — SODIUM CHLORIDE 0.9 % IV SOLN: INTRAVENOUS | Status: DC

## 2022-03-09 MED ORDER — MIDAZOLAM HCL 2 MG/2ML IJ SOLN
INTRAMUSCULAR | Status: DC | PRN
  Administered 2022-03-09: 2 mg via INTRAVENOUS

## 2022-03-09 MED ORDER — OXYCODONE HCL 5 MG/5ML PO SOLN: 5.0000 mg | Freq: Once | ORAL | Status: AC | PRN

## 2022-03-09 MED ORDER — INDOCYANINE GREEN 25 MG IV SOLR
1.2500 mg | Freq: Once | INTRAVENOUS | Status: AC
  Administered 2022-03-09: 1.25 mg via INTRAVENOUS
  Filled 2022-03-09: qty 0.5

## 2022-03-09 MED ORDER — CEFAZOLIN SODIUM-DEXTROSE 2-4 GM/100ML-% IV SOLN
2.0000 g | INTRAVENOUS | Status: AC
  Administered 2022-03-09: 2 g via INTRAVENOUS

## 2022-03-09 MED ORDER — DIPHENHYDRAMINE HCL 50 MG/ML IJ SOLN
INTRAMUSCULAR | Status: AC
  Administered 2022-03-09: 12.5 mg via INTRAVENOUS
  Filled 2022-03-09: qty 1

## 2022-03-09 MED ORDER — ONDANSETRON HCL 4 MG/2ML IJ SOLN
INTRAMUSCULAR | Status: DC | PRN
  Administered 2022-03-09: 4 mg via INTRAVENOUS

## 2022-03-09 MED ORDER — FENTANYL CITRATE (PF) 100 MCG/2ML IJ SOLN
INTRAMUSCULAR | Status: AC
  Filled 2022-03-09: qty 2

## 2022-03-09 MED ORDER — FENTANYL CITRATE (PF) 100 MCG/2ML IJ SOLN
INTRAMUSCULAR | Status: AC
  Administered 2022-03-09: 50 ug via INTRAVENOUS
  Filled 2022-03-09: qty 2

## 2022-03-09 MED ORDER — DEXMEDETOMIDINE HCL IN NACL 200 MCG/50ML IV SOLN
INTRAVENOUS | Status: DC | PRN
  Administered 2022-03-09 (×2): 8 ug via INTRAVENOUS
  Administered 2022-03-09: 4 ug via INTRAVENOUS

## 2022-03-09 MED ORDER — FENTANYL CITRATE (PF) 100 MCG/2ML IJ SOLN
INTRAMUSCULAR | Status: DC | PRN
  Administered 2022-03-09 (×3): 50 ug via INTRAVENOUS

## 2022-03-09 MED ORDER — OXYCODONE HCL 5 MG PO TABS
ORAL_TABLET | ORAL | Status: AC
  Filled 2022-03-09: qty 1

## 2022-03-09 MED ORDER — CEFAZOLIN SODIUM-DEXTROSE 2-4 GM/100ML-% IV SOLN
INTRAVENOUS | Status: AC
  Filled 2022-03-09: qty 100

## 2022-03-09 MED ORDER — EPHEDRINE 5 MG/ML INJ
INTRAVENOUS | Status: AC
  Filled 2022-03-09: qty 5

## 2022-03-09 MED ORDER — DEXAMETHASONE SODIUM PHOSPHATE 10 MG/ML IJ SOLN
INTRAMUSCULAR | Status: AC
  Filled 2022-03-09: qty 1

## 2022-03-09 MED ORDER — BUPIVACAINE-EPINEPHRINE (PF) 0.25% -1:200000 IJ SOLN
INTRAMUSCULAR | Status: AC
  Filled 2022-03-09: qty 30

## 2022-03-09 MED ORDER — ONDANSETRON HCL 4 MG/2ML IJ SOLN
4.0000 mg | Freq: Once | INTRAMUSCULAR | Status: AC | PRN
  Administered 2022-03-09: 4 mg via INTRAVENOUS

## 2022-03-09 MED ORDER — FENTANYL CITRATE (PF) 100 MCG/2ML IJ SOLN
25.0000 ug | INTRAMUSCULAR | Status: DC | PRN
  Administered 2022-03-09 (×2): 50 ug via INTRAVENOUS

## 2022-03-09 MED ORDER — BUPIVACAINE-EPINEPHRINE (PF) 0.25% -1:200000 IJ SOLN
INTRAMUSCULAR | Status: DC | PRN
  Administered 2022-03-09: 30 mL

## 2022-03-09 MED ORDER — MIDAZOLAM HCL 2 MG/2ML IJ SOLN
INTRAMUSCULAR | Status: AC
  Filled 2022-03-09: qty 2

## 2022-03-09 MED ORDER — ROCURONIUM BROMIDE 10 MG/ML (PF) SYRINGE
PREFILLED_SYRINGE | INTRAVENOUS | Status: AC
  Filled 2022-03-09: qty 10

## 2022-03-09 MED ORDER — ACETAMINOPHEN 500 MG PO TABS: 1000.0000 mg | ORAL_TABLET | ORAL | Status: AC

## 2022-03-09 MED ORDER — CHLORHEXIDINE GLUCONATE CLOTH 2 % EX PADS
6.0000 | MEDICATED_PAD | Freq: Once | CUTANEOUS | Status: AC
  Administered 2022-03-09: 6 via TOPICAL

## 2022-03-09 MED ORDER — ONDANSETRON HCL 4 MG/2ML IJ SOLN
INTRAMUSCULAR | Status: AC
  Filled 2022-03-09: qty 2

## 2022-03-09 MED ORDER — GABAPENTIN 300 MG PO CAPS: 300.0000 mg | ORAL_CAPSULE | ORAL | Status: AC

## 2022-03-09 MED ORDER — ORAL CARE MOUTH RINSE: 15.0000 mL | Freq: Once | OROMUCOSAL | Status: AC

## 2022-03-09 MED ORDER — OXYCODONE HCL 5 MG PO TABS
5.0000 mg | ORAL_TABLET | Freq: Once | ORAL | Status: AC | PRN
  Administered 2022-03-09: 5 mg via ORAL

## 2022-03-09 MED ORDER — DIPHENHYDRAMINE HCL 50 MG/ML IJ SOLN: 12.5000 mg | Freq: Once | INTRAMUSCULAR | Status: AC

## 2022-03-09 MED ORDER — GABAPENTIN 300 MG PO CAPS
ORAL_CAPSULE | ORAL | Status: AC
  Administered 2022-03-09: 300 mg via ORAL
  Filled 2022-03-09: qty 1

## 2022-03-09 MED ORDER — BUPIVACAINE LIPOSOME 1.3 % IJ SUSP: 20.0000 mL | Freq: Once | INTRAMUSCULAR | Status: DC

## 2022-03-09 MED ORDER — PROPOFOL 10 MG/ML IV BOLUS
INTRAVENOUS | Status: AC
  Filled 2022-03-09: qty 20

## 2022-03-09 MED ORDER — ACETAMINOPHEN 500 MG PO TABS
ORAL_TABLET | ORAL | Status: AC
  Administered 2022-03-09: 1000 mg via ORAL
  Filled 2022-03-09: qty 2

## 2022-03-09 MED ORDER — CHLORHEXIDINE GLUCONATE 0.12 % MT SOLN: 15.0000 mL | Freq: Once | OROMUCOSAL | Status: AC

## 2022-03-09 MED ORDER — LIDOCAINE HCL (PF) 2 % IJ SOLN
INTRAMUSCULAR | Status: AC
  Filled 2022-03-09: qty 5

## 2022-03-09 MED ORDER — ROCURONIUM BROMIDE 100 MG/10ML IV SOLN
INTRAVENOUS | Status: DC | PRN
  Administered 2022-03-09: 50 mg via INTRAVENOUS
  Administered 2022-03-09: 10 mg via INTRAVENOUS

## 2022-03-09 MED ORDER — SUGAMMADEX SODIUM 200 MG/2ML IV SOLN
INTRAVENOUS | Status: DC | PRN
  Administered 2022-03-09: 400 mg via INTRAVENOUS

## 2022-03-09 MED ORDER — PHENYLEPHRINE HCL (PRESSORS) 10 MG/ML IV SOLN
INTRAVENOUS | Status: DC | PRN
  Administered 2022-03-09: 160 ug via INTRAVENOUS
  Administered 2022-03-09 (×3): 80 ug via INTRAVENOUS
  Administered 2022-03-09: 160 ug via INTRAVENOUS

## 2022-03-09 MED ORDER — IBUPROFEN 800 MG PO TABS
800.0000 mg | ORAL_TABLET | Freq: Three times a day (TID) | ORAL | 0 refills | Status: DC | PRN
  Filled 2022-03-09: qty 30, 10d supply, fill #0

## 2022-03-09 MED ORDER — CHLORHEXIDINE GLUCONATE 0.12 % MT SOLN
OROMUCOSAL | Status: AC
  Administered 2022-03-09: 15 mL via OROMUCOSAL
  Filled 2022-03-09: qty 15

## 2022-03-09 MED ORDER — DEXAMETHASONE SODIUM PHOSPHATE 10 MG/ML IJ SOLN
INTRAMUSCULAR | Status: DC | PRN
  Administered 2022-03-09: 10 mg via INTRAVENOUS

## 2022-03-09 MED ORDER — PROPOFOL 10 MG/ML IV BOLUS
INTRAVENOUS | Status: DC | PRN
  Administered 2022-03-09: 150 mg via INTRAVENOUS

## 2022-03-09 MED ORDER — LIDOCAINE HCL (CARDIAC) PF 100 MG/5ML IV SOSY
PREFILLED_SYRINGE | INTRAVENOUS | Status: DC | PRN
  Administered 2022-03-09: 100 mg via INTRAVENOUS

## 2022-03-09 MED ORDER — CELECOXIB 200 MG PO CAPS: 200.0000 mg | ORAL_CAPSULE | ORAL | Status: AC

## 2022-03-09 SURGICAL SUPPLY — 45 items
BAG PRESSURE INF REUSE 3000 (BAG) IMPLANT
CLIP LIGATING HEM O LOK PURPLE (MISCELLANEOUS) ×1 IMPLANT
COVER TIP SHEARS 8 DVNC (MISCELLANEOUS) ×1 IMPLANT
COVER TIP SHEARS 8MM DA VINCI (MISCELLANEOUS) ×1
DERMABOND ADVANCED .7 DNX12 (GAUZE/BANDAGES/DRESSINGS) ×1 IMPLANT
DRAPE ARM DVNC X/XI (DISPOSABLE) ×4 IMPLANT
DRAPE COLUMN DVNC XI (DISPOSABLE) ×1 IMPLANT
DRAPE DA VINCI XI ARM (DISPOSABLE) ×4
DRAPE DA VINCI XI COLUMN (DISPOSABLE) ×1
ELECT CAUTERY BLADE 6.4 (BLADE) ×1 IMPLANT
GLOVE ORTHO TXT STRL SZ7.5 (GLOVE) ×2 IMPLANT
GOWN STRL REUS W/ TWL LRG LVL3 (GOWN DISPOSABLE) ×2 IMPLANT
GOWN STRL REUS W/ TWL XL LVL3 (GOWN DISPOSABLE) ×2 IMPLANT
GOWN STRL REUS W/TWL LRG LVL3 (GOWN DISPOSABLE) ×2
GOWN STRL REUS W/TWL XL LVL3 (GOWN DISPOSABLE) ×2
GRASPER SUT TROCAR 14GX15 (MISCELLANEOUS) IMPLANT
IRRIGATION STRYKERFLOW (MISCELLANEOUS) IMPLANT
IRRIGATOR STRYKERFLOW (MISCELLANEOUS)
IRRIGATOR SUCT 8 DISP DVNC XI (IRRIGATION / IRRIGATOR) IMPLANT
IRRIGATOR SUCTION 8MM XI DISP (IRRIGATION / IRRIGATOR)
IV NS IRRIG 3000ML ARTHROMATIC (IV SOLUTION) IMPLANT
KIT PINK PAD W/HEAD ARE REST (MISCELLANEOUS) ×1
KIT PINK PAD W/HEAD ARM REST (MISCELLANEOUS) ×1 IMPLANT
KIT TURNOVER KIT A (KITS) ×1 IMPLANT
LABEL OR SOLS (LABEL) ×1 IMPLANT
MANIFOLD NEPTUNE II (INSTRUMENTS) ×1 IMPLANT
NDL INSUFFLATION 14GA 120MM (NEEDLE) IMPLANT
NEEDLE HYPO 22GX1.5 SAFETY (NEEDLE) ×1 IMPLANT
NEEDLE INSUFFLATION 14GA 120MM (NEEDLE) ×1 IMPLANT
NS IRRIG 500ML POUR BTL (IV SOLUTION) ×1 IMPLANT
PACK LAP CHOLECYSTECTOMY (MISCELLANEOUS) ×1 IMPLANT
SEAL CANN UNIV 5-8 DVNC XI (MISCELLANEOUS) ×4 IMPLANT
SEAL XI 5MM-8MM UNIVERSAL (MISCELLANEOUS) ×4
SET TUBE SMOKE EVAC HIGH FLOW (TUBING) ×1 IMPLANT
SOLUTION ELECTROLUBE (MISCELLANEOUS) ×1 IMPLANT
SPIKE FLUID TRANSFER (MISCELLANEOUS) ×1 IMPLANT
SUT MNCRL 4-0 (SUTURE) ×1
SUT MNCRL 4-0 27XMFL (SUTURE) ×1
SUT VICRYL 0 AB UR-6 (SUTURE) ×1 IMPLANT
SUTURE MNCRL 4-0 27XMF (SUTURE) ×1 IMPLANT
SYS BAG RETRIEVAL 10MM (BASKET) ×1
SYSTEM BAG RETRIEVAL 10MM (BASKET) ×1 IMPLANT
TRAP FLUID SMOKE EVACUATOR (MISCELLANEOUS) ×1 IMPLANT
TROCAR Z-THREAD FIOS 11X100 BL (TROCAR) IMPLANT
WATER STERILE IRR 500ML POUR (IV SOLUTION) ×1 IMPLANT

## 2022-03-09 NOTE — Op Note (Signed)
Robotic cholecystectomy with Indocyamine Green Ductal Imaging.   Pre-operative Diagnosis: Chronic calculus cholecystitis  Post-operative Diagnosis:  Same.  Procedure: Robotic assisted laparoscopic cholecystectomy with Indocyamine Green Ductal Imaging.   Surgeon: Ronny Bacon, M.D., FACS  Anesthesia: General. with endotracheal tube  Findings: minimal adhesions.   Estimated Blood Loss: 15 mL         Drains: None         Specimens: Gallbladder           Complications: none  Procedure Details  The patient was seen again in the Holding Room.  1.25 mg dose of ICG was administered intravenously.   The benefits, complications, treatment options, risks and expected outcomes were again reviewed with the patient. The likelihood of improving the patient's symptoms with return to their baseline status is good.  The patient and/or family concurred with the proposed plan, giving informed consent, again alternatives reviewed.  The patient was taken to Operating Room, identified, and the procedure verified as robotic assisted laparoscopic cholecystectomy.  Prior to the induction of general anesthesia, antibiotic prophylaxis was administered. VTE prophylaxis was in place. General endotracheal anesthesia was then administered and tolerated well. The patient was positioned in the supine position.  After the induction, the abdomen was prepped with Chloraprep and draped in the sterile fashion.  A Time Out was held and the above information confirmed.  After local infiltration of quarter percent Marcaine with epinephrine, stab incision was made left upper quadrant.  Just below the costal margin at Palmer's point, approximately midclavicular line the Veres needle is passed with sensation of the layers to penetrate the abdominal wall and into the peritoneum.  Saline drop test is confirmed peritoneal placement.  Insufflation is initiated with carbon dioxide to pressures of 15 mmHg.  Right para-umbilical  local infiltration with quarter percent Marcaine with epinephrine is utilized.  Made a 12 mm incision on the right periumbilical site, I advanced an optical 99mm port under direct visualization into the peritoneal cavity.  Once the peritoneum was penetrated, insufflation was initiated.  The trocar was then advanced into the abdominal cavity under direct visualization. Pneumoperitoneum was then continued utilizing CO2 at 15 mmHg or less and tolerated well without any adverse changes in the patient's vital signs.  Two 8.5-mm ports were placed in the left lower quadrant and laterally, and one to the right lower quadrant, all under direct vision. All skin incisions  were infiltrated with a local anesthetic agent before making the incision and placing the trocars.  The patient was positioned  in reverse Trendelenburg, tilted the patient's left side down.  Da Vinci XI robot was then positioned on to the patient's left side, and docked.  The gallbladder was identified, the fundus grasped via the arm 4 Prograsp and retracted cephalad. Adhesions were lysed with scissors and cautery.  The infundibulum was identified grasped and retracted laterally, exposing the peritoneum overlying the triangle of Calot. This was then opened and dissected using cautery & scissors. An extended critical view of the cystic duct and cystic artery was obtained, aided by the ICG via FireFly which improved localization of the ductal anatomy.    The cystic duct was clearly identified and dissected to isolation.   Artery well isolated and clipped, and the cystic duct was triple clipped and divided with scissors, as close to the gallbladder neck as feasible, thus leaving two on the remaining stump.  The specimen side of the artery is sealed with bipolar and divided with monopolar scissors.  The gallbladder was taken from the gallbladder fossa in a retrograde fashion with the electrocautery. The gallbladder was removed and placed in an  Endocatch bag.  The liver bed is inspected. Hemostasis was confirmed.  The robot was undocked and moved away from the operative field. No irrigation was utilized. The gallbladder and Endocatch sac were then removed through the infraumbilical port site.   Inspection of the right upper quadrant was performed. No bleeding, bile duct injury or leak, or bowel injury was noted. The infra-umbilical port site fascia was closed with interrumpted 0 Vicryl sutures using PMI/cone under direct visualization. Pneumoperitoneum was released and ports removed.  4-0 subcuticular Monocryl was used to close the skin. Dermabond was  applied.  The patient was then extubated and brought to the recovery room in stable condition. Sponge, lap, and needle counts were correct at closure and at the conclusion of the case.               Ronny Bacon, M.D., FACS 03/09/2022 2:00 PM

## 2022-03-09 NOTE — Interval H&P Note (Signed)
History and Physical Interval Note:  03/09/2022 12:05 PM  Lisa [name]  has presented today for surgery, with the diagnosis of Gallstones.  The various methods of treatment have been discussed with the patient and family. After consideration of risks, benefits and other options for treatment, the patient has consented to  Procedure(s): XI ROBOTIC ASSISTED LAPAROSCOPIC CHOLECYSTECTOMY (N/A) [place] (ICG) (N/A) as a surgical intervention.  The patient's history has been reviewed, patient examined, no change in status, stable for surgery.  I have reviewed the patient's chart and labs.  Questions were answered to the patient's satisfaction.     Ronny Bacon

## 2022-03-09 NOTE — Transfer of Care (Signed)
Immediate Anesthesia Transfer of Care Note  Patient: Lisa [name]  Procedure(s) Performed: XI ROBOTIC ASSISTED LAPAROSCOPIC CHOLECYSTECTOMY INDOCYANINE GREEN FLUORESCENCE IMAGING (ICG)  Patient Location: PACU  Anesthesia Type:General  Level of Consciousness: drowsy and patient cooperative  Airway & Oxygen Therapy: Patient Spontanous Breathing and Patient connected to face mask oxygen  Post-op Assessment: Report given to RN and Post -op Vital signs reviewed and stable  Post vital signs: Reviewed and stable  Last Vitals:  Vitals Value Taken Time  BP 87/49 03/09/22 1400  Temp 36.1 C 03/09/22 1356  Pulse 71 03/09/22 1403  Resp 15 03/09/22 1403  SpO2 91 % 03/09/22 1403  Vitals shown include unvalidated device data.  Last Pain:  Vitals:   03/09/22 1400  TempSrc:   PainSc: Asleep         Complications: No notable events documented.

## 2022-03-09 NOTE — Addendum Note (Signed)
Addendum  created 03/09/22 1627 by Karen Kitchens, NP   Clinical Note Signed

## 2022-03-09 NOTE — Anesthesia Postprocedure Evaluation (Signed)
Anesthesia Post Note  Patient: Lisa [name]  Procedure(s) Performed: XI ROBOTIC ASSISTED LAPAROSCOPIC CHOLECYSTECTOMY INDOCYANINE GREEN FLUORESCENCE IMAGING (ICG)  Patient location during evaluation: PACU Anesthesia Type: General Level of consciousness: awake and alert Pain management: pain level controlled Vital Signs Assessment: post-procedure vital signs reviewed and stable Respiratory status: spontaneous breathing, nonlabored ventilation, respiratory function stable and patient connected to nasal cannula oxygen Cardiovascular status: blood pressure returned to baseline and stable Postop Assessment: no apparent nausea or vomiting Anesthetic complications: no   No notable events documented.   Last Vitals:  Vitals:   03/09/22 1445 03/09/22 1500  BP: (!) 121/55 118/69  Pulse: (!) 58 65  Resp:  11  Temp:    SpO2: (!) 84% 90%    Last Pain:  Vitals:   03/09/22 1425  TempSrc:   PainSc: 3                  Arita Miss

## 2022-03-09 NOTE — Anesthesia Procedure Notes (Signed)
Procedure Name: Intubation Date/Time: 03/09/2022 12:46 PM  Performed by: Jonna Clark, CRNAPre-anesthesia Checklist: Patient identified, Patient being monitored, Timeout performed, Emergency Drugs available and Suction available Patient Re-evaluated:Patient Re-evaluated prior to induction Oxygen Delivery Method: Circle system utilized Preoxygenation: Pre-oxygenation with 100% oxygen Induction Type: IV induction Ventilation: Mask ventilation without difficulty Laryngoscope Size: 3 and McGraph Grade View: Grade I Tube type: Oral Tube size: 6.0 mm Number of attempts: 1 Airway Equipment and Method: Stylet Placement Confirmation: ETT inserted through vocal cords under direct vision, positive ETCO2 and breath sounds checked- equal and bilateral Secured at: 21 cm Tube secured with: Tape Dental Injury: Teeth and Oropharynx as per pre-operative assessment  Comments: 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 No complication with intubation.

## 2022-03-09 NOTE — Progress Notes (Signed)
  Perioperative Services Pre-Admission/Anesthesia Testing    Date: 03/09/22  Name: Lisa [name] MRN:   [id number]  Re: GLP-1 clearance and provider recommendations   Planned Surgical Procedure(s):    Case: [id number] Anesthesia Start Date/Time: 03/09/22 1235   Procedures:      XI ROBOTIC ASSISTED LAPAROSCOPIC CHOLECYSTECTOMY     INDOCYANINE GREEN FLUORESCENCE IMAGING (ICG)   Anesthesia type: General   Pre-op diagnosis: Gallstones   Location: [facility] OR ROOM 04 / [place] [facility]   Surgeons: Ronny Bacon, MD   Clinical Notes:  Patient is scheduled for the above procedure on 03/09/2022 with Dr. Ronny Bacon, MD. In review of her medication reconciliation it was noted that patient is on a prescribed GLP-1 medication. Per guidelines issued by the American Society of Anesthesiologists (ASA), it is recommended that these medications be held for 7 days prior to the patient undergoing any type of elective surgical procedure. The patient is taking the following GLP-1 medication:  [x]  SEMAGLUTIDE   []  EXENATIDE  []  LIRAGLUTIDE   []  LIXISENATIDE  []  DULAGLUTIDE     []  OTHER GLP-1 medication: _______________  Reached out to prescribing provider Derrel Nip, MD) to make them aware of the guidelines from anesthesia. Given that this patient takes the prescribed GLP-1 medication for her  diabetes diagnosis, rather than for weight loss, recommendations from the prescribing provider were solicited. Prescribing provider made aware of the following so that informed decision/POC can be developed for this patient that may be taking medications belonging to these drug classes:  Oral GLP-1 medications will be held 1 day prior to surgery.  Injectable GLP-1 medications will be held 7 days prior to surgery.  Metformin is routinely held 48 hours prior to surgery due to renal concerns, potential need for contrasted imaging perioperatively, and the potential for tissue hypoxia leading to drug  induced lactic acidosis.  All SGLT2i medications are held 72 hours prior to surgery as they can be associated with the increased potential for developing euglycemic diabetic ketoacidosis (EDKA).   Impression and Plan:  Lisa [name] is on a prescribed GLP-1 medication, which induces the known side effect of decreased gastric emptying. Efforts are bring made to mitigate the risk of perioperative hyperglycemic events, as elevated blood glucose levels have been found to contribute to intra/postoperative complications. Additionally, hyperglycemic extremes can potentially necessitate the postponing of a patient's elective case in order to better optimize perioperative glycemic control, again with the aforementioned guidelines in place. With this in mind, recommendations have been sought from the prescribing provider, who has cleared patient to proceed with holding the prescribed GLP-1 as per the guidelines from the ASA.   Provider recommending: no further recommendations received from the prescribing provider.  Copy of signed clearance and recommendations placed on patient's chart for inclusion in their medical record and for review by the surgical/anesthetic team on the day of her procedure.   Honor Loh, MSN, APRN, FNP-C, CEN [facility]  Peri-operative Services Nurse Practitioner Phone: [phone] 03/09/22 4:26 PM  NOTE: This note has been prepared using Dragon dictation software. Despite my best ability to proofread, there is always the potential that unintentional transcriptional errors may still occur from this process.

## 2022-03-09 NOTE — Discharge Instructions (Addendum)
AMBULATORY SURGERY  DISCHARGE INSTRUCTIONS   The drugs that you were given will stay in your system until tomorrow so for the next 24 hours you should not:  Drive an automobile Make any legal decisions Drink any alcoholic beverage   You may resume regular meals tomorrow.  Today it is better to start with liquids and gradually work up to solid foods.  You may eat anything you prefer, but it is better to start with liquids, then soup and crackers, and gradually work up to solid foods.   Please notify your doctor immediately if you have any unusual bleeding, trouble breathing, redness and pain at the surgery site, drainage, fever, or pain not relieved by medication.     Your post-operative visit with Edison Simon, PA-C                                 is: Date: March 22, 2022                Time:  2:00pm  Please call to schedule your post-operative visit.  Additional Instructions:

## 2022-03-09 NOTE — Anesthesia Preprocedure Evaluation (Signed)
Anesthesia Evaluation  Patient identified by MRN, date of birth, ID band Patient awake  General Assessment Comment:  Listed hx of PONV , but she says it was during C-sections only.  Reviewed: Allergy & Precautions, NPO status , Patient's Chart, lab work & pertinent test results  History of Anesthesia Complications Negative for: history of anesthetic complications  Airway Mallampati: II  TM Distance: >3 FB Neck ROM: Full    Dental  (+) Teeth Intact, Chipped Tooth chipped at prior anesthetic but she is unsure which:   Pulmonary neg pulmonary ROS, neg sleep apnea, neg COPD, Patient abstained from smoking.Not current smoker,  Hx of tracheal stenosis requiring tracheal dilation x 2. Previous anesthetic record for this was in 2021 with jet ventilation, required brief intubation with 6.0 ETT for adequate oxygenation.  Patient has felt well from respiratory standpoint since then.   Pulmonary exam normal breath sounds clear to auscultation       Cardiovascular Exercise Tolerance: Good METShypertension, Pt. on medications (-) CAD and (-) Past MI (-) dysrhythmias  Rhythm:Regular Rate:Normal - Systolic murmurs    Neuro/Psych  Headaches, negative psych ROS   GI/Hepatic GERD  Medicated and Controlled,(+)     (-) substance abuse  ,   Endo/Other  diabetes, Well Controlled  Renal/GU negative Renal ROS     Musculoskeletal  (+) Arthritis ,   Abdominal   Peds  Hematology   Anesthesia Other Findings Past Medical History: No date: Bronchitis     Comment:  hx of 03/03/2020: Cervical radiculitis No date: Complication of anesthesia 10/2020: COVID-19 02/28/2020: DDD (degenerative disc disease), cervical No date: Dysrhythmia     Comment:  "does not see cardiologist" No date: Family history of anesthesia complication     Comment:  "morphine night terrors" No date: GERD (gastroesophageal reflux disease) No date: Headache(784.0)      Comment:  "migraines" No date: Hypertension 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 No date: PONV (postoperative nausea and vomiting) 07/14/2014: Pre-syncope No date: Shortness of breath     Comment:  "wheezing" 03/03/2020: Weakness of left arm  Reproductive/Obstetrics                             Anesthesia Physical Anesthesia Plan  ASA: 2  Anesthesia Plan: General   Post-op Pain Management: Tylenol PO (pre-op)*, Gabapentin PO (pre-op)* and Celebrex PO (pre-op)*   Induction: Intravenous  PONV Risk Score and Plan: 4 or greater and Ondansetron, Dexamethasone and Midazolam  Airway Management Planned: Oral ETT  Additional Equipment: None  Intra-op Plan:   Post-operative Plan: Extubation in OR  Informed Consent: I have reviewed the patients History and Physical, chart, labs and discussed the procedure including the risks, benefits and alternatives for the proposed anesthesia with the patient or authorized representative who has indicated his/her understanding and acceptance.     Dental advisory given  Plan Discussed with: CRNA and Surgeon  Anesthesia Plan Comments: (Discussed risks of anesthesia with patient, including PONV, sore throat, lip/dental/eye damage. Rare risks discussed as well, such as cardiorespiratory and neurological sequelae, and allergic reactions. Discussed the role of CRNA in patient's perioperative care. Patient understands.)        Anesthesia Quick Evaluation

## 2022-03-10 ENCOUNTER — Telehealth: Payer: Self-pay | Admitting: *Deleted

## 2022-03-10 LAB — SURGICAL PATHOLOGY

## 2022-03-10 NOTE — Telephone Encounter (Signed)
Faxed FMLA to [employer] at [phone] 

## 2022-03-15 ENCOUNTER — Other Ambulatory Visit: Payer: Self-pay

## 2022-03-18 ENCOUNTER — Encounter: Payer: Self-pay | Admitting: Obstetrics and Gynecology

## 2022-03-22 ENCOUNTER — Encounter: Payer: Self-pay | Admitting: Physician Assistant

## 2022-03-22 ENCOUNTER — Other Ambulatory Visit: Payer: Self-pay

## 2022-03-22 ENCOUNTER — Ambulatory Visit (INDEPENDENT_AMBULATORY_CARE_PROVIDER_SITE_OTHER): Payer: No Typology Code available for payment source | Admitting: Physician Assistant

## 2022-03-22 VITALS — BP 112/72 | HR 85 | Temp 97.9°F | Ht 62.0 in | Wt 221.0 lb

## 2022-03-22 DIAGNOSIS — Z09 Encounter for follow-up examination after completed treatment for conditions other than malignant neoplasm: Secondary | ICD-10-CM

## 2022-03-22 DIAGNOSIS — K8010 Calculus of gallbladder with chronic cholecystitis without obstruction: Secondary | ICD-10-CM

## 2022-03-22 NOTE — Progress Notes (Signed)
[facility] POST-OP OFFICE VISIT  03/22/2022  HPI: Lisa [name] is a 46 y.o. female 13 days s/p robotic assisted laparoscopic cholecystectomy for [facility] with Dr Christian Mate   She is doing reasonably well Initially had minimal pain but on Sunday had a worsening of incisional pain at her umbilicus, worse with movement. No bulging.  No fever, chills, nausea, emesis, or urinary changes Incisions are healing well She does have an abdominal rash as well; tried 25 mg benadryl x1 without improvement   Vital signs: BP 112/72   Pulse 85   Temp 97.9 F (36.6 C) (Oral)   Ht 5\' 2"  (1.575 m)   Wt 221 lb (100.2 kg)   SpO2 99%   BMI 40.42 kg/m    Physical Exam: Constitutional: Well appearing female, NAD Abdomen: Soft, umbilical soreness, non-distended, no rebound/guarding Skin: Laparoscopic incisions are healing well, no erythema or drainage. Ecchymosis around umbilicus. She does appear to have a rash to her central abdomen in pattern of surgical prep, no rash elsewhere   Assessment/Plan: This is a 46 y.o. female 13 days s/p robotic assisted laparoscopic cholecystectomy for [place] with Dr Christian Mate    - Benadryl 25-50 mg q^ PRN + 20 mg Pepcid BID for rash  - Pain control prn  - Reviewed wound care recommendation  - Reviewed lifting restrictions; 4 weeks total  - Reviewed surgical pathology; [place]  - She can follow up on as needed basis; She understands to call with questions/concerns  -- Edison Simon, PA-C [facility] 03/22/2022, 2:07 PM M-F: 7am - 4pm

## 2022-03-22 NOTE — Patient Instructions (Signed)

## 2022-03-23 ENCOUNTER — Ambulatory Visit: Payer: No Typology Code available for payment source | Admitting: Obstetrics and Gynecology

## 2022-03-28 ENCOUNTER — Telehealth: Payer: Self-pay | Admitting: *Deleted

## 2022-03-28 NOTE — Telephone Encounter (Signed)
Patient had gallbladder surgery on 03/09/22 Dr Claudine Mouton and she would like to come pick up a letter staying that she can return to work on 03/31/22 with restrictions. Please call and advise

## 2022-03-28 NOTE — Telephone Encounter (Signed)
Called and let patient know her work note was ready for pick up at the front desk.

## 2022-04-04 ENCOUNTER — Telehealth: Payer: Self-pay

## 2022-04-04 ENCOUNTER — Ambulatory Visit (INDEPENDENT_AMBULATORY_CARE_PROVIDER_SITE_OTHER): Payer: No Typology Code available for payment source | Admitting: Internal Medicine

## 2022-04-04 ENCOUNTER — Other Ambulatory Visit: Payer: Self-pay

## 2022-04-04 ENCOUNTER — Encounter: Payer: Self-pay | Admitting: Internal Medicine

## 2022-04-04 VITALS — BP 112/78 | HR 61 | Temp 97.7°F | Ht 62.0 in | Wt 224.0 lb

## 2022-04-04 DIAGNOSIS — J42 Unspecified chronic bronchitis: Secondary | ICD-10-CM | POA: Insufficient documentation

## 2022-04-04 DIAGNOSIS — J4530 Mild persistent asthma, uncomplicated: Secondary | ICD-10-CM | POA: Insufficient documentation

## 2022-04-04 DIAGNOSIS — E119 Type 2 diabetes mellitus without complications: Secondary | ICD-10-CM

## 2022-04-04 DIAGNOSIS — N921 Excessive and frequent menstruation with irregular cycle: Secondary | ICD-10-CM

## 2022-04-04 DIAGNOSIS — K8010 Calculus of gallbladder with chronic cholecystitis without obstruction: Secondary | ICD-10-CM

## 2022-04-04 DIAGNOSIS — K219 Gastro-esophageal reflux disease without esophagitis: Secondary | ICD-10-CM | POA: Insufficient documentation

## 2022-04-04 MED ORDER — VALACYCLOVIR HCL 1 G PO TABS
1000.0000 mg | ORAL_TABLET | Freq: Two times a day (BID) | ORAL | 4 refills | Status: AC
  Filled 2022-04-04: qty 20, 10d supply, fill #0

## 2022-04-04 MED ORDER — SEMAGLUTIDE-WEIGHT MANAGEMENT 1.7 MG/0.75ML ~~LOC~~ SOAJ
1.7000 mg | SUBCUTANEOUS | 0 refills | Status: DC
  Filled 2022-04-04: qty 3, 28d supply, fill #0

## 2022-04-04 NOTE — Progress Notes (Unsigned)
Subjective:  Patient ID: Lisa [name], female    DOB: [birth year]  Age: 46 y.o. MRN: [id number]  CC: There were no encounter diagnoses.   HPI SHARLISA HOLLIFIELD presents for  Chief Complaint  Patient presents with   Follow-up    Discuss restarting [facility] after having gallbladder surgery.    46 yr old female with history of morbid obesity achieved significant weight loss on  Wegovy ( 75 lbs) developed acalculous cholecystitis mid Octobers  stopped the medication.  LIPASE NORMAL.  S/p lap chole on octo 25   Lab Results  Component Value Date   ALT 47 (H) 02/24/2022   AST 74 (H) 02/24/2022   ALKPHOS 69 02/24/2022   BILITOT 1.0 02/24/2022     Lab Results  Component Value Date   LIPASE 44 02/24/2022      Outpatient Medications Prior to Visit  Medication Sig Dispense Refill   acetaminophen (TYLENOL) 500 MG tablet Take 1,000 mg by mouth every 6 (six) hours as needed.     celecoxib (CELEBREX) 200 MG capsule Take 1 capsule (200 mg total) by mouth daily. 30 capsule 0   cetirizine (ZYRTEC) 10 MG tablet Take 10 mg by mouth daily.     cholecalciferol (VITAMIN D3) 25 MCG (1000 UNIT) tablet Take 1,000 Units by mouth daily.     cyclobenzaprine (FLEXERIL) 10 MG tablet Take 1 tablet (10 mg total) by mouth 3 (three) times daily as needed for muscle spasms. 90 tablet 1   etonogestrel (NEXPLANON) 68 MG IMPL implant 1 each by Subdermal route once.     fluticasone (FLONASE) 50 MCG/ACT nasal spray PLACE 2 SPRAYS INTO BOTH NOSTRILS DAILY. 48 g 1   Fluticasone Furoate (ARNUITY ELLIPTA) 100 MCG/ACT AEPB Inhale 1 puff into the lungs daily. 30 each 3   folic acid (FOLVITE) 1 MG tablet Take 1 tablet (1 mg total) by mouth daily. 90 tablet 1   ibuprofen (ADVIL) 800 MG tablet Take 1 tablet (800 mg total) by mouth every 8 (eight) hours as needed. 30 tablet 0   Insulin Pen Needle (PEN NEEDLES) 32G X 4 MM MISC Use weekly with ozempic pen 100 each 0   magnesium citrate SOLN Take 1 Bottle by mouth once.      metFORMIN (GLUCOPHAGE-XR) 500 MG 24 hr tablet Take 1 tablet (500 mg total) by mouth daily with breakfast. 90 tablet 1   methylPREDNISolone (MEDROL DOSEPAK) 4 MG TBPK tablet USE AS DIRECTED 21 tablet 0   Misc Natural Products (TART CHERRY ADVANCED PO) Take 1 capsule by mouth daily.     omeprazole (PRILOSEC) 40 MG capsule TAKE 1 CAPSULE BY MOUTH DAILY. 90 capsule 1   penciclovir (DENAVIR) 1 % cream APPLY TOPICALLY EVERY TWO HOURS FOR 4 DAYS AS NEEDED FOR COLD SORES 5 g 0   Turmeric 400 MG CAPS Take 400 mg by mouth daily.      No facility-administered medications prior to visit.    Review of Systems;  Patient denies headache, fevers, malaise, unintentional weight loss, skin rash, eye pain, sinus congestion and sinus pain, sore throat, dysphagia,  hemoptysis , cough, dyspnea, wheezing, chest pain, palpitations, orthopnea, edema, abdominal pain, nausea, melena, diarrhea, constipation, flank pain, dysuria, hematuria, urinary  Frequency, nocturia, numbness, tingling, seizures,  Focal weakness, Loss of consciousness,  Tremor, insomnia, depression, anxiety, and suicidal ideation.      Objective:  BP 112/78 (BP Location: Left Arm, Patient Position: Sitting, Cuff Size: Large)   Pulse 61   Temp 97.7 F (  36.5 C) (Oral)   Ht 5\' 2"  (1.575 m)   Wt 224 lb (101.6 kg)   SpO2 99%   BMI 40.97 kg/m   BP Readings from Last 3 Encounters:  04/04/22 112/78  03/22/22 112/72  03/09/22 132/80    Wt Readings from Last 3 Encounters:  04/04/22 224 lb (101.6 kg)  03/22/22 221 lb (100.2 kg)  03/03/22 216 lb (98 kg)    General appearance: alert, cooperative and appears stated age Ears: normal TM's and external ear canals both ears Throat: lips, mucosa, and tongue normal; teeth and gums normal Neck: no adenopathy, no carotid bruit, supple, symmetrical, trachea midline and thyroid not enlarged, symmetric, no tenderness/mass/nodules Back: symmetric, no curvature. ROM normal. No CVA tenderness. Lungs: clear  to auscultation bilaterally Heart: regular rate and rhythm, S1, S2 normal, no murmur, click, rub or gallop Abdomen: soft, non-tender; bowel sounds normal; no masses,  no organomegaly Pulses: 2+ and symmetric Skin: Skin color, texture, turgor normal. No rashes or lesions Lymph nodes: Cervical, supraclavicular, and axillary nodes normal. Neuro:  awake and interactive with normal mood and affect. Higher cortical functions are normal. Speech is clear without word-finding difficulty or dysarthria. Extraocular movements are intact. Visual fields of both eyes are grossly intact. Sensation to light touch is grossly intact bilaterally of upper and lower extremities. Motor examination shows 4+/5 symmetric hand grip and upper extremity and 5/5 lower extremity strength. There is no pronation or drift. Gait is non-ataxic   Lab Results  Component Value Date   HGBA1C 5.6 02/24/2022   HGBA1C 5.7 09/23/2021   HGBA1C 6.2 04/13/2021    Lab Results  Component Value Date   CREATININE 0.66 02/24/2022   CREATININE 0.73 02/24/2022   CREATININE 0.74 11/15/2021    Lab Results  Component Value Date   WBC 14.4 (H) 02/24/2022   HGB 14.2 02/24/2022   HCT 43.7 02/24/2022   PLT 258 02/24/2022   GLUCOSE 127 (H) 02/24/2022   CHOL 192 02/24/2022   TRIG 242.0 (H) 02/24/2022   HDL 42.80 02/24/2022   LDLDIRECT 115.0 02/24/2022   LDLCALC 107 (H) 07/08/2020   ALT 47 (H) 02/24/2022   AST 74 (H) 02/24/2022   NA 136 02/24/2022   K 3.7 02/24/2022   CL 108 02/24/2022   CREATININE 0.66 02/24/2022   BUN 18 02/24/2022   CO2 21 (L) 02/24/2022   TSH 2.55 09/23/2021   HGBA1C 5.6 02/24/2022   MICROALBUR <0.7 06/17/2021    No results found.  Assessment & Plan:   Problem List Items Addressed This Visit   None   I spent a total of   minutes with this patient in a face to face visit on the date of this encounter reviewing the last office visit with me in       ,  most recent visit with cardiology ,    ,  patient's  diet and exercise habits, home blood pressure /blod sugar readings, recent ER visit including labs and imaging studies ,   and post visit ordering of testing and therapeutics.    Follow-up: No follow-ups on file.   08/15/2021, MD

## 2022-04-04 NOTE — Patient Instructions (Signed)
We are going to use Ozempic samples to get you up to 1.7 mg dose of wegovy (same medication , different names)  2 weeks at 0.25 mg dose  If tolerating ,  move to 0.50 mg for dose 3 and 4  if tolerating, move to 1.0 mg for doses  5  and 6   Fill the 1.7 mg wegovy which   will be be the next dose    Call if you are needing to advance more slowly and I will try to get another sample so you can stay at the 1.0 mg dose longer (or the 0.5 mg dose)

## 2022-04-04 NOTE — Telephone Encounter (Signed)
Medication Samples have been provided to the patient.  Drug name: Ozempic       Strength: 0.25 mg        Qty: 2 boxes  LOT: [id number]  Exp.Date: 11/13/2023  Dosing instructions: inject 0.25 mg into skin once weekly.   The patient has been instructed regarding the correct time, dose, and frequency of taking this medication, including desired effects and most common side effects.   Danai Gotto 4:46 PM 04/04/2022

## 2022-04-04 NOTE — Assessment & Plan Note (Addendum)
-   Treated for bronchitis in June with Z-Pak and prednisone without relief. During her last office visit with Dr. Jayme Cloud on 11/10/2021 she was provided Medrol Dosepak.  - Cough is significantly better, she has a residual dry cough which is not bothersome to her. Reflux also felt to be contributing to cough.  - Continue Arnuity one puff daily in the morning. She can trial off inhaler for 1 week and if cough worsens recommend resuming daily use

## 2022-04-04 NOTE — Assessment & Plan Note (Signed)
-   Continue GERD medication

## 2022-04-05 ENCOUNTER — Ambulatory Visit: Payer: No Typology Code available for payment source

## 2022-04-05 LAB — TSH: TSH: 2.03 u[IU]/mL (ref 0.35–5.50)

## 2022-04-05 LAB — COMPREHENSIVE METABOLIC PANEL
ALT: 34 U/L (ref 0–35)
AST: 21 U/L (ref 0–37)
Albumin: 4.4 g/dL (ref 3.5–5.2)
Alkaline Phosphatase: 75 U/L (ref 39–117)
BUN: 15 mg/dL (ref 6–23)
CO2: 26 mEq/L (ref 19–32)
Calcium: 9.1 mg/dL (ref 8.4–10.5)
Chloride: 105 mEq/L (ref 96–112)
Creatinine, Ser: 0.68 mg/dL (ref 0.40–1.20)
GFR: 105.31 mL/min (ref >60.00)
Glucose, Bld: 91 mg/dL (ref 70–99)
Potassium: 3.9 mEq/L (ref 3.5–5.1)
Sodium: 139 mEq/L (ref 135–145)
Total Bilirubin: 0.5 mg/dL (ref 0.2–1.2)
Total Protein: 6.8 g/dL (ref 6.0–8.3)

## 2022-04-05 LAB — LIPASE: Lipase: 50 U/L (ref 11.0–59.0)

## 2022-04-05 NOTE — Assessment & Plan Note (Signed)
Lap chole mid October.

## 2022-04-05 NOTE — Assessment & Plan Note (Signed)
She has normalized her A1c with GLP 1 agnonist aided weight loss.  She was taking 2.4 mg semaglutide prior to suspension due to acalculous cholecystitis requiring lap chole in mid October  Lab Results  Component Value Date   HGBA1C 5.6 02/24/2022

## 2022-04-05 NOTE — Assessment & Plan Note (Signed)
Rechecking lipase and liver enzymes today , if normal she has been advised to  resume weight loss management with ozempic  samples using an accelerated titration schedule  of 2 weeks at 0.25 mg . 3  weeks at 0.5 mg weekly., and   2 weeks at 1.0 mg weekly (assuming toleration of accelerated schedule) using samples given to her in office. Rx for  Wegovy 1.7 mg dose sent to her pharmacy  Lab Results  Component Value Date   LIPASE 44 02/24/2022   Lab Results  Component Value Date   ALT 47 (H) 02/24/2022   AST 74 (H) 02/24/2022   ALKPHOS 69 02/24/2022   BILITOT 1.0 02/24/2022

## 2022-04-06 ENCOUNTER — Other Ambulatory Visit: Payer: Self-pay

## 2022-04-12 ENCOUNTER — Ambulatory Visit: Payer: No Typology Code available for payment source

## 2022-04-12 NOTE — Progress Notes (Unsigned)
      GYNECOLOGY OFFICE PROCEDURE NOTE  Lisa [name] is a 46 y.o. female here for Nexplanon removal and Nexplanon insertion.  Last pap smear was on 06/21/2021 and was normal.  No other gynecologic concerns.  Nexplanon Removal and Reinsertion Patient identified, informed consent performed, consent signed.   Patient does understand that irregular bleeding is a very common side effect of this medication. She was advised to have backup contraception for one week after replacement of the implant. Pregnancy test in clinic today was negative.  Appropriate time out taken. Nexplanon site identified in left arm.  Area prepped in usual sterile fashon. Three ml of 1% lidocaine was used to anesthetize the area at the distal end of the implant. A small stab incision was made right beside the implant on the distal portion. The Nexplanon rod was grasped using hemostats and removed without difficulty. There was minimal blood loss. There were no complications.  Nexplanon removed from packaging, Device confirmed in needle, then inserted full length of needle and withdrawn per handbook instructions. Nexplanon was able to palpated in the patient's arm; patient palpated the insert herself.  There was minimal blood loss. Patient insertion site covered with gauze and a pressure bandage to reduce any bruising. The patient tolerated the procedure well and was given post procedure instructions.  She was advised to have backup contraception for one week.     Lot: [id number] Exp: 10/13/2023  Hildred Laser, MD [facility]

## 2022-04-12 NOTE — Patient Instructions (Incomplete)
Nexplanon Instructions After Insertion  Keep bandage clean and dry for 24 hours  May use ice/Tylenol/Ibuprofen for soreness or pain  If you develop fever, drainage or increased warmth from incision site-contact office immediately   

## 2022-04-13 ENCOUNTER — Ambulatory Visit (INDEPENDENT_AMBULATORY_CARE_PROVIDER_SITE_OTHER): Payer: No Typology Code available for payment source | Admitting: Obstetrics and Gynecology

## 2022-04-13 ENCOUNTER — Encounter: Payer: Self-pay | Admitting: Obstetrics and Gynecology

## 2022-04-13 VITALS — BP 107/68 | HR 71 | Resp 16 | Ht 62.0 in | Wt 226.1 lb

## 2022-04-13 DIAGNOSIS — Z30017 Encounter for initial prescription of implantable subdermal contraceptive: Secondary | ICD-10-CM

## 2022-04-13 DIAGNOSIS — Z3046 Encounter for surveillance of implantable subdermal contraceptive: Secondary | ICD-10-CM | POA: Diagnosis not present

## 2022-04-13 MED ORDER — ETONOGESTREL 68 MG ~~LOC~~ IMPL
68.0000 mg | DRUG_IMPLANT | Freq: Once | SUBCUTANEOUS | Status: AC
  Administered 2022-04-13: 68 mg via SUBCUTANEOUS

## 2022-04-14 ENCOUNTER — Encounter: Payer: Self-pay | Admitting: Obstetrics and Gynecology

## 2022-04-19 ENCOUNTER — Ambulatory Visit: Payer: No Typology Code available for payment source

## 2022-04-25 NOTE — Therapy (Signed)
OUTPATIENT PHYSICAL THERAPY TREATMENT/RECERT/DISCHARGE   Patient Name: Lisa [name] MRN: [id number] DOB:04/16/1976, 45 y.o., female Today's Date: 04/26/2022   PT End of Session - 04/26/22 0726     Visit Number 11    Number of Visits 24    Date for PT Re-Evaluation 04/26/22    Authorization Type 10/10 eval 11/24/21; next session 1/10 PN 9/27    Activity Tolerance Patient tolerated treatment well;Patient limited by pain    Behavior During Therapy [facility] for tasks assessed/performed                      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   PONV (postoperative nausea and vomiting)    Pre-syncope 07/14/2014   Shortness of breath    "wheezing"   Weakness of left arm 03/03/2020   Past Surgical History:  Procedure Laterality Date   CESAREAN SECTION     x 2   CHOLECYSTECTOMY  03/09/2022   MICROLARYNGOSCOPY WITH DILATION N/A 03/13/2020   Procedure: MICROLARYNGOSCOPY WITH DILATION w/JET VENT MITOMYCIN C;  Surgeon: Christia Reading, MD;  Location: [facility] OR;  Service: ENT;  Laterality: N/A;   MICROLARYNGOSCOPY WITH LASER AND BALLOON DILATION N/A 07/20/2012   Procedure: MICROLARYNGOSCOPY WITH LASER AND BALLOON DILATION;  Surgeon: Christia Reading, MD;  Location: [facility] OR;  Service: ENT;  Laterality: N/A;  WITH JET VENTURI VENTILATION   TONGUE FLAP RELEASE     widom extractions     Patient Active Problem List   Diagnosis Date Noted   Chronic bronchitis (HCC) 04/04/2022   GERD (gastroesophageal reflux disease) 04/04/2022    Complication of anesthesia 03/08/2022   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   Family history of colon cancer requiring screening colonoscopy 02/24/2022   Trigger point of left shoulder region 10/06/2021   Cervical radiculitis 03/03/2020   Weakness of left arm 03/03/2020   Cervicalgia 02/28/2020   Spondylosis, cervical, 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   Loud snoring 08/01/2019   B12 deficiency 08/01/2019   Eczema of left external ear 08/01/2019   Hyperlipidemia associated with type 2 diabetes mellitus (HCC) 07/31/2019   Nonallopathic lesion of cervical region 04/29/2019   Nonallopathic lesion of thoracic region 04/29/2019   Nonallopathic lesion of rib cage 04/29/2019   Cervical radiculopathy at C7 03/26/2019   Allergic rhinitis due to pollen 08/18/2018   Type 2 diabetes mellitus without complications (HCC) 05/02/2017   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   Encounter for general adult medical examination with abnormal findings 04/29/2016   Low back pain without sciatica 04/28/2015   Morbid obesity (HCC) 04/28/2015    PCP: Sherlene Shams MD  REFERRING PROVIDER: [name] DO  REFERRING DIAG: S28.76 (ICD-10-CM) - Cervical radiculitis M25.512 (ICD-10-CM) - Trigger point of left shoulder region   THERAPY DIAG:  Cervicalgia  Pain in thoracic spine  Radiculopathy, cervical region  Nonintractable headache, unspecified chronicity pattern, unspecified headache type  Rationale for Evaluation and Treatment Rehabilitation  ONSET DATE: New onset May 2023.   SUBJECTIVE:  SUBJECTIVE STATEMENT: Patient ha not been seen since September. Has had gallbladder surgery and a sprained ankle. Went back to work the Thursday before thanksgiving, 6 week restrictions are up last week so back to  100% now this week. When started back to work had a headache and rib pain returned. Reports the pain is much better than it was at the beginning of therapy.   PERTINENT HISTORY:  Patient is a 46 year old female who presents with cervical radiculitis and left shoulder trigger points. PMH includes. DDD, cervical radiculopathy, dysrythmia, GERD, HA, HTN, subglottic tracheal stenosis, PONV, DM. Patient's pain began around three years ago, had a flare up two years ago that resulted in a lot of nerve pain in L arm. Had an MRI which showed stenosis; went to pain management and saw Maralyn Sago at [facility], got better. Had a flare up about 8 weeks ago which now is down scapula and around rib. 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   PAIN:  Are you having pain? 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 Reviewed precautions and risks with patient. Reviewed special precautions/risks over lung fields which include pneumothorax. Reviewed signs and symptoms of pneumothorax and advised pt to go to ER immediately if these symptoms develop advise them of dry needling treatment. Extensive time spent with pt to ensure full understanding of TDN risks. Pt provided verbal consent to treatment. TDN performed to  with 0.25 x 40 single needle placements with local twitch response (LTR). Pistoning technique utilized. Improved pain-free motion following  intervention. Muscles targeted: L and R upper trap, L subscap,,x 20 minutes    PATIENT EDUCATION:  Education details: goals, POC, HEP  Person educated: Patient Education method: Explanation, Demonstration, Tactile cues, and Verbal cues Education comprehension: verbalized understanding, returned demonstration, verbal cues required, and tactile cues required   HOME EXERCISE PROGRAM: Access Code: [email or web address] URL: [email or web address] Date: 01/31/2022 Prepared by: Alvera Novel  Exercises - Standing Row with Resistance  - 1 x daily - 7 x weekly - 2 sets - 15 reps - Shoulder External Rotation and Scapular Retraction with Resistance  - 1 x daily - 7 x weekly - 2 sets - 10 reps - Sidelying Thoracic Rotation Book Openings- Both sides  - 1 x daily - 7 x weekly - 1 sets - 15 reps - Supine Thoracic Mobilization Foam Roll Horizontal  - 1 x daily - 7 x weekly - 1 sets - 1 reps - hold - Supine Shoulder Flexion with Dowel to 90  - 1 x daily - 7 x weekly - 1 sets - 20 reps  ASSESSMENT:  CLINICAL IMPRESSION: Patient has not been seen since September. Patient agreeable to discharge due to limited attendance. We will be happy to see the patient again as needed in the future.   OBJECTIVE IMPAIRMENTS decreased mobility, decreased ROM, decreased strength, hypomobility, impaired perceived functional ability, impaired flexibility, impaired UE functional use, improper body mechanics, postural dysfunction, and  pain.   ACTIVITY LIMITATIONS carrying, lifting, bending, dressing, reach over head, hygiene/grooming, and caring for others  PARTICIPATION LIMITATIONS: meal prep, cleaning, laundry, interpersonal relationship, driving, shopping, community activity, occupation, and yard work  PERSONAL FACTORS Age, Fitness, Past/current experiences, Profession, Sex, Time since onset of injury/illness/exacerbation, and 3+ comorbidities: DDD, cervical radiculopathy, dysrythmia, GERD, HA, HTN, subglottic  tracheal stenosis, PONV, DM  are also affecting patient's functional outcome.   REHAB POTENTIAL: Good  CLINICAL DECISION MAKING: Evolving/moderate complexity  EVALUATION COMPLEXITY: Moderate   GOALS: Goals reviewed with patient? Yes  SHORT TERM GOALS: Target date: 12/28/2021   Patient will be independent in home exercise program to improve strength/mobility for better functional independence with ADLs. Baseline: 7/12; HEP given 12/12 HEP compliant  Goal status: INITIAL  2.  Patient will report a worst pain of 4/10 on VAS in  shoulder/neck/rib  to improve tolerance with ADLs and reduced symptoms with activities.  Baseline: 7/12: 5/10 9/13: 5/10 rib pain 2/10 12/12: 4/10  Goal status: IN PROGRESS  LONG TERM GOALS: Target date: 04/26/22     Patient will increase FOTO score to equal to or greater than  70%   to demonstrate statistically significant improvement in mobility and quality of life.  Baseline: 7/12: 63% 9/13: 68% 12/12: 69% Goal status: IN PROGRESS  2.  Patient will report a worst pain of 2/10 on VAS in neck, back, rib to improve tolerance with ADLs and reduced symptoms with activities.  Baseline: 7/12: 5/10 9/13: 5/10 ; rib pain 2/10 12/12: 4/10  Goal status: IN PROGRESS  3.  Patient will reduce Neck Disability Index score to <10% to demonstrate minimal disability with ADL's including improved sleeping tolerance, sitting tolerance, etc for better mobility at home and work. Baseline: 7/12: 34% 9/13: 22% 12/12: 18%  Goal status: IN PROGRESS  4.  Patient will be able to utilize LUE while working without increase in pain and fatigue for return to PLOF Baseline: 7/12: limited use of LUE 9/13: protective of using LUE 12/12: having to use if for the holiday's; barely started back lifting; able to use it.  Goal status: IN PROGRESS  5.  Patient will tolerate sitting unsupported demonstrating erect sitting posture with minimal thoracic kyphosis for 15+ minutes with maximum of  5/10 back pain to demonstrate improved back extensor strength and improved sitting tolerance.  Baseline: 7/12: kyphotic posture with forward head rounded shoulders 9/13: improved posture but rib pain continues to be present 12/12: mindful of posture  Goal status: IN PROGRESS     PLAN: PT FREQUENCY: 2x/week  PT DURATION: 8 weeks  PLANNED INTERVENTIONS: Therapeutic exercises, Therapeutic activity, Neuromuscular re-education, Balance training, Gait training, Patient/Family education, Joint mobilization, Joint manipulation, Dry Needling, Electrical stimulation, Spinal manipulation, Spinal mobilization, Cryotherapy, Moist heat, Taping, Traction, Ultrasound, Ionotophoresis 4mg /ml Dexamethasone, and Manual therapy  PLAN FOR NEXT SESSION: TDN to middle traps/upper traps, subscap. Posture education and performance    , Precious Bard 04/26/2022, 7:55 AM Physical Therapist - [facility]  Outpatient Physical Therapy- Main Campus [phone]

## 2022-04-26 ENCOUNTER — Ambulatory Visit: Payer: No Typology Code available for payment source | Attending: Emergency Medicine

## 2022-04-26 DIAGNOSIS — R519 Headache, unspecified: Secondary | ICD-10-CM | POA: Insufficient documentation

## 2022-04-26 DIAGNOSIS — M542 Cervicalgia: Secondary | ICD-10-CM | POA: Insufficient documentation

## 2022-04-26 DIAGNOSIS — M5412 Radiculopathy, cervical region: Secondary | ICD-10-CM | POA: Diagnosis present

## 2022-04-26 DIAGNOSIS — M546 Pain in thoracic spine: Secondary | ICD-10-CM | POA: Insufficient documentation

## 2022-04-26 NOTE — Progress Notes (Signed)
This encounter was created in error - please disregard.

## 2022-04-26 NOTE — Telephone Encounter (Signed)
MyChart messgae sent to patient. 

## 2022-05-03 ENCOUNTER — Ambulatory Visit: Payer: No Typology Code available for payment source

## 2022-05-10 ENCOUNTER — Telehealth: Payer: No Typology Code available for payment source | Admitting: Physician Assistant

## 2022-05-10 ENCOUNTER — Other Ambulatory Visit: Payer: Self-pay | Admitting: Internal Medicine

## 2022-05-10 ENCOUNTER — Other Ambulatory Visit: Payer: Self-pay

## 2022-05-10 ENCOUNTER — Ambulatory Visit: Payer: No Typology Code available for payment source

## 2022-05-10 DIAGNOSIS — B001 Herpesviral vesicular dermatitis: Secondary | ICD-10-CM

## 2022-05-10 DIAGNOSIS — T7840XA Allergy, unspecified, initial encounter: Secondary | ICD-10-CM | POA: Diagnosis not present

## 2022-05-10 MED ORDER — VALACYCLOVIR HCL 1 G PO TABS
2000.0000 mg | ORAL_TABLET | Freq: Two times a day (BID) | ORAL | 0 refills | Status: AC
  Filled 2022-05-10: qty 4, 1d supply, fill #0

## 2022-05-10 MED FILL — Omeprazole Cap Delayed Release 40 MG: ORAL | 90 days supply | Qty: 90 | Fill #0 | Status: AC

## 2022-05-10 NOTE — Progress Notes (Signed)
We are sorry that you are not feeling well.  Here is how we plan to help!  Based on what you have shared with me it does look like you have a viral infection.    Most cold sores or fever blisters are small fluid filled blisters around the mouth caused by herpes simplex virus.  The most common strain of the virus causing cold sores is herpes simplex virus 1.  It can be spread by skin contact, sharing eating utensils, or even sharing towels.  Cold sores are contagious to other people until dry. (Approximately 5-7 days).  Wash your hands. You can spread the virus to your eyes through handling your contact lenses after touching the lesions.  Most people experience pain at the sight or tingling sensations in their lips that may begin before the ulcers erupt.  Herpes simplex is treatable but not curable.  It may lie dormant for a long time and then reappear due to stress or prolonged sun exposure.  Many patients have success in treating their cold sores with an over the counter topical called Abreva.  You may apply the cream up to 5 times daily (maximum 10 days) until healing occurs.  If you would like to use an oral antiviral medication to speed the healing of your cold sore, I have sent a prescription to your local pharmacy Valacyclovir 2 gm take one by mouth twice a day for 1 day    I do also think you have increased swelling due to use of the topical essential oils. Stop these ASAP. Start OTC Benadryl every 4-6 hours for next 24-48 hours. If continuing to swell, you need an in-person evaluation.   HOME CARE:  Wash your hands frequently. Do not pick at or rub the sore. Don't open the blisters. Avoid kissing other people during this time. Avoid sharing drinking glasses, eating utensils, or razors. Do not handle contact lenses unless you have thoroughly washed your hands with soap and warm water! Avoid oral sex during this time.  Herpes from sores on your mouth can spread to your partner's genital  area. Avoid contact with anyone who has eczema or a weakened immune system. Cold sores are often triggered by exposure to intense sunlight, use a lip balm containing a sunscreen (SPF 30 or higher).  GET HELP RIGHT AWAY IF:  Blisters look infected. Blisters occur near or in the eye. Symptoms last longer than 10 days. Your symptoms become worse.  MAKE SURE YOU:  Understand these instructions. Will watch your condition. Will get help right away if you are not doing well or get worse.    Your e-visit answers were reviewed by a board certified advanced clinical practitioner to complete your personal care plan.  Depending upon the condition, your plan could have  Included both over the counter or prescription medications.    Please review your pharmacy choice.  Be sure that the pharmacy you have chosen is open so that you can pick up your prescription now.  If there is a problem you can message your provider in MyChart to have the prescription routed to another pharmacy.    Your safety is important to [place].  If you have drug allergies check our prescription carefully.  For the next 24 hours you can use MyChart to ask questions about today's visit, request a non-urgent call back, or ask for a work or school excuse from your e-visit provider.  You will get an email in the next two days asking about  your experience.  I hope that your e-visit has been valuable and will speed your recovery.

## 2022-05-10 NOTE — Progress Notes (Signed)
I have spent 5 minutes in review of e-visit questionnaire, review and updating patient chart, medical decision making and response to patient.   Chue Berkovich Cody Kameo Bains, PA-C    

## 2022-05-11 ENCOUNTER — Other Ambulatory Visit: Payer: Self-pay

## 2022-05-13 ENCOUNTER — Other Ambulatory Visit: Payer: Self-pay

## 2022-05-16 ENCOUNTER — Encounter: Payer: Self-pay | Admitting: Internal Medicine

## 2022-05-16 DIAGNOSIS — E119 Type 2 diabetes mellitus without complications: Secondary | ICD-10-CM

## 2022-05-18 ENCOUNTER — Ambulatory Visit: Payer: 59 | Attending: Emergency Medicine

## 2022-05-18 DIAGNOSIS — M542 Cervicalgia: Secondary | ICD-10-CM | POA: Insufficient documentation

## 2022-05-18 MED ORDER — SEMAGLUTIDE(0.25 OR 0.5MG/DOS) 2 MG/1.5ML ~~LOC~~ SOPN: 1.0000 mg | PEN_INJECTOR | SUBCUTANEOUS | 0 refills | Status: DC

## 2022-05-18 NOTE — Assessment & Plan Note (Signed)
Ozempic dose increased to 1.0 mg weekly using samples of the 0.25/0.5 pen . Advised to check with pharmacy on availability of 1,7 or 2.0 mg doses for next fill

## 2022-05-18 NOTE — Therapy (Signed)
PT/OT/SLP Screening Form   Time: in: 11: 16     Time out : 11:40   Complaint  L upper trap  Past Medical Hx:L upper trap strain Injury Date:Last Thursday  Pain Scale: 7/10   Hx (this occurrence):   Patient was at work Thursday and had to apply pressure with both hands. Had severe nausea, headache around eye and to occipital region, 7/10 pain. Had a massage Friday that helped resolve headache.  L shoulder rib pain and pain that radiates to arm.    Assessment: Palpation: very limited L upper trap tension  Posture: L shoulder elevation  Mobilizations: grade II: hypomobile thoracic spine and 6th rib  ROM: severely limited by pain and L upper trap tension:   Trigger Point Dry Needling (TDN), unbilled Education performed with patient regarding potential benefit of TDN. Reviewed precautions and risks with patient. Reviewed special precautions/risks over lung fields which include pneumothorax. Reviewed signs and symptoms of pneumothorax and advised pt to go to ER immediately if these symptoms develop advise them of dry needling treatment. Extensive time spent with pt to ensure full understanding of TDN risks. Pt provided verbal consent to treatment. TDN performed to  with 0.25 x 40 single needle placements with local twitch response (LTR). Pistoning technique utilized. Improved pain-free motion following intervention. L upper trap, L subscap L occipitals     Recommendations:    Comments:  Patient presents with acute onset of L shoulder/neck pain that is severely impairing her quality of life. TDN reduced upper trap tension with patient having improved ROM by end of screen. At this time majority of symptoms are resolved, patient educated on upper trap stretches to perform. No need for further therapy.   []  Patient would benefit from an MD referral []  Patient would benefit from a full PT/OT/ SLP evaluation and treatment. [x]  No intervention recommended at this time.

## 2022-05-18 NOTE — Telephone Encounter (Signed)
LMTCB to advise pt of the mychart message Dr. Derrel Nip sent her.

## 2022-05-18 NOTE — Telephone Encounter (Signed)
Medication Samples have been provided to the patient.  Drug name: Ozempic       Strength: 0.25mg  or 0.5mg         Qty: 2 boxes  LOT: [id number]  Exp.Date: 11/13/2023  Dosing instructions: Inject 2 0.5 mg dose into skin once weekly.   The patient has been instructed regarding the correct time, dose, and frequency of taking this medication, including desired effects and most common side effects.   Fin Hupp 2:23 PM 05/18/2022

## 2022-05-19 NOTE — Telephone Encounter (Signed)
Pt has picked up medication.  

## 2022-05-31 ENCOUNTER — Ambulatory Visit: Payer: No Typology Code available for payment source

## 2022-06-05 IMAGING — MR MR CERVICAL SPINE W/O CM
5 series · 40 of 48 positions shown · non-contrast
Comparison: X-ray 03/26/2019

CLINICAL DATA: Left-sided neck pain with radiculopathy for 4 weeks

EXAM:
MRI CERVICAL SPINE WITHOUT CONTRAST
TECHNIQUE: Multiplanar, multisequence MR imaging of the cervical spine was
performed. No intravenous contrast was administered.

[Series 5: T2 · sagittal · 3.0mm · 0.62mm/px · 6 of 15 slices shown (1 of 2)]
[im 1/15]
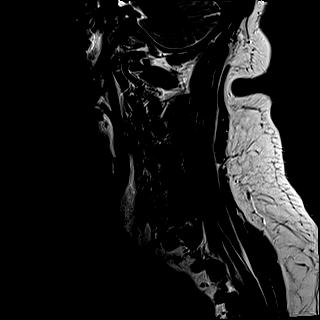
[im 3/15]
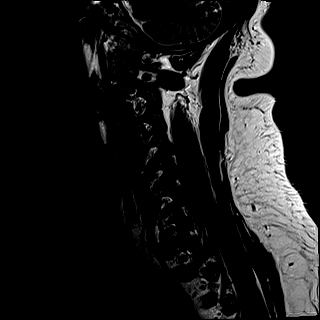
[im 6/15]
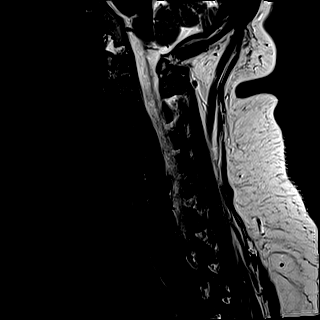
[im 9/15]
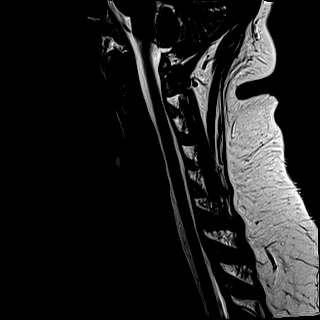
[im 12/15]
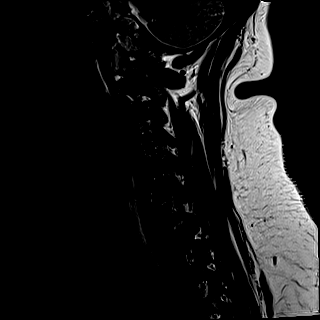
[im 15/15]
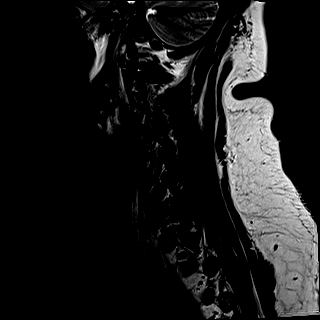

[Series 6: FLAIR · sagittal · 3.0mm · 0.78mm/px · 7 of 15 slices shown]
[im 1/15]
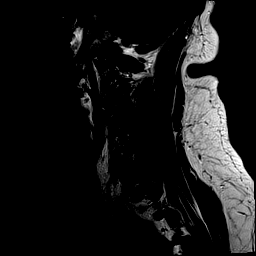
[im 3/15]
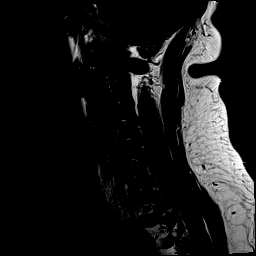
[im 5/15]
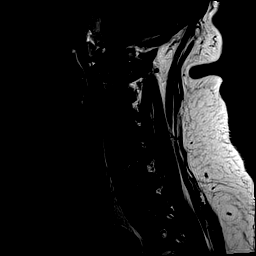
[im 8/15]
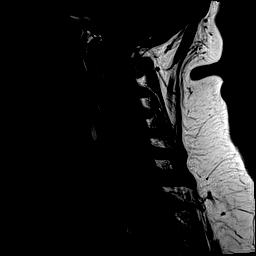
[im 10/15]
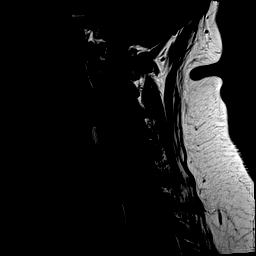
[im 12/15]
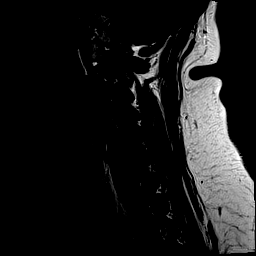
[im 15/15]
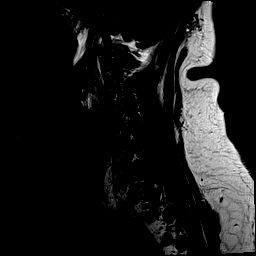

[Series 7: STIR · sagittal · 3.0mm · 0.62mm/px · 7 of 15 slices shown]
[im 1/15]
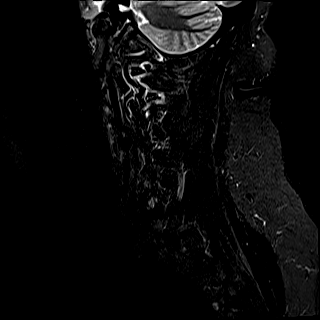
[im 3/15]
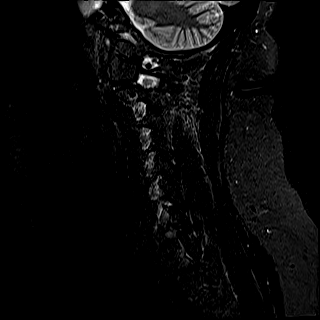
[im 5/15]
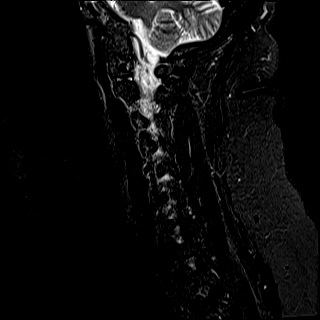
[im 8/15]
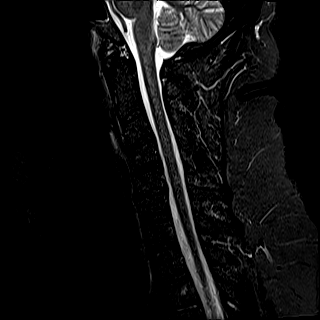
[im 10/15]
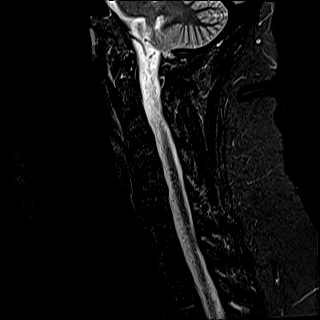
[im 12/15]
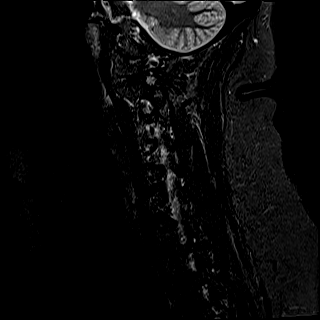
[im 15/15]
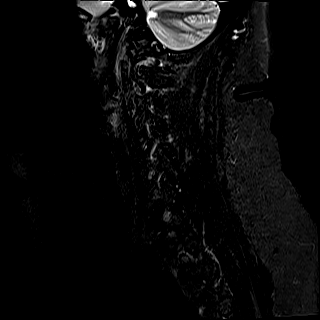

[Series 8: T2 · axial · 3.0mm · 0.70mm/px · z∈[-144,-48]mm · 12 of 29 slices shown (2 of 2)]
[im 1/29]
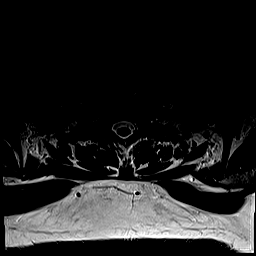
[im 3/29]
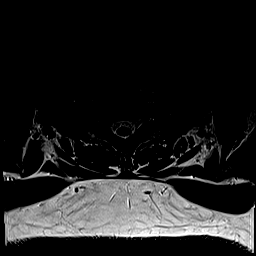
[im 5/29]
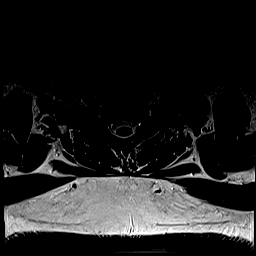
[im 7/29]
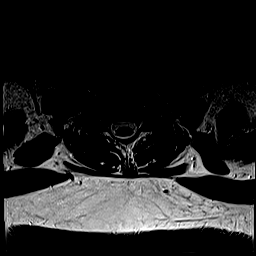
[im 9/29]
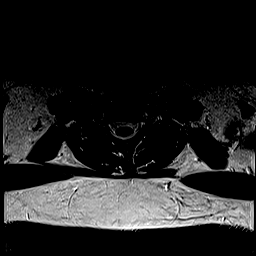
[im 11/29]
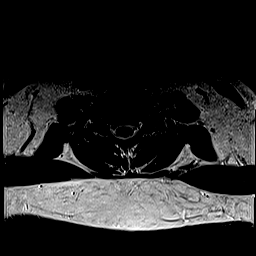
[im 13/29]
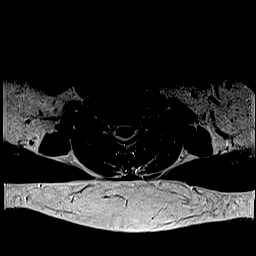
[im 16/29]
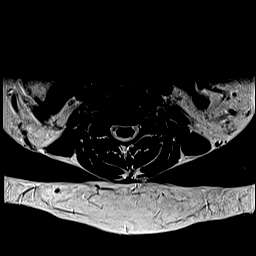
[im 18/29]
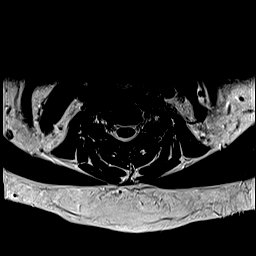
[im 20/29]
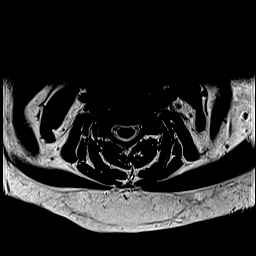
[im 24/29]
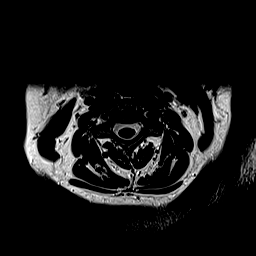
[im 29/29]
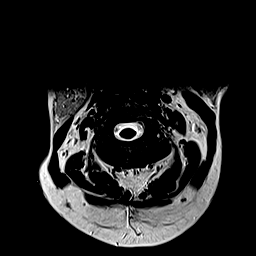

[Series 9: ax mpgr · axial · 3.0mm · 0.35mm/px · z∈[-144,-48]mm · 8 of 29 slices shown]
[im 1/29]
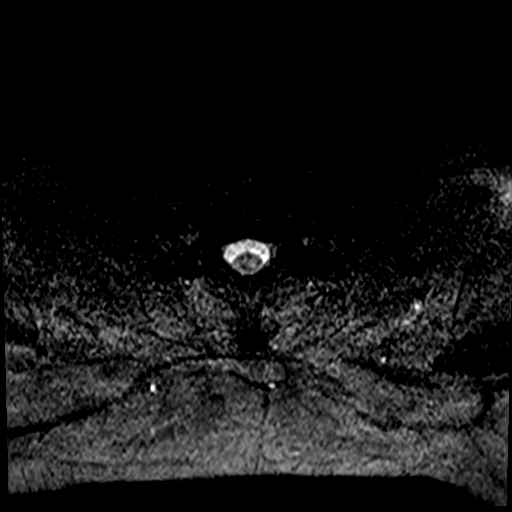
[im 5/29]
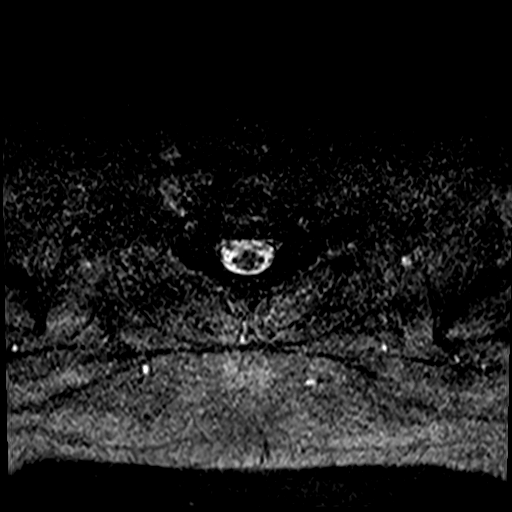
[im 9/29]
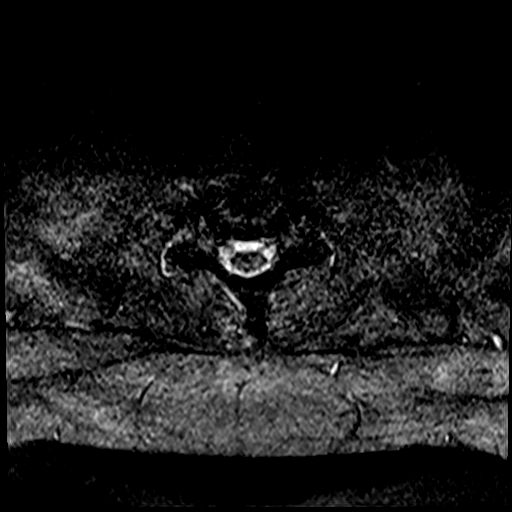
[im 13/29]
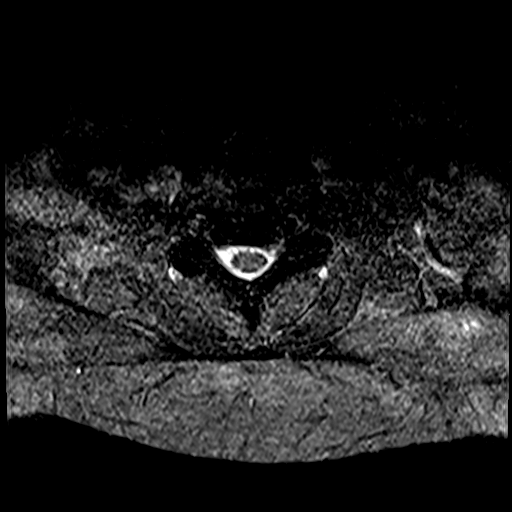
[im 16/29]
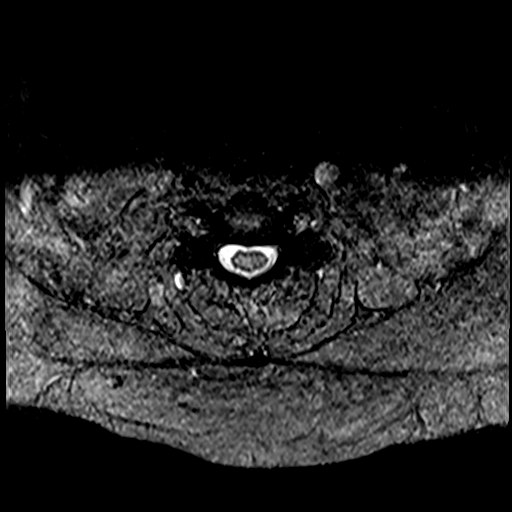
[im 20/29]
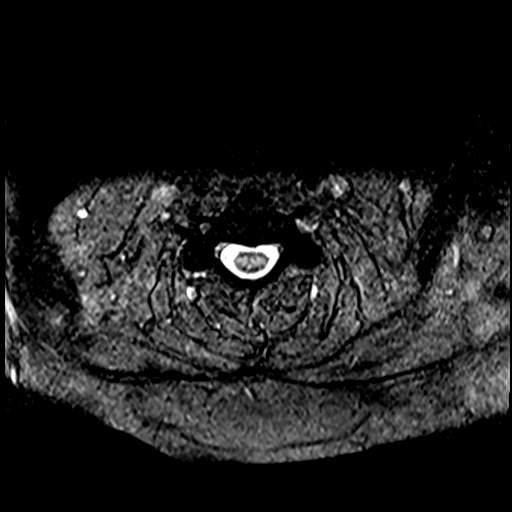
[im 24/29]
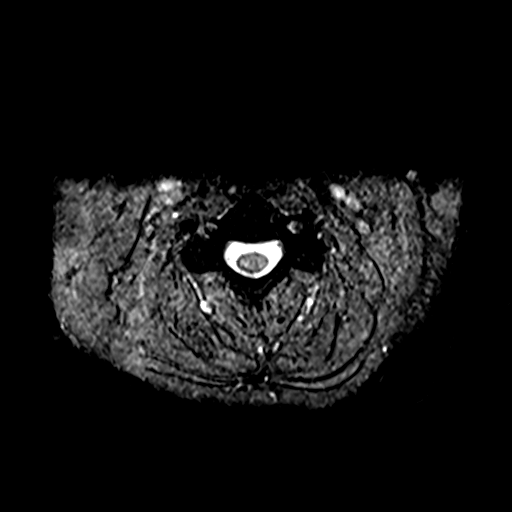
[im 29/29]
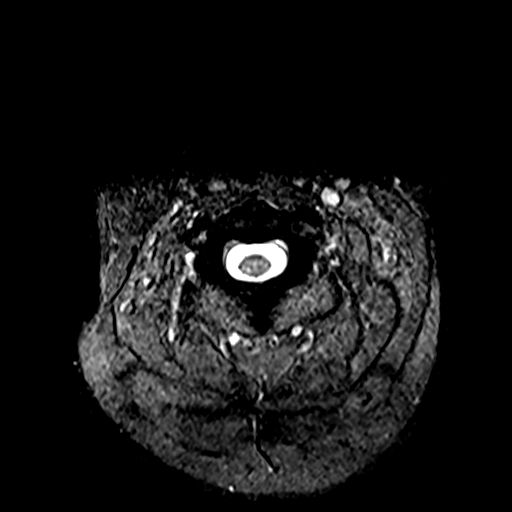

[40 of 48 positions shown; findings below may reference images not displayed]

FINDINGS: Alignment: Straightening of the cervical lordosis without static
listhesis.

Vertebrae: No fracture, evidence of discitis, or bone lesion.

Cord: Normal signal and morphology.

Posterior Fossa, vertebral arteries, paraspinal tissues: Negative.

Disc levels:

C2-C3: No significant disc protrusion, foraminal stenosis, or canal
stenosis.

C3-C4: No significant disc protrusion, foraminal stenosis, or canal
stenosis.

C4-C5: No significant disc protrusion, foraminal stenosis, or canal
stenosis.

C5-C6: Minimal disc osteophyte complex with left uncovertebral
spurring resulting in mild left foraminal stenosis. No canal
stenosis.

C6-C7: No significant disc protrusion, foraminal stenosis, or canal
stenosis.

C7-T1: No significant disc protrusion, foraminal stenosis, or canal
stenosis.
IMPRESSION: 1. Minimal disc osteophyte complex with left uncovertebral spurring
at C5-C6 resulting in mild left foraminal stenosis.
2. No canal stenosis at any level.

## 2022-06-07 ENCOUNTER — Ambulatory Visit: Payer: No Typology Code available for payment source

## 2022-06-14 ENCOUNTER — Ambulatory Visit: Payer: No Typology Code available for payment source

## 2022-06-16 ENCOUNTER — Other Ambulatory Visit: Payer: Self-pay | Admitting: Internal Medicine

## 2022-06-16 ENCOUNTER — Other Ambulatory Visit: Payer: Self-pay

## 2022-06-16 NOTE — Progress Notes (Signed)
Zach Murle [place] Wetzel [address] [place] Phone: [phone] Subjective:   IVilma Meckel, am serving as a scribe for Dr. Hulan Saas.  I'm seeing this patient by the request  of:  Crecencio Mc, MD  CC: Left shoulder pain follow-up  YTK:[id number]  Lisa [name] is a 47 y.o. female coming in with complaint of L arm pain. Last seen in May 2023 for OMT. Patient states headaches and pain in left shoulder and under scapula. 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 Wants to know if there is anything she should do for that.    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   PONV (postoperative nausea and vomiting)    Pre-syncope 07/14/2014   Shortness of breath    "wheezing"   Weakness of left arm 03/03/2020   Past Surgical History:  Procedure Laterality Date   CESAREAN SECTION     x 2   CHOLECYSTECTOMY  03/09/2022   MICROLARYNGOSCOPY WITH DILATION N/A 03/13/2020   Procedure: MICROLARYNGOSCOPY WITH DILATION w/JET VENT MITOMYCIN C;  Surgeon: Melida Quitter, MD;  Location: [facility];  Service: ENT;  Laterality: N/A;   MICROLARYNGOSCOPY WITH LASER AND BALLOON DILATION N/A 07/20/2012   Procedure: MICROLARYNGOSCOPY WITH LASER AND BALLOON DILATION;  Surgeon: Melida Quitter, MD;  Location: [facility];  Service: ENT;  Laterality: N/A;  WITH JET VENTURI VENTILATION   TONGUE FLAP RELEASE     widom extractions     Social History   Socioeconomic  History   Marital status: Married    Spouse name: Not on file   Number of children: Not on file   Years of education: Not on file   Highest education level: Not on file  Occupational History   Not on file  Tobacco Use   Smoking status: Never   Smokeless tobacco: Never  Vaping Use   Vaping Use: Never used  Substance and Sexual Activity   Alcohol use: Yes    Alcohol/week: 2.0 standard drinks of alcohol    Types: 1 Glasses of wine, 1 Cans of beer per week    Comment: "social"   Drug use: No   Sexual activity: Yes    Birth control/protection: Implant  Other Topics Concern   Not on file  Social History Narrative   Not on file   Social Determinants of Health   Financial Resource Strain: Not on file  Food Insecurity: Not on file  Transportation Needs: Not on file  Physical Activity: Not on file  Stress: Not on file  Social Connections: Not on file   Allergies  Allergen Reactions   Chlorhexidine Gluconate     Developed pruritic rash on abdomen from prep for lap chole Mar 09 2022   Latex Hives    Rash and hives   Family History  Problem Relation Age of Onset   Hypertension Mother    Hyperlipidemia Mother    Arthritis  Mother    Rheum arthritis Mother    Hypertension Father    Hyperlipidemia Father    Cancer Maternal Grandmother        COLON CANCER   Breast cancer Neg Hx     Current Outpatient Medications (Endocrine & Metabolic):    etonogestrel (NEXPLANON) 68 MG IMPL implant, 1 each by Subdermal route once.   metFORMIN (GLUCOPHAGE-XR) 500 MG 24 hr tablet, Take 1 tablet (500 mg total) by mouth daily with breakfast.   Semaglutide,0.25 or 0.5MG /DOS, 2 MG/1.5ML SOPN, Inject 1 mg into the skin once a week.   Current Outpatient Medications (Respiratory):    cetirizine (ZYRTEC) 10 MG tablet, Take 10 mg by mouth daily.   fluticasone (FLONASE) 50 MCG/ACT nasal spray, PLACE 2 SPRAYS INTO BOTH NOSTRILS DAILY.   Fluticasone Furoate (ARNUITY ELLIPTA) 100 MCG/ACT AEPB, Inhale  1 puff into the lungs daily.  Current Outpatient Medications (Analgesics):    acetaminophen (TYLENOL) 500 MG tablet, Take 1,000 mg by mouth every 6 (six) hours as needed.   celecoxib (CELEBREX) 200 MG capsule, Take 1 capsule (200 mg total) by mouth daily.   ibuprofen (ADVIL) 800 MG tablet, Take 1 tablet (800 mg total) by mouth every 8 (eight) hours as needed.  Current Outpatient Medications (Hematological):    folic acid (FOLVITE) 1 MG tablet, Take 1 tablet (1 mg total) by mouth daily.  Current Outpatient Medications (Other):    cholecalciferol (VITAMIN D3) 25 MCG (1000 UNIT) tablet, Take 1,000 Units by mouth daily.   cyclobenzaprine (FLEXERIL) 10 MG tablet, Take 1 tablet (10 mg total) by mouth 3 (three) times daily as needed for muscle spasms.   Insulin Pen Needle (PEN NEEDLES) 32G X 4 MM MISC, Use weekly with ozempic pen   magnesium citrate SOLN, Take 1 Bottle by mouth once.   Misc Natural Products (TART CHERRY ADVANCED PO), Take 1 capsule by mouth daily.   omeprazole (PRILOSEC) 40 MG capsule, TAKE 1 CAPSULE BY MOUTH DAILY.   penciclovir (DENAVIR) 1 % cream, APPLY TOPICALLY EVERY TWO HOURS FOR 4 DAYS AS NEEDED FOR COLD SORES   Semaglutide-Weight Management (WEGOVY) 1.7 MG/0.75ML SOAJ, Inject 1.7 mg into the skin once a week.   Turmeric 400 MG CAPS, Take 400 mg by mouth daily.    Reviewed prior external information including notes and imaging from  primary care provider As well as notes that were available from care everywhere and other healthcare systems.  Past medical history, social, surgical and family history all reviewed in electronic medical record.  No pertanent information unless stated regarding to the chief complaint.   Review of Systems:  No headache, visual changes, nausea, vomiting, diarrhea, constipation, dizziness, abdominal pain, skin rash, fevers, chills, night sweats, weight loss, swollen lymph nodes, body aches, joint swelling, chest pain, shortness of breath, mood  changes. POSITIVE muscle aches  Objective  Blood pressure 114/76, pulse 88, height 5\' 2"  (1.575 m), weight 220 lb (99.8 kg), SpO2 97 %.   General: No apparent distress alert and oriented x3 mood and affect normal, dressed appropriately.  HEENT: Pupils equal, extraocular movements intact  Respiratory: Patient's speak in full sentences and does not appear short of breath  Cardiovascular: No lower extremity edema, non tender, no erythema  Patient does have some scapular dyskinesis noted.  Patient does have some tenderness to palpation more over the neck on the left side.  Still has some tightness noted on the left side with still radicular symptoms in the C7 distribution but a negative Spurling's  today.  Osteopathic findings C4 flexed rotated and side bent left C6 flexed rotated and side bent left T5 extended rotated and side bent left with an inhaled rib L2 flexed rotated and side bent right Sacrum right on right     Impression and Recommendations:      The above documentation has been reviewed and is accurate and complete Lyndal Pulley, DO

## 2022-06-17 ENCOUNTER — Other Ambulatory Visit: Payer: Self-pay

## 2022-06-17 MED FILL — Semaglutide (Weight Mngmt) Soln Auto-Injector 1.7 MG/0.75ML: SUBCUTANEOUS | 28 days supply | Qty: 3 | Fill #0 | Status: AC

## 2022-06-21 ENCOUNTER — Ambulatory Visit: Payer: No Typology Code available for payment source

## 2022-06-21 MED FILL — Omeprazole Cap Delayed Release 40 MG: ORAL | 90 days supply | Qty: 90 | Fill #1 | Status: CN

## 2022-06-22 ENCOUNTER — Ambulatory Visit: Payer: 59 | Admitting: Family Medicine

## 2022-06-22 ENCOUNTER — Other Ambulatory Visit: Payer: Self-pay

## 2022-06-22 VITALS — BP 114/76 | HR 88 | Ht 62.0 in | Wt 220.0 lb

## 2022-06-22 DIAGNOSIS — M9908 Segmental and somatic dysfunction of rib cage: Secondary | ICD-10-CM | POA: Diagnosis not present

## 2022-06-22 DIAGNOSIS — M9902 Segmental and somatic dysfunction of thoracic region: Secondary | ICD-10-CM | POA: Diagnosis not present

## 2022-06-22 DIAGNOSIS — M999 Biomechanical lesion, unspecified: Secondary | ICD-10-CM | POA: Diagnosis not present

## 2022-06-22 DIAGNOSIS — M940 Chondrocostal junction syndrome [Tietze]: Secondary | ICD-10-CM | POA: Diagnosis not present

## 2022-06-22 DIAGNOSIS — M9904 Segmental and somatic dysfunction of sacral region: Secondary | ICD-10-CM

## 2022-06-22 DIAGNOSIS — M9903 Segmental and somatic dysfunction of lumbar region: Secondary | ICD-10-CM

## 2022-06-22 DIAGNOSIS — M9901 Segmental and somatic dysfunction of cervical region: Secondary | ICD-10-CM

## 2022-06-22 NOTE — Assessment & Plan Note (Signed)
Chronic problem with worsening symptoms again.  Patient did have some scapular dyskinesis that I think is contributing as well.  No weakness in the arm but we will continue to monitor for some of the cervical radiculopathy.  Did respond extremely well to osteopathic manipulation.  Does have Zanaflex 4 mg for breakthrough pain.  Follow-up with me again in 6 to 8 weeks.

## 2022-06-22 NOTE — Patient Instructions (Signed)
Good to see you! Doing great with PT See you again in 5-6 weeks

## 2022-06-22 NOTE — Assessment & Plan Note (Signed)

## 2022-06-23 ENCOUNTER — Ambulatory Visit (INDEPENDENT_AMBULATORY_CARE_PROVIDER_SITE_OTHER): Payer: 59 | Admitting: Internal Medicine

## 2022-06-23 ENCOUNTER — Other Ambulatory Visit: Payer: Self-pay

## 2022-06-23 ENCOUNTER — Encounter: Payer: Self-pay | Admitting: Internal Medicine

## 2022-06-23 ENCOUNTER — Encounter: Payer: Self-pay | Admitting: Family Medicine

## 2022-06-23 VITALS — BP 120/74 | HR 82 | Temp 98.2°F | Resp 15 | Ht 62.0 in | Wt 217.8 lb

## 2022-06-23 DIAGNOSIS — Z1231 Encounter for screening mammogram for malignant neoplasm of breast: Secondary | ICD-10-CM | POA: Diagnosis not present

## 2022-06-23 DIAGNOSIS — E785 Hyperlipidemia, unspecified: Secondary | ICD-10-CM | POA: Diagnosis not present

## 2022-06-23 DIAGNOSIS — G8929 Other chronic pain: Secondary | ICD-10-CM | POA: Insufficient documentation

## 2022-06-23 DIAGNOSIS — Z0000 Encounter for general adult medical examination without abnormal findings: Secondary | ICD-10-CM | POA: Diagnosis not present

## 2022-06-23 DIAGNOSIS — Z1159 Encounter for screening for other viral diseases: Secondary | ICD-10-CM | POA: Diagnosis not present

## 2022-06-23 DIAGNOSIS — M546 Pain in thoracic spine: Secondary | ICD-10-CM | POA: Diagnosis not present

## 2022-06-23 DIAGNOSIS — E1169 Type 2 diabetes mellitus with other specified complication: Secondary | ICD-10-CM

## 2022-06-23 DIAGNOSIS — R5383 Other fatigue: Secondary | ICD-10-CM

## 2022-06-23 DIAGNOSIS — M5412 Radiculopathy, cervical region: Secondary | ICD-10-CM | POA: Diagnosis not present

## 2022-06-23 DIAGNOSIS — E119 Type 2 diabetes mellitus without complications: Secondary | ICD-10-CM | POA: Diagnosis not present

## 2022-06-23 LAB — COMPREHENSIVE METABOLIC PANEL
ALT: 21 U/L (ref 0–35)
AST: 18 U/L (ref 0–37)
Albumin: 4.3 g/dL (ref 3.5–5.2)
Alkaline Phosphatase: 59 U/L (ref 39–117)
BUN: 16 mg/dL (ref 6–23)
CO2: 25 mEq/L (ref 19–32)
Calcium: 9.4 mg/dL (ref 8.4–10.5)
Chloride: 106 mEq/L (ref 96–112)
Creatinine, Ser: 0.74 mg/dL (ref 0.40–1.20)
GFR: 97.68 mL/min (ref >60.00)
Glucose, Bld: 99 mg/dL (ref 70–99)
Potassium: 4.3 mEq/L (ref 3.5–5.1)
Sodium: 140 mEq/L (ref 135–145)
Total Bilirubin: 0.7 mg/dL (ref 0.2–1.2)
Total Protein: 6.7 g/dL (ref 6.0–8.3)

## 2022-06-23 LAB — CBC WITH DIFFERENTIAL/PLATELET
Basophils Absolute: 0.1 10*3/uL (ref 0.0–0.1)
Basophils Relative: 1 % (ref 0.0–3.0)
Eosinophils Absolute: 0.1 10*3/uL (ref 0.0–0.7)
Eosinophils Relative: 1.5 % (ref 0.0–5.0)
HCT: 43.8 % (ref 36.0–46.0)
Hemoglobin: 14.7 g/dL (ref 12.0–15.0)
Lymphocytes Relative: 28.2 % (ref 12.0–46.0)
Lymphs Abs: 1.6 10*3/uL (ref 0.7–4.0)
MCHC: 33.6 g/dL (ref 30.0–36.0)
MCV: 85.4 fl (ref 78.0–100.0)
Monocytes Absolute: 0.3 10*3/uL (ref 0.1–1.0)
Monocytes Relative: 5.5 % (ref 3.0–12.0)
Neutro Abs: 3.6 10*3/uL (ref 1.4–7.7)
Neutrophils Relative %: 63.8 % (ref 43.0–77.0)
Platelets: 239 10*3/uL (ref 150.0–400.0)
RBC: 5.13 Mil/uL — ABNORMAL HIGH (ref 3.87–5.11)
RDW: 13.2 % (ref 11.5–15.5)
WBC: 5.7 10*3/uL (ref 4.0–10.5)

## 2022-06-23 LAB — LIPID PANEL
Cholesterol: 172 mg/dL (ref 0–200)
HDL: 39.6 mg/dL (ref >39.00)
LDL Cholesterol: 96 mg/dL (ref 0–99)
NonHDL: 131.95
Total CHOL/HDL Ratio: 4
Triglycerides: 179 mg/dL — ABNORMAL HIGH (ref 0.0–149.0)
VLDL: 35.8 mg/dL (ref 0.0–40.0)

## 2022-06-23 LAB — LDL CHOLESTEROL, DIRECT: Direct LDL: 95 mg/dL

## 2022-06-23 LAB — HEMOGLOBIN A1C: Hgb A1c MFr Bld: 5.7 % (ref 4.6–6.5)

## 2022-06-23 LAB — MICROALBUMIN / CREATININE URINE RATIO
Creatinine,U: 74 mg/dL
Microalb Creat Ratio: 0.9 mg/g (ref 0.0–30.0)
Microalb, Ur: <0.7 mg/dL (ref 0.0–1.9)

## 2022-06-23 MED ORDER — TIZANIDINE HCL 4 MG PO TABS
4.0000 mg | ORAL_TABLET | Freq: Every day | ORAL | 0 refills | Status: DC
  Filled 2022-06-23: qty 30, 30d supply, fill #0

## 2022-06-23 MED ORDER — OMEPRAZOLE 40 MG PO CPDR
40.0000 mg | DELAYED_RELEASE_CAPSULE | Freq: Every day | ORAL | 1 refills | Status: DC
  Filled 2022-06-23: qty 90, fill #0
  Filled 2022-07-13: qty 90, 90d supply, fill #0
  Filled 2022-10-05 – 2022-10-26 (×2): qty 90, 90d supply, fill #1

## 2022-06-23 MED ORDER — WEGOVY 1.7 MG/0.75ML ~~LOC~~ SOAJ
1.7000 mg | SUBCUTANEOUS | 1 refills | Status: DC
  Filled 2022-06-23 – 2022-07-13 (×2): qty 9, 84d supply, fill #0

## 2022-06-23 NOTE — Assessment & Plan Note (Signed)
Mild radicular symptoms to left shoulder due to occupational hazares (wears a 40 lb lead apron daily for 4 to 6 hours.  Back strengthening exercises needed as all therapy has been reactive thys far.  PT referral made

## 2022-06-23 NOTE — Assessment & Plan Note (Signed)

## 2022-06-23 NOTE — Patient Instructions (Signed)
START THE 1.7 MG WEGOVY DOSE  I SENT A 90 REFILL TO [facility] SO YOU CAN FILL AHEAD OF TIME IF THEY WILL LET YOU  PT REFERRAL FOR BACK STRENGTHENING EXERCISES

## 2022-06-23 NOTE — Assessment & Plan Note (Signed)
Advised to start Wegovy 1.7 mg dose having finished the  1.0 mg dose using  samples of the 0.25/0.5 pen .

## 2022-06-23 NOTE — Progress Notes (Signed)
Patient ID: Lisa [name], female    DOB: 03/21/1976  Age: 47 y.o. MRN: [id number]  The patient is here for annual preventive examination and management of other chronic and acute problems.   The risk factors are reflected in the social history.   The roster of all physicians providing medical care to patient - is listed in the Snapshot section of the chart.   Activities of daily living:  The patient is 100% independent in all ADLs: dressing, toileting, feeding as well as independent mobility   Home safety : The patient has smoke detectors in the home. They wear seatbelts.  There are no unsecured firearms at home. There is no violence in the home.    There is no risks for hepatitis, STDs or HIV. There is no   history of blood transfusion. They have no travel history to infectious disease endemic areas of the world.   The patient has seen their dentist in the last six month. They have seen their eye doctor in the last year. The patinet  denies slight hearing difficulty with regard to whispered voices and some television programs.  They have deferred audiologic testing in the last year.  They do not  have excessive sun exposure. Discussed the need for sun protection: hats, long sleeves and use of sunscreen if there is significant sun exposure.    Diet: the importance of a healthy diet is discussed. They do have a healthy diet.   The benefits of regular aerobic exercise were discussed. The patient  exercises  3 to 5 days per week  for  60 minutes.    Depression screen: there are no signs or vegative symptoms of depression- irritability, change in appetite, anhedonia, sadness/tearfullness.   The following portions of the patient's history were reviewed and updated as appropriate: allergies, current medications, past family history, past medical history,  past surgical history, past social history  and problem list.   Visual acuity was not assessed per patient preference since the patient has  regular follow up with an  ophthalmologist. Hearing and body mass index were assessed and reviewed.    During the course of the visit the patient was educated and counseled about appropriate screening and preventive services including : fall prevention , diabetes screening, nutrition counseling, colorectal cancer screening, and recommended immunizations.    Chief Complaint:  Type 2 DM with obesity:  Weight I s  DOWN 6 LBS SINCe  September .  TAKING Ozempic,  CURRENTLY taking 1.0 mg weekly for the past 4 weeks, and ready to start the 1.7 mg   2) NECK PAIN, RIB PAIN . WAS ADJUSTED YESTERDAY. Wears  Lead vest 4  -6 hours daily at work . Last PT was 6 months ago.       Review of Symptoms  Patient denies headache, fevers, malaise, unintentional weight loss, skin rash, eye pain, sinus congestion and sinus pain, sore throat, dysphagia,  hemoptysis , cough, dyspnea, wheezing, chest pain, palpitations, orthopnea, edema, abdominal pain, nausea, melena, diarrhea, constipation, flank pain, dysuria, hematuria, urinary  Frequency, nocturia, numbness, tingling, seizures,  Focal weakness, Loss of consciousness,  Tremor, insomnia, depression, anxiety, and suicidal ideation.    Physical Exam:  There were no vitals taken for this visit.   Physical Exam Vitals reviewed.  Constitutional:      General: She is not in acute distress.    Appearance: Normal appearance. She is well-developed and normal weight. She is not ill-appearing, toxic-appearing or diaphoretic.  HENT:  Head: Normocephalic.     Right Ear: Tympanic membrane, ear canal and external ear normal. There is no impacted cerumen.     Left Ear: Tympanic membrane, ear canal and external ear normal. There is no impacted cerumen.     Nose: Nose normal.     Mouth/Throat:     Mouth: Mucous membranes are moist.     Pharynx: Oropharynx is clear.  Eyes:     General: No scleral icterus.       Right eye: No discharge.        Left eye: No discharge.      Conjunctiva/sclera: Conjunctivae normal.     Pupils: Pupils are equal, round, and reactive to light.  Neck:     Thyroid: No thyromegaly.     Vascular: No carotid bruit or JVD.  Cardiovascular:     Rate and Rhythm: Normal rate and regular rhythm.     Heart sounds: Normal heart sounds.  Pulmonary:     Effort: Pulmonary effort is normal. No respiratory distress.     Breath sounds: Normal breath sounds.  Chest:  Breasts:    Breasts are symmetrical.     Right: Normal. No swelling, inverted nipple, mass, nipple discharge, skin change or tenderness.     Left: Normal. No swelling, inverted nipple, mass, nipple discharge, skin change or tenderness.  Abdominal:     General: Bowel sounds are normal.     Palpations: Abdomen is soft. There is no mass.     Tenderness: There is no abdominal tenderness. There is no guarding or rebound.  Musculoskeletal:        General: Normal range of motion.     Cervical back: Normal range of motion and neck supple.  Lymphadenopathy:     Cervical: No cervical adenopathy.     Upper Body:     Right upper body: No supraclavicular, axillary or pectoral adenopathy.     Left upper body: No supraclavicular, axillary or pectoral adenopathy.  Skin:    General: Skin is warm and dry.  Neurological:     General: No focal deficit present.     Mental Status: She is alert and oriented to person, place, and time. Mental status is at baseline.  Psychiatric:        Mood and Affect: Mood normal.        Behavior: Behavior normal.        Thought Content: Thought content normal.        Judgment: Judgment normal.    Assessment and Plan: Encounter for screening mammogram for malignant neoplasm of breast  Type 2 diabetes mellitus without complication, without long-term current use of insulin (HCC) -     Microalbumin / creatinine urine ratio -     Hemoglobin A1c  Hyperlipidemia associated with type 2 diabetes mellitus (HCC) -     Comprehensive metabolic panel -     Lipid  panel -     LDL cholesterol, direct  Other fatigue -     CBC with Differential/Platelet  Encounter for hepatitis C screening test for low risk patient -     Hepatitis C antibody    No follow-ups on file.  Crecencio Mc, MD

## 2022-06-24 LAB — HEPATITIS C ANTIBODY: Hepatitis C Ab: NONREACTIVE

## 2022-06-25 NOTE — Assessment & Plan Note (Signed)
Improving BMI with appetite suppression.  Encouraged to start exercising regularly. Rx for  Wegovy 1.7 mg dose sent to her pharmacy  Lab Results  Component Value Date   LIPASE 50.0 04/04/2022   Lab Results  Component Value Date   ALT 21 06/23/2022   AST 18 06/23/2022   ALKPHOS 59 06/23/2022   BILITOT 0.7 06/23/2022

## 2022-06-26 NOTE — Assessment & Plan Note (Signed)
10 yr risk is low  Based on prior labs.  Repeat labs reviewed; ;  Will recommend statin for ten year risk> 8%   Lab Results  Component Value Date   CHOL 172 06/23/2022   HDL 39.60 06/23/2022   LDLCALC 96 06/23/2022   LDLDIRECT 95.0 06/23/2022   TRIG 179.0 (H) 06/23/2022   CHOLHDL 4 06/23/2022

## 2022-06-26 NOTE — Assessment & Plan Note (Signed)
Secondary to strain of thoracic muscles during wearing of Lead apron for several hours during work.  PT referral for strengthening exercises recommended and accepted

## 2022-06-28 ENCOUNTER — Ambulatory Visit: Payer: No Typology Code available for payment source

## 2022-06-29 ENCOUNTER — Other Ambulatory Visit: Payer: Self-pay

## 2022-06-29 ENCOUNTER — Telehealth: Payer: Self-pay

## 2022-06-29 DIAGNOSIS — Z1211 Encounter for screening for malignant neoplasm of colon: Secondary | ICD-10-CM

## 2022-06-29 DIAGNOSIS — Z800 Family history of malignant neoplasm of digestive organs: Secondary | ICD-10-CM

## 2022-06-29 MED ORDER — NA SULFATE-K SULFATE-MG SULF 17.5-3.13-1.6 GM/177ML PO SOLN
1.0000 | Freq: Once | ORAL | 0 refills | Status: AC
  Filled 2022-06-29: qty 354, 1d supply, fill #0

## 2022-06-29 NOTE — Telephone Encounter (Signed)
Gastroenterology Pre-Procedure Review  Request Date: 07/11/22 Requesting Physician: Dr. Vicente Males  PATIENT REVIEW QUESTIONS: The patient responded to the following health history questions as indicated:    1. Are you having any GI issues? no 2. Do you have a personal history of Polyps? no 3. Do you have a family history of Colon Cancer or Polyps? yes (maternal great grandmother colon cancer) 4. Diabetes Mellitus? no 5. Joint replacements in the past 12 months? Gallbladder Surgery October 2023 6. Major health problems in the past 3 months?no 7. Any artificial heart valves, MVP, or defibrillator?no    MEDICATIONS & ALLERGIES:    Patient reports the following regarding taking any anticoagulation/antiplatelet therapy:   Plavix, Coumadin, Eliquis, Xarelto, Lovenox, Pradaxa, Brilinta, or Effient? no Aspirin? no  Patient confirms/reports the following medications:  Current Outpatient Medications  Medication Sig Dispense Refill   acetaminophen (TYLENOL) 500 MG tablet Take 1,000 mg by mouth every 6 (six) hours as needed.     cetirizine (ZYRTEC) 10 MG tablet Take 10 mg by mouth daily.     cholecalciferol (VITAMIN D3) 25 MCG (1000 UNIT) tablet Take 1,000 Units by mouth daily.     cyclobenzaprine (FLEXERIL) 10 MG tablet Take 1 tablet (10 mg total) by mouth 3 (three) times daily as needed for muscle spasms. 90 tablet 1   etonogestrel (NEXPLANON) 68 MG IMPL implant 1 each by Subdermal route once.     fluticasone (FLONASE) 50 MCG/ACT nasal spray PLACE 2 SPRAYS INTO BOTH NOSTRILS DAILY. 48 g 1   Fluticasone Furoate (ARNUITY ELLIPTA) 100 MCG/ACT AEPB Inhale 1 puff into the lungs daily. 30 each 3   folic acid (FOLVITE) 1 MG tablet Take 1 tablet (1 mg total) by mouth daily. 90 tablet 1   magnesium citrate SOLN Take 1 Bottle by mouth once.     metFORMIN (GLUCOPHAGE-XR) 500 MG 24 hr tablet Take 1 tablet (500 mg total) by mouth daily with breakfast. 90 tablet 1   Misc Natural Products (TART CHERRY ADVANCED PO)  Take 1 capsule by mouth daily.     omeprazole (PRILOSEC) 40 MG capsule Take 1 capsule (40 mg total) by mouth daily. 90 capsule 1   penciclovir (DENAVIR) 1 % cream APPLY TOPICALLY EVERY TWO HOURS FOR 4 DAYS AS NEEDED FOR COLD SORES 5 g 0   Semaglutide-Weight Management (WEGOVY) 1.7 MG/0.75ML SOAJ Inject 1.7 mg into the skin once a week. 9 mL 1   tiZANidine (ZANAFLEX) 4 MG tablet Take 1 tablet (4 mg total) by mouth at bedtime. 30 tablet 0   Turmeric 400 MG CAPS Take 400 mg by mouth daily.      No current facility-administered medications for this visit.    Patient confirms/reports the following allergies:  Allergies  Allergen Reactions   Chlorhexidine Gluconate     Developed pruritic rash on abdomen from prep for lap chole Mar 09 2022   Latex Hives    Rash and hives    No orders of the defined types were placed in this encounter.   AUTHORIZATION INFORMATION Primary Insurance: 1D#: Group #:  Secondary Insurance: 1D#: Group #:  SCHEDULE INFORMATION: Date: 07/11/22 Time: Location: [facility]

## 2022-06-30 DIAGNOSIS — E119 Type 2 diabetes mellitus without complications: Secondary | ICD-10-CM | POA: Diagnosis not present

## 2022-06-30 DIAGNOSIS — H43813 Vitreous degeneration, bilateral: Secondary | ICD-10-CM | POA: Diagnosis not present

## 2022-06-30 LAB — HM DIABETES EYE EXAM

## 2022-07-05 ENCOUNTER — Ambulatory Visit: Payer: No Typology Code available for payment source

## 2022-07-08 ENCOUNTER — Encounter: Payer: Self-pay | Admitting: Gastroenterology

## 2022-07-11 ENCOUNTER — Encounter
Admission: RE | Disposition: A | Discharge status: Home / Self Care | Payer: Self-pay | Source: Home / Self Care | Attending: Gastroenterology

## 2022-07-11 ENCOUNTER — Ambulatory Visit
Admission: RE | Admit: 2022-07-11 | Discharge: 2022-07-11 | Disposition: A | Discharge status: Home / Self Care | Payer: 59 | Attending: Gastroenterology | Admitting: Gastroenterology

## 2022-07-11 ENCOUNTER — Ambulatory Visit: Payer: 59 | Admitting: Anesthesiology

## 2022-07-11 DIAGNOSIS — Z6838 Body mass index (BMI) 38.0-38.9, adult: Secondary | ICD-10-CM | POA: Insufficient documentation

## 2022-07-11 DIAGNOSIS — Z800 Family history of malignant neoplasm of digestive organs: Secondary | ICD-10-CM | POA: Diagnosis not present

## 2022-07-11 DIAGNOSIS — Z1211 Encounter for screening for malignant neoplasm of colon: Secondary | ICD-10-CM

## 2022-07-11 DIAGNOSIS — R7303 Prediabetes: Secondary | ICD-10-CM | POA: Diagnosis not present

## 2022-07-11 DIAGNOSIS — K219 Gastro-esophageal reflux disease without esophagitis: Secondary | ICD-10-CM | POA: Insufficient documentation

## 2022-07-11 DIAGNOSIS — I10 Essential (primary) hypertension: Secondary | ICD-10-CM | POA: Diagnosis not present

## 2022-07-11 DIAGNOSIS — E669 Obesity, unspecified: Secondary | ICD-10-CM | POA: Insufficient documentation

## 2022-07-11 HISTORY — PX: COLONOSCOPY WITH PROPOFOL: SHX5780

## 2022-07-11 HISTORY — DX: Type 2 diabetes mellitus without complications: E11.9

## 2022-07-11 LAB — GLUCOSE, CAPILLARY: Glucose-Capillary: 122 mg/dL — ABNORMAL HIGH (ref 70–99)

## 2022-07-11 LAB — POCT PREGNANCY, URINE: Preg Test, Ur: NEGATIVE

## 2022-07-11 SURGERY — COLONOSCOPY WITH PROPOFOL
Anesthesia: General

## 2022-07-11 MED ORDER — LIDOCAINE HCL (CARDIAC) PF 100 MG/5ML IV SOSY
PREFILLED_SYRINGE | INTRAVENOUS | Status: DC | PRN
  Administered 2022-07-11: 50 mg via INTRAVENOUS

## 2022-07-11 MED ORDER — PROPOFOL 10 MG/ML IV BOLUS
INTRAVENOUS | Status: DC | PRN
  Administered 2022-07-11: 50 mg via INTRAVENOUS

## 2022-07-11 MED ORDER — SODIUM CHLORIDE 0.9 % IV SOLN
INTRAVENOUS | Status: DC
  Administered 2022-07-11: 20 mL/h via INTRAVENOUS

## 2022-07-11 MED ORDER — PROPOFOL 500 MG/50ML IV EMUL
INTRAVENOUS | Status: DC | PRN
  Administered 2022-07-11: 150 ug/kg/min via INTRAVENOUS

## 2022-07-11 NOTE — Transfer of Care (Signed)
Immediate Anesthesia Transfer of Care Note  Patient: Lisa [name]  Procedure(s) Performed: COLONOSCOPY WITH PROPOFOL  Patient Location: PACU and Endoscopy Unit  Anesthesia Type:General  Level of Consciousness: awake, drowsy, and patient cooperative  Airway & Oxygen Therapy: Patient Spontanous Breathing  Post-op Assessment: Report given to RN and Post -op Vital signs reviewed and stable  Post vital signs: Reviewed and stable  Last Vitals:  Vitals Value Taken Time  BP 98/68 07/11/22 0837  Temp 35.8 C 07/11/22 0837  Pulse 77 07/11/22 0838  Resp 19 07/11/22 0838  SpO2 99 % 07/11/22 0838  Vitals shown include unvalidated device data.  Last Pain:  Vitals:   07/11/22 0837  TempSrc: Temporal  PainSc: 0-No pain         Complications: No notable events documented.

## 2022-07-11 NOTE — H&P (Signed)
Jonathon Bellows, MD [address] [place], [place], [place], [address] [address], [place], [place], [place], [address] Phone: [phone]  Fax: [phone]  Primary Care Physician:  Crecencio Mc, MD   Pre-Procedure History & Physical: HPI:  Lisa [name] is a 47 y.o. female is here for an colonoscopy.   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   PONV (postoperative nausea and vomiting)    Pre-syncope 07/14/2014   Shortness of breath    "wheezing"   Weakness of left arm 03/03/2020    Past Surgical History:  Procedure Laterality Date   CESAREAN SECTION     x 2   CHOLECYSTECTOMY  03/09/2022   MICROLARYNGOSCOPY WITH DILATION N/A 03/13/2020   Procedure: MICROLARYNGOSCOPY WITH DILATION w/JET VENT MITOMYCIN C;  Surgeon: Melida Quitter, MD;  Location: [facility];  Service: ENT;  Laterality: N/A;   MICROLARYNGOSCOPY WITH LASER AND BALLOON DILATION N/A 07/20/2012   Procedure: MICROLARYNGOSCOPY WITH LASER AND BALLOON DILATION;  Surgeon: Melida Quitter, MD;  Location: [facility];  Service: ENT;  Laterality: N/A;  WITH JET [place]     widom extractions      Prior to Admission medications   Medication Sig Start Date End Date Taking? Authorizing Provider  cetirizine (ZYRTEC) 10 MG tablet Take 10 mg by mouth daily.   Yes [provider]  cholecalciferol (VITAMIN D3) 25 MCG (1000 UNIT) tablet Take 1,000 Units by mouth daily.   Yes [provider]  cyclobenzaprine (FLEXERIL) 10 MG tablet Take 1 tablet (10 mg total) by mouth 3 (three) times daily as needed for muscle spasms. 09/23/21  Yes Crecencio Mc, MD  etonogestrel (NEXPLANON) 68 MG IMPL implant 1 each by Subdermal route once.   Yes [provider]  folic acid (FOLVITE) 1 MG tablet Take 1 tablet (1 mg total) by mouth daily. 09/23/21  Yes Crecencio Mc, MD  magnesium citrate SOLN Take 1 Bottle by mouth once.   Yes [provider]  metFORMIN (GLUCOPHAGE-XR) 500 MG 24 hr tablet Take 1 tablet (500 mg total) by mouth daily with breakfast. 02/24/22  Yes Crecencio Mc, MD  omeprazole (PRILOSEC) 40 MG capsule Take 1 capsule (40 mg total) by mouth daily. 06/23/22 06/23/23 Yes Crecencio Mc, MD  Semaglutide-Weight Management (WEGOVY) 1.7 MG/0.75ML SOAJ Inject 1.7 mg into the skin once a week. 06/23/22  Yes Crecencio Mc, MD  tiZANidine (ZANAFLEX) 4 MG tablet Take 1 tablet (4 mg total) by mouth at bedtime. 06/23/22  Yes Lyndal Pulley, DO  Turmeric 400 MG CAPS Take 400 mg by mouth daily.    Yes [provider]  fluticasone (FLONASE) 50 MCG/ACT nasal spray PLACE 2 SPRAYS INTO BOTH NOSTRILS DAILY. Patient not taking: Reported on 07/11/2022 01/17/22 01/17/23  Kennyth Arnold, FNP  Fluticasone Furoate (ARNUITY ELLIPTA) 100 MCG/ACT AEPB Inhale 1 puff into the lungs daily. 11/10/21  Tyler Pita, MD  levalbuterol [facility] HFA) 45 MCG/ACT inhaler Inhale 2 puffs into the lungs every 6 (six) hours as needed for wheezing. 11/09/21 11/10/21  Einar Pheasant, MD    Allergies as of 06/29/2022 - Review Complete 06/29/2022  Allergen Reaction Noted   Chlorhexidine gluconate  04/04/2022   Latex Hives 07/19/2012    Family History  Problem Relation Age of Onset   Hypertension Mother    Hyperlipidemia Mother    Arthritis Mother    Rheum arthritis Mother    Hypertension Father    Hyperlipidemia Father    Cancer Maternal Grandmother        COLON CANCER   Breast  cancer Neg Hx     Social History   Socioeconomic History   Marital status: Married    Spouse name: Not on file   Number of children: Not on file   Years of education: Not on file   Highest education level: Not on file  Occupational History   Not on file  Tobacco Use   Smoking status: Never   Smokeless tobacco: Never  Vaping Use   Vaping Use: Never used  Substance and Sexual Activity   Alcohol use: Yes    Alcohol/week: 2.0 standard drinks of alcohol    Types: 1 Glasses of wine, 1 Cans of beer per week    Comment: "social"   Drug use: No   Sexual activity: Yes    Birth control/protection: Implant  Other Topics Concern   Not on file  Social History Narrative   Not on file   Social Determinants of Health   Financial Resource Strain: Not on file  Food Insecurity: Not on file  Transportation Needs: Not on file  Physical Activity: Not on file  Stress: Not on file  Social Connections: Not on file  Intimate Partner Violence: Not on file    Review of Systems: See HPI, otherwise negative ROS  Physical Exam: BP 124/86   Pulse 88   Temp 98 F (36.7 C) (Temporal)   Resp 20   Ht '5\' 4"'$  (1.626 m)   Wt 100.9 kg   SpO2 100%   BMI 38.17 kg/m  General:   Alert,  pleasant and cooperative in NAD Head:  Normocephalic and atraumatic. Neck:  Supple; no masses or thyromegaly. Lungs:  Clear throughout to auscultation, normal respiratory effort.    Heart:  +S1, +S2, Regular rate and rhythm, No edema. Abdomen:  Soft, nontender and nondistended. Normal bowel sounds, without guarding, and without rebound.   Neurologic:  Alert and  oriented x4;  grossly normal neurologically.  Impression/Plan: Lisa [name] is here for an colonoscopy to be performed for Screening colonoscopy average risk   Risks, benefits, limitations, and alternatives regarding  colonoscopy have been reviewed with the patient.  Questions have been answered.  All parties agreeable.   Jonathon Bellows, MD   07/11/2022, 8:09 AM

## 2022-07-11 NOTE — Op Note (Signed)
[facility] Gastroenterology Patient Name: Lisa [name] Procedure Date: 07/11/2022 8:11 AM MRN: [id number] Account #: [id number] Date of Birth: 1975/09/05 Admit Type: Outpatient Age: 47 Room: [facility] ENDO ROOM 1 Gender: Female Note Status: Finalized Instrument Name: Jasper Riling [id number] Procedure:             Colonoscopy Indications:           Screening for colorectal malignant neoplasm Providers:             Jonathon Bellows MD, MD Referring MD:          Deborra Medina, MD (Referring MD) Medicines:             Monitored Anesthesia Care Complications:         No immediate complications. Procedure:             Pre-Anesthesia Assessment:                        - Prior to the procedure, a History and Physical was                         performed, and patient medications, allergies and                         sensitivities were reviewed. The patient's tolerance                         of previous anesthesia was reviewed.                        - The risks and benefits of the procedure and the                         sedation options and risks were discussed with the                         patient. All questions were answered and informed                         consent was obtained.                        - ASA Grade Assessment: II - A patient with mild                         systemic disease.                        After obtaining informed consent, the colonoscope was                         passed under direct vision. Throughout the procedure,                         the patient's blood pressure, pulse, and oxygen                         saturations were monitored continuously. The                         Colonoscope was introduced  through the anus and                         advanced to the the cecum, identified by the                         appendiceal orifice. The colonoscopy was performed                         with ease. The patient tolerated the procedure well.                          The quality of the bowel preparation was excellent.                         The ileocecal valve, appendiceal orifice, and rectum                         were photographed. Findings:      The entire examined colon appeared normal on direct and retroflexion       views. Impression:            - The entire examined colon is normal on direct and                         retroflexion views.                        - No specimens collected. Recommendation:        - Discharge patient to home (with escort).                        - Resume previous diet.                        - Continue present medications.                        - Repeat colonoscopy in 10 years for screening                         purposes. Procedure Code(s):     --- Professional ---                        [id number], Colonoscopy, flexible; diagnostic, including                         collection of specimen(s) by brushing or washing, when                         performed (separate procedure) Diagnosis Code(s):     --- Professional ---                        Z12.11, Encounter for screening for malignant neoplasm                         of colon CPT copyright 2022 American Medical Association. All rights reserved. The codes documented in this report are preliminary and upon coder review may  be revised to meet current  compliance requirements. Jonathon Bellows, MD Jonathon Bellows MD, MD 07/11/2022 8:33:44 AM This report has been signed electronically. Number of Addenda: 0 Note Initiated On: 07/11/2022 8:11 AM Scope Withdrawal Time: 0 hours 8 minutes 19 seconds  Total Procedure Duration: 0 hours 11 minutes 7 seconds  Estimated Blood Loss:  Estimated blood loss: none.      [facility]

## 2022-07-11 NOTE — Anesthesia Procedure Notes (Signed)
Procedure Name: MAC Date/Time: 07/11/2022 8:17 AM  Performed by: Jerrye Noble, CRNAPre-anesthesia Checklist: Patient identified, Emergency Drugs available, Suction available and Patient being monitored Patient Re-evaluated:Patient Re-evaluated prior to induction Oxygen Delivery Method: Nasal cannula

## 2022-07-11 NOTE — Anesthesia Preprocedure Evaluation (Addendum)
Anesthesia Evaluation  Patient identified by MRN, date of birth, ID band Patient awake    Reviewed: Allergy & Precautions, NPO status , Patient's Chart, lab work & pertinent test results  History of Anesthesia Complications (+) PONV and history of anesthetic complications  Airway Mallampati: III   Neck ROM: Full    Dental  (+) Chipped   Pulmonary  Subglottic stenosis s/p tracheal dilations x2; 6.0 ETT required in the past   Pulmonary exam normal breath sounds clear to auscultation       Cardiovascular hypertension, Normal cardiovascular exam Rhythm:Regular Rate:Normal  ECG 02/25/22: normal   Neuro/Psych  Headaches    GI/Hepatic ,GERD  ,,  Endo/Other  Obesity; prediabetes  Renal/GU negative Renal ROS     Musculoskeletal  (+) Arthritis ,    Abdominal   Peds  Hematology negative hematology ROS (+)   Anesthesia Other Findings Last dose of Semaglutide February 4  Reproductive/Obstetrics                             Anesthesia Physical Anesthesia Plan  ASA: 2  Anesthesia Plan: General   Post-op Pain Management:    Induction: Intravenous  PONV Risk Score and Plan: 4 or greater and Propofol infusion, TIVA and Treatment may vary due to age or medical condition  Airway Management Planned: Natural Airway  Additional Equipment:   Intra-op Plan:   Post-operative Plan:   Informed Consent: I have reviewed the patients History and Physical, chart, labs and discussed the procedure including the risks, benefits and alternatives for the proposed anesthesia with the patient or authorized representative who has indicated his/her understanding and acceptance.       Plan Discussed with: CRNA  Anesthesia Plan Comments: (LMA/GETA backup discussed.  Patient consented for risks of anesthesia including but not limited to:  - adverse reactions to medications - damage to eyes, teeth, lips or other  oral mucosa - nerve damage due to positioning  - sore throat or hoarseness - damage to heart, brain, nerves, lungs, other parts of body or loss of life  Informed patient about role of CRNA in peri- and intra-operative care.  Patient voiced understanding.)        Anesthesia Quick Evaluation

## 2022-07-11 NOTE — Anesthesia Postprocedure Evaluation (Signed)
Anesthesia Post Note  Patient: Lisa [name]  Procedure(s) Performed: COLONOSCOPY WITH PROPOFOL  Patient location during evaluation: PACU Anesthesia Type: General Level of consciousness: awake and alert, oriented and patient cooperative Pain management: pain level controlled Vital Signs Assessment: post-procedure vital signs reviewed and stable Respiratory status: spontaneous breathing, nonlabored ventilation and respiratory function stable Cardiovascular status: blood pressure returned to baseline and stable Postop Assessment: adequate PO intake Anesthetic complications: no   No notable events documented.   Last Vitals:  Vitals:   07/11/22 0847 07/11/22 0857  BP: 108/65 108/73  Pulse: 77 60  Resp: (!) 23 14  Temp:    SpO2: 100% 100%    Last Pain:  Vitals:   07/11/22 0857  TempSrc:   PainSc: 0-No pain                 Darrin Nipper

## 2022-07-12 ENCOUNTER — Ambulatory Visit: Payer: No Typology Code available for payment source

## 2022-07-12 ENCOUNTER — Encounter: Payer: Self-pay | Admitting: Gastroenterology

## 2022-07-13 ENCOUNTER — Other Ambulatory Visit: Payer: Self-pay

## 2022-07-19 ENCOUNTER — Other Ambulatory Visit: Payer: Self-pay

## 2022-07-19 ENCOUNTER — Telehealth: Payer: 59 | Admitting: Family Medicine

## 2022-07-19 ENCOUNTER — Ambulatory Visit: Payer: No Typology Code available for payment source

## 2022-07-19 DIAGNOSIS — J40 Bronchitis, not specified as acute or chronic: Secondary | ICD-10-CM | POA: Diagnosis not present

## 2022-07-19 MED ORDER — AMOXICILLIN-POT CLAVULANATE 875-125 MG PO TABS
1.0000 | ORAL_TABLET | Freq: Two times a day (BID) | ORAL | 0 refills | Status: AC
  Filled 2022-07-19: qty 14, 7d supply, fill #0

## 2022-07-19 MED ORDER — ALBUTEROL SULFATE HFA 108 (90 BASE) MCG/ACT IN AERS
2.0000 | INHALATION_SPRAY | Freq: Four times a day (QID) | RESPIRATORY_TRACT | 0 refills | Status: DC | PRN
  Filled 2022-07-19: qty 6.7, 25d supply, fill #0

## 2022-07-19 MED ORDER — BENZONATATE 100 MG PO CAPS
100.0000 mg | ORAL_CAPSULE | Freq: Two times a day (BID) | ORAL | 0 refills | Status: DC | PRN
  Filled 2022-07-19: qty 20, 10d supply, fill #0

## 2022-07-19 MED ORDER — AZITHROMYCIN 250 MG PO TABS
250.0000 mg | ORAL_TABLET | Freq: Every day | ORAL | 0 refills | Status: DC
  Filled 2022-07-19: qty 6, 5d supply, fill #0

## 2022-07-19 NOTE — Progress Notes (Signed)
We are sorry that you are not feeling well.  Here is how we plan to help!  Based on your presentation I believe you most likely have A cough due to a virus.  This is called viral bronchitis and is best treated by rest, plenty of fluids and control of the cough.  You may use Ibuprofen or Tylenol as directed to help your symptoms.     In addition you may use A non-prescription cough medication called Mucinex DM: take 2 tablets every 12 hours. and A prescription cough medication called Tessalon Perles '100mg'$ . You may take 1-2 capsules every 8 hours as needed for your cough.  I will also order an Inhaler - Albuterol and a delayed antibiotic to be filled in 3 days if not better- Augmentin   From your responses in the eVisit questionnaire you describe inflammation in the upper respiratory tract which is causing a significant cough.  This is commonly called Bronchitis and has four common causes:   Allergies Viral Infections Acid Reflux Bacterial Infection Allergies, viruses and acid reflux are treated by controlling symptoms or eliminating the cause. An example might be a cough caused by taking certain blood pressure medications. You stop the cough by changing the medication. Another example might be a cough caused by acid reflux. Controlling the reflux helps control the cough.  USE OF BRONCHODILATOR ("RESCUE") INHALERS: There is a risk from using your bronchodilator too frequently.  The risk is that over-reliance on a medication which only relaxes the muscles surrounding the breathing tubes can reduce the effectiveness of medications prescribed to reduce swelling and congestion of the tubes themselves.  Although you feel brief relief from the bronchodilator inhaler, your asthma may actually be worsening with the tubes becoming more swollen and filled with mucus.  This can delay other crucial treatments, such as oral steroid medications. If you need to use a bronchodilator inhaler daily, several times per  day, you should discuss this with your provider.  There are probably better treatments that could be used to keep your asthma under control.     HOME CARE Only take medications as instructed by your medical team. Complete the entire course of an antibiotic. Drink plenty of fluids and get plenty of rest. Avoid close contacts especially the very young and the elderly Cover your mouth if you cough or cough into your sleeve. Always remember to wash your hands A steam or ultrasonic humidifier can help congestion.   GET HELP RIGHT AWAY IF: You develop worsening fever. You become short of breath You cough up blood. Your symptoms persist after you have completed your treatment plan MAKE SURE YOU  Understand these instructions. Will watch your condition. Will get help right away if you are not doing well or get worse.    Thank you for choosing an e-visit.  Your e-visit answers were reviewed by a board certified advanced clinical practitioner to complete your personal care plan. Depending upon the condition, your plan could have included both over the counter or prescription medications.  Please review your pharmacy choice. Make sure the pharmacy is open so you can pick up prescription now. If there is a problem, you may contact your provider through [employer] and have the prescription routed to another pharmacy.  Your safety is important to [place]. If you have drug allergies check your prescription carefully.   For the next 24 hours you can use MyChart to ask questions about today's visit, request a non-urgent call back, or ask for  a work or school excuse. You will get an email in the next two days asking about your experience. I hope that your e-visit has been valuable and will speed your recovery.  I provided 5 minutes of non face-to-face time during this encounter for chart review, medication and order placement, as well as and documentation.

## 2022-07-20 ENCOUNTER — Telehealth: Payer: Self-pay | Admitting: Pulmonary Disease

## 2022-07-20 ENCOUNTER — Other Ambulatory Visit: Payer: Self-pay

## 2022-07-20 NOTE — Telephone Encounter (Signed)
Patient states having symptoms of cough, wheezing and shortness of breath. Pharmacy is [facility]. Patient phone number is [phone].

## 2022-07-20 NOTE — Telephone Encounter (Signed)
I would start the Z-Pak.  I would also use the albuterol regularly 4 times a day while she is feeling this way.  Continue using the Arnuity as well.

## 2022-07-20 NOTE — Telephone Encounter (Signed)
I notified the patient.   Nothing further needed. 

## 2022-07-20 NOTE — Telephone Encounter (Signed)
I spoke with the patient.  SOB, Cough and wheezing for 6 days. No F/C/S since 2 days after symptoms started. Cough with yellow sputum Chest tightness when she coughs Covid test on 2/29 was negative. She did a virtual visit with PCP yesterday who sent her in Augmentin to be started on 3/8 if symptoms did not improve. They also gave her Albuterol that she has been using. She is still using her Arnuity inhaler She had a 6 days Medrol Pack at home that she started Friday and completed today. She said it may have helped a little. She was prescribed a Zpack yesterday as well that she has not started.  She wants to know your thoughts on what she should do?

## 2022-07-26 ENCOUNTER — Ambulatory Visit: Payer: No Typology Code available for payment source

## 2022-08-02 ENCOUNTER — Ambulatory Visit: Payer: No Typology Code available for payment source

## 2022-08-03 NOTE — Progress Notes (Unsigned)
Lisa Lisa [name] Lisa Lisa [name] Phone: [phone] Subjective:   Lisa Lisa [name], am serving as a scribe for Dr. Hulan Saas.  I'm seeing this patient by the request  of:  Lisa Mc, MD  CC: Back and neck pain follow-up  [id number]  Lisa Lisa [name] is a 47 y.o. female coming in with complaint of back and neck pain. OMT 06/22/2022. Patient states that she has had good and bad days. Uses mm relaxer prn.  Patient feels like when she had a cough it seemed to make things significantly worse.  Not as much as the radiation of the pain down the arm but just more of the discomfort.  Denies of any shortness of breath.  Medications patient has been prescribed: Zanaflex  Taking: Yes         Reviewed prior external information including notes and imaging from previsou exam, outside providers and external EMR if available.   As well as notes that were available from care everywhere and other healthcare systems.  Past medical history, social, surgical and family history all reviewed in electronic medical record.  Lisa [name] pertanent information unless stated regarding to the chief complaint.   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 POSITIVE muscle aches  Objective  Blood pressure 102/68, pulse 80, height 5\' 4"  (1.626 m), weight 224 lb (101.6 kg), SpO2 98 %.   General: Lisa [name] apparent distress alert and oriented x3 mood and affect normal, dressed appropriately.  HEENT: Pupils equal, extraocular movements intact  Respiratory: Patient's speak in full sentences and does not appear short of breath  Cardiovascular: Lisa [name] lower extremity edema, non tender, Lisa [name] erythema  Gait MSK:  Back does have some loss lordosis.  Significant tightness noted in the parascapular area left greater than right.  Patient does have tightness with sidebending bilaterally.  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  Discussed with patient about icing regimen and home exercises.  Discussed  with patient about which activities to do and which ones to avoid.  Patient will continue to work on posture and ergonomics.  Do believe that breast tissue is also likely playing a role.  Follow-up again in 6 to 8 weeks.  Motion sickness Patient will be going on a cruise and get the scopolamine patch.  Warned of potential side effects    Nonallopathic problems  Decision today to treat with OMT was based on Physical Exam  After verbal consent patient was  treated with HVLA, ME, FPR techniques in cervical, rib, thoracic, lumbar, and sacral  areas  Patient tolerated the procedure well with improvement in symptoms  Patient given exercises, stretches and lifestyle modifications  See medications in patient instructions if given  Patient will follow up in 4-8 weeks     The above documentation has been reviewed and is accurate and complete Lisa Pulley, DO         Note: This dictation was prepared with Dragon dictation along with smaller phrase technology. Any transcriptional errors that result from this process are unintentional.

## 2022-08-04 ENCOUNTER — Encounter: Payer: Self-pay | Admitting: Family Medicine

## 2022-08-04 ENCOUNTER — Other Ambulatory Visit: Payer: Self-pay

## 2022-08-04 ENCOUNTER — Ambulatory Visit: Payer: 59 | Admitting: Family Medicine

## 2022-08-04 VITALS — BP 102/68 | HR 80 | Ht 64.0 in | Wt 224.0 lb

## 2022-08-04 DIAGNOSIS — M940 Chondrocostal junction syndrome [Tietze]: Secondary | ICD-10-CM | POA: Diagnosis not present

## 2022-08-04 DIAGNOSIS — M9904 Segmental and somatic dysfunction of sacral region: Secondary | ICD-10-CM

## 2022-08-04 DIAGNOSIS — T753XXA Motion sickness, initial encounter: Secondary | ICD-10-CM | POA: Insufficient documentation

## 2022-08-04 DIAGNOSIS — M9901 Segmental and somatic dysfunction of cervical region: Secondary | ICD-10-CM

## 2022-08-04 DIAGNOSIS — M9902 Segmental and somatic dysfunction of thoracic region: Secondary | ICD-10-CM | POA: Diagnosis not present

## 2022-08-04 DIAGNOSIS — M9908 Segmental and somatic dysfunction of rib cage: Secondary | ICD-10-CM | POA: Diagnosis not present

## 2022-08-04 DIAGNOSIS — M9903 Segmental and somatic dysfunction of lumbar region: Secondary | ICD-10-CM

## 2022-08-04 MED ORDER — SCOPOLAMINE 1 MG/3DAYS TD PT72
1.0000 | MEDICATED_PATCH | TRANSDERMAL | 0 refills | Status: DC
  Filled 2022-08-04 – 2022-08-12 (×2): qty 8, 24d supply, fill #0

## 2022-08-04 NOTE — Assessment & Plan Note (Signed)
Chronic problem with exacerbation and worsening symptoms with patient having worsening pain after a cough.  Discussed with patient about icing regimen and home exercises.  Discussed with patient about which activities to do and which ones to avoid.  Patient will continue to work on posture and ergonomics.  Do believe that breast tissue is also likely playing a role.  Follow-up again in 6 to 8 weeks.

## 2022-08-04 NOTE — Patient Instructions (Addendum)
Good to see you See me again in 6-7 weeks 

## 2022-08-04 NOTE — Assessment & Plan Note (Signed)
Patient will be going on a cruise and get the scopolamine patch.  Warned of potential side effects

## 2022-08-12 ENCOUNTER — Other Ambulatory Visit: Payer: Self-pay

## 2022-08-13 ENCOUNTER — Encounter: Payer: Self-pay | Admitting: Internal Medicine

## 2022-08-13 ENCOUNTER — Encounter: Payer: Self-pay | Admitting: Family Medicine

## 2022-08-17 ENCOUNTER — Encounter: Payer: Self-pay | Admitting: Emergency Medicine

## 2022-08-17 ENCOUNTER — Emergency Department
Admission: EM | Admit: 2022-08-17 | Discharge: 2022-08-17 | Disposition: A | Discharge status: Home / Self Care | Payer: 59 | Attending: Emergency Medicine | Admitting: Emergency Medicine

## 2022-08-17 ENCOUNTER — Ambulatory Visit
Admission: EM | Admit: 2022-08-17 | Discharge: 2022-08-17 | Disposition: A | Discharge status: Other Acute Inpatient Hospital | Payer: 59 | Source: Home / Self Care

## 2022-08-17 ENCOUNTER — Other Ambulatory Visit: Payer: Self-pay

## 2022-08-17 ENCOUNTER — Emergency Department: Payer: 59

## 2022-08-17 ENCOUNTER — Ambulatory Visit: Admit: 2022-08-17 | Discharge: 2022-08-17 | Disposition: A | Discharge status: Home / Self Care | Payer: 59

## 2022-08-17 DIAGNOSIS — R109 Unspecified abdominal pain: Secondary | ICD-10-CM | POA: Diagnosis not present

## 2022-08-17 DIAGNOSIS — R197 Diarrhea, unspecified: Secondary | ICD-10-CM

## 2022-08-17 DIAGNOSIS — R112 Nausea with vomiting, unspecified: Secondary | ICD-10-CM | POA: Insufficient documentation

## 2022-08-17 DIAGNOSIS — D72829 Elevated white blood cell count, unspecified: Secondary | ICD-10-CM

## 2022-08-17 DIAGNOSIS — I10 Essential (primary) hypertension: Secondary | ICD-10-CM | POA: Insufficient documentation

## 2022-08-17 DIAGNOSIS — R1013 Epigastric pain: Secondary | ICD-10-CM | POA: Insufficient documentation

## 2022-08-17 DIAGNOSIS — R1010 Upper abdominal pain, unspecified: Secondary | ICD-10-CM | POA: Diagnosis not present

## 2022-08-17 LAB — URINALYSIS, ROUTINE W REFLEX MICROSCOPIC
Bilirubin Urine: NEGATIVE
Glucose, UA: NEGATIVE mg/dL
Hgb urine dipstick: NEGATIVE
Ketones, ur: 20 mg/dL — AB
Leukocytes,Ua: NEGATIVE
Nitrite: NEGATIVE
Protein, ur: NEGATIVE mg/dL
Specific Gravity, Urine: 1.029 (ref 1.005–1.030)
pH: 5 (ref 5.0–8.0)

## 2022-08-17 LAB — COMPREHENSIVE METABOLIC PANEL
ALT: 118 U/L — ABNORMAL HIGH (ref 0–44)
AST: 79 U/L — ABNORMAL HIGH (ref 15–41)
Albumin: 4 g/dL (ref 3.5–5.0)
Alkaline Phosphatase: 109 U/L (ref 38–126)
Anion gap: 8 (ref 5–15)
BUN: 17 mg/dL (ref 6–20)
CO2: 20 mmol/L — ABNORMAL LOW (ref 22–32)
Calcium: 8.7 mg/dL — ABNORMAL LOW (ref 8.9–10.3)
Chloride: 105 mmol/L (ref 98–111)
Creatinine, Ser: 0.56 mg/dL (ref 0.44–1.00)
GFR, Estimated: >60 mL/min (ref >60)
Glucose, Bld: 130 mg/dL — ABNORMAL HIGH (ref 70–99)
Potassium: 3.5 mmol/L (ref 3.5–5.1)
Sodium: 133 mmol/L — ABNORMAL LOW (ref 135–145)
Total Bilirubin: 1 mg/dL (ref 0.3–1.2)
Total Protein: 7.3 g/dL (ref 6.5–8.1)

## 2022-08-17 LAB — CBC WITH DIFFERENTIAL/PLATELET
Abs Immature Granulocytes: 0.14 10*3/uL — ABNORMAL HIGH (ref 0.00–0.07)
Basophils Absolute: 0 10*3/uL (ref 0.0–0.1)
Basophils Relative: 0 %
Eosinophils Absolute: 1.9 10*3/uL — ABNORMAL HIGH (ref 0.0–0.5)
Eosinophils Relative: 12 %
HCT: 44.8 % (ref 36.0–46.0)
Hemoglobin: 15.4 g/dL — ABNORMAL HIGH (ref 12.0–15.0)
Immature Granulocytes: 1 %
Lymphocytes Relative: 4 %
Lymphs Abs: 0.6 10*3/uL — ABNORMAL LOW (ref 0.7–4.0)
MCH: 28.5 pg (ref 26.0–34.0)
MCHC: 34.4 g/dL (ref 30.0–36.0)
MCV: 83 fL (ref 80.0–100.0)
Monocytes Absolute: 0.6 10*3/uL (ref 0.1–1.0)
Monocytes Relative: 4 %
Neutro Abs: 12.6 10*3/uL — ABNORMAL HIGH (ref 1.7–7.7)
Neutrophils Relative %: 79 %
Platelets: 263 10*3/uL (ref 150–400)
RBC: 5.4 MIL/uL — ABNORMAL HIGH (ref 3.87–5.11)
RDW: 13.4 % (ref 11.5–15.5)
WBC: 15.9 10*3/uL — ABNORMAL HIGH (ref 4.0–10.5)
nRBC: 0 % (ref 0.0–0.2)

## 2022-08-17 LAB — LIPASE, BLOOD: Lipase: 35 U/L (ref 11–51)

## 2022-08-17 LAB — POC URINE PREG, ED: Preg Test, Ur: NEGATIVE

## 2022-08-17 MED ORDER — IOHEXOL 300 MG/ML  SOLN
100.0000 mL | Freq: Once | INTRAMUSCULAR | Status: AC | PRN
  Administered 2022-08-17: 100 mL via INTRAVENOUS

## 2022-08-17 MED ORDER — ONDANSETRON 4 MG PO TBDP
4.0000 mg | ORAL_TABLET | Freq: Once | ORAL | Status: AC
  Administered 2022-08-17: 4 mg via ORAL

## 2022-08-17 MED ORDER — ONDANSETRON HCL 4 MG/2ML IJ SOLN
4.0000 mg | Freq: Once | INTRAMUSCULAR | Status: AC
  Administered 2022-08-17: 4 mg via INTRAVENOUS
  Filled 2022-08-17: qty 2

## 2022-08-17 MED ORDER — KETOROLAC TROMETHAMINE 15 MG/ML IJ SOLN
15.0000 mg | Freq: Once | INTRAMUSCULAR | Status: AC
  Administered 2022-08-17: 15 mg via INTRAVENOUS
  Filled 2022-08-17: qty 1

## 2022-08-17 MED ORDER — LACTATED RINGERS IV BOLUS
1000.0000 mL | Freq: Once | INTRAVENOUS | Status: AC
  Administered 2022-08-17: 1000 mL via INTRAVENOUS

## 2022-08-17 MED ORDER — ONDANSETRON 4 MG PO TBDP
4.0000 mg | ORAL_TABLET | Freq: Three times a day (TID) | ORAL | 0 refills | Status: DC | PRN
  Filled 2022-08-17: qty 20, 7d supply, fill #0

## 2022-08-17 MED ORDER — SUCRALFATE 1 G PO TABS
1.0000 g | ORAL_TABLET | Freq: Four times a day (QID) | ORAL | 1 refills | Status: DC
  Filled 2022-08-17: qty 2, 1d supply, fill #0
  Filled 2022-08-17: qty 118, 29d supply, fill #0

## 2022-08-17 MED ORDER — PANTOPRAZOLE SODIUM 40 MG IV SOLR
40.0000 mg | Freq: Once | INTRAVENOUS | Status: AC
  Administered 2022-08-17: 40 mg via INTRAVENOUS
  Filled 2022-08-17: qty 10

## 2022-08-17 MED ORDER — NAPROXEN 500 MG PO TABS
500.0000 mg | ORAL_TABLET | Freq: Two times a day (BID) | ORAL | 0 refills | Status: DC
  Filled 2022-08-17: qty 20, 10d supply, fill #0

## 2022-08-17 NOTE — ED Triage Notes (Addendum)
Pt states abd pain with nausea and vomiting/diarrhea that began at 1900 last pm. Pt states pain is epigastric. Pt states was sent by urgent care to r/o pancreatitis. Urgent care gave 4mg  of zofran as well.

## 2022-08-17 NOTE — Discharge Instructions (Signed)
-  We have drawn some labs today: CBC, CMP and lipase.  Inform the ER of this.  You are also given 8 mg ODT Zofran. - Your white blood cell count is very high and indicates possible infection.  I am concerned about pancreatitis, stomach ulcer and other conditions that can cause this pain.  Is also possible you could have a stomach virus but more emergent things need to be ruled out so please go to the emergency department.  You have been advised to follow up immediately in the emergency department for concerning signs.symptoms. If you declined EMS transport, please have a family member take you directly to the ED at this time. Do not delay. Based on concerns about condition, if you do not follow up in th e ED, you may risk poor outcomes including worsening of condition, delayed treatment and potentially life threatening issues. If you have declined to go to the ED at this time, you should call your PCP immediately to set up a follow up appointment.  Go to ED for red flag symptoms, including; fevers you cannot reduce with Tylenol/Motrin, severe headaches, vision changes, numbness/weakness in part of the body, lethargy, confusion, intractable vomiting, severe dehydration, chest pain, breathing difficulty, severe persistent abdominal or pelvic pain, signs of severe infection (increased redness, swelling of an area), feeling faint or passing out, dizziness, etc. You should especially go to the ED for sudden acute worsening of condition if you do not elect to go at this time.

## 2022-08-17 NOTE — ED Notes (Signed)
Patient is being discharged from the Urgent Care and sent to the Emergency Department via personal vehicle . Per Laurene Footman PA, patient is in need of higher level of care due to abdominal pain, leukocytosis. Patient is aware and verbalizes understanding of plan of care.  Vitals:   08/17/22 0822  BP: 109/64  Pulse: 92  Resp: 16  Temp: 98 F (36.7 C)  SpO2: 98%

## 2022-08-17 NOTE — ED Notes (Signed)
Pt had no issues drinking water.

## 2022-08-17 NOTE — ED Triage Notes (Signed)
Pt presents with epigastric pain that started last night. Pt states the pain radiates to her back. Pt had several cases of vomiting and diarrhea.

## 2022-08-17 NOTE — ED Provider Notes (Signed)
[facility] Provider Note    Event Date/Time   First MD Initiated Contact with Patient 08/17/22 1055     (approximate)   History   Chief Complaint: Abdominal Pain   HPI  Lisa [name] is a 47 y.o. female with a past history of hypertension, GERD who comes to the ED for evaluation of upper abdominal pain with nausea vomiting diarrhea that began 7 PM yesterday.  Waxing waning symptoms, no aggravating or alleviating factors.  Was initially seen in urgent care this morning, had labs done which were unremarkable but was sent to the ED for possible pancreatitis.  She is status postcholecystectomy.  No fever.  No hematemesis or melena/bloody stool.     Physical Exam   Triage Vital Signs: ED Triage Vitals  Enc Vitals Group     BP 08/17/22 0945 119/78     Pulse Rate 08/17/22 0944 93     Resp 08/17/22 0944 18     Temp 08/17/22 0945 98.2 F (36.8 C)     Temp Source 08/17/22 0944 Oral     SpO2 08/17/22 0944 98 %     Weight 08/17/22 0944 222 lb 10.6 oz (101 kg)     Height 08/17/22 0944 5\' 4"  (1.626 m)     Head Circumference --      Peak Flow --      Pain Score 08/17/22 0944 4     Pain Loc --      Pain Edu? --      Excl. in [place]? --     Most recent vital signs: Vitals:   08/17/22 0944 08/17/22 0945  BP:  119/78  Pulse: 93   Resp: 18   Temp:  98.2 F (36.8 C)  SpO2: 98%     General: Awake, no distress.  CV:  Good peripheral perfusion.  Regular rate rhythm Resp:  Normal effort.  Good auscultation bilaterally Abd:  No distention.  Soft, mild epigastric and left upper quadrant tenderness Other:  Moist oral mucosa, no rash, no lower extremity edema   ED Results / Procedures / Treatments   Labs (all labs ordered are listed, but only abnormal results are displayed) Labs Reviewed  URINALYSIS, ROUTINE W REFLEX MICROSCOPIC - Abnormal; Notable for the following components:      Result Value   Color, Urine YELLOW (*)    APPearance CLEAR (*)     Ketones, ur 20 (*)    All other components within normal limits  POC URINE PREG, ED     EKG    RADIOLOGY CT abdomen pelvis interpreted by me, overall unremarkable.  Radiology report reviewed noting mild areas of colitis   PROCEDURES:  Procedures   MEDICATIONS ORDERED IN ED: Medications  lactated ringers bolus 1,000 mL (0 mLs Intravenous Stopped 08/17/22 1328)  pantoprazole (PROTONIX) injection 40 mg (40 mg Intravenous Given 08/17/22 1209)  ketorolac (TORADOL) 15 MG/ML injection 15 mg (15 mg Intravenous Given 08/17/22 1209)  ondansetron (ZOFRAN) injection 4 mg (4 mg Intravenous Given 08/17/22 1209)  iohexol (OMNIPAQUE) 300 MG/ML solution 100 mL (100 mLs Intravenous Contrast Given 08/17/22 1218)     IMPRESSION / MDM / ASSESSMENT AND PLAN / ED COURSE  I reviewed the triage vital signs and the nursing notes.  DDx: Pancreatitis, gastritis/GERD, diverticulitis, viral illness  Patient's presentation is most consistent with acute presentation with potential threat to life or bodily function.  Pt p/w epigastric pain. Labs, CT unremarkable except for findings suspicious for viral gastroenteritis. Feeling  better after supportive meds and IVF. Stable for DC.        FINAL CLINICAL IMPRESSION(S) / ED DIAGNOSES   Final diagnoses:  Pain of upper abdomen     Rx / DC Orders   ED Discharge Orders          Ordered    ondansetron (ZOFRAN-ODT) 4 MG disintegrating tablet  Every 8 hours PRN        08/17/22 1323    sucralfate (CARAFATE) 1 g tablet  4 times daily        08/17/22 1323    naproxen (NAPROSYN) 500 MG tablet  2 times daily with meals        08/17/22 1323             Note:  This document was prepared using Dragon voice recognition software and may include unintentional dictation errors.   Carrie Mew, MD 08/17/22 1536

## 2022-08-17 NOTE — ED Provider Notes (Signed)
[facility]    CSN: [id number] Arrival date & time: 08/17/22  0802      History   Chief Complaint Chief Complaint  Patient presents with   Abdominal Pain    HPI Lisa [name] is a 47 y.o. female presenting for epigastric and periumbilical abdominal pain with associated nausea, vomiting and loose stools that began at 7 PM last night.  Patient denies any associated fever.  Reports loss of appetite.  Has had multiple episodes of watery diarrhea.  States the pain is a "tightness." Pain radiates toward her back. She says it is reminiscent of when she had her "gallbladder attack" and gallbladder taken out at the end of last year.  She reports she contacted her doctor who thought she should be seen and evaluated for possible retained gallstone.  No sick contacts.  Patient does have history of GERD and takes 40 mg of omeprazole daily.  She denies any history of PUD.  Reports taking hydrocodone last night for the pain and says it did help.  She is not reporting any URI symptoms, chest pain, shortness of breath, urinary symptoms.  No other concerns.  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   PONV (postoperative nausea and vomiting)    Pre-syncope 07/14/2014   Shortness of breath    "wheezing"   Weakness of left arm 03/03/2020    Patient Active Problem List   Diagnosis Date Noted   Motion sickness 08/04/2022   Chronic bilateral thoracic back pain 06/23/2022   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   Chronic bronchitis 04/04/2022   GERD (gastroesophageal reflux disease) [id number]   Complication of anesthesia 03/08/2022   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   Family history of colon cancer requiring screening colonoscopy 02/24/2022   Trigger point of left shoulder region 10/06/2021   Cervical radiculitis 03/03/2020   Weakness of left arm 03/03/2020   Cervicalgia 02/28/2020   Spondylosis, cervical, 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   Loud snoring 08/01/2019   B12 deficiency 08/01/2019   Eczema of left external ear 08/01/2019   Hyperlipidemia associated with type 2 diabetes mellitus 07/31/2019   Nonallopathic lesion of cervical region 04/29/2019   Nonallopathic lesion of thoracic region 04/29/2019   Nonallopathic lesion of rib cage 04/29/2019   Cervical radiculopathy at C7 03/26/2019   Allergic rhinitis due to pollen 08/18/2018   Type 2 diabetes mellitus without complications [id number]   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   Encounter for screening colonoscopy 04/29/2016   Low back pain without sciatica 04/28/2015   Morbid obesity 04/28/2015    Past Surgical History:  Procedure Laterality Date   CESAREAN SECTION     x 2   CHOLECYSTECTOMY  03/09/2022   COLONOSCOPY WITH PROPOFOL N/A 07/11/2022   Procedure: COLONOSCOPY WITH PROPOFOL;  Surgeon: Jonathon Bellows, MD;  Location: [facility] ENDOSCOPY;  Service: Gastroenterology;  Laterality: N/A;   MICROLARYNGOSCOPY WITH DILATION N/A 03/13/2020   Procedure: MICROLARYNGOSCOPY WITH DILATION w/JET VENT MITOMYCIN C;  Surgeon: Melida Quitter, MD;  Location: [place];  Service: ENT;  Laterality: N/A;   MICROLARYNGOSCOPY WITH LASER AND BALLOON DILATION N/A 07/20/2012   Procedure: MICROLARYNGOSCOPY WITH LASER AND BALLOON DILATION;  Surgeon: Melida Quitter, MD;  Location: [place];  Service: ENT;  Laterality: N/A;  WITH JET VENTURI VENTILATION   TONGUE FLAP RELEASE     widom extractions      OB History   No obstetric history on  file.      Home Medications    Prior to Admission medications   Medication Sig Start Date End Date Taking? Authorizing Provider  albuterol (VENTOLIN HFA) 108 (90 Base) MCG/ACT inhaler Inhale 2 puffs into the lungs every 6 (six) hours as needed for wheezing or shortness of breath. 07/19/22   Perlie Mayo, NP  azithromycin (ZITHROMAX) 250 MG tablet Take 2 tablets by mouth on day 1, then one tablet daily. 07/19/22     benzonatate (TESSALON) 100 MG capsule Take 1 capsule (100 mg total) by mouth 2 (two) times daily as needed for cough. 07/19/22   Perlie Mayo, NP  cetirizine (ZYRTEC) 10 MG tablet Take 10 mg by mouth daily.    [provider]  cholecalciferol (VITAMIN D3) 25 MCG (1000 UNIT) tablet Take 1,000 Units by mouth daily.    [provider]  cyclobenzaprine (FLEXERIL) 10 MG tablet Take 1 tablet (10 mg total) by mouth 3 (three) times daily as needed for muscle spasms. 09/23/21   Crecencio Mc, MD  etonogestrel (NEXPLANON) 68 MG IMPL implant 1 each by Subdermal route once.    [provider]  fluticasone (FLONASE) 50 MCG/ACT nasal spray PLACE 2 SPRAYS INTO BOTH NOSTRILS DAILY. 01/17/22 01/17/23  Kennyth Arnold, FNP  Fluticasone Furoate (ARNUITY ELLIPTA) 100 MCG/ACT AEPB Inhale 1 puff into the lungs daily. 11/10/21   Tyler Pita, MD  folic acid (FOLVITE) 1 MG tablet Take 1 tablet (1 mg total) by mouth daily. 09/23/21   Crecencio Mc, MD  magnesium citrate SOLN Take 1 Bottle by mouth once.    [provider]  metFORMIN (GLUCOPHAGE-XR) 500 MG 24 hr tablet Take 1 tablet (500 mg total) by mouth daily with breakfast. 02/24/22   Crecencio Mc, MD  omeprazole (PRILOSEC) 40 MG capsule Take 1 capsule (40 mg total) by mouth daily. 06/23/22 06/23/23  Crecencio Mc, MD  scopolamine (TRANSDERM-SCOP) 1 MG/3DAYS Place 1 patch (1.5 mg total) onto the skin every 3 (three) days. 08/04/22   Lyndal Pulley, DO  Semaglutide-Weight Management (WEGOVY) 1.7 MG/0.75ML SOAJ  Inject 1.7 mg into the skin once a week. 06/23/22   Crecencio Mc, MD  tiZANidine (ZANAFLEX) 4 MG tablet Take 1 tablet (4 mg total) by mouth at bedtime. 06/23/22   Lyndal Pulley, DO  Turmeric 400 MG CAPS Take 400 mg by mouth daily.     [provider]  levalbuterol Penne Lash HFA) 45 MCG/ACT inhaler Inhale 2 puffs into the lungs every 6 (six) hours as needed for wheezing. 11/09/21 11/10/21  Einar Pheasant, MD    Family History Family History  Problem Relation Age of Onset   Hypertension Mother    Hyperlipidemia Mother    Arthritis Mother    Rheum arthritis Mother    Hypertension Father    Hyperlipidemia Father    Cancer Maternal Grandmother        COLON CANCER   Breast cancer Neg Hx     Social History Social History   Tobacco Use  Smoking status: Never   Smokeless tobacco: Never  Vaping Use   Vaping Use: Never used  Substance Use Topics   Alcohol use: Yes    Alcohol/week: 2.0 standard drinks of alcohol    Types: 1 Glasses of wine, 1 Cans of beer per week    Comment: "social"   Drug use: No     Allergies   Chlorhexidine gluconate and Latex   Review of Systems Review of Systems  Constitutional:  Positive for appetite change. Negative for fatigue and fever.  HENT:  Negative for congestion.   Respiratory:  Negative for cough and shortness of breath.   Cardiovascular:  Negative for chest pain.  Gastrointestinal:  Positive for abdominal pain, diarrhea, nausea and vomiting. Negative for abdominal distention, blood in stool and constipation.  Genitourinary:  Negative for dysuria, frequency and pelvic pain.  Musculoskeletal:  Positive for back pain.  Neurological:  Negative for weakness.     Physical Exam Triage Vital Signs ED Triage Vitals  Enc Vitals Group     BP      Pulse      Resp      Temp      Temp src      SpO2      Weight      Height      Head Circumference      Peak Flow      Pain Score      Pain Loc      Pain Edu?      Excl. in [place]?     No data found.  Updated Vital Signs BP 109/64 (BP Location: Left Arm)   Pulse 92   Temp 98 F (36.7 C) (Oral)   Resp 16   SpO2 98%      Physical Exam Vitals and nursing note reviewed.  Constitutional:      General: She is not in acute distress.    Appearance: Normal appearance. She is not ill-appearing or toxic-appearing.     Comments: Appears to be in pain.  HENT:     Head: Normocephalic and atraumatic.     Nose: Nose normal.     Mouth/Throat:     Mouth: Mucous membranes are moist.     Pharynx: Oropharynx is clear.  Eyes:     General: No scleral icterus.       Right eye: No discharge.        Left eye: No discharge.     Conjunctiva/sclera: Conjunctivae normal.  Cardiovascular:     Rate and Rhythm: Normal rate and regular rhythm.     Heart sounds: Normal heart sounds.  Pulmonary:     Effort: Pulmonary effort is normal. No respiratory distress.     Breath sounds: Normal breath sounds.  Abdominal:     Palpations: Abdomen is soft.     Tenderness: There is abdominal tenderness (epigastric, periumbilical and mildly RUQ). There is no right CVA tenderness, left CVA tenderness, guarding or rebound.  Musculoskeletal:     Cervical back: Neck supple.  Skin:    General: Skin is dry.  Neurological:     General: No focal deficit present.     Mental Status: She is alert. Mental status is at baseline.     Motor: No weakness.     Gait: Gait normal.  Psychiatric:        Mood and Affect: Mood normal.        Behavior: Behavior normal.        Thought Content:  Thought content normal.      UC Treatments / Results  Labs (all labs ordered are listed, but only abnormal results are displayed) Labs Reviewed  CBC WITH DIFFERENTIAL/PLATELET - Abnormal; Notable for the following components:      Result Value   WBC 15.9 (*)    RBC 5.40 (*)    Hemoglobin 15.4 (*)    Neutro Abs 12.6 (*)    Lymphs Abs 0.6 (*)    Eosinophils Absolute 1.9 (*)    Abs Immature Granulocytes 0.14 (*)     All other components within normal limits  COMPREHENSIVE METABOLIC PANEL  LIPASE, BLOOD    EKG   Radiology No results found.  Procedures Procedures (including critical care time)   [facility] Gastroenterology Patient Name: Kresha Campion Procedure Date: 07/11/2022 8:11 AM MRN: [id number] Account #: [id number] Date of Birth: [birth year] Admit Type: Outpatient Age: 89 Room: [facility] ENDO ROOM 1 Gender: Female Note Status: Finalized Instrument Name: Jasper Riling [id number] Procedure:             Colonoscopy Indications:           Screening for colorectal malignant neoplasm Providers:             Jonathon Bellows MD, MD Referring MD:          Deborra Medina, MD (Referring MD) Medicines:             Monitored Anesthesia Care Complications:         No immediate complications. Procedure:             Pre-Anesthesia Assessment:                        - Prior to the procedure, a History and Physical was                         performed, and patient medications, allergies and                         sensitivities were reviewed. The patient's tolerance                         of previous anesthesia was reviewed.                        - The risks and benefits of the procedure and the                         sedation options and risks were discussed with the                         patient. All questions were answered and informed                         consent was obtained.                        - ASA Grade Assessment: II - A patient with mild                         systemic disease.  The entire examined colon appeared normal on direct and retroflexion       views. Impression:            - The entire examined colon is normal on direct and                         retroflexion views.                        - No specimens collected. Recommendation:        - Discharge patient to home (with escort).                        - Resume previous diet.                         - Continue present medications.                        - Repeat colonoscopy in 10 years for screening                         purposes.         Medications Ordered in UC Medications  ondansetron (ZOFRAN-ODT) disintegrating tablet 4 mg (4 mg Oral Given 08/17/22 0843)  ondansetron (ZOFRAN-ODT) disintegrating tablet 4 mg (4 mg Oral Given 08/17/22 0843)    Initial Impression / Assessment and Plan / UC Course  I have reviewed the triage vital signs and the nursing notes.  Pertinent labs & imaging results that were available during my care of the patient were reviewed by me and considered in my medical decision making (see chart for details).   47 year old female presents for epigastric, periumbilical and mild right upper quadrant "tight" abdominal pain with associated nausea, vomiting and diarrhea.  Symptoms began last night.  No sick contacts.  History of cholelithiasis and cholecystectomy in October 2023.  Patient says symptoms feel similar.  Vitals are normal and stable.  She is afebrile.  She is not ill-appearing but does appear to be in quite a bit of discomfort.  On exam she has most tenderness of the epigastric and periumbilical region with slight tenderness to palpation of the right upper quadrant.  No CVA tenderness.  Chest clear auscultation.  Remainder of exam normal.  Patient given 8 milligrams ODT Zofran in clinic for relief of acute nausea.  Ordered CBC, CMP, lipase and right upper quadrant ultrasound.   CBC shows elevated WBC count of 15.9 with left shift.  Advised patient of her elevated white blood cell count with left shift.  Advised her of concern for infection.  Given the severity of her pain and fact that radiates to her back I am concerned about possible pancreatitis. Pending lipase. Other concerns are PUD potentially with rupture, retained gallstone, gastritis/gastroenteritis, infectious diarrhea.  Given her history advised patient she needs further  workup likely to include CT abdomen/ pelvis, potential ultrasound or ERCP and management of her pain.  Advised patient I would cancel the right upper quadrant ultrasound since her labs indicate that she may need to be in the emergency department.  She is agreeable to go to [facility] at this time.  Her husband is taking her.  Leaving in stable condition.  Lipase is WNL (35) . Doubtful pancreatitis.  CMP shows elevated AST (79) and ALT (118) from  baseline. Also elevated glucose 130.  Based on all of patient's labs and persistent localized pain, patient still likely needs CT imaging to rule out acute abdominal pathologies.    Final Clinical Impressions(s) / UC Diagnoses   Final diagnoses:  Abdominal pain, epigastric  Nausea vomiting and diarrhea  Leukocytosis, unspecified type     Discharge Instructions      -We have drawn some labs today: CBC, CMP and lipase.  Inform the ER of this.  You are also given 8 mg ODT Zofran. - Your white blood cell count is very high and indicates possible infection.  I am concerned about pancreatitis, stomach ulcer and other conditions that can cause this pain.  Is also possible you could have a stomach virus but more emergent things need to be ruled out so please go to the emergency department.  You have been advised to follow up immediately in the emergency department for concerning signs.symptoms. If you declined EMS transport, please have a family member take you directly to the ED at this time. Do not delay. Based on concerns about condition, if you do not follow up in th e ED, you may risk poor outcomes including worsening of condition, delayed treatment and potentially life threatening issues. If you have declined to go to the ED at this time, you should call your PCP immediately to set up a follow up appointment.  Go to ED for red flag symptoms, including; fevers you cannot reduce with Tylenol/Motrin, severe headaches, vision changes, numbness/weakness in part  of the body, lethargy, confusion, intractable vomiting, severe dehydration, chest pain, breathing difficulty, severe persistent abdominal or pelvic pain, signs of severe infection (increased redness, swelling of an area), feeling faint or passing out, dizziness, etc. You should especially go to the ED for sudden acute worsening of condition if you do not elect to go at this time.      ED Prescriptions   None    PDMP not reviewed this encounter.   Danton Clap, PA-C 08/17/22 1330

## 2022-08-18 ENCOUNTER — Ambulatory Visit: Payer: 59 | Admitting: Pulmonary Disease

## 2022-08-19 ENCOUNTER — Telehealth: Payer: Self-pay

## 2022-08-19 ENCOUNTER — Other Ambulatory Visit: Payer: Self-pay

## 2022-08-19 MED ORDER — METRONIDAZOLE 500 MG PO TABS
500.0000 mg | ORAL_TABLET | Freq: Three times a day (TID) | ORAL | 0 refills | Status: DC
  Filled 2022-08-19: qty 21, 7d supply, fill #0

## 2022-08-19 MED ORDER — CIPROFLOXACIN HCL 500 MG PO TABS
500.0000 mg | ORAL_TABLET | Freq: Two times a day (BID) | ORAL | 0 refills | Status: DC
  Filled 2022-08-19: qty 14, 7d supply, fill #0

## 2022-08-19 NOTE — Transitions of Care (Post Inpatient/ED Visit) (Signed)
   08/19/2022  Name: Lisa [name] MRN: [id number] DOB: December 11, 1975  Spoke with pt, she reports that she is still having some abdominal pain.  She has picked up medications and is using them.  Requests to have appointment week of 08/28/22 because she will be out of town next week.  Pt reports poor appetite but is trying to eat bland things, reports eating yogurt, mashed potatoes, rice and crackers.  Encouraged pt to listen to her body and advance diet as she tolerates it.   Today's TOC FU Call Status: Today's TOC FU Call Status:: Successful TOC FU Call Competed Unsuccessful Call (1st Attempt) Date: 08/19/22 [facility] FU Call Complete Date: 08/19/22  Transition Care Management Follow-up Telephone Call Date of Discharge: 08/17/22 Discharge Facility: [facility]) Type of Discharge: Emergency Department How have you been since you were released from the hospital?: Same  Items Reviewed: Did you receive and understand the discharge instructions provided?: Yes Medications obtained and verified?: Yes (Medications Reviewed) Any new allergies since your discharge?: No Dietary orders reviewed?: NA (No specific diet orders written but did discuss bland diet.) Do you have support at home?: Yes People in Home: spouse Name of Support/Comfort Primary Source: [facility] and Equipment/Supplies: Were Home Health Services Ordered?: No Any new equipment or medical supplies ordered?: No  Functional Questionnaire: Do you need assistance with bathing/showering or dressing?: No Do you need assistance with meal preparation?: No Do you need assistance with eating?: No Do you have difficulty maintaining continence: No Do you need assistance with getting out of bed/getting out of a chair/moving?: No Do you have difficulty managing or taking your medications?: No  Follow up appointments reviewed: PCP Follow-up appointment confirmed?: Yes Date of PCP follow-up appointment?:  08/31/22 Follow-up Provider: Dr. Darrick Huntsman Specialist [facility] Follow-up appointment confirmed?: NA Do you need transportation to your follow-up appointment?: No Do you understand care options if your condition(s) worsen?: Yes-patient verbalized understanding    SIGNATURE Valentino Nose, RN

## 2022-08-19 NOTE — Transitions of Care (Post Inpatient/ED Visit) (Signed)
   08/19/2022  Name: Lisa [name] MRN: [id number] DOB: 12/19/1975  Today's TOC FU Call Status: Today's TOC FU Call Status:: Unsuccessul Call (1st Attempt) Unsuccessful Call (1st Attempt) Date: 08/19/22  Attempted to reach the patient regarding the most recent Inpatient/ED visit.  Follow Up Plan: Additional outreach attempts will be made to reach the patient to complete the Transitions of Care (Post Inpatient/ED visit) call.   Signature  Valentino Nose, RN

## 2022-08-31 ENCOUNTER — Ambulatory Visit (INDEPENDENT_AMBULATORY_CARE_PROVIDER_SITE_OTHER): Payer: 59 | Admitting: Internal Medicine

## 2022-08-31 ENCOUNTER — Encounter: Payer: Self-pay | Admitting: Internal Medicine

## 2022-08-31 ENCOUNTER — Other Ambulatory Visit: Payer: Self-pay

## 2022-08-31 VITALS — BP 102/74 | HR 88 | Temp 98.3°F | Ht 64.0 in | Wt 220.2 lb

## 2022-08-31 DIAGNOSIS — R1013 Epigastric pain: Secondary | ICD-10-CM

## 2022-08-31 DIAGNOSIS — R748 Abnormal levels of other serum enzymes: Secondary | ICD-10-CM

## 2022-08-31 DIAGNOSIS — Z9049 Acquired absence of other specified parts of digestive tract: Secondary | ICD-10-CM | POA: Diagnosis not present

## 2022-08-31 DIAGNOSIS — R1011 Right upper quadrant pain: Secondary | ICD-10-CM | POA: Diagnosis not present

## 2022-08-31 DIAGNOSIS — R7401 Elevation of levels of liver transaminase levels: Secondary | ICD-10-CM | POA: Diagnosis not present

## 2022-08-31 DIAGNOSIS — J40 Bronchitis, not specified as acute or chronic: Secondary | ICD-10-CM | POA: Insufficient documentation

## 2022-08-31 LAB — COMPREHENSIVE METABOLIC PANEL
ALT: 33 U/L (ref 0–35)
AST: 27 U/L (ref 0–37)
Albumin: 4.1 g/dL (ref 3.5–5.2)
Alkaline Phosphatase: 80 U/L (ref 39–117)
BUN: 11 mg/dL (ref 6–23)
CO2: 23 mEq/L (ref 19–32)
Calcium: 9 mg/dL (ref 8.4–10.5)
Chloride: 108 mEq/L (ref 96–112)
Creatinine, Ser: 0.65 mg/dL (ref 0.40–1.20)
GFR: 106.16 mL/min (ref >60.00)
Glucose, Bld: 92 mg/dL (ref 70–99)
Potassium: 4 mEq/L (ref 3.5–5.1)
Sodium: 141 mEq/L (ref 135–145)
Total Bilirubin: 0.8 mg/dL (ref 0.2–1.2)
Total Protein: 6.4 g/dL (ref 6.0–8.3)

## 2022-08-31 LAB — CBC WITH DIFFERENTIAL/PLATELET
Basophils Absolute: 0.1 10*3/uL (ref 0.0–0.1)
Basophils Relative: 1 % (ref 0.0–3.0)
Eosinophils Absolute: 0.3 10*3/uL (ref 0.0–0.7)
Eosinophils Relative: 4.6 % (ref 0.0–5.0)
HCT: 41.9 % (ref 36.0–46.0)
Hemoglobin: 14 g/dL (ref 12.0–15.0)
Lymphocytes Relative: 15 % (ref 12.0–46.0)
Lymphs Abs: 1 10*3/uL (ref 0.7–4.0)
MCHC: 33.4 g/dL (ref 30.0–36.0)
MCV: 85.2 fl (ref 78.0–100.0)
Monocytes Absolute: 0.4 10*3/uL (ref 0.1–1.0)
Monocytes Relative: 5.9 % (ref 3.0–12.0)
Neutro Abs: 5 10*3/uL (ref 1.4–7.7)
Neutrophils Relative %: 73.5 % (ref 43.0–77.0)
Platelets: 211 10*3/uL (ref 150.0–400.0)
RBC: 4.92 Mil/uL (ref 3.87–5.11)
RDW: 14.1 % (ref 11.5–15.5)
WBC: 6.8 10*3/uL (ref 4.0–10.5)

## 2022-08-31 LAB — LIPASE: Lipase: 36 U/L (ref 11.0–59.0)

## 2022-08-31 MED ORDER — CHERATUSSIN AC 100-10 MG/5ML PO SOLN
5.0000 mL | Freq: Three times a day (TID) | ORAL | 0 refills | Status: DC | PRN
  Filled 2022-08-31: qty 120, 8d supply, fill #0

## 2022-08-31 MED ORDER — PREDNISONE 10 MG PO TABS
ORAL_TABLET | ORAL | 0 refills | Status: DC
  Filled 2022-08-31: qty 21, 6d supply, fill #0

## 2022-08-31 NOTE — Assessment & Plan Note (Signed)
Post cruise.  Nonfebrile.  Prednisone and cheratussin

## 2022-08-31 NOTE — Progress Notes (Signed)
Subjective:  Patient ID: Lisa [name], female    DOB: [birth year]  Age: 47 y.o. MRN: [id number]  CC: The primary encounter diagnosis was Epigastric pain. Diagnoses of Transaminitis, Postprandial RUQ pain, Status post cholecystectomy, Elevated liver enzymes, and Bronchitis were also pertinent to this visit.   HPI EMERI ESTILL presents for  Chief Complaint  Patient presents with   Hospitalization Follow-up    ED follow up on abdominal pain   46 YR OLD female with history of GERD and morbid obesity managed with [facility] L[facility] presents for ER follow up .  Evaluated  by urgent care on April 3 for < 24 hr history of epigastric pain accompanied by N/V/D .  She   was sent to ER for rule out pancreatitis.  Lipase was normal. Pancreas normal by CT scan.  Enteritis suggested by CT with air/fluid levels and colonic wall thickening.   LIVER ENZYMES AND WBC ELEVATED. She improved with IV fluids and discharged with diagnosis of viral gastroenteritis .  Last dose of Wegovy prior to episode was 5 days prior .  Had not been having constipation.  10 days prior to April 3 had a similar episode  without vomiting (pain,  bloating and diarrhea) .  She has had continued symptoms of post prandial epigastric pain  starting < 5 minutes after eating and lasting 30 minutes  despite use of carafate one hour before eating and continued bland diet ( crackers, yogurt , bananas,  [place],  no veggies or salads) .  Stool has ranged from normal to watery .  No fevers. Or weight loss (eating a lot of bread) .    She returned on Saturday  from a [place] cruise which  ran from April 7 to April  13.  Drank several margaritas while on cruise and continued a bland diet. . Used scopolamine patch for seasickness .  Developed productive sounding cough on Sunday   S/p  cholecystectomy in October.    Outpatient Medications Prior to Visit  Medication Sig Dispense Refill   albuterol (VENTOLIN HFA) 108 (90 Base) MCG/ACT inhaler Inhale 2  puffs into the lungs every 6 (six) hours as needed for wheezing or shortness of breath. 6.7 g 0   benzonatate (TESSALON) 100 MG capsule Take 1 capsule (100 mg total) by mouth 2 (two) times daily as needed for cough. 20 capsule 0   cetirizine (ZYRTEC) 10 MG tablet Take 10 mg by mouth daily.     etonogestrel (NEXPLANON) 68 MG IMPL implant 1 each by Subdermal route once.     fluticasone (FLONASE) 50 MCG/ACT nasal spray PLACE 2 SPRAYS INTO BOTH NOSTRILS DAILY. 48 g 1   Fluticasone Furoate (ARNUITY ELLIPTA) 100 MCG/ACT AEPB Inhale 1 puff into the lungs daily. 30 each 3   omeprazole (PRILOSEC) 40 MG capsule Take 1 capsule (40 mg total) by mouth daily. 90 capsule 1   sucralfate (CARAFATE) 1 g tablet Take 1 tablet (1 g total) by mouth 4 (four) times daily. 120 tablet 1   Turmeric 400 MG CAPS Take 400 mg by mouth daily.      cholecalciferol (VITAMIN D3) 25 MCG (1000 UNIT) tablet Take 1,000 Units by mouth daily. (Patient not taking: Reported on 08/31/2022)     folic acid (FOLVITE) 1 MG tablet Take 1 tablet (1 mg total) by mouth daily. (Patient not taking: Reported on 08/31/2022) 90 tablet 1   magnesium citrate SOLN Take 1 Bottle by mouth once. (Patient not taking: Reported on 08/31/2022)  naproxen (NAPROSYN) 500 MG tablet Take 1 tablet (500 mg total) by mouth 2 (two) times daily with a meal. (Patient not taking: Reported on 08/31/2022) 20 tablet 0   ondansetron (ZOFRAN-ODT) 4 MG disintegrating tablet Take 1 tablet (4 mg total) by mouth every 8 (eight) hours as needed for nausea or vomiting. (Patient not taking: Reported on 08/31/2022) 20 tablet 0   azithromycin (ZITHROMAX) 250 MG tablet Take 2 tablets by mouth on day 1, then one tablet daily. (Patient not taking: Reported on 08/31/2022) 6 tablet 0   ciprofloxacin (CIPRO) 500 MG tablet Take 1 tablet (500 mg total) by mouth 2 (two) times daily for 7 days (Patient not taking: Reported on 08/31/2022) 14 tablet 0   cyclobenzaprine (FLEXERIL) 10 MG tablet Take 1 tablet  (10 mg total) by mouth 3 (three) times daily as needed for muscle spasms. (Patient not taking: Reported on 08/31/2022) 90 tablet 1   metFORMIN (GLUCOPHAGE-XR) 500 MG 24 hr tablet Take 1 tablet (500 mg total) by mouth daily with breakfast. (Patient not taking: Reported on 08/31/2022) 90 tablet 1   metroNIDAZOLE (FLAGYL) 500 MG tablet Take 1 tablet (500 mg total) by mouth 3 (three) times daily for 7 days (Patient not taking: Reported on 08/31/2022) 21 tablet 0   scopolamine (TRANSDERM-SCOP) 1 MG/3DAYS Place 1 patch (1.5 mg total) onto the skin every 3 (three) days. (Patient not taking: Reported on 08/31/2022) 10 patch 0   Semaglutide-Weight Management (WEGOVY) 1.7 MG/0.75ML SOAJ Inject 1.7 mg into the skin once a week. (Patient not taking: Reported on 08/31/2022) 9 mL 1   tiZANidine (ZANAFLEX) 4 MG tablet Take 1 tablet (4 mg total) by mouth at bedtime. (Patient not taking: Reported on 08/31/2022) 30 tablet 0   No facility-administered medications prior to visit.    Review of Systems;  Patient denies headache, fevers, malaise, unintentional weight loss, skin rash, eye pain, sinus congestion and sinus pain, sore throat, dysphagia,  hemoptysis , cough, dyspnea, wheezing, chest pain, palpitations, orthopnea, edema, abdominal pain, nausea, melena, diarrhea, constipation, flank pain, dysuria, hematuria, urinary  Frequency, nocturia, numbness, tingling, seizures,  Focal weakness, Loss of consciousness,  Tremor, insomnia, depression, anxiety, and suicidal ideation.      Objective:  BP 102/74   Pulse 88   Temp 98.3 F (36.8 C) (Oral)   Ht 5\' 4"  (1.626 m)   Wt 220 lb 3.2 oz (99.9 kg)   SpO2 99%   BMI 37.80 kg/m   BP Readings from Last 3 Encounters:  08/31/22 102/74  08/17/22 119/78  08/17/22 109/64    Wt Readings from Last 3 Encounters:  08/31/22 220 lb 3.2 oz (99.9 kg)  08/17/22 222 lb 10.6 oz (101 kg)  08/04/22 224 lb (101.6 kg)    Physical Exam Vitals reviewed.  Constitutional:       General: She is not in acute distress.    Appearance: Normal appearance. She is normal weight. She is not ill-appearing, toxic-appearing or diaphoretic.  HENT:     Head: Normocephalic.  Eyes:     General: No scleral icterus.       Right eye: No discharge.        Left eye: No discharge.     Conjunctiva/sclera: Conjunctivae normal.  Cardiovascular:     Rate and Rhythm: Normal rate and regular rhythm.     Heart sounds: Normal heart sounds.  Pulmonary:     Effort: Pulmonary effort is normal. No respiratory distress.     Breath sounds: Normal breath sounds.  Abdominal:     General: Bowel sounds are normal. There is no distension.     Palpations: Abdomen is soft.     Tenderness: There is abdominal tenderness in the right upper quadrant. There is no guarding or rebound.       Comments: RUQ tenderness to deep palpation  Musculoskeletal:        General: Normal range of motion.  Skin:    General: Skin is warm and dry.  Neurological:     General: No focal deficit present.     Mental Status: She is alert and oriented to person, place, and time. Mental status is at baseline.  Psychiatric:        Mood and Affect: Mood normal.        Behavior: Behavior normal.        Thought Content: Thought content normal.        Judgment: Judgment normal.     Lab Results  Component Value Date   HGBA1C 5.7 06/23/2022   HGBA1C 5.6 02/24/2022   HGBA1C 5.7 09/23/2021    Lab Results  Component Value Date   CREATININE 0.56 08/17/2022   CREATININE 0.74 06/23/2022   CREATININE 0.68 04/04/2022    Lab Results  Component Value Date   WBC 15.9 (H) 08/17/2022   HGB 15.4 (H) 08/17/2022   HCT 44.8 08/17/2022   PLT 263 08/17/2022   GLUCOSE 130 (H) 08/17/2022   CHOL 172 06/23/2022   TRIG 179.0 (H) 06/23/2022   HDL 39.60 06/23/2022   LDLDIRECT 95.0 06/23/2022   LDLCALC 96 06/23/2022   ALT 118 (H) 08/17/2022   AST 79 (H) 08/17/2022   NA 133 (L) 08/17/2022   K 3.5 08/17/2022   CL 105 08/17/2022    CREATININE 0.56 08/17/2022   BUN 17 08/17/2022   CO2 20 (L) 08/17/2022   TSH 2.03 04/04/2022   HGBA1C 5.7 06/23/2022   MICROALBUR <0.7 06/23/2022    CT ABDOMEN PELVIS W CONTRAST  Result Date: 08/17/2022 CLINICAL DATA:  Abdominal pain, acute, nonlocalized EXAM: CT ABDOMEN AND PELVIS WITH CONTRAST TECHNIQUE: Multidetector CT imaging of the abdomen and pelvis was performed using the standard protocol following bolus administration of intravenous contrast. RADIATION DOSE REDUCTION: This exam was performed according to the departmental dose-optimization program which includes automated exposure control, adjustment of the mA and/or kV according to patient size and/or use of iterative reconstruction technique. CONTRAST:  OMNIPAQUE IOHEXOL 300 MG/ML  SOLN COMPARISON:  None Available. FINDINGS: Lower chest: Included lung bases are clear.  Heart size is normal. Hepatobiliary: No focal liver abnormality is seen. Status post cholecystectomy. No biliary dilatation. Pancreas: Unremarkable. No pancreatic ductal dilatation or surrounding inflammatory changes. Spleen: Normal in size without focal abnormality. Adrenals/Urinary Tract: Unremarkable adrenal glands. Kidneys enhance symmetrically without focal lesion, stone, or hydronephrosis. Ureters are nondilated. Urinary bladder appears unremarkable for the degree of distention. Stomach/Bowel: Stomach within normal limits. Several fluid-filled nondilated loops of small bowel. There is some liquid stool in the colon with scattered air-fluid levels. Long segment mild colonic wall thickening involving the descending and proximal sigmoid colon. Normal appendix in the right lower quadrant (series 2, image 53). Vascular/Lymphatic: No significant vascular findings are present. No enlarged abdominal or pelvic lymph nodes. Reproductive: Uterus and bilateral adnexa are unremarkable. Other: Small volume free fluid within the pelvis, which may be reactive or physiologic. No  organized abdominopelvic fluid collection. No pneumoperitoneum. No abdominal wall hernia. Musculoskeletal: No acute or significant osseous findings. IMPRESSION: 1. Long segment mild colonic  wall thickening involving the descending and proximal sigmoid colon, suggestive of an infectious or inflammatory colitis. 2. Several fluid-filled nondilated loops of bowel with scattered air-fluid levels, suggestive of a nonspecific enteritis, which may be reactive. 3. Small volume free fluid within the pelvis, which may be reactive or physiologic. Electronically Signed   By: Duanne Guess D.O.   On: 08/17/2022 12:36    Assessment & Plan:  .Epigastric pain -     Lipase  Transaminitis Assessment & Plan: Elevated ALT and GGT noted during ER evaluation April 3 but liver looked ok.  Given persistent symptoms and RUQ tenderness on exam.  Need to rule out retained stone in CBD (s/p cholecystectomy Oct 2023)  with MRCP .  Repeat labs today along with viral hepatitis rule out  Orders: -     Comprehensive metabolic panel -     Hepatitis A antibody, total -     Hepatitis B surface antibody,quantitative -     Hepatitis B core antibody, total -     CBC with Differential/Platelet  Postprandial RUQ pain -     MR ABDOMEN WITH MRCP W CONTRAST; Future  Status post cholecystectomy -     MR ABDOMEN WITH MRCP W CONTRAST; Future  Elevated liver enzymes -     MR ABDOMEN WITH MRCP W CONTRAST; Future -     Hepatitis C antibody -     ANA -     Mitochondrial antibodies -     Alpha-1-antitrypsin; Future -     AntiMicrosomal Ab-Liver / Kidney  Bronchitis Assessment & Plan: Post cruise.  Nonfebrile.  Prednisone and cheratussin    Other orders -     predniSONE; Take 6 tablets (60 mg total) by mouth daily for 1 day, THEN 5 tablets (50 mg total) daily for 1 day, THEN 4 tablets (40 mg total) daily for 1 day, THEN 3 tablets (30 mg total) daily for 1 day, THEN 2 tablets (20 mg total) daily for 1 day, THEN 1 tablet (10 mg  total) daily for 1 day.  Dispense: 21 tablet; Refill: 0 -     Cheratussin AC; Take 5 mLs by mouth 3 (three) times daily as needed for cough.  Dispense: 120 mL; Refill: 0     I provided 40 minutes of face-to-face time during this encounter reviewing patient's recent ER visit, recent surgery (cholecystectomy in Oct 2023) ,  recent colonoscopy , previous  labs and imaging studies, counseling on currently addressed issues,  and post visit ordering to diagnostics and therapeutics .   Follow-up: No follow-ups on file.   Sherlene Shams, MD

## 2022-08-31 NOTE — Assessment & Plan Note (Signed)
Elevated ALT and GGT noted during ER evaluation April 3 but liver looked ok.  Given persistent symptoms and RUQ tenderness on exam.  Need to rule out retained stone in CBD (s/p cholecystectomy Oct 2023)  with MRCP .  Repeat labs today along with viral hepatitis rule out

## 2022-08-31 NOTE — Patient Instructions (Signed)
MRCP ordered to rule out retained stone in CBD  NO ALCOHOL OR TYLENOL UNTIL LIVER ENZYMES NORMALIZE  CONTINUE BLAND DIET.    PREDNISONE AND CHERATUSSING FOR VIRAL RESPIRATORY INFECTION

## 2022-09-01 ENCOUNTER — Other Ambulatory Visit: Payer: Self-pay

## 2022-09-01 ENCOUNTER — Other Ambulatory Visit: Payer: Self-pay | Admitting: Internal Medicine

## 2022-09-01 LAB — HEPATITIS B CORE ANTIBODY, TOTAL: Hep B Core Total Ab: NONREACTIVE

## 2022-09-01 LAB — HEPATITIS A ANTIBODY, TOTAL: Hepatitis A AB,Total: NONREACTIVE

## 2022-09-01 LAB — HEPATITIS B SURFACE ANTIBODY, QUANTITATIVE: Hep B S AB Quant (Post): 44 m[IU]/mL (ref >=10)

## 2022-09-01 LAB — HEPATITIS C ANTIBODY: Hepatitis C Ab: NONREACTIVE

## 2022-09-01 MED ORDER — ALPRAZOLAM 0.5 MG PO TABS
ORAL_TABLET | ORAL | 0 refills | Status: DC
  Filled 2022-09-01: qty 2, 1d supply, fill #0

## 2022-09-02 ENCOUNTER — Encounter: Payer: Self-pay | Admitting: Internal Medicine

## 2022-09-02 ENCOUNTER — Other Ambulatory Visit: Payer: Self-pay

## 2022-09-02 ENCOUNTER — Other Ambulatory Visit: Payer: Self-pay | Admitting: Internal Medicine

## 2022-09-02 DIAGNOSIS — Z9049 Acquired absence of other specified parts of digestive tract: Secondary | ICD-10-CM

## 2022-09-02 DIAGNOSIS — R1011 Right upper quadrant pain: Secondary | ICD-10-CM

## 2022-09-02 DIAGNOSIS — R748 Abnormal levels of other serum enzymes: Secondary | ICD-10-CM

## 2022-09-02 MED ORDER — AZITHROMYCIN 500 MG PO TABS
500.0000 mg | ORAL_TABLET | Freq: Every day | ORAL | 0 refills | Status: DC
  Filled 2022-09-02: qty 7, 7d supply, fill #0

## 2022-09-02 NOTE — Telephone Encounter (Signed)
Patient seen in office 08/31/22 now has green to yellow mucus denies fever and wheezing but having chest congestion and cough up mucus yellow in color asking for Z-pack.

## 2022-09-04 ENCOUNTER — Ambulatory Visit: Admission: RE | Admit: 2022-09-04 | Payer: 59 | Source: Ambulatory Visit

## 2022-09-05 ENCOUNTER — Encounter: Payer: Self-pay | Admitting: Family Medicine

## 2022-09-05 ENCOUNTER — Ambulatory Visit (INDEPENDENT_AMBULATORY_CARE_PROVIDER_SITE_OTHER): Payer: 59

## 2022-09-05 ENCOUNTER — Ambulatory Visit (INDEPENDENT_AMBULATORY_CARE_PROVIDER_SITE_OTHER): Payer: 59 | Admitting: Family Medicine

## 2022-09-05 ENCOUNTER — Telehealth: Payer: Self-pay

## 2022-09-05 ENCOUNTER — Other Ambulatory Visit: Payer: Self-pay

## 2022-09-05 VITALS — BP 126/76 | HR 64 | Temp 97.4°F | Ht 64.0 in | Wt 227.4 lb

## 2022-09-05 DIAGNOSIS — R059 Cough, unspecified: Secondary | ICD-10-CM | POA: Diagnosis not present

## 2022-09-05 DIAGNOSIS — J40 Bronchitis, not specified as acute or chronic: Secondary | ICD-10-CM

## 2022-09-05 DIAGNOSIS — R0602 Shortness of breath: Secondary | ICD-10-CM | POA: Diagnosis not present

## 2022-09-05 MED ORDER — ALBUTEROL SULFATE HFA 108 (90 BASE) MCG/ACT IN AERS
2.0000 | INHALATION_SPRAY | Freq: Four times a day (QID) | RESPIRATORY_TRACT | 0 refills | Status: DC | PRN
Start: 2022-09-05 — End: 2023-04-18
  Filled 2022-09-05: qty 6.7, 30d supply, fill #0

## 2022-09-05 MED ORDER — PREDNISONE 10 MG PO TABS
ORAL_TABLET | ORAL | 0 refills | Status: DC
Start: 2022-09-05 — End: 2022-09-14
  Filled 2022-09-05: qty 35, 9d supply, fill #0

## 2022-09-05 MED ORDER — AMOXICILLIN-POT CLAVULANATE 875-125 MG PO TABS
1.0000 | ORAL_TABLET | Freq: Two times a day (BID) | ORAL | 0 refills | Status: DC
Start: 2022-09-05 — End: 2022-09-14
  Filled 2022-09-05: qty 14, 7d supply, fill #0

## 2022-09-05 NOTE — Progress Notes (Signed)
Marikay Alar, MD Phone: [phone]  Lisa [name] is a 47 y.o. female who presents today for same-day visit.  Cough: Patient notes onset of symptoms 9 to 10 days ago.  Started with sore throat and cough.  She was treated with prednisone initially by her PCP and then they started her on azithromycin late last week.  She notes no benefit from the azithromycin.  The prednisone did help at first at the higher dosages.  She notes more chest congestion at this point.  She does note some shortness of breath with activity.  Also has had postnasal drip.  No taste or smell disturbances.  She did not test for COVID initially.  She has used her albuterol inhaler with some benefit.  Social History   Tobacco Use  Smoking Status Never  Smokeless Tobacco Never    Current Outpatient Medications on File Prior to Visit  Medication Sig Dispense Refill   ALPRAZolam (XANAX) 0.5 MG tablet Take 1 tablet by mouth 30 minutes prior to MRI for prevention of claustrophobia. May repeat if needed. 2 tablet 0   azithromycin (ZITHROMAX) 500 MG tablet Take 1 tablet (500 mg total) by mouth daily. 7 tablet 0   benzonatate (TESSALON) 100 MG capsule Take 1 capsule (100 mg total) by mouth 2 (two) times daily as needed for cough. 20 capsule 0   cetirizine (ZYRTEC) 10 MG tablet Take 10 mg by mouth daily.     cholecalciferol (VITAMIN D3) 25 MCG (1000 UNIT) tablet Take 1,000 Units by mouth daily.     etonogestrel (NEXPLANON) 68 MG IMPL implant 1 each by Subdermal route once.     fluticasone (FLONASE) 50 MCG/ACT nasal spray PLACE 2 SPRAYS INTO BOTH NOSTRILS DAILY. 48 g 1   Fluticasone Furoate (ARNUITY ELLIPTA) 100 MCG/ACT AEPB Inhale 1 puff into the lungs daily. 30 each 3   folic acid (FOLVITE) 1 MG tablet Take 1 tablet (1 mg total) by mouth daily. 90 tablet 1   guaiFENesin-codeine (CHERATUSSIN AC) 100-10 MG/5ML syrup Take 5 mLs by mouth 3 (three) times daily as needed for cough. 120 mL 0   magnesium citrate SOLN Take 1  Bottle by mouth once.     naproxen (NAPROSYN) 500 MG tablet Take 1 tablet (500 mg total) by mouth 2 (two) times daily with a meal. 20 tablet 0   omeprazole (PRILOSEC) 40 MG capsule Take 1 capsule (40 mg total) by mouth daily. 90 capsule 1   ondansetron (ZOFRAN-ODT) 4 MG disintegrating tablet Take 1 tablet (4 mg total) by mouth every 8 (eight) hours as needed for nausea or vomiting. 20 tablet 0   sucralfate (CARAFATE) 1 g tablet Take 1 tablet (1 g total) by mouth 4 (four) times daily. 120 tablet 1   Turmeric 400 MG CAPS Take 400 mg by mouth daily.      [DISCONTINUED] levalbuterol (XOPENEX HFA) 45 MCG/ACT inhaler Inhale 2 puffs into the lungs every 6 (six) hours as needed for wheezing. 15 g 1   No current facility-administered medications on file prior to visit.     ROS see history of present illness  Objective  Physical Exam Vitals:   09/05/22 1022  BP: 126/76  Pulse: 64  Temp: (!) 97.4 F (36.3 C)  SpO2: 99%    BP Readings from Last 3 Encounters:  09/05/22 126/76  08/31/22 102/74  08/17/22 119/78   Wt Readings from Last 3 Encounters:  09/05/22 227 lb 6.4 oz (103.1 kg)  08/31/22 220 lb 3.2 oz (99.9 kg)  08/17/22 222 lb 10.6 oz (101 kg)    Physical Exam Constitutional:      General: She is not in acute distress.    Appearance: She is not diaphoretic.  HENT:     Right Ear: Tympanic membrane normal.     Left Ear: Tympanic membrane normal.  Cardiovascular:     Rate and Rhythm: Normal rate and regular rhythm.     Heart sounds: Normal heart sounds.  Pulmonary:     Effort: Pulmonary effort is normal.     Comments: Coarse breath sounds right greater than left lung fields, no wheezing noted on lung exam though wheezing noted when patient was walking to the exam table Skin:    General: Skin is warm and dry.  Neurological:     Mental Status: She is alert.      Assessment/Plan: Please see individual problem list.  Bronchitis Assessment & Plan: Acute issue.  Symptoms  likely bronchitis related though she potentially could have an underlying pneumonia.  Will check a chest x-ray today.  We will start Augmentin 1 tablet twice daily for 7 days.  She will finish the azithromycin.  We will give her another course of prednisone 50 mg daily for 5 days and then taper over the next 4 days.  She will be prescribed an albuterol inhaler that is up-to-date and she can use this every 6 hours for the next 2 days and then every 6 hours as needed.  Advised to seek medical attention if she develops cough productive of blood or worsening shortness of breath or any worsening symptoms.  She will contact us if she starts having fevers.  She was also counseled on monitoring for sleep issues and agitation with the prednisone.  She will monitor for excessive diarrhea with the Augmentin.  Orders: -     predniSONE; Take 5 tablets (50 mg total) by mouth daily with breakfast for 5 days, THEN 4 tablets (40 mg total) daily with breakfast for 1 day, THEN 3 tablets (30 mg total) daily with breakfast for 1 day, THEN 2 tablets (20 mg total) daily with breakfast for 1 day, THEN 1 tablet (10 mg total) daily with breakfast for 1 day.  Dispense: 35 tablet; Refill: 0 -     Amoxicillin-Pot Clavulanate; Take 1 tablet by mouth 2 (two) times daily.  Dispense: 14 tablet; Refill: 0 -     Albuterol Sulfate HFA; Inhale 2 puffs into the lungs every 6 (six) hours as needed for wheezing or shortness of breath.  Dispense: 6.7 g; Refill: 0 -     DG Chest 2 View; Future     Return if symptoms worsen or fail to improve.   Marikay Alar, MD [facility]

## 2022-09-05 NOTE — Telephone Encounter (Signed)
noted 

## 2022-09-05 NOTE — Assessment & Plan Note (Addendum)
Acute issue.  Symptoms likely bronchitis related though she potentially could have an underlying pneumonia.  Will check a chest x-ray today.  We will start Augmentin 1 tablet twice daily for 7 days.  She will finish the azithromycin.  We will give her another course of prednisone 50 mg daily for 5 days and then taper over the next 4 days.  She will be prescribed an albuterol inhaler that is up-to-date and she can use this every 6 hours for the next 2 days and then every 6 hours as needed.  Advised to seek medical attention if she develops cough productive of blood or worsening shortness of breath or any worsening symptoms.  She will contact us if she starts having fevers.  She was also counseled on monitoring for sleep issues and agitation with the prednisone.  She will monitor for excessive diarrhea with the Augmentin.

## 2022-09-05 NOTE — Telephone Encounter (Signed)
See telephone encounter.

## 2022-09-05 NOTE — Patient Instructions (Addendum)
Nice to see you. I am going to treat you with prednisone for more prolonged taper.  Will also put you on Augmentin.  Please monitor for excessive diarrhea with the Augmentin.  Please also monitor for significant sleep issues or agitation with the prednisone. You can use the albuterol inhaler 2 puffs every 6 hours for the next 2 days and then every 6 hours as needed. We will contact you with your chest x-ray result. If you develop worsening symptoms, call productive of blood, worsening shortness of breath please seek medical attention immediately.  If you develop fever please contact us right away.

## 2022-09-05 NOTE — Telephone Encounter (Signed)
Curlie K Catalano  P [facility] (supporting Sherlene Shams, MD)1 hour ago (8:29 AM)    Still feeling bad. Wheezing, sob, and lots of stuff in my chest. Finished my prednisone taper this morning and took 4 dose of zithromyocin. Nurse listened to me and have congestion lower lobes and sound constricted. How should I proceed. Thanks [employer]

## 2022-09-07 ENCOUNTER — Other Ambulatory Visit: Payer: Self-pay

## 2022-09-07 ENCOUNTER — Encounter: Payer: Self-pay | Admitting: *Deleted

## 2022-09-07 LAB — ANA: Anti Nuclear Antibody (ANA): NEGATIVE

## 2022-09-07 LAB — MITOCHONDRIAL ANTIBODIES: Mitochondrial M2 Ab, IgG: <=20 U (ref <=20.0)

## 2022-09-07 LAB — ANTI-MICROSOMAL ANTIBODY LIVER / KIDNEY: LKM1 Ab: <=20 U (ref <=20.0)

## 2022-09-08 ENCOUNTER — Ambulatory Visit: Admission: RE | Admit: 2022-09-08 | Payer: 59 | Source: Ambulatory Visit

## 2022-09-09 ENCOUNTER — Other Ambulatory Visit: Payer: Self-pay

## 2022-09-13 NOTE — Telephone Encounter (Signed)
Spoke with pt today & she is still tired & some SOB. Using inhaler & have appt with pulminoloy tomorrow with Dr. Jayme Cloud.  Overall feels better than she did at her last visit

## 2022-09-13 NOTE — Telephone Encounter (Signed)
Noted. I am glad she is going to see pulmonology.

## 2022-09-14 ENCOUNTER — Ambulatory Visit
Admission: RE | Admit: 2022-09-14 | Discharge: 2022-09-14 | Disposition: A | Discharge status: Home / Self Care | Payer: 59 | Source: Ambulatory Visit | Attending: Pulmonary Disease | Admitting: Pulmonary Disease

## 2022-09-14 ENCOUNTER — Ambulatory Visit
Admission: RE | Admit: 2022-09-14 | Discharge: 2022-09-14 | Disposition: A | Discharge status: Home / Self Care | Payer: 59 | Attending: Pulmonary Disease | Admitting: Pulmonary Disease

## 2022-09-14 ENCOUNTER — Other Ambulatory Visit: Payer: Self-pay

## 2022-09-14 ENCOUNTER — Encounter: Payer: Self-pay | Admitting: Pulmonary Disease

## 2022-09-14 ENCOUNTER — Ambulatory Visit: Payer: 59 | Admitting: Pulmonary Disease

## 2022-09-14 ENCOUNTER — Other Ambulatory Visit
Admission: RE | Admit: 2022-09-14 | Discharge: 2022-09-14 | Disposition: A | Discharge status: Home / Self Care | Payer: 59 | Source: Home / Self Care | Attending: Pulmonary Disease | Admitting: Pulmonary Disease

## 2022-09-14 VITALS — BP 118/78 | HR 97 | Temp 97.3°F | Ht 64.0 in | Wt 233.4 lb

## 2022-09-14 DIAGNOSIS — J386 Stenosis of larynx: Secondary | ICD-10-CM | POA: Diagnosis not present

## 2022-09-14 DIAGNOSIS — J069 Acute upper respiratory infection, unspecified: Secondary | ICD-10-CM

## 2022-09-14 DIAGNOSIS — R0602 Shortness of breath: Secondary | ICD-10-CM

## 2022-09-14 DIAGNOSIS — K219 Gastro-esophageal reflux disease without esophagitis: Secondary | ICD-10-CM | POA: Diagnosis not present

## 2022-09-14 DIAGNOSIS — Z1152 Encounter for screening for COVID-19: Secondary | ICD-10-CM | POA: Insufficient documentation

## 2022-09-14 LAB — RESP PANEL BY RT-PCR (RSV, FLU A&B, COVID)  RVPGX2
Influenza A by PCR: NEGATIVE
Influenza B by PCR: NEGATIVE
Resp Syncytial Virus by PCR: NEGATIVE
SARS Coronavirus 2 by RT PCR: NEGATIVE

## 2022-09-14 MED ORDER — TRELEGY ELLIPTA 100-62.5-25 MCG/ACT IN AEPB
1.0000 | INHALATION_SPRAY | Freq: Every day | RESPIRATORY_TRACT | 11 refills | Status: DC
  Filled 2022-09-14: qty 60, 30d supply, fill #0
  Filled 2022-10-26: qty 60, 30d supply, fill #1
  Filled 2022-11-20: qty 60, 30d supply, fill #2
  Filled 2022-12-22: qty 60, 30d supply, fill #3
  Filled 2023-01-20: qty 60, 30d supply, fill #4
  Filled 2023-02-19 – 2023-04-15 (×4): qty 60, 30d supply, fill #5
  Filled 2023-05-12: qty 60, 30d supply, fill #6
  Filled 2023-06-15: qty 60, 30d supply, fill #7
  Filled 2023-07-20: qty 60, 30d supply, fill #8
  Filled 2023-08-02 – 2023-08-22 (×2): qty 60, 30d supply, fill #9

## 2022-09-14 MED ORDER — TRELEGY ELLIPTA 100-62.5-25 MCG/ACT IN AEPB: 1.0000 | INHALATION_SPRAY | Freq: Every day | RESPIRATORY_TRACT | 0 refills | Status: DC

## 2022-09-14 NOTE — Patient Instructions (Signed)
We are doing a test to check for COVID and other viruses.  We are giving you a trial of an inhaler called Trelegy Ellipta this is 1 puff daily.  Make sure you rinse your mouth well after you use it.  You can still use your as needed albuterol.  We are going to get a chest x-ray to make sure there are no changes in the lung.  We have put in an order for breathing tests but these will be done closer to follow-up here.  Will see you in follow-up in 6 to 8 weeks time call sooner should any new problems arise.

## 2022-09-14 NOTE — Progress Notes (Signed)
Subjective:    Patient ID: Lisa [name], female    DOB: July 04, 1975, 47 y.o.   MRN: [id number] Patient Care Team: Sherlene Shams, MD as PCP - General (Internal Medicine)  Chief Complaint  Patient presents with   Follow-up    Symptoms started on 08/27/2022. Sore throat, cough with green sputum, SOB with exertion, fatigue, and wheezing.    HPI Lisa [name] is a 47 year old lifelong never smoker with a prior history of subglottic stenosis status tracheal dilation in 2014 and redo dilation in 2021 by Dr. Christia Reading, resents for evaluation of recurrent dyspnea on exertion.  I initially saw this patient in June 2023 for a similar issue post an episode of bronchitis.  Notices dyspnea on exertion fatigue and wheezing after she developed a sore throat and cough on 13 April.  She had just come back from a cruise at that point.  She was treated with prednisone on 17 April and subsequently developed discoloration of the sputum and was treated with azithromycin on 19 April.  This was followed by a round of Augmentin and more prednisone which she completed on the 29th.  She has not had any fevers, chills or sweats.  Was not studied for COVID-19.  Previously we had provided her with Arnuity Ellipta however she discontinued this a week ago because she felt that this was not effective.  As noted she has a history of subglottic stenosis and has required tracheal dilation previously.  After her episode in  June 23, she did see Dr. Jenne Pane again and he thought that reflux was causing some of her symptoms.  He did not see evidence of recurrent stenosis.  With regards to her cough this is improving after antibiotics.  No hemoptysis.  Her main complaint is that of shortness of breath with exertion fatigue and wheezing and difficulty wearing a surgical mask which she is required to use at work.  Her gastroesophageal reflux symptoms are controlled.  He does not endorse any other symptomatology.  No fevers chills or  sweats.  Review of Systems A 10 point review of systems was performed and it is as noted above otherwise negative.  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   Family history of colon cancer requiring screening colonoscopy 02/24/2022   Trigger point of left shoulder region 10/06/2021   Cervical radiculitis 03/03/2020   Weakness of left arm 03/03/2020   Cervicalgia 02/28/2020   Spondylosis, cervical, 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   Loud snoring 08/01/2019   B12 deficiency 08/01/2019   Eczema of left external ear 08/01/2019   Hyperlipidemia associated with type 2 diabetes mellitus (HCC) 07/31/2019   Nonallopathic lesion of cervical region 04/29/2019   Nonallopathic lesion of thoracic region 04/29/2019   Nonallopathic lesion of rib cage 04/29/2019   Cervical radiculopathy at C7 03/26/2019   Allergic rhinitis due to pollen 08/18/2018   Type 2 diabetes mellitus without complications (HCC) 05/02/2017   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   Encounter for screening colonoscopy 04/29/2016   Low back pain without sciatica 04/28/2015   Morbid obesity (HCC) 04/28/2015   Social History   Tobacco Use   Smoking status: Never   Smokeless tobacco: Never  Substance  Use Topics   Alcohol use: Yes    Alcohol/week: 2.0 standard drinks of alcohol    Types: 1 Glasses of wine, 1 Cans of beer per week    Comment: "social"   Allergies  Allergen Reactions   Chlorhexidine Gluconate     Developed pruritic rash on abdomen from prep for lap chole Mar 09 2022   Latex Hives    Rash and hives   Current Meds  Medication Sig   albuterol (VENTOLIN HFA) 108  (90 Base) MCG/ACT inhaler Inhale 2 puffs into the lungs every 6 (six) hours as needed for wheezing or shortness of breath.   ALPRAZolam (XANAX) 0.5 MG tablet Take 1 tablet by mouth 30 minutes prior to MRI for prevention of claustrophobia. May repeat if needed.   cetirizine (ZYRTEC) 10 MG tablet Take 10 mg by mouth daily.   cholecalciferol (VITAMIN D3) 25 MCG (1000 UNIT) tablet Take 1,000 Units by mouth daily.   etonogestrel (NEXPLANON) 68 MG IMPL implant 1 each by Subdermal route once.   fluticasone (FLONASE) 50 MCG/ACT nasal spray PLACE 2 SPRAYS INTO BOTH NOSTRILS DAILY.   folic acid (FOLVITE) 1 MG tablet Take 1 tablet (1 mg total) by mouth daily.   omeprazole (PRILOSEC) 40 MG capsule Take 1 capsule (40 mg total) by mouth daily.   ondansetron (ZOFRAN-ODT) 4 MG disintegrating tablet Take 1 tablet (4 mg total) by mouth every 8 (eight) hours as needed for nausea or vomiting.   sucralfate (CARAFATE) 1 g tablet Take 1 tablet (1 g total) by mouth 4 (four) times daily.   Turmeric 400 MG CAPS Take 400 mg by mouth daily.    Immunization History  Administered Date(s) Administered   Influenza Split 02/25/2015, 01/30/2017   Influenza-Unspecified 02/09/2016, 01/21/2018, 02/12/2019, 03/13/2020, 02/10/2021, 02/10/2022   Moderna Sars-Covid-2 Vaccination 05/06/2019, 05/27/2019, 05/12/2020   PNEUMOCOCCAL CONJUGATE-20 01/07/2021   Tdap 04/27/2016      Objective:   Physical Exam BP 118/78 (BP Location: Left Arm, Cuff Size: Large)   Pulse 97   Temp (!) 97.3 F (36.3 C)   Ht 5\' 4"  (1.626 m)   Wt 233 lb 6.4 oz (105.9 kg)   SpO2 97%   BMI 40.06 kg/m   SpO2: 97 % O2 Device: None (Room air)  GENERAL: Obese woman, no acute distress, fully ambulatory.  No conversational dyspnea. HEAD: Normocephalic, atraumatic.  EYES: Pupils equal, round, reactive to light. No scleral icterus.  MOUTH: Nose/mouth/throat not examined due to institutional masking requirements. NECK: Supple. No thyromegaly. Trachea midline.  No JVD. No adenopathy.  No stridor. PULMONARY: Good air entry bilaterally. No adventitious sounds. CARDIOVASCULAR: S1 and S2. Regular rate and rhythm.  ABDOMEN: Obese, otherwise benign. MUSCULOSKELETAL: No joint deformity, no clubbing, no edema.  NEUROLOGIC: No overt focal deficit, no gait disturbance, speech is fluent. SKIN: Intact,warm,dry. PSYCH: Mood and behavior normal.      Assessment & Plan:     ICD-10-CM   1. Upper respiratory tract infection, unspecified type  J06.9 Resp panel by RT-PCR (RSV, Flu A&B, Covid) Anterior Nasal Swab    DG Chest 2 View   Appears to be resolving Query reactive airways post infection Trial of Trelegy Ellipta    2. Shortness of breath  R06.02 DG Chest 2 View    Pulmonary Function Test [facility] Only   Obtain PFTs Chest x-ray PA and lateral Trial of Trelegy as above    3. Subglottic stenosis  J38.6    Status post dilation 2014 and 2021 Flow-volume loop should help  reassess No stridor noted today    4. Gastroesophageal reflux disease, unspecified whether esophagitis present  K21.9    Patient compliant with PPI Compliant with Carafate Symptoms controlled     Orders Placed This Encounter  Procedures   Resp panel by RT-PCR (RSV, Flu A&B, Covid) Anterior Nasal Swab    Standing Status:   Future    Number of Occurrences:   1    Standing Expiration Date:   03/17/2023   DG Chest 2 View    Standing Status:   Future    Number of Occurrences:   1    Standing Expiration Date:   09/14/2023    Order Specific Question:   Reason for Exam (SYMPTOM  OR DIAGNOSIS REQUIRED)    Answer:   Shortness of breath    Order Specific Question:   Is patient pregnant?    Answer:   No    Order Specific Question:   Preferred imaging location?    Answer:   [facility]   Pulmonary Function Test [facility] Only    Standing Status:   Future    Standing Expiration Date:   09/14/2023    Order Specific Question:   Full PFT: includes the following: basic spirometry, spirometry pre  & post bronchodilator, diffusion capacity (DLCO), lung volumes    Answer:   Full PFT    Order Specific Question:   This test can only be performed at    Answer:   [facility]   Meds ordered this encounter  Medications   Fluticasone-Umeclidin-Vilant (TRELEGY ELLIPTA) 100-62.5-25 MCG/ACT AEPB    Sig: Inhale 1 puff into the lungs daily.    Dispense:  60 each    Refill:  11   Fluticasone-Umeclidin-Vilant (TRELEGY ELLIPTA) 100-62.5-25 MCG/ACT AEPB    Sig: Inhale 1 puff into the lungs daily.    Dispense:  14 each    Refill:  0    Order Specific Question:   Lot Number?    Answer:   [id number]    Order Specific Question:   Expiration Date?    Answer:   12/15/2023    Order Specific Question:   Quantity    Answer:   1   Will see the patient in follow-up in 6 to 8 weeks time she is to call sooner should any new problems arise.  Gailen Shelter, MD [facility]ch[facility]  [facility]    *This note was dictated using voice recognition software/Dragon.  Despite best efforts to proofread, errors can occur which can change the meaning. Any transcriptional errors that result from this process are unintentional and may not be fully corrected at the time of dictation.

## 2022-09-16 NOTE — Progress Notes (Unsigned)
[name] [address] [place] [address] Phone: [phone] Subjective:   Lisa [name], am serving as a scribe for Dr. Antoine Primas.  I'm seeing this patient by the request  of:  Lisa Shams, MD  CC: neck and back pain   Lisa [name]  Lisa [name] is a 47 y.o. female coming in with complaint of back and neck pain. OMT 06/22/2022. Patient states that she has not been using her arm as much after having pneumonia. Has some soreness on top of the shoulder.   Medications patient has been prescribed: None  Taking:         Reviewed prior external information including notes and imaging from previsou exam, outside providers and external EMR if available.   As well as notes that were available from care everywhere and other healthcare systems.  Past medical history, social, surgical and family history all reviewed in electronic medical record.  No pertanent information unless stated regarding to the chief complaint.   Past Medical History:  Diagnosis Date   Bronchitis    hx of   Cervical radiculitis 03/03/2020   Complication of anesthesia    COVID-19 10/2020   DDD (degenerative disc disease), cervical 02/28/2020   Diabetes mellitus without complication (HCC)    Dysrhythmia    "does not see cardiologist"   Family history of anesthesia complication    "morphine night terrors"   GERD (gastroesophageal reflux disease)    Headache(784.0)    "migraines"   Hypertension    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 and 2021   PONV (postoperative nausea and vomiting)    Pre-syncope 07/14/2014   Shortness of breath    "wheezing"   Weakness of left arm 03/03/2020    Allergies  Allergen Reactions   Chlorhexidine Gluconate     Developed pruritic rash on abdomen from prep for lap chole Mar 09 2022   Latex Hives    Rash and hives     Review of Systems:  No headache, visual changes, nausea,  vomiting, diarrhea, constipation, dizziness, abdominal pain, skin rash, fevers, chills, night sweats, weight loss, swollen lymph nodes, body aches, joint swelling, chest pain, shortness of breath, mood changes. POSITIVE muscle aches  Objective  Blood pressure 112/82, pulse (!) 55, height 5\' 4"  (1.626 m), weight 230 lb (104.3 kg), SpO2 99 %.   General: No apparent distress alert and oriented x3 mood and affect normal, dressed appropriately.  HEENT: Pupils equal, extraocular movements intact  Respiratory: Patient's speak in full sentences and does not appear short of breath  Cardiovascular: No lower extremity edema, non tender, no erythema  Neck and back pain noted.  Patient does have significant loss of lordosis of the neck noted.  Significant tightness in the left parascapular area.  Positive tenderness to palpation in this area as well.  Osteopathic findings  C2 flexed rotated and side bent right C6 flexed rotated and side bent left T3 extended rotated and side bent left inhaled rib T7 extended rotated and side bent left L2 flexed rotated and side bent right Sacrum right on right       Assessment and Plan:  Chronic bilateral thoracic back pain Has had chronic problem for some time.  Cyclic syndrome is also in the differential.  Does have some tightness noted with some tenderness.  Discussed icing regimen and home exercises, discussed which activities to do and which ones to avoid.   Will get x-rays  to further evaluate for any cervical radiculopathy that could be potentially contributing.  Also because of left shoulder discomfort will get x-rays follow-up with me again in 5 to 6 weeks    Nonallopathic problems  Decision today to treat with OMT was based on Physical Exam  After verbal consent patient was treated with HVLA, ME, FPR techniques in cervical, rib, thoracic, lumbar, and sacral  areas  Patient tolerated the procedure well with improvement in symptoms  Patient given  exercises, stretches and lifestyle modifications  See medications in patient instructions if given  Patient will follow up in 4-8 weeks    The above documentation has been reviewed and is accurate and complete [name], DO          Note: This dictation was prepared with Dragon dictation along with smaller phrase technology. Any transcriptional errors that result from this process are unintentional.

## 2022-09-18 ENCOUNTER — Ambulatory Visit
Admission: RE | Admit: 2022-09-18 | Discharge: 2022-09-18 | Disposition: A | Discharge status: Home / Self Care | Payer: 59 | Source: Ambulatory Visit | Attending: Internal Medicine | Admitting: Internal Medicine

## 2022-09-18 DIAGNOSIS — Z9049 Acquired absence of other specified parts of digestive tract: Secondary | ICD-10-CM | POA: Diagnosis not present

## 2022-09-18 DIAGNOSIS — R748 Abnormal levels of other serum enzymes: Secondary | ICD-10-CM | POA: Diagnosis not present

## 2022-09-18 DIAGNOSIS — R1011 Right upper quadrant pain: Secondary | ICD-10-CM | POA: Diagnosis not present

## 2022-09-18 DIAGNOSIS — R935 Abnormal findings on diagnostic imaging of other abdominal regions, including retroperitoneum: Secondary | ICD-10-CM | POA: Diagnosis not present

## 2022-09-18 MED ORDER — GADOBUTROL 1 MMOL/ML IV SOLN
10.0000 mL | Freq: Once | INTRAVENOUS | Status: AC | PRN
  Administered 2022-09-18: 10 mL via INTRAVENOUS

## 2022-09-19 ENCOUNTER — Encounter: Payer: Self-pay | Admitting: Family Medicine

## 2022-09-19 ENCOUNTER — Ambulatory Visit: Payer: 59 | Admitting: Family Medicine

## 2022-09-19 ENCOUNTER — Ambulatory Visit (INDEPENDENT_AMBULATORY_CARE_PROVIDER_SITE_OTHER): Payer: 59

## 2022-09-19 VITALS — BP 112/82 | HR 55 | Ht 64.0 in | Wt 230.0 lb

## 2022-09-19 DIAGNOSIS — M9902 Segmental and somatic dysfunction of thoracic region: Secondary | ICD-10-CM | POA: Diagnosis not present

## 2022-09-19 DIAGNOSIS — M9908 Segmental and somatic dysfunction of rib cage: Secondary | ICD-10-CM

## 2022-09-19 DIAGNOSIS — M9901 Segmental and somatic dysfunction of cervical region: Secondary | ICD-10-CM

## 2022-09-19 DIAGNOSIS — M25512 Pain in left shoulder: Secondary | ICD-10-CM | POA: Diagnosis not present

## 2022-09-19 DIAGNOSIS — M9904 Segmental and somatic dysfunction of sacral region: Secondary | ICD-10-CM | POA: Diagnosis not present

## 2022-09-19 DIAGNOSIS — G8929 Other chronic pain: Secondary | ICD-10-CM

## 2022-09-19 DIAGNOSIS — M546 Pain in thoracic spine: Secondary | ICD-10-CM | POA: Diagnosis not present

## 2022-09-19 DIAGNOSIS — M50321 Other cervical disc degeneration at C4-C5 level: Secondary | ICD-10-CM | POA: Diagnosis not present

## 2022-09-19 DIAGNOSIS — M542 Cervicalgia: Secondary | ICD-10-CM

## 2022-09-19 DIAGNOSIS — M9903 Segmental and somatic dysfunction of lumbar region: Secondary | ICD-10-CM | POA: Diagnosis not present

## 2022-09-19 DIAGNOSIS — M50322 Other cervical disc degeneration at C5-C6 level: Secondary | ICD-10-CM | POA: Diagnosis not present

## 2022-09-19 NOTE — Patient Instructions (Signed)
Good to see you Stop coughing See me in 5-6 weeks

## 2022-09-19 NOTE — Assessment & Plan Note (Signed)
Has had chronic problem for some time.  Cyclic syndrome is also in the differential.  Does have some tightness noted with some tenderness.  Discussed icing regimen and home exercises, discussed which activities to do and which ones to avoid.   Will get x-rays to further evaluate for any cervical radiculopathy that could be potentially contributing.  Also because of left shoulder discomfort will get x-rays follow-up with me again in 5 to 6 weeks

## 2022-09-21 ENCOUNTER — Other Ambulatory Visit: Payer: Self-pay

## 2022-09-23 ENCOUNTER — Ambulatory Visit: Payer: 59 | Admitting: Internal Medicine

## 2022-09-26 ENCOUNTER — Other Ambulatory Visit: Payer: Self-pay

## 2022-09-26 ENCOUNTER — Ambulatory Visit (INDEPENDENT_AMBULATORY_CARE_PROVIDER_SITE_OTHER): Payer: 59 | Admitting: Internal Medicine

## 2022-09-26 ENCOUNTER — Encounter: Payer: Self-pay | Admitting: Internal Medicine

## 2022-09-26 DIAGNOSIS — R5381 Other malaise: Secondary | ICD-10-CM

## 2022-09-26 DIAGNOSIS — J42 Unspecified chronic bronchitis: Secondary | ICD-10-CM | POA: Diagnosis not present

## 2022-09-26 DIAGNOSIS — Z6839 Body mass index (BMI) 39.0-39.9, adult: Secondary | ICD-10-CM | POA: Diagnosis not present

## 2022-09-26 DIAGNOSIS — K2970 Gastritis, unspecified, without bleeding: Secondary | ICD-10-CM | POA: Insufficient documentation

## 2022-09-26 DIAGNOSIS — K2900 Acute gastritis without bleeding: Secondary | ICD-10-CM | POA: Diagnosis not present

## 2022-09-26 DIAGNOSIS — R7401 Elevation of levels of liver transaminase levels: Secondary | ICD-10-CM | POA: Diagnosis not present

## 2022-09-26 MED ORDER — SEMAGLUTIDE-WEIGHT MANAGEMENT 0.5 MG/0.5ML ~~LOC~~ SOAJ
0.5000 mg | SUBCUTANEOUS | 2 refills | Status: AC
  Filled 2022-09-26: qty 2, 28d supply, fill #0

## 2022-09-26 NOTE — Progress Notes (Signed)
Subjective:  Patient ID: Lisa [name], female    DOB: 07-11-1975  Age: 47 y.o. MRN: [id number]  CC: The primary encounter diagnosis was Morbid obesity (HCC). Diagnoses of Transaminitis, Chronic bronchitis, unspecified chronic bronchitis type (HCC), Physical deconditioning, and Acute gastritis without hemorrhage, unspecified gastritis type were also pertinent to this visit.   HPI Lisa [name] presents for  Chief Complaint  Patient presents with   Medical Management of Chronic Issues    Follow up on weight loss medication   1) obesity:  lost 70 lbs with Wegovy from     to Oct 2023 .  Last dose was in March .  Has 1.7 mg dose left and the 2.4    2) gastritis :  now resolved  no longer on sucralfate.  3) bronchitis;  still having SOB with exertion  managed with albuterol and rest. .  Not exercising ,  walking makes her short of breath .  Has a treadmill.    4) Trilegy prescribed by Jayme Cloud   5) shoulder x rayed by zack smith  tendonopathy  and degenerative changes.  Home PT ordered   Outpatient Medications Prior to Visit  Medication Sig Dispense Refill   albuterol (VENTOLIN HFA) 108 (90 Base) MCG/ACT inhaler Inhale 2 puffs into the lungs every 6 (six) hours as needed for wheezing or shortness of breath. 6.7 g 0   cetirizine (ZYRTEC) 10 MG tablet Take 10 mg by mouth daily.     cholecalciferol (VITAMIN D3) 25 MCG (1000 UNIT) tablet Take 1,000 Units by mouth daily.     etonogestrel (NEXPLANON) 68 MG IMPL implant 1 each by Subdermal route once.     Fluticasone-Umeclidin-Vilant (TRELEGY ELLIPTA) 100-62.5-25 MCG/ACT AEPB Inhale 1 puff into the lungs daily. 60 each 11   folic acid (FOLVITE) 1 MG tablet Take 1 tablet (1 mg total) by mouth daily. 90 tablet 1   omeprazole (PRILOSEC) 40 MG capsule Take 1 capsule (40 mg total) by mouth daily. 90 capsule 1   Turmeric 400 MG CAPS Take 400 mg by mouth daily.      ALPRAZolam (XANAX) 0.5 MG tablet Take 1 tablet by mouth 30 minutes prior to  MRI for prevention of claustrophobia. May repeat if needed. (Patient not taking: Reported on 09/26/2022) 2 tablet 0   benzonatate (TESSALON) 100 MG capsule Take 1 capsule (100 mg total) by mouth 2 (two) times daily as needed for cough. (Patient not taking: Reported on 09/26/2022) 20 capsule 0   fluticasone (FLONASE) 50 MCG/ACT nasal spray PLACE 2 SPRAYS INTO BOTH NOSTRILS DAILY. (Patient not taking: Reported on 09/26/2022) 48 g 1   Fluticasone-Umeclidin-Vilant (TRELEGY ELLIPTA) 100-62.5-25 MCG/ACT AEPB Inhale 1 puff into the lungs daily. 14 each 0   guaiFENesin-codeine (CHERATUSSIN AC) 100-10 MG/5ML syrup Take 5 mLs by mouth 3 (three) times daily as needed for cough. (Patient not taking: Reported on 09/26/2022) 120 mL 0   ondansetron (ZOFRAN-ODT) 4 MG disintegrating tablet Take 1 tablet (4 mg total) by mouth every 8 (eight) hours as needed for nausea or vomiting. (Patient not taking: Reported on 09/26/2022) 20 tablet 0   sucralfate (CARAFATE) 1 g tablet Take 1 tablet (1 g total) by mouth 4 (four) times daily. (Patient not taking: Reported on 09/26/2022) 120 tablet 1   No facility-administered medications prior to visit.    Review of Systems;  Patient denies headache, fevers, malaise, unintentional weight loss, skin rash, eye pain, sinus congestion and sinus pain, sore throat, dysphagia,  hemoptysis , cough,  dyspnea, wheezing, chest pain, palpitations, orthopnea, edema, abdominal pain, nausea, melena, diarrhea, constipation, flank pain, dysuria, hematuria, urinary  Frequency, nocturia, numbness, tingling, seizures,  Focal weakness, Loss of consciousness,  Tremor, insomnia, depression, anxiety, and suicidal ideation.      Objective:  BP 114/76   Pulse 69   Temp 98.1 F (36.7 C) (Oral)   Ht 5\' 4"  (1.626 m)   Wt 231 lb (104.8 kg)   LMP 09/05/2022 (Approximate)   SpO2 98%   BMI 39.65 kg/m   BP Readings from Last 3 Encounters:  09/26/22 114/76  09/19/22 112/82  09/14/22 118/78    Wt Readings  from Last 3 Encounters:  09/26/22 231 lb (104.8 kg)  09/19/22 230 lb (104.3 kg)  09/14/22 233 lb 6.4 oz (105.9 kg)    Physical Exam Vitals reviewed.  Constitutional:      General: She is not in acute distress.    Appearance: Normal appearance. She is normal weight. She is not ill-appearing, toxic-appearing or diaphoretic.  HENT:     Head: Normocephalic.  Eyes:     General: No scleral icterus.       Right eye: No discharge.        Left eye: No discharge.     Conjunctiva/sclera: Conjunctivae normal.  Cardiovascular:     Rate and Rhythm: Normal rate and regular rhythm.     Heart sounds: Normal heart sounds.  Pulmonary:     Effort: Pulmonary effort is normal. No respiratory distress.     Breath sounds: Normal breath sounds.  Musculoskeletal:        General: Normal range of motion.  Skin:    General: Skin is warm and dry.  Neurological:     General: No focal deficit present.     Mental Status: She is alert and oriented to person, place, and time. Mental status is at baseline.  Psychiatric:        Mood and Affect: Mood normal.        Behavior: Behavior normal.        Thought Content: Thought content normal.        Judgment: Judgment normal.    Lab Results  Component Value Date   HGBA1C 5.7 06/23/2022   HGBA1C 5.6 02/24/2022   HGBA1C 5.7 09/23/2021    Lab Results  Component Value Date   CREATININE 0.65 08/31/2022   CREATININE 0.56 08/17/2022   CREATININE 0.74 06/23/2022    Lab Results  Component Value Date   WBC 6.8 08/31/2022   HGB 14.0 08/31/2022   HCT 41.9 08/31/2022   PLT 211.0 08/31/2022   GLUCOSE 92 08/31/2022   CHOL 172 06/23/2022   TRIG 179.0 (H) 06/23/2022   HDL 39.60 06/23/2022   LDLDIRECT 95.0 06/23/2022   LDLCALC 96 06/23/2022   ALT 33 08/31/2022   AST 27 08/31/2022   NA 141 08/31/2022   K 4.0 08/31/2022   CL 108 08/31/2022   CREATININE 0.65 08/31/2022   BUN 11 08/31/2022   CO2 23 08/31/2022   TSH 2.03 04/04/2022   HGBA1C 5.7 06/23/2022    MICROALBUR <0.7 06/23/2022    DG Shoulder Left  Result Date: 09/22/2022 CLINICAL DATA:  Left shoulder pain and neck pain. EXAM: LEFT SHOULDER - 2+ VIEW COMPARISON:  None Available. FINDINGS: No appreciable degenerative changes in the acromioclavicular or glenohumeral joints. Tiny calcific densities project over the lateral humeral head. Visualized left chest is grossly unremarkable. IMPRESSION: Calcifications over the lateral humeral head are indicative of rotator cuff tendinopathy. Electronically  Signed   By: Leanna Battles M.D.   On: 09/22/2022 13:14   DG Cervical Spine Complete  Result Date: 09/20/2022 CLINICAL DATA:  Chronic left shoulder pain. EXAM: CERVICAL SPINE - COMPLETE 4+ VIEW COMPARISON:  None Available. FINDINGS: Visualization through C7 superior endplate. Straightening of the normal cervical lordosis. Multilevel degenerative disc disease most pronounced C4-5 and C5-6. Anterior endplate osteophytosis at the C5-6 level. Prevertebral soft tissues unremarkable. No definite osseous neural foraminal narrowing. Lung apices are clear. Lateral masses articulate appropriately with the dens. IMPRESSION: Degenerative disc disease most pronounced C4-5 and C5-6. Electronically Signed   By: Annia Belt M.D.   On: 09/20/2022 22:11   MR ABDOMEN MRCP W WO CONTAST  Result Date: 09/18/2022 CLINICAL DATA:  Right upper quadrant pain. Elevated liver function tests. Prior cholecystectomy. EXAM: MRI ABDOMEN WITHOUT AND WITH CONTRAST (INCLUDING MRCP) TECHNIQUE: Multiplanar multisequence MR imaging of the abdomen was performed both before and after the administration of intravenous contrast. Heavily T2-weighted images of the biliary and pancreatic ducts were obtained, and three-dimensional MRCP images were rendered by post processing. CONTRAST:  10mL GADAVIST GADOBUTROL 1 MMOL/ML IV SOLN COMPARISON:  CT on 08/17/2022 FINDINGS: Lower chest: No acute findings. Hepatobiliary: No hepatic masses identified. Tiny sub-cm  cyst seen adjacent to the gallbladder fossa. No evidence of steatosis on chemical shift imaging. Prior cholecystectomy. No evidence of biliary ductal dilatation, with common bile duct measuring proximally 3 mm. No evidence of choledocholithiasis. Pancreas: No evidence of pancreatic ductal dilatation or pancreas divisum. Spleen:  Within normal limits in size and appearance. Adrenals/Urinary Tract: No suspicious masses identified. No evidence of hydronephrosis. Stomach/Bowel: Unremarkable. Vascular/Lymphatic: No pathologically enlarged lymph nodes identified. No acute vascular findings. Other:  None. Musculoskeletal:  No suspicious bone lesions identified. IMPRESSION: Prior cholecystectomy. No evidence of biliary ductal dilatation, choledocholithiasis, or other hepatobiliary abnormality. Electronically Signed   By: Danae Orleans M.D.   On: 09/18/2022 17:47   MR 3D Recon At Scanner  Result Date: 09/18/2022 CLINICAL DATA:  Right upper quadrant pain. Elevated liver function tests. Prior cholecystectomy. EXAM: MRI ABDOMEN WITHOUT AND WITH CONTRAST (INCLUDING MRCP) TECHNIQUE: Multiplanar multisequence MR imaging of the abdomen was performed both before and after the administration of intravenous contrast. Heavily T2-weighted images of the biliary and pancreatic ducts were obtained, and three-dimensional MRCP images were rendered by post processing. CONTRAST:  10mL GADAVIST GADOBUTROL 1 MMOL/ML IV SOLN COMPARISON:  CT on 08/17/2022 FINDINGS: Lower chest: No acute findings. Hepatobiliary: No hepatic masses identified. Tiny sub-cm cyst seen adjacent to the gallbladder fossa. No evidence of steatosis on chemical shift imaging. Prior cholecystectomy. No evidence of biliary ductal dilatation, with common bile duct measuring proximally 3 mm. No evidence of choledocholithiasis. Pancreas: No evidence of pancreatic ductal dilatation or pancreas divisum. Spleen:  Within normal limits in size and appearance. Adrenals/Urinary Tract:  No suspicious masses identified. No evidence of hydronephrosis. Stomach/Bowel: Unremarkable. Vascular/Lymphatic: No pathologically enlarged lymph nodes identified. No acute vascular findings. Other:  None. Musculoskeletal:  No suspicious bone lesions identified. IMPRESSION: Prior cholecystectomy. No evidence of biliary ductal dilatation, choledocholithiasis, or other hepatobiliary abnormality. Electronically Signed   By: Danae Orleans M.D.   On: 09/18/2022 17:47    Assessment & Plan:  .Morbid obesity (HCC) Assessment & Plan: Improving BMI with appetite suppression.  Encouraged to start exercising regularly. Rx for  Wegovy 1.7 mg dose sent to her pharmacy  Lab Results  Component Value Date   LIPASE 36.0 08/31/2022   Lab Results  Component Value Date  ALT 33 08/31/2022   AST 27 08/31/2022   ALKPHOS 80 08/31/2022   BILITOT 0.8 08/31/2022      Transaminitis Assessment & Plan: Resolved.    serologies negative for viral and autoimmune etiologies.Marland Kitchen MRCP done to rule out retained stone from prior cholecystectomy    Chronic bronchitis, unspecified chronic bronchitis type [facility]) Assessment & Plan: Managed by Pulmonology , now using Trelegy    Physical deconditioning Assessment & Plan: Counselled that her exertional dyspnea is, due in part to deconditioning , encouraged to start exercising    Acute gastritis without hemorrhage, unspecified gastritis type Assessment & Plan: Resolved,  no longer using sucralfate   Other orders -     Semaglutide-Weight Management; Inject 0.5 mg into the skin once a week for 28 days.  Dispense: 2 mL; Refill: 2     I provided 34 minutes of face-to-face time during this encounter reviewing patient's last visit with me,   recent surgical and non surgical procedures, previous  labs and imaging studies, counseling on currently addressed issues,  and post visit ordering to diagnostics and therapeutics .   Follow-up: Return in about 6 months (around  03/29/2023).   Sherlene Shams, MD

## 2022-09-26 NOTE — Assessment & Plan Note (Signed)
Improving BMI with appetite suppression.  Encouraged to start exercising regularly. Rx for  Wegovy 1.7 mg dose sent to her pharmacy  Lab Results  Component Value Date   LIPASE 36.0 08/31/2022   Lab Results  Component Value Date   ALT 33 08/31/2022   AST 27 08/31/2022   ALKPHOS 80 08/31/2022   BILITOT 0.8 08/31/2022

## 2022-09-26 NOTE — Assessment & Plan Note (Signed)
Managed by Pulmonology , now using Trelegy

## 2022-09-26 NOTE — Assessment & Plan Note (Signed)
Counselled that her exertional dyspnea is, due in part to deconditioning , encouraged to start exercising

## 2022-09-26 NOTE — Assessment & Plan Note (Signed)
Resolved,  no longer using sucralfate

## 2022-09-26 NOTE — Assessment & Plan Note (Signed)
Resolved.    serologies negative for viral and autoimmune etiologies.Marland Kitchen MRCP done to rule out retained stone from prior cholecystectomy

## 2022-09-26 NOTE — Patient Instructions (Addendum)
Restart Wegovy at the 0.5 mg dose .  If you have not lost  weight after 2 weeks on the 0.5 mg dose,    you can increase to 1.0 mg (take 2 doses of 0.5 mg)  as long as you did not have any significant nausea during the first 2 week s    Here are the doses:  0.5 mg 1.0 1.7 2.4 mg

## 2022-10-11 NOTE — Progress Notes (Signed)
[name] [address] [place] [address] Phone: [phone] Subjective:    I'm seeing this patient by the request  of:  Sherlene Shams, MD  CC: Left shoulder pain follow-up  [id number]  Lisa [name] is a 47 y.o. female coming in with complaint of back and neck pain. OMT 09/19/2022. Also f/u for L shoulder pain. Patient states that her L shoulder has been bothering her since her last visit  Medications patient has been prescribed: None  Taking:         Reviewed prior external information including notes and imaging from previsou exam, outside providers and external EMR if available.   As well as notes that were available from care everywhere and other healthcare systems.  Past medical history, social, surgical and family history all reviewed in electronic medical record.  No pertanent information unless stated regarding to the chief complaint.   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 POSITIVE muscle aches  Objective  Blood pressure 122/68, pulse 73, height 5\' 4"  (1.626 m), weight 228 lb (103.4 kg), last menstrual period 09/05/2022, SpO2 98 %.   General: No apparent distress alert and oriented x3 mood and affect normal, dressed appropriately.  HEENT: Pupils equal, extraocular movements intact  Respiratory: Patient's speak in full sentences and does not appear short of breath  Cardiovascular: No lower extremity edema, non tender, no erythema  Left shoulder exam shows multiple trigger points noted in the trapezius, rhomboid and levator scapular muscles.  Patient does have Pain with certain range of motion of the shoulder but no true weakness of the rotator cuff noted.  After verbal consent patient was prepped with alcohol swab and with a 25-gauge half inch needle injected with 0.5 cc of 0.5% Marcaine and 0.5 cc of Kenalog 40 mg/mL 4 distinct trigger points.  These were in the rhomboid, trapezius and latissimus dorsi muscle.  No blood loss, Band-Aid placed.  Postinjection instructions given     Assessment and Plan:  Trigger point of left shoulder region Repeat injections given again today.  Was 1 year since she is having injections like this.  We discussed posture and ergonomics, home exercises, potentially gabapentin.  Increase activity slowly over the course of next several weeks.  Follow-up with  me again in 6 to 8 weeks.        The above documentation has been reviewed and is accurate and complete [name], DO         Note: This dictation was prepared with Dragon dictation along with smaller phrase technology. Any transcriptional errors that result from this process are unintentional.

## 2022-10-13 ENCOUNTER — Ambulatory Visit: Payer: 59 | Admitting: Family Medicine

## 2022-10-13 ENCOUNTER — Encounter: Payer: Self-pay | Admitting: Family Medicine

## 2022-10-13 VITALS — BP 122/68 | HR 73 | Ht 64.0 in | Wt 228.0 lb

## 2022-10-13 DIAGNOSIS — M25512 Pain in left shoulder: Secondary | ICD-10-CM

## 2022-10-13 NOTE — Patient Instructions (Signed)
gabapentin 200 mg at night  Send me a message in 2 weeks Keep doing exercises with PT  5-6 week follow up

## 2022-10-13 NOTE — Assessment & Plan Note (Signed)
Repeat injections given again today.  Was 1 year since she is having injections like this.  We discussed posture and ergonomics, home exercises, potentially gabapentin.  Increase activity slowly over the course of next several weeks.  Follow-up with me again in 6 to 8 weeks.

## 2022-10-17 ENCOUNTER — Other Ambulatory Visit: Payer: Self-pay

## 2022-10-18 ENCOUNTER — Encounter: Payer: Self-pay | Admitting: Internal Medicine

## 2022-10-24 ENCOUNTER — Encounter: Payer: Self-pay | Admitting: Family Medicine

## 2022-10-25 ENCOUNTER — Other Ambulatory Visit: Payer: Self-pay

## 2022-10-25 MED ORDER — SEMAGLUTIDE (1 MG/DOSE) 4 MG/3ML ~~LOC~~ SOPN
1.0000 mg | PEN_INJECTOR | SUBCUTANEOUS | 2 refills | Status: DC
  Filled 2022-10-25 – 2022-10-27 (×2): qty 3, 28d supply, fill #0
  Filled 2022-11-20: qty 3, 28d supply, fill #1
  Filled 2022-12-23: qty 3, 28d supply, fill #2

## 2022-10-25 NOTE — Telephone Encounter (Signed)
If okay I will send in the rx for the 1 mg dose.

## 2022-10-25 NOTE — Telephone Encounter (Signed)
It needs to be Ozempic..  I have sent it

## 2022-10-26 ENCOUNTER — Other Ambulatory Visit: Payer: Self-pay

## 2022-10-26 ENCOUNTER — Telehealth: Payer: Self-pay

## 2022-10-26 NOTE — Telephone Encounter (Signed)
Pharmacy Patient Advocate Encounter   Received notification that prior authorization for Ozempic (1 MG/DOSE) 4MG /3ML pen-injectors is required/requested.   PA submitted to [facility] via CoverMyMeds Key or (Medicaid) confirmation # [id number] Status is pending

## 2022-10-26 NOTE — Telephone Encounter (Signed)
PA for Ozempic is needed. 

## 2022-10-27 ENCOUNTER — Ambulatory Visit: Payer: 59 | Attending: Pulmonary Disease

## 2022-10-27 ENCOUNTER — Other Ambulatory Visit: Payer: Self-pay

## 2022-10-27 DIAGNOSIS — J449 Chronic obstructive pulmonary disease, unspecified: Secondary | ICD-10-CM | POA: Insufficient documentation

## 2022-10-27 DIAGNOSIS — R0602 Shortness of breath: Secondary | ICD-10-CM | POA: Diagnosis not present

## 2022-10-27 LAB — PULMONARY FUNCTION TEST ARMC ONLY
DL/VA % pred: 91 %
DL/VA: 4.04 ml/min/mmHg/L
DLCO unc % pred: 110 %
DLCO unc: 22.97 ml/min/mmHg
FEF 25-75 Post: 3.83 L/sec
FEF 25-75 Pre: 3.75 L/sec
FEF2575-%Change-Post: 2 %
FEF2575-%Pred-Post: 132 %
FEF2575-%Pred-Pre: 129 %
FEV1-%Change-Post: 1 %
FEV1-%Pred-Post: 129 %
FEV1-%Pred-Pre: 127 %
FEV1-Post: 3.65 L
FEV1-Pre: 3.59 L
FEV1FVC-%Change-Post: 1 %
FEV1FVC-%Pred-Pre: 102 %
FEV6-%Change-Post: 0 %
FEV6-%Pred-Post: 124 %
FEV6-%Pred-Pre: 125 %
FEV6-Post: 4.29 L
FEV6-Pre: 4.3 L
FEV6FVC-%Pred-Post: 102 %
FEV6FVC-%Pred-Pre: 102 %
FVC-%Change-Post: 0 %
FVC-%Pred-Post: 123 %
FVC-%Pred-Pre: 122 %
FVC-Post: 4.31 L
FVC-Pre: 4.3 L
Post FEV1/FVC ratio: 85 %
Post FEV6/FVC ratio: 100 %
Pre FEV1/FVC ratio: 83 %
Pre FEV6/FVC Ratio: 100 %
RV % pred: 93 %
RV: 1.53 L
TLC % pred: 118 %
TLC: 5.81 L

## 2022-10-27 MED ORDER — ALBUTEROL SULFATE (2.5 MG/3ML) 0.083% IN NEBU
2.5000 mg | INHALATION_SOLUTION | Freq: Once | RESPIRATORY_TRACT | Status: AC
  Administered 2022-10-27: 2.5 mg via RESPIRATORY_TRACT
  Filled 2022-10-27: qty 3

## 2022-10-28 ENCOUNTER — Other Ambulatory Visit: Payer: Self-pay

## 2022-10-28 NOTE — Telephone Encounter (Signed)
noted 

## 2022-10-30 ENCOUNTER — Encounter: Payer: Self-pay | Admitting: Pulmonary Disease

## 2022-10-31 ENCOUNTER — Ambulatory Visit: Payer: 59 | Admitting: Family Medicine

## 2022-10-31 NOTE — Telephone Encounter (Signed)
Patient Advocate Encounter  Prior Authorization for Ozempic (1 MG/DOSE) 4MG /3ML pen-injectors has been approved with [employer].    PA# [id number] Effective dates: 10/28/22 through 10/27/23

## 2022-10-31 NOTE — Telephone Encounter (Signed)
Pt is aware.  

## 2022-11-01 ENCOUNTER — Encounter: Payer: Self-pay | Admitting: Pulmonary Disease

## 2022-11-01 ENCOUNTER — Ambulatory Visit (INDEPENDENT_AMBULATORY_CARE_PROVIDER_SITE_OTHER): Payer: 59 | Admitting: Pulmonary Disease

## 2022-11-01 VITALS — BP 124/82 | HR 94 | Temp 98.2°F | Ht 64.0 in | Wt 215.2 lb

## 2022-11-01 DIAGNOSIS — J386 Stenosis of larynx: Secondary | ICD-10-CM

## 2022-11-01 DIAGNOSIS — R0602 Shortness of breath: Secondary | ICD-10-CM

## 2022-11-01 DIAGNOSIS — J4520 Mild intermittent asthma, uncomplicated: Secondary | ICD-10-CM

## 2022-11-01 DIAGNOSIS — J4530 Mild persistent asthma, uncomplicated: Secondary | ICD-10-CM

## 2022-11-01 NOTE — Progress Notes (Signed)
Subjective:    Patient ID: Lisa [name], female    DOB: 02-23-1976, 47 y.o.   MRN: [id number]  Patient Care Team: Lisa Shams, MD as PCP - General (Internal Medicine) Lisa Saner, MD as Consulting Physician (Pulmonary Disease)  Chief Complaint  Patient presents with   Follow-up    No SOB, wheezing or cough.    HPI Lisa [name] is a 47 year old lifelong never smoker with a prior history of subglottic stenosis status tracheal dilation in 2014 and redo dilation in 2021 by Dr. Christia [name], resents for evaluation of recurrent dyspnea on exertion.  I initially saw this patient in June 2023 for a similar issue post an episode of bronchitis.  I last saw her 14 Sep 2022 and at that time she noticed dyspnea on exertion fatigue and wheezing after she had had a sore throat and cough getting on 13 April.  She had just come back from a cruise at that point.  She was treated with prednisone on 17 April and subsequently developed discoloration of the sputum and was treated with azithromycin on 19 April.  This was followed by a round of Augmentin and more prednisone which she completed on the 29th.  She has not had any fevers, chills or sweats.  Viral panel obtained on 1 May was negative.  At that visit she was given while of Trelegy Ellipta 100, 1 inhalation daily.  She feels that this medication helps her.  As noted she has a history of subglottic stenosis and has required tracheal dilation previously.  After her episode in  June 23, she did see Dr. Jenne [name] again and he thought that reflux was causing some of her symptoms.  He did not performed laryngoscopy but had low index of suspicion for recurrent subglottic stenosis..   With regards to her cough this is improving after antibiotics and with the Trelegy.  No hemoptysis.  Her main complaint is that of shortness of breath with exertion and fatigue.  Wheezing has subsided with Trelegy.  She has been able to wear her surgical masks again at work  healthy.  Her gastroesophageal reflux symptoms are controlled.  She does not endorse any other symptomatology.  No fevers chills or sweats.  She had pulmonary function testing performed on 27 October 2022 that shows minimal obstructive airways disease and reactive airways.  The flow-volume loop was suggestive of fixed airway obstruction as seen in subglottic stenosis.   Review of Systems A 10 point review of systems was performed and it is as noted above otherwise negative.   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   Chronic bronchitis (HCC) 04/04/2022   GERD (gastroesophageal reflux disease) 04/04/2022   Complication of anesthesia 03/08/2022   Family history of colon cancer requiring screening colonoscopy 02/24/2022   Trigger point of left shoulder region 10/06/2021   Cervical radiculitis 03/03/2020   Weakness of left arm 03/03/2020   Cervicalgia 02/28/2020   Spondylosis, cervical, 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   Loud snoring 08/01/2019   B12 deficiency 08/01/2019   Eczema of left external ear 08/01/2019   Hyperlipidemia associated with type 2 diabetes mellitus (HCC) 07/31/2019   Nonallopathic lesion of cervical region 04/29/2019   Nonallopathic lesion of thoracic region 04/29/2019   Nonallopathic lesion of rib cage 04/29/2019   Cervical radiculopathy  at C7 03/26/2019   Allergic rhinitis due to pollen 08/18/2018   Type 2 diabetes mellitus without complications (HCC) 05/02/2017   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   Encounter for screening colonoscopy 04/29/2016   Low back pain without sciatica 04/28/2015   Morbid obesity (HCC) 04/28/2015    Social History   Tobacco Use   Smoking status: Never   Smokeless tobacco: Never  Substance Use Topics   Alcohol use: Yes     Alcohol/week: 2.0 standard drinks of alcohol    Types: 1 Glasses of wine, 1 Cans of beer per week    Comment: "social"    Allergies  Allergen Reactions   Chlorhexidine Gluconate     Developed pruritic rash on abdomen from prep for lap chole Mar 09 2022   Latex Hives    Rash and hives    Current Meds  Medication Sig   albuterol (VENTOLIN HFA) 108 (90 Base) MCG/ACT inhaler Inhale 2 puffs into the lungs every 6 (six) hours as needed for wheezing or shortness of breath.   cetirizine (ZYRTEC) 10 MG tablet Take 10 mg by mouth daily.   cholecalciferol (VITAMIN D3) 25 MCG (1000 UNIT) tablet Take 1,000 Units by mouth daily.   etonogestrel (NEXPLANON) 68 MG IMPL implant 1 each by Subdermal route once.   Fluticasone-Umeclidin-Vilant (TRELEGY ELLIPTA) 100-62.5-25 MCG/ACT AEPB Inhale 1 puff into the lungs daily.   folic acid (FOLVITE) 1 MG tablet Take 1 tablet (1 mg total) by mouth daily.   omeprazole (PRILOSEC) 40 MG capsule Take 1 capsule (40 mg total) by mouth daily.   Semaglutide, 1 MG/DOSE, 4 MG/3ML SOPN Inject 1 mg into the skin once a week.   Semaglutide-Weight Management 0.5 MG/0.5ML SOAJ Inject 0.5 mg into the skin once a week for 28 days.   Turmeric 400 MG CAPS Take 400 mg by mouth daily.     Immunization History  Administered Date(s) Administered   Influenza Split 02/25/2015, 01/30/2017   Influenza-Unspecified 02/09/2016, 01/21/2018, 02/12/2019, 03/13/2020, 02/10/2021, 02/10/2022   Moderna Sars-Covid-2 Vaccination 05/06/2019, 05/27/2019, 05/12/2020   PNEUMOCOCCAL CONJUGATE-20 01/07/2021   Tdap 04/27/2016        Objective:  BP 124/82 (BP Location: Left Arm, Cuff Size: Large)   Pulse 94   Temp 98.2 F (36.8 C)   Ht 5\' 4"  (1.626 m)   Wt 215 lb 3.2 oz (97.6 kg)   SpO2 100%   BMI 36.94 kg/m   SpO2: 100 % O2 Device: None (Room air)  GENERAL: Obese woman, no acute distress, fully ambulatory.  No conversational dyspnea. HEAD: Normocephalic, atraumatic.  EYES: Pupils equal,  round, reactive to light. No scleral icterus.  MOUTH: Nose/mouth/throat not examined due to institutional masking requirements. NECK: Supple. No thyromegaly. Trachea midline. No JVD. No adenopathy.  No stridor. PULMONARY: Good air entry bilaterally. No adventitious sounds. CARDIOVASCULAR: S1 and S2. Regular rate and rhythm.  ABDOMEN: Obese, otherwise benign. MUSCULOSKELETAL: No joint deformity, no clubbing, no edema.  NEUROLOGIC: No overt focal deficit, no gait disturbance, speech is fluent. SKIN: Intact,warm,dry. PSYCH: Mood and behavior normal.   Recent Results (from the past 2160 hour(s))  Comprehensive metabolic panel     Status: Abnormal   Collection Time: 08/17/22  8:45 AM  Result Value Ref Range   Sodium 133 (L) 135 - 145 mmol/L   Potassium 3.5 3.5 - 5.1 mmol/L   Chloride 105 98 - 111 mmol/L   CO2 20 (L) 22 - 32 mmol/L   Glucose, Bld 130 (H) 70 - 99 mg/dL  Comment: Glucose reference range applies only to samples taken after fasting for at least 8 hours.   BUN 17 6 - 20 mg/dL   Creatinine, Ser 2.95 0.44 - 1.00 mg/dL   Calcium 8.7 (L) 8.9 - 10.3 mg/dL   Total Protein 7.3 6.5 - 8.1 g/dL   Albumin 4.0 3.5 - 5.0 g/dL   AST 79 (H) 15 - 41 U/L   ALT 118 (H) 0 - 44 U/L   Alkaline Phosphatase 109 38 - 126 U/L   Total Bilirubin 1.0 0.3 - 1.2 mg/dL   GFR, Estimated >28 >41 mL/min    Comment: (NOTE) Calculated using the CKD-EPI Creatinine Equation (2021)    Anion gap 8 5 - 15    Comment: Performed at [facility], [address] [place], [place] [address]  CBC with Differential     Status: Abnormal   Collection Time: 08/17/22  8:45 AM  Result Value Ref Range   WBC 15.9 (H) 4.0 - 10.5 K/uL   RBC 5.40 (H) 3.87 - 5.11 MIL/uL   Hemoglobin 15.4 (H) 12.0 - 15.0 g/dL   HCT 10.2 72.5 - 36.6 %   MCV 83.0 80.0 - 100.0 fL   MCH 28.5 26.0 - 34.0 pg   MCHC 34.4 30.0 - 36.0 g/dL   RDW 44.0 34.7 - 42.5 %   Platelets 263 150 - 400 K/uL   nRBC 0.0 0.0 - 0.2 %   Neutrophils  Relative % 79 %   Neutro Abs 12.6 (H) 1.7 - 7.7 K/uL   Lymphocytes Relative 4 %   Lymphs Abs 0.6 (L) 0.7 - 4.0 K/uL   Monocytes Relative 4 %   Monocytes Absolute 0.6 0.1 - 1.0 K/uL   Eosinophils Relative 12 %   Eosinophils Absolute 1.9 (H) 0.0 - 0.5 K/uL   Basophils Relative 0 %   Basophils Absolute 0.0 0.0 - 0.1 K/uL   Immature Granulocytes 1 %   Abs Immature Granulocytes 0.14 (H) 0.00 - 0.07 K/uL    Comment: Performed at [facility], [address] [place], [place] [address]  Lipase, blood     Status: None   Collection Time: 08/17/22  8:45 AM  Result Value Ref Range   Lipase 35 11 - 51 U/L    Comment: Performed at [facility], [address], [place], [place] [address]  Urinalysis, Routine w reflex microscopic -Urine, Clean Catch     Status: Abnormal   Collection Time: 08/17/22  9:58 AM  Result Value Ref Range   Color, Urine YELLOW (A) YELLOW   APPearance CLEAR (A) CLEAR   Specific Gravity, Urine 1.029 1.005 - 1.030   pH 5.0 5.0 - 8.0   Glucose, UA NEGATIVE NEGATIVE mg/dL   Hgb urine dipstick NEGATIVE NEGATIVE   Bilirubin Urine NEGATIVE NEGATIVE   Ketones, ur 20 (A) NEGATIVE mg/dL   Protein, ur NEGATIVE NEGATIVE mg/dL   Nitrite NEGATIVE NEGATIVE   Leukocytes,Ua NEGATIVE NEGATIVE    Comment: Performed at [facility], [address] [place], [place] [address]  POC urine preg, ED     Status: None   Collection Time: 08/17/22 10:21 AM  Result Value Ref Range   Preg Test, Ur NEGATIVE NEGATIVE    Comment:        THE SENSITIVITY OF THIS METHODOLOGY IS >24 mIU/mL   Lipase     Status: None   Collection Time: 08/31/22 11:32 AM  Result Value Ref Range   Lipase 36.0 11.0 -  59.0 U/L  Comprehensive metabolic panel     Status: None   Collection Time: 08/31/22 11:32 AM  Result Value Ref Range   Sodium 141 135 - 145 mEq/L   Potassium 4.0 3.5 - 5.1 mEq/L   Chloride 108 96 - 112 mEq/L   CO2 23 19 - 32 mEq/L   Glucose, Bld 92 70 - 99 mg/dL    BUN 11 6 - 23 mg/dL   Creatinine, Ser 6.27 0.40 - 1.20 mg/dL   Total Bilirubin 0.8 0.2 - 1.2 mg/dL   Alkaline Phosphatase 80 39 - 117 U/L   AST 27 0 - 37 U/L   ALT 33 0 - 35 U/L   Total Protein 6.4 6.0 - 8.3 g/dL   Albumin 4.1 3.5 - 5.2 g/dL   GFR 035.00 >93.81 mL/min    Comment: Calculated using the CKD-EPI Creatinine Equation (2021)   Calcium 9.0 8.4 - 10.5 mg/dL  Hepatitis A Ab, Total     Status: None   Collection Time: 08/31/22 11:32 AM  Result Value Ref Range   Hepatitis A AB,Total NON-REACTIVE NON-REACTIVE    Comment: . For additional information, please refer to  http://education.quest[facility].com/faq/FAQ202  (This link is being provided for informational/ educational purposes only.) .   Hepatitis B surface antibody,quantitative     Status: None   Collection Time: 08/31/22 11:32 AM  Result Value Ref Range   Hep B S AB Quant (Post) 44 > OR = 10 mIU/mL    Comment: . Patient has immunity to hepatitis B virus. . For additional information, please refer to http://education.quest[facility].com/faq/FAQ105 (This link is being provided for informational/ educational purposes only).   Hepatitis B core antibody, total     Status: None   Collection Time: 08/31/22 11:32 AM  Result Value Ref Range   Hep B Core Total Ab NON-REACTIVE NON-REACTIVE    Comment: . For additional information, please refer to  http://education.[facility].com/faq/FAQ202  (This link is being provided for informational/ educational purposes only.) .   CBC with Differential/Platelet     Status: None   Collection Time: 08/31/22 11:32 AM  Result Value Ref Range   WBC 6.8 4.0 - 10.5 K/uL   RBC 4.92 3.87 - 5.11 Mil/uL   Hemoglobin 14.0 12.0 - 15.0 g/dL   HCT 82.9 93.7 - 16.9 %   MCV 85.2 78.0 - 100.0 fl   MCHC 33.4 30.0 - 36.0 g/dL   RDW 67.8 93.8 - 10.1 %   Platelets 211.0 150.0 - 400.0 K/uL   Neutrophils Relative % 73.5 43.0 - 77.0 %   Lymphocytes Relative 15.0 12.0 - 46.0 %   Monocytes  Relative 5.9 3.0 - 12.0 %   Eosinophils Relative 4.6 0.0 - 5.0 %   Basophils Relative 1.0 0.0 - 3.0 %   Neutro Abs 5.0 1.4 - 7.7 K/uL   Lymphs Abs 1.0 0.7 - 4.0 K/uL   Monocytes Absolute 0.4 0.1 - 1.0 K/uL   Eosinophils Absolute 0.3 0.0 - 0.7 K/uL   Basophils Absolute 0.1 0.0 - 0.1 K/uL  Hepatitis C antibody     Status: None   Collection Time: 08/31/22 11:32 AM  Result Value Ref Range   Hepatitis C Ab NON-REACTIVE NON-REACTIVE    Comment: . HCV antibody was non-reactive. There is no laboratory  evidence of HCV infection. . In most cases, no further action is required. However, if recent HCV exposure is suspected, a test for HCV RNA (test code 75102) is suggested. . For additional information please refer to  http://education.[facility].com/faq/FAQ22v1 (This link is being provided for informational/ educational purposes only.) .   ANA     Status: None   Collection Time: 08/31/22 11:33 AM  Result Value Ref Range   Anti Nuclear Antibody (ANA) NEGATIVE NEGATIVE    Comment: ANA IFA is a first line screen for detecting the presence of up to approximately 150 autoantibodies in various autoimmune diseases. A negative ANA IFA result suggests an ANA-associated autoimmune disease is not present at this time, but is not definitive. If there is high clinical suspicion for Sjogren's syndrome, testing for anti-SS-A/Ro antibody should be considered. Anti-Jo-1 antibody should be considered for clinically suspected inflammatory myopathies. . AC-0: Negative . International Consensus on ANA Patterns (SeverTies.uy) . For additional information, please refer to http://education.[facility].com/faq/FAQ177 (This link is being provided for informational/ educational purposes only.) .   Mitochondrial antibodies     Status: None   Collection Time: 08/31/22 11:33 AM  Result Value Ref Range   Mitochondrial M2 Ab, IgG <=20.0 <=20.0 U    Comment:  . Reference Range:    Negative:  <=20.0      U    Equivocal: 20.1 - 24.9 U    Positive:  >=25.0      U .   AntiMicrosomal Ab-Liver / Kidney     Status: None   Collection Time: 08/31/22 11:33 AM  Result Value Ref Range   LKM1 Ab <=20.0 <=20.0 U    Comment: . Reference Range:   <=20.0      Negative   20.1-24.9   Equivocal   >=25.0      Positive . Anti-liver/kidney microsomal antibodies (Anti-LKM-1) were previously tested by indirect immunofluorescence (IF) using rodent liver/kidney substrate. Identification of a specific antibody target as cytochrome P450 IID6 has led to the current recombinant based ELISA. Antibodies to this cytochrome are present in approximately 70% of patients with autoimmune hepatitis type 2. This antibody is also present in approximately 10% of patients with hepatitis C infection. Marland Kitchen   Resp panel by RT-PCR (RSV, Flu A&B, Covid) Anterior Nasal Swab     Status: None   Collection Time: 09/14/22 11:06 AM   Specimen: Anterior Nasal Swab  Result Value Ref Range   SARS Coronavirus 2 by RT PCR NEGATIVE NEGATIVE    Comment: (NOTE) SARS-CoV-2 target nucleic acids are NOT DETECTED.  The SARS-CoV-2 RNA is generally detectable in upper respiratory specimens during the acute phase of infection. The lowest concentration of SARS-CoV-2 viral copies this assay can detect is 138 copies/mL. A negative result does not preclude SARS-Cov-2 infection and should not be used as the sole basis for treatment or other patient management decisions. A negative result may occur with  improper specimen collection/handling, submission of specimen other than nasopharyngeal swab, presence of viral mutation(s) within the areas targeted by this assay, and inadequate number of viral copies(<138 copies/mL). A negative result must be combined with clinical observations, patient history, and epidemiological information. The expected result is Negative.  Fact Sheet for Patients:   BloggerCourse.com  Fact Sheet for Healthcare Providers:  SeriousBroker.it  This test is no t yet approved or cleared by the [place] FDA and  has been authorized for detection and/or diagnosis of SARS-CoV-2 by FDA under an Emergency Use Authorization (EUA). This EUA will remain  in effect (meaning this test can be used) for the duration of the COVID-19 declaration under Section 564(b)(1) of the Act, 21 U.S.C.section 360bbb-3(b)(1), unless the authorization is terminated  or revoked sooner.  Influenza A by PCR NEGATIVE NEGATIVE   Influenza B by PCR NEGATIVE NEGATIVE    Comment: (NOTE) The Xpert Xpress SARS-CoV-2/FLU/RSV plus assay is intended as an aid in the diagnosis of influenza from Nasopharyngeal swab specimens and should not be used as a sole basis for treatment. Nasal washings and aspirates are unacceptable for Xpert Xpress SARS-CoV-2/FLU/RSV testing.  Fact Sheet for Patients: BloggerCourse.com  Fact Sheet for Healthcare Providers: SeriousBroker.it  This test is not yet approved or cleared by the [place] FDA and has been authorized for detection and/or diagnosis of SARS-CoV-2 by FDA under an Emergency Use Authorization (EUA). This EUA will remain in effect (meaning this test can be used) for the duration of the COVID-19 declaration under Section 564(b)(1) of the Act, 21 U.S.C. section 360bbb-3(b)(1), unless the authorization is terminated or revoked.     Resp Syncytial Virus by PCR NEGATIVE NEGATIVE    Comment: (NOTE) Fact Sheet for Patients: BloggerCourse.com  Fact Sheet for Healthcare Providers: SeriousBroker.it  This test is not yet approved or cleared by the [place] FDA and has been authorized for detection and/or diagnosis of SARS-CoV-2 by FDA under an Emergency Use Authorization (EUA).  This EUA will remain in effect (meaning this test can be used) for the duration of the COVID-19 declaration under Section 564(b)(1) of the Act, 21 U.S.C. section 360bbb-3(b)(1), unless the authorization is terminated or revoked.  Performed at [facility], [address] [place], [place] [address]   Pulmonary Function Test [facility] Only     Status: None   Collection Time: 10/27/22  8:34 AM  Result Value Ref Range   FVC-Pre 4.30 L   FVC-%Pred-Pre 122 %   FVC-Post 4.31 L   FVC-%Pred-Post 123 %   FVC-%Change-Post 0 %   FEV1-Pre 3.59 L   FEV1-%Pred-Pre 127 %   FEV1-Post 3.65 L   FEV1-%Pred-Post 129 %   FEV1-%Change-Post 1 %   FEV6-Pre 4.30 L   FEV6-%Pred-Pre 125 %   FEV6-Post 4.29 L   FEV6-%Pred-Post 124 %   FEV6-%Change-Post 0 %   Pre FEV1/FVC ratio 83 %   FEV1FVC-%Pred-Pre 102 %   Post FEV1/FVC ratio 85 %   FEV1FVC-%Change-Post 1 %   Pre FEV6/FVC Ratio 100 %   FEV6FVC-%Pred-Pre 102 %   Post FEV6/FVC ratio 100 %   FEV6FVC-%Pred-Post 102 %   FEF 25-75 Pre 3.75 L/sec   FEF2575-%Pred-Pre 129 %   FEF 25-75 Post 3.83 L/sec   FEF2575-%Pred-Post 132 %   FEF2575-%Change-Post 2 %   RV 1.53 L   RV % pred 93 %   TLC 5.81 L   TLC % pred 118 %   DLCO unc 22.97 ml/min/mmHg   DLCO unc % pred 110 %   DL/VA 7.25 ml/min/mmHg/L   DL/VA % pred 91 %     Assessment & Plan:     ICD-10-CM   1. Subglottic stenosis  J38.6 CT SOFT TISSUE NECK WO CONTRAST    Pulmonary Function Test [facility] Only   Suspect worsening by flow-volume loop Soft tissue neck CT Chest CT May need to revisit with Dr. Jenne [name] (ENT)    2. Shortness of breath  R06.02 CT CHEST WO CONTRAST    Pulmonary Function Test [facility] Only   Chest CT query bronchial stenosis as well Deconditioning may also be adding to the issue    3. Mild persistent asthmatic bronchitis without complication  J45.30 Pulmonary Function Test [facility] Only   Symptoms currently well-controlled on Trelegy Continue Trelegy 100, inhalation  daily Albuterol as needed     Orders Placed This Encounter  Procedures   CT CHEST WO CONTRAST    Standing Status:   Future    Standing Expiration Date:   11/01/2023    Order Specific Question:   Preferred imaging location?    Answer:   [facility]    Order Specific Question:   Is patient pregnant?    Answer:   No   CT SOFT TISSUE NECK WO CONTRAST    Standing Status:   Future    Standing Expiration Date:   11/01/2023    Order Specific Question:   Is patient pregnant?    Answer:   No    Order Specific Question:   Preferred imaging location?    Answer:   [facility]    Return in 3 to 4 months, for Asthmatic bronchitis/subglottic stenosis.  Call sooner should any problems arise.    Gailen Shelter, MD Advanced Bronchoscopy PCCM [facility]    *This note was dictated using voice recognition software/Dragon.  Despite best efforts to proofread, errors can occur which can change the meaning. Any transcriptional errors that result from this process are unintentional and may not be fully corrected at the time of dictation.

## 2022-11-01 NOTE — Patient Instructions (Signed)
Regular get a CT of the neck and chest.  Trelegy 1 puff daily.  Make sure you rinse your mouth well after you use it.  We will see you in follow-up in 3 to 4 months time with an abbreviated version of the breathing tests at that time.

## 2022-11-04 ENCOUNTER — Encounter: Payer: Self-pay | Admitting: Pulmonary Disease

## 2022-11-07 ENCOUNTER — Ambulatory Visit: Payer: 59 | Attending: Internal Medicine

## 2022-11-07 DIAGNOSIS — G8929 Other chronic pain: Secondary | ICD-10-CM

## 2022-11-07 DIAGNOSIS — M25512 Pain in left shoulder: Secondary | ICD-10-CM | POA: Insufficient documentation

## 2022-11-07 DIAGNOSIS — M546 Pain in thoracic spine: Secondary | ICD-10-CM

## 2022-11-07 DIAGNOSIS — M542 Cervicalgia: Secondary | ICD-10-CM

## 2022-11-07 NOTE — Therapy (Signed)
PT/OT/SLP Screening Form   Time: in 13:00      Time out 13:30   Complaint L arm and shoulder pain.  Past Medical Hx: L upper trap/shoulder pain Injury Date:worsened over Saturday  Pain Scale: 6/10 Patient's phone number: n/a  Hx (this occurrence):   Patient had bronchitis and had excessive coughing. Had increased pain in L upper trap and shoulder radiating to fingers.    Assessment:  Significant upper trap tension of L upper trap, Limited scapular mobility : scapula elevated and rotated  Mobilizations: grade II: hypomobile thoracic spine and 6th rib  ROM: severely limited by pain and L upper trap tension:   Palpation: guarding of subscap, infraspinatus, and upper trap   Trigger Point Dry Needling (TDN), unbilled Education performed with patient regarding potential benefit of TDN. Reviewed precautions and risks with patient. Reviewed special precautions/risks over lung fields which include pneumothorax. Reviewed signs and symptoms of pneumothorax and advised pt to go to ER immediately if these symptoms develop advise them of dry needling treatment. Extensive time spent with pt to ensure full understanding of TDN risks. Pt provided verbal consent to treatment. TDN performed to  with 0.25 x 40 single needle placements with local twitch response (LTR). Pistoning technique utilized. Improved pain-free motion following intervention. Upper trap, subscap, levator scap    Recommendations:    Comments: Patient has significant upper trap tension and L shoulder complex deficit. TDN tolerated well with significant trigger points reduced by end of session. At this time majority of symptoms are resolved. Patient educated on median nerve glide for HEP.     []  Patient would benefit from an MD referral []  Patient would benefit from a full PT/OT/ SLP evaluation and treatment. [x]  No intervention recommended at this time  Precious Bard, PT, DPT Physical Therapist - [facility]  Outpatient Physical Therapy- Main Campus [phone]

## 2022-11-08 ENCOUNTER — Ambulatory Visit
Admission: RE | Admit: 2022-11-08 | Discharge: 2022-11-08 | Disposition: A | Discharge status: Home / Self Care | Payer: 59 | Source: Ambulatory Visit | Attending: Pulmonary Disease | Admitting: Pulmonary Disease

## 2022-11-08 ENCOUNTER — Ambulatory Visit: Payer: 59 | Admitting: Internal Medicine

## 2022-11-08 DIAGNOSIS — R0602 Shortness of breath: Secondary | ICD-10-CM | POA: Diagnosis not present

## 2022-11-08 DIAGNOSIS — I3139 Other pericardial effusion (noninflammatory): Secondary | ICD-10-CM | POA: Diagnosis not present

## 2022-11-08 DIAGNOSIS — J386 Stenosis of larynx: Secondary | ICD-10-CM | POA: Insufficient documentation

## 2022-11-08 DIAGNOSIS — M47812 Spondylosis without myelopathy or radiculopathy, cervical region: Secondary | ICD-10-CM | POA: Diagnosis not present

## 2022-11-14 ENCOUNTER — Telehealth: Payer: Self-pay | Admitting: Pulmonary Disease

## 2022-11-14 NOTE — Progress Notes (Unsigned)
[name] [address] [place] [address] Phone: [phone] Subjective:   Lisa [name], am serving as a scribe for Dr. Antoine Primas.  I'm seeing this patient by the request  of:  Sherlene Shams, MD  CC: shoulder pain follow up   [id number]  10/13/2022 Repeat injections given again today.  Was 1 year since she is having injections like this.  We discussed posture and ergonomics, home exercises, potentially gabapentin.  Increase activity slowly over the course of next several weeks.  Follow-up with me again in 6 to 8 weeks.     Updated 12/13/2022 Lisa [name] is a 47 y.o. female coming in with complaint of shoulder pain. Had trigger points last appointment. OMT on 09/19/2022. Doing well. Trigger point injection helped. No new concerns.  Patient continues to have intermittent pain going down the arm.  Feels like well maybe it is not as frequent and not as intense.       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   PONV (postoperative nausea and vomiting)    Pre-syncope 07/14/2014   Shortness of breath    "wheezing"   Weakness of left arm 03/03/2020   Past Surgical History:  Procedure Laterality Date   CESAREAN SECTION     x 2   CHOLECYSTECTOMY  03/09/2022   COLONOSCOPY WITH PROPOFOL N/A 07/11/2022   Procedure: COLONOSCOPY WITH PROPOFOL;  Surgeon: Wyline Mood, MD;  Location: [facility]  ENDOSCOPY;  Service: Gastroenterology;  Laterality: N/A;   MICROLARYNGOSCOPY WITH DILATION N/A 03/13/2020   Procedure: MICROLARYNGOSCOPY WITH DILATION w/JET VENT MITOMYCIN C;  Surgeon: Christia Reading, MD;  Location: [facility] OR;  Service: ENT;  Laterality: N/A;   MICROLARYNGOSCOPY WITH LASER AND BALLOON DILATION N/A 07/20/2012   Procedure: MICROLARYNGOSCOPY WITH LASER AND BALLOON DILATION;  Surgeon: Christia Reading, MD;  Location: [facility] OR;  Service: ENT;  Laterality: N/A;  WITH JET VENTURI VENTILATION   TONGUE FLAP RELEASE     widom extractions     Social History   Socioeconomic History   Marital status: Married    Spouse name: Not on file   Number of children: Not on file   Years of education: Not on file   Highest education level: Associate degree: academic program  Occupational History   Not on file  Tobacco Use   Smoking status: Never   Smokeless tobacco: Never  Vaping Use   Vaping Use: Never used  Substance and Sexual Activity   Alcohol use: Yes    Alcohol/week: 2.0 standard drinks of alcohol    Types: 1 Glasses of wine, 1 Cans of beer per week    Comment: "social"   Drug use: No   Sexual activity: Yes    Birth control/protection: Implant  Other Topics Concern   Not on file  Social History Narrative   Not on file   Social Determinants of Health  Financial Resource Strain: Low Risk  (08/31/2022)   Overall Financial Resource Strain (CARDIA)    Difficulty of Paying Living Expenses: Not hard at all  Food Insecurity: No Food Insecurity (08/31/2022)   Hunger Vital Sign    Worried About Running Out of Food in the Last Year: Never true    Ran Out of Food in the Last Year: Never true  Transportation Needs: No Transportation Needs (08/31/2022)   PRAPARE - Administrator, Civil Service (Medical): No    Lack of Transportation (Non-Medical): No  Physical Activity: Unknown (08/31/2022)   Exercise Vital Sign    Days of Exercise per Week: Patient declined    Minutes of Exercise  per Session: Not on file  Stress: No Stress Concern Present (08/31/2022)   [employer] of Occupational Health - Occupational Stress Questionnaire    Feeling of Stress : Not at all  Social Connections: Unknown (08/31/2022)   Social Connection and Isolation Panel [NHANES]    Frequency of Communication with Friends and Family: Three times a week    Frequency of Social Gatherings with Friends and Family: Once a week    Attends Religious Services: Patient declined    Active Member of Clubs or Organizations: Patient declined    Attends Banker Meetings: Not on file    Marital Status: Married   Allergies  Allergen Reactions   Chlorhexidine Gluconate     Developed pruritic rash on abdomen from prep for lap chole Mar 09 2022   Latex Hives    Rash and hives   Family History  Problem Relation Age of Onset   Hypertension Mother    Hyperlipidemia Mother    Arthritis Mother    Rheum arthritis Mother    Hypertension Father    Hyperlipidemia Father    Cancer Maternal Grandmother        COLON CANCER   Breast cancer Neg Hx     Current Outpatient Medications (Endocrine & Metabolic):    etonogestrel (NEXPLANON) 68 MG IMPL implant, 1 each by Subdermal route once.   Semaglutide, 1 MG/DOSE, 4 MG/3ML SOPN, Inject 1 mg into the skin once a week.   Current Outpatient Medications (Respiratory):    albuterol (VENTOLIN HFA) 108 (90 Base) MCG/ACT inhaler, Inhale 2 puffs into the lungs every 6 (six) hours as needed for wheezing or shortness of breath.   cetirizine (ZYRTEC) 10 MG tablet, Take 10 mg by mouth daily.   Fluticasone-Umeclidin-Vilant (TRELEGY ELLIPTA) 100-62.5-25 MCG/ACT AEPB, Inhale 1 puff into the lungs daily.   Current Outpatient Medications (Hematological):    folic acid (FOLVITE) 1 MG tablet, Take 1 tablet (1 mg total) by mouth daily.  Current Outpatient Medications (Other):    cholecalciferol (VITAMIN D3) 25 MCG (1000 UNIT) tablet, Take 1,000 Units by mouth daily.    omeprazole (PRILOSEC) 40 MG capsule, Take 1 capsule (40 mg total) by mouth daily.   Semaglutide-Weight Management 0.5 MG/0.5ML SOAJ, Inject 0.5 mg into the skin once a week for 28 days.   Turmeric 400 MG CAPS, Take 400 mg by mouth daily.    Reviewed prior external information including notes and imaging from  primary care provider As well as notes that were available from care everywhere and other healthcare systems.  Past medical history, social, surgical and family history all reviewed in electronic medical record.  No pertanent information unless stated regarding to the chief complaint.   Review of Systems:  No headache, visual changes, nausea, vomiting, diarrhea, constipation, dizziness, abdominal  pain, skin rash, fevers, chills, night sweats, weight loss, swollen lymph nodes, body aches, joint swelling, chest pain, shortness of breath, mood changes. POSITIVE muscle aches  Objective  Blood pressure 102/70, pulse (!) 103, height 5\' 4"  (1.626 m), weight 221 lb (100.2 kg), SpO2 98 %.   General: No apparent distress alert and oriented x3 mood and affect normal, dressed appropriately.  HEENT: Pupils equal, extraocular movements intact  Respiratory: Patient's speak in full sentences and does not appear short of breath  Cardiovascular: No lower extremity edema, non tender, no erythema  Patient back exam does have some loss of lordosis still noted.  Tightness noted with sidebending severe tightness noted down the left paraspinal musculature in the parascapular area. Patient does have some limited sidebending of the neck to the left.  Patient does have some limited extension of the neck of 5 to 10 degrees as well.  Osteopathic findings C6 flexed rotated and side bent left T3 extended rotated and side bent left inhaled third rib T9 extended rotated and side bent left L2 flexed rotated and side bent right Sacrum right on right    Impression and Recommendations:    The above documentation  has been reviewed and is accurate and complete [name], DO

## 2022-11-14 NOTE — Telephone Encounter (Signed)
Patient is returning phone call. Patient phone number is [phone].

## 2022-11-14 NOTE — Telephone Encounter (Signed)
Lisa Saner, MD  P [facility] Clinical Pool The CT of the neck and chest look very good.  There is an error on the report that I have already alerted radiology to there is no evidence of cancer.  They will correct this.  Is she having reflux symptoms?  Patient is aware of results and voiced her understanding. She is taking meds for reflux. Sx are controlled. Nothing further needed.

## 2022-11-16 ENCOUNTER — Ambulatory Visit (INDEPENDENT_AMBULATORY_CARE_PROVIDER_SITE_OTHER): Payer: 59 | Admitting: Family Medicine

## 2022-11-16 ENCOUNTER — Encounter: Payer: Self-pay | Admitting: Family Medicine

## 2022-11-16 VITALS — BP 102/70 | HR 103 | Ht 64.0 in | Wt 221.0 lb

## 2022-11-16 DIAGNOSIS — M9904 Segmental and somatic dysfunction of sacral region: Secondary | ICD-10-CM | POA: Diagnosis not present

## 2022-11-16 DIAGNOSIS — M9902 Segmental and somatic dysfunction of thoracic region: Secondary | ICD-10-CM | POA: Diagnosis not present

## 2022-11-16 DIAGNOSIS — M4712 Other spondylosis with myelopathy, cervical region: Secondary | ICD-10-CM | POA: Diagnosis not present

## 2022-11-16 DIAGNOSIS — M9901 Segmental and somatic dysfunction of cervical region: Secondary | ICD-10-CM

## 2022-11-16 DIAGNOSIS — M9908 Segmental and somatic dysfunction of rib cage: Secondary | ICD-10-CM

## 2022-11-16 DIAGNOSIS — M9903 Segmental and somatic dysfunction of lumbar region: Secondary | ICD-10-CM | POA: Diagnosis not present

## 2022-11-16 NOTE — Assessment & Plan Note (Signed)
Known degenerative disc disease.  CT scan recently looking for the subglottis stenosis that showed patient also has degenerative disc disease with a C5-C6 and C7 potential nerve impingement noted on the left side.  Discussed with patient about icing regimen and home exercises, discussed which activities to do and which ones to avoid.  Increase activity slowly.  Follow-up with me again in 6 to 8 weeks otherwise.

## 2022-11-16 NOTE — Patient Instructions (Signed)
Good to see you! Have a happy 4th Think about the Epidural See you again in 6 weeks

## 2022-11-24 DIAGNOSIS — M542 Cervicalgia: Secondary | ICD-10-CM | POA: Diagnosis not present

## 2022-11-25 ENCOUNTER — Other Ambulatory Visit: Payer: Self-pay | Admitting: Oncology

## 2022-11-25 DIAGNOSIS — Z006 Encounter for examination for normal comparison and control in clinical research program: Secondary | ICD-10-CM

## 2022-11-29 DIAGNOSIS — M542 Cervicalgia: Secondary | ICD-10-CM | POA: Diagnosis not present

## 2022-12-01 DIAGNOSIS — M542 Cervicalgia: Secondary | ICD-10-CM | POA: Diagnosis not present

## 2022-12-08 DIAGNOSIS — M542 Cervicalgia: Secondary | ICD-10-CM | POA: Diagnosis not present

## 2022-12-10 ENCOUNTER — Telehealth: Payer: 59 | Admitting: Physician Assistant

## 2022-12-10 DIAGNOSIS — U071 COVID-19: Secondary | ICD-10-CM

## 2022-12-10 MED ORDER — NIRMATRELVIR/RITONAVIR (PAXLOVID)TABLET
3.0000 | ORAL_TABLET | Freq: Two times a day (BID) | ORAL | 0 refills | Status: AC
Start: 2022-12-10 — End: 2022-12-15

## 2022-12-10 NOTE — Patient Instructions (Signed)
Charlann Lange, thank you for joining [name], PA-C for today's virtual visit.  While this provider is not your primary care provider (PCP), if your PCP is located in our provider database this encounter information will be shared with them immediately following your visit.   A [facility] MyChart account gives you access to today's visit and all your visits, tests, and labs performed at [facility] " click here if you don't have a [facility] MyChart account or go to [email or web address]  Consent: (Patient) Lisa [name] provided verbal consent for this virtual visit at the beginning of the encounter.  Current Medications:  Current Outpatient Medications:    nirmatrelvir/ritonavir (PAXLOVID) 20 x 150 MG & 10 x 100MG  TABS, Take 3 tablets by mouth 2 (two) times daily for 5 days. (Take nirmatrelvir 150 mg two tablets twice daily for 5 days and ritonavir 100 mg one tablet twice daily for 5 days) Patient GFR is 106, Disp: 30 tablet, Rfl: 0   albuterol (VENTOLIN HFA) 108 (90 Base) MCG/ACT inhaler, Inhale 2 puffs into the lungs every 6 (six) hours as needed for wheezing or shortness of breath., Disp: 6.7 g, Rfl: 0   cetirizine (ZYRTEC) 10 MG tablet, Take 10 mg by mouth daily., Disp: , Rfl:    cholecalciferol (VITAMIN D3) 25 MCG (1000 UNIT) tablet, Take 1,000 Units by mouth daily., Disp: , Rfl:    etonogestrel (NEXPLANON) 68 MG IMPL implant, 1 each by Subdermal route once., Disp: , Rfl:    Fluticasone-Umeclidin-Vilant (TRELEGY ELLIPTA) 100-62.5-25 MCG/ACT AEPB, Inhale 1 puff into the lungs daily., Disp: 60 each, Rfl: 11   folic acid (FOLVITE) 1 MG tablet, Take 1 tablet (1 mg total) by mouth daily., Disp: 90 tablet, Rfl: 1   omeprazole (PRILOSEC) 40 MG capsule, Take 1 capsule (40 mg total) by mouth daily., Disp: 90 capsule, Rfl: 1   Semaglutide, 1 MG/DOSE, 4 MG/3ML SOPN, Inject 1 mg into the skin once a week., Disp: 3 mL, Rfl: 2   Turmeric 400 MG CAPS, Take 400 mg  by mouth daily. , Disp: , Rfl:    Medications ordered in this encounter:  Meds ordered this encounter  Medications   nirmatrelvir/ritonavir (PAXLOVID) 20 x 150 MG & 10 x 100MG  TABS    Sig: Take 3 tablets by mouth 2 (two) times daily for 5 days. (Take nirmatrelvir 150 mg two tablets twice daily for 5 days and ritonavir 100 mg one tablet twice daily for 5 days) Patient GFR is 106    Dispense:  30 tablet    Refill:  0    Order Specific Question:   Supervising Provider    Answer:   Merrilee Jansky [id number]     *If you need refills on other medications prior to your next appointment, please contact your pharmacy*  Follow-Up: Call back or seek an in-person evaluation if the symptoms worsen or if the condition fails to improve as anticipated.  [facility] [phone]  Care Instructions: Paxlovid (Nirmatrelvir; Ritonavir) Tablets What is this medication? NIRMATRELVIR; RITONAVIR (NIR ma TREL vir; ri TOE na veer) treats mild to moderate COVID-19. It may help people who are at high risk of developing severe illness. It works by limiting the spread of the virus in your body. This medicine may be used for other purposes; ask your health care provider or pharmacist if you have questions. COMMON BRAND NAME(S): PAXLOVID What should I tell my care team before I take this medication? They need  to know if you have any of these conditions: Any allergies Any serious illness Kidney disease Liver disease An unusual or allergic reaction to nirmatrelvir, ritonavir, other medications, foods, dyes, or preservatives Pregnant or trying to get pregnant Breast-feeding How should I use this medication? This product contains 2 different medications that are packaged together. For the standard dose, take 2 pink tablets of nirmatrelvir with 1 white tablet of ritonavir (3 tablets total) by mouth with water twice daily. Talk to your care team if you have kidney disease. You may need a different  dose. Swallow the tablets whole. You can take it with or without food. If it upsets your stomach, take it with food. Take all of this medication unless your care team tells you to stop it early. Keep taking it even if you think you are better. Talk to your care team about the use of this medication in children. While it may be prescribed for children as young as 12 years for selected conditions, precautions do apply. Overdosage: If you think you have taken too much of this medicine contact a poison control center or emergency room at once. NOTE: This medicine is only for you. Do not share this medicine with others. What if I miss a dose? If you miss a dose, take it as soon as you can unless it is more than 8 hours late. If it is more than 8 hours late, skip the missed dose. Take the next dose at the normal time. Do not take extra or 2 doses at the same time to make up for the missed dose. What may interact with this medication? Do not take this medication with any of the following: Alfuzosin Certain medications for anxiety or sleep, such as midazolam or triazolam Certain medications for cancer, such as apalutamide Certain medications for cholesterol, such as lovastatin or simvastatin Certain medications for irregular heartbeat, such as amiodarone, dronedarone, flecainide, propafenone, quinidine Certain medications for mental health conditions, such as lurasidone or pimozide Certain medications for seizures, such as carbamazepine, phenobarbital, phenytoin, primidone Colchicine Eletriptan Eplerenone Ergot alkaloids, such as dihydroergotamine, ergotamine, methylergonovine Finerenone Flibanserin Ivabradine Lomitapide Lumacaftor; ivacaftor Naloxegol Ranolazine Red Yeast Rice Rifampin Rifapentine Sildenafil Silodosin [facility]. John's wort Tolvaptan Ubrogepant Voclosporin This medication may affect how other medications work, and other medications may affect the way this medication works. Talk  with your care team about all of the medications you take. They may suggest changes to your treatment plan to lower the risk of side effects and to make sure your medications work as intended. This list may not describe all possible interactions. Give your health care provider a list of all the medicines, herbs, non-prescription drugs, or dietary supplements you use. Also tell them if you smoke, drink alcohol, or use illegal drugs. Some items may interact with your medicine. What should I watch for while using this medication? Your condition will be monitored carefully while you are receiving this medication. Visit your care team for regular checkups. Tell your care team if your symptoms do not start to get better or if they get worse. If you have untreated HIV infection, this medication may lead to some HIV medications not working as well in the future. Estrogen and progestin hormones may not work as well while you are taking this medication. Your care team can help you find the contraceptive option that works for you. What side effects may I notice from receiving this medication? Side effects that you should report to your care team as  soon as possible: Allergic reactions--skin rash, itching, hives, swelling of the face, lips, tongue, or throat Liver injury--right upper belly pain, loss of appetite, nausea, light-colored stool, dark yellow or brown urine, yellowing skin or eyes, unusual weakness or fatigue Redness, blistering, peeling, or loosening of the skin, including inside the mouth Side effects that usually do not require medical attention (report these to your care team if they continue or are bothersome): Change in taste Diarrhea General discomfort and fatigue Increase in blood pressure Muscle pain Nausea Stomach pain This list may not describe all possible side effects. Call your doctor for medical advice about side effects. You may report side effects to FDA at 1-800-FDA-1088. Where  should I keep my medication? Keep out of the reach of children and pets. Store at room temperature between 20 and 25 degrees C (68 and 77 degrees F). Get rid of any unused medication after the expiration date. To get rid of medications that are no longer needed or have expired: Take the medication to a medication take-back program. Check with your pharmacy or law enforcement to find a location. If you cannot return the medication, check the label or package insert to see if the medication should be thrown out in the garbage or flushed down the toilet. If you are not sure, ask your care team. If it is safe to put it in the trash, take the medication out of the container. Mix the medication with cat litter, dirt, coffee grounds, or other unwanted substance. Seal the mixture in a bag or container. Put it in the trash. NOTE: This sheet is a summary. It may not cover all possible information. If you have questions about this medicine, talk to your doctor, pharmacist, or health care provider.  2024 Elsevier/Gold Standard (2022-06-20 00:00:00)    Isolation Instructions: You are to isolate at home until you have been fever free for at least 24 hours without a fever-reducing medication, and symptoms have been steadily improving for 24 hours. At that time,  you can end isolation but need to mask for an additional 5 days.   If you must be around other household members who do not have symptoms, you need to make sure that both you and the family members are masking consistently with a high-quality mask.  If you note any worsening of symptoms despite treatment, please seek an in-person evaluation ASAP. If you note any significant shortness of breath or any chest pain, please seek ER evaluation. Please do not delay care!   COVID-19: What to Do if You Are Sick If you test positive and are an older adult or someone who is at high risk of getting very sick from COVID-19, treatment may be available. Contact a  healthcare provider right away after a positive test to determine if you are eligible, even if your symptoms are mild right now. You can also visit a Test to Treat location and, if eligible, receive a prescription from a provider. Don't delay: Treatment must be started within the first few days to be effective. If you have a fever, cough, or other symptoms, you might have COVID-19. Most people have mild illness and are able to recover at home. If you are sick: Keep track of your symptoms. If you have an emergency warning sign (including trouble breathing), call 911. Steps to help prevent the spread of COVID-19 if you are sick If you are sick with COVID-19 or think you might have COVID-19, follow the steps below to care for yourself  and to help protect other people in your home and community. Stay home except to get medical care Stay home. Most people with COVID-19 have mild illness and can recover at home without medical care. Do not leave your home, except to get medical care. Do not visit public areas and do not go to places where you are unable to wear a mask. Take care of yourself. Get rest and stay hydrated. Take over-the-counter medicines, such as acetaminophen, to help you feel better. Stay in touch with your doctor. Call before you get medical care. Be sure to get care if you have trouble breathing, or have any other emergency warning signs, or if you think it is an emergency. Avoid public transportation, ride-sharing, or taxis if possible. Get tested If you have symptoms of COVID-19, get tested. While waiting for test results, stay away from others, including staying apart from those living in your household. Get tested as soon as possible after your symptoms start. Treatments may be available for people with COVID-19 who are at risk for becoming very sick. Don't delay: Treatment must be started early to be effective--some treatments must begin within 5 days of your first symptoms. Contact your  healthcare provider right away if your test result is positive to determine if you are eligible. Self-tests are one of several options for testing for the virus that causes COVID-19 and may be more convenient than laboratory-based tests and point-of-care tests. Ask your healthcare provider or your local health department if you need help interpreting your test results. You can visit your state, tribal, local, and territorial health department's website to look for the latest local information on testing sites. Separate yourself from other people As much as possible, stay in a specific room and away from other people and pets in your home. If possible, you should use a separate bathroom. If you need to be around other people or animals in or outside of the home, wear a well-fitting mask. Tell your close contacts that they may have been exposed to COVID-19. An infected person can spread COVID-19 starting 48 hours (or 2 days) before the person has any symptoms or tests positive. By letting your close contacts know they may have been exposed to COVID-19, you are helping to protect everyone. See COVID-19 and Animals if you have questions about pets. If you are diagnosed with COVID-19, someone from the health department may call you. Answer the call to slow the spread. Monitor your symptoms Symptoms of COVID-19 include fever, cough, or other symptoms. Follow care instructions from your healthcare provider and local health department. Your local health authorities may give instructions on checking your symptoms and reporting information. When to seek emergency medical attention Look for emergency warning signs* for COVID-19. If someone is showing any of these signs, seek emergency medical care immediately: Trouble breathing Persistent pain or pressure in the chest New confusion Inability to wake or stay awake Pale, gray, or blue-colored skin, lips, or nail beds, depending on skin tone *This list is not  all possible symptoms. Please call your medical provider for any other symptoms that are severe or concerning to you. Call 911 or call ahead to your local emergency facility: Notify the operator that you are seeking care for someone who has or may have COVID-19. Call ahead before visiting your doctor Call ahead. Many medical visits for routine care are being postponed or done by phone or telemedicine. If you have a medical appointment that cannot be postponed, call your doctor's  office, and tell them you have or may have COVID-19. This will help the office protect themselves and other patients. If you are sick, wear a well-fitting mask You should wear a mask if you must be around other people or animals, including pets (even at home). Wear a mask with the best fit, protection, and comfort for you. You don't need to wear the mask if you are alone. If you can't put on a mask (because of trouble breathing, for example), cover your coughs and sneezes in some other way. Try to stay at least 6 feet away from other people. This will help protect the people around you. Masks should not be placed on young children under age 45 years, anyone who has trouble breathing, or anyone who is not able to remove the mask without help. Cover your coughs and sneezes Cover your mouth and nose with a tissue when you cough or sneeze. Throw away used tissues in a lined trash can. Immediately wash your hands with soap and water for at least 20 seconds. If soap and water are not available, clean your hands with an alcohol-based hand sanitizer that contains at least 60% alcohol. Clean your hands often Wash your hands often with soap and water for at least 20 seconds. This is especially important after blowing your nose, coughing, or sneezing; going to the bathroom; and before eating or preparing food. Use hand sanitizer if soap and water are not available. Use an alcohol-based hand sanitizer with at least 60% alcohol, covering  all surfaces of your hands and rubbing them together until they feel dry. Soap and water are the best option, especially if hands are visibly dirty. Avoid touching your eyes, nose, and mouth with unwashed hands. Handwashing Tips Avoid sharing personal household items Do not share dishes, drinking glasses, cups, eating utensils, towels, or bedding with other people in your home. Wash these items thoroughly after using them with soap and water or put in the dishwasher. Clean surfaces in your home regularly Clean and disinfect high-touch surfaces (for example, doorknobs, tables, handles, light switches, and countertops) in your "sick room" and bathroom. In shared spaces, you should clean and disinfect surfaces and items after each use by the person who is ill. If you are sick and cannot clean, a caregiver or other person should only clean and disinfect the area around you (such as your bedroom and bathroom) on an as needed basis. Your caregiver/other person should wait as long as possible (at least several hours) and wear a mask before entering, cleaning, and disinfecting shared spaces that you use. Clean and disinfect areas that may have blood, stool, or body fluids on them. Use household cleaners and disinfectants. Clean visible dirty surfaces with household cleaners containing soap or detergent. Then, use a household disinfectant. Use a product from [employer] List N: Disinfectants for Coronavirus (COVID-19). Be sure to follow the instructions on the label to ensure safe and effective use of the product. Many products recommend keeping the surface wet with a disinfectant for a certain period of time (look at "contact time" on the product label). You may also need to wear personal protective equipment, such as gloves, depending on the directions on the product label. Immediately after disinfecting, wash your hands with soap and water for 20 seconds. For completed guidance on cleaning and disinfecting your  home, visit Complete Disinfection Guidance. Take steps to improve ventilation at home Improve ventilation (air flow) at home to help prevent from spreading COVID-19 to other people  in your household. Clear out COVID-19 virus particles in the air by opening windows, using air filters, and turning on fans in your home. Use this interactive tool to learn how to improve air flow in your home. When you can be around others after being sick with COVID-19 Deciding when you can be around others is different for different situations. Find out when you can safely end home isolation. For any additional questions about your care, contact your healthcare provider or state or local health department. 08/04/2020 Content source: [facility] ([facility]), Division of Viral Diseases This information is not intended to replace advice given to you by your health care provider. Make sure you discuss any questions you have with your health care provider. Document Revised: 09/17/2020 Document Reviewed: 09/17/2020 Elsevier Patient Education  2022 [employer].     If you have been instructed to have an in-person evaluation today at a local Urgent Care facility, please use the link below. It will take you to a list of all of our available [place], including address, phone number and hours of operation. Please do not delay care.  [facility]  If you or a family member do not have a primary care provider, use the link below to schedule a visit and establish care. When you choose a [place] primary care physician or advanced practice provider, you gain a long-term partner in health. Find a Primary Care Provider  Learn more about [facility]'s in-office and virtual care options: [facility] - Get Care Now

## 2022-12-10 NOTE — Progress Notes (Signed)
Virtual Visit Consent   UNEEDA LESKY, you are scheduled for a virtual visit with a [facility] provider today. Just as with appointments in the office, your consent must be obtained to participate. Your consent will be active for this visit and any virtual visit you may have with one of our providers in the next 365 days. If you have a MyChart account, a copy of this consent can be sent to you electronically.  As this is a virtual visit, video technology does not allow for your provider to perform a traditional examination. This may limit your provider's ability to fully assess your condition. If your provider identifies any concerns that need to be evaluated in person or the need to arrange testing (such as labs, EKG, etc.), we will make arrangements to do so. Although advances in technology are sophisticated, we cannot ensure that it will always work on either your end or our end. If the connection with a video visit is poor, the visit may have to be switched to a telephone visit. With either a video or telephone visit, we are not always able to ensure that we have a secure connection.  By engaging in this virtual visit, you consent to the provision of healthcare and authorize for your insurance to be billed (if applicable) for the services provided during this visit. Depending on your insurance coverage, you may receive a charge related to this service.  I need to obtain your verbal consent now. Are you willing to proceed with your visit today? Lisa [name] has provided verbal consent on 12/10/2022 for a virtual visit (video or telephone). Lisa Loveless, PA-C  Date: 12/10/2022 1:10 PM  Virtual Visit via Video Note   IMargaretann [name], connected with  Lisa [name]  ([id number], 30-Apr-1976) on 12/10/22 at  1:00 PM EDT by a video-enabled telemedicine application and verified that I am speaking with the correct person using two identifiers.  Location: Patient: Virtual Visit  Location Patient: Home Provider: Virtual Visit Location Provider: Home Office   I discussed the limitations of evaluation and management by telemedicine and the availability of in person appointments. The patient expressed understanding and agreed to proceed.    History of Present Illness: Lisa [name] is a 47 y.o. who identifies as a female who was assigned female at birth, and is being seen today for Covid 29.  HPI: URI  This is a new problem. The current episode started in the past 7 days (Tested positive for Covid 19 today; symptoms started on Wednesday night with a sore throat). The problem has been gradually worsening. There has been no fever. Associated symptoms include chest pain (tightness), congestion, coughing, headaches, rhinorrhea, sinus pain and a sore throat. Pertinent negatives include no diarrhea, ear pain, nausea, vomiting or wheezing. She has tried acetaminophen for the symptoms. The treatment provided no relief.     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   Chronic bronchitis (HCC) 04/04/2022   GERD (gastroesophageal reflux disease) 04/04/2022   Complication of anesthesia 03/08/2022   Family history of colon cancer requiring screening colonoscopy 02/24/2022   Trigger point of left shoulder region 10/06/2021   Cervical radiculitis 03/03/2020   Weakness of left arm 03/03/2020   Cervicalgia 02/28/2020   Spondylosis, cervical, with  myelopathy 02/28/2020   Loud snoring 08/01/2019   B12 deficiency 08/01/2019   Eczema of left external ear 08/01/2019   Hyperlipidemia associated with type 2 diabetes mellitus (HCC) 07/31/2019   Nonallopathic lesion of cervical region 04/29/2019   Nonallopathic lesion of thoracic region 04/29/2019   Nonallopathic  lesion of rib cage 04/29/2019   Cervical radiculopathy at C7 03/26/2019   Allergic rhinitis due to pollen 08/18/2018   Type 2 diabetes mellitus without complications (HCC) 05/02/2017   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 (Take nirmatrelvir 150 mg two tablets twice daily for 5 days and ritonavir 100 mg one tablet twice daily for 5 days) Patient GFR is 106, Disp: 30 tablet, Rfl: 0   albuterol (VENTOLIN HFA) 108 (90 Base) MCG/ACT inhaler, Inhale 2 puffs into the lungs every 6 (six) hours as needed for wheezing or shortness of breath., Disp: 6.7 g, Rfl: 0   cetirizine (ZYRTEC) 10 MG tablet, Take 10 mg by mouth daily., Disp: , Rfl:    cholecalciferol (VITAMIN D3) 25 MCG (1000 UNIT) tablet, Take 1,000 Units by mouth daily., Disp: , Rfl:    etonogestrel (NEXPLANON) 68 MG IMPL implant, 1 each by Subdermal route once., Disp: , Rfl:    Fluticasone-Umeclidin-Vilant (TRELEGY ELLIPTA) 100-62.5-25 MCG/ACT AEPB, Inhale 1 puff into the lungs daily., Disp: 60 each, Rfl: 11   folic acid (FOLVITE) 1 MG tablet, Take 1 tablet (1 mg total) by mouth daily., Disp: 90 tablet, Rfl: 1   omeprazole (PRILOSEC) 40 MG capsule, Take 1 capsule (40 mg total) by mouth daily., Disp: 90 capsule, Rfl: 1   Semaglutide, 1 MG/DOSE, 4 MG/3ML SOPN, Inject 1 mg into the skin once a week., Disp: 3 mL, Rfl: 2   Turmeric 400 MG CAPS, Take 400 mg by mouth daily. , Disp: , Rfl:   Observations/Objective: Patient is well-developed, well-nourished in no acute distress.   Resting comfortably at home.  Head is normocephalic, atraumatic.  No labored breathing.  Speech is clear and coherent with logical content.  Patient is alert and oriented at baseline.    Assessment and Plan: 1. COVID-19 - nirmatrelvir/ritonavir (PAXLOVID) 20 x 150 MG & 10 x 100MG  TABS; Take 3 tablets by mouth 2 (two) times daily for 5 days. (Take nirmatrelvir 150 mg two tablets twice daily for 5 days and ritonavir 100 mg one tablet twice daily for 5 days) Patient GFR is 106  Dispense: 30 tablet; Refill: 0 - MyChart COVID-19 home monitoring program; Future  - Continue OTC symptomatic management of choice - Will send OTC vitamins and supplement information through AVS - Paxlovid prescribed - Patient enrolled in MyChart symptom monitoring - Push fluids - Rest as needed - Discussed return precautions and when to seek in-person evaluation, sent via AVS as well   Follow Up Instructions: I discussed the assessment and treatment plan with the patient. The patient was provided an opportunity to ask questions and all were answered. The patient agreed with the plan and demonstrated  an understanding of the instructions.  A copy of instructions were sent to the patient via MyChart unless otherwise noted below.    The patient was advised to call back or seek an in-person evaluation if the symptoms worsen or if the condition fails to improve as anticipated.  Time:  I spent 13 minutes with the patient via telehealth technology discussing the above problems/concerns.    Lisa Loveless, PA-C

## 2022-12-15 ENCOUNTER — Other Ambulatory Visit: Payer: Self-pay

## 2022-12-15 ENCOUNTER — Telehealth: Payer: Self-pay | Admitting: Internal Medicine

## 2022-12-15 ENCOUNTER — Telehealth: Payer: Self-pay

## 2022-12-15 ENCOUNTER — Telehealth: Payer: Self-pay | Admitting: Pulmonary Disease

## 2022-12-15 MED ORDER — METHYLPREDNISOLONE 4 MG PO TBPK
ORAL_TABLET | ORAL | 0 refills | Status: DC
  Filled 2022-12-15: qty 21, 6d supply, fill #0

## 2022-12-15 MED ORDER — AZITHROMYCIN 250 MG PO TABS
ORAL_TABLET | ORAL | 0 refills | Status: DC
  Filled 2022-12-15: qty 6, 5d supply, fill #0

## 2022-12-15 NOTE — Telephone Encounter (Signed)
Called patient in regard to COVID symptoms of appetite and diarrhea, to offer advise. No answer.

## 2022-12-15 NOTE — Telephone Encounter (Signed)
Pt called stating she is on day eight with having covid and she want to know if there was anything else she can do with the phlegm in her throat

## 2022-12-15 NOTE — Telephone Encounter (Signed)
She can take the Mucinex twice a day.  Stay well-hydrated.  We can call in a Z-Pak and Medrol Dosepak to see if this helps her.

## 2022-12-15 NOTE — Telephone Encounter (Signed)
I have notified the patient and sent in the prescriptions to her pharmacy.  Nothing further needed.

## 2022-12-15 NOTE — Telephone Encounter (Signed)
Pt states on day 8 of covid is asking if anything else other than mucinex to take to loosen congestion

## 2022-12-15 NOTE — Telephone Encounter (Signed)
Tested positive for Covid on 7/27. Symptoms started on 12/07/2022.  No fevers, chills or sweats Cough, does not know what color sputum DOE. No wheezing.  She does have a lot of congestion. Wants to know what you suggest she take for it because she get bronchitis easily. She is taking Mucinex 1200mg  once a day.   Last dose of Paxlovid was yesterday Albuterol- has not had to use Trelegy- 1 puff every day. Mucinex 1200mg  Once a day  [facility]

## 2022-12-15 NOTE — Telephone Encounter (Signed)
Spoke to Patient with Dr. Melina Schools recommendations. Patient understands and is agreeable.

## 2022-12-18 ENCOUNTER — Encounter: Payer: Self-pay | Admitting: Internal Medicine

## 2022-12-28 DIAGNOSIS — M542 Cervicalgia: Secondary | ICD-10-CM | POA: Diagnosis not present

## 2022-12-28 NOTE — Progress Notes (Unsigned)
[name] [address] [place] [address] Phone: [phone] Subjective:   Lisa [name], am serving as a scribe for Dr. Antoine [name].  I'm seeing this patient by the request  of:  Lisa Shams, MD  CC: Back and neck pain follow-up  [id number]  Lisa [name] is a 47 y.o. female coming in with complaint of back and neck pain. OMT 11/16/2022. Patient states that she had COVID a few weeks ago. Switched jobs so she is not wearing lead as much. Going to new PT which has been helpful. Back pain is improving. Pain in neck today due to PT yesterday.   Medications patient has been prescribed: None          Reviewed prior external information including notes and imaging from previsou exam, outside providers and external EMR if available.   As well as notes that were available from care everywhere and other healthcare systems.  Past medical history, social, surgical and family history all reviewed in electronic medical record.  No pertanent information unless stated regarding to the chief complaint.   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 POSITIVE muscle aches  Objective  Blood pressure 104/64, pulse 100, height 5\' 4"  (1.626 m), weight 222 lb (100.7 kg), SpO2 99%.   General: No apparent distress alert and oriented x3 mood and affect normal, dressed appropriately.  HEENT: Pupils equal, extraocular movements intact  Respiratory: Patient's speak in full sentences and does not appear short of breath  Cardiovascular: No lower extremity edema, non tender, no erythema  Back exam does have some loss of lordosis noted.  Patient does have some tightness noted in the left parascapular area.  Negative impingement on the shoulder itself today.  Osteopathic findings  C2 flexed rotated and side bent right C6 flexed rotated and side bent left T3 extended rotated and side bent left inhaled rib L1 flexed rotated and side bent right L5 flexed rotated and side bent left Sacrum right on right    Assessment and Plan:  Slipped rib syndrome Slipped rib syndrome noted.  Discussed icing regimen and home exercises.  Left shoulder does seem to be improving.  Still wants to hold on the injection in the shoulder itself.  Concern still so that  some of it could be radiation from the neck.  Follow-up again in 6 to 8 weeks otherwise    Nonallopathic problems  Decision today to treat with OMT was based on Physical Exam  After verbal consent patient was treated with HVLA, ME, FPR techniques in cervical, rib, thoracic, lumbar, and sacral  areas  Patient tolerated the procedure well with improvement in symptoms  Patient given exercises, stretches and lifestyle modifications  See medications in patient instructions if  given  Patient will follow up in 4-8 weeks     The above documentation has been reviewed and is accurate and complete Lisa Saa, DO         Note: This dictation was prepared with Dragon dictation along with smaller phrase technology. Any transcriptional errors that result from this process are unintentional.

## 2022-12-29 ENCOUNTER — Encounter: Payer: Self-pay | Admitting: Family Medicine

## 2022-12-29 ENCOUNTER — Ambulatory Visit: Payer: 59 | Admitting: Family Medicine

## 2022-12-29 VITALS — BP 104/64 | HR 100 | Ht 64.0 in | Wt 222.0 lb

## 2022-12-29 DIAGNOSIS — M940 Chondrocostal junction syndrome [Tietze]: Secondary | ICD-10-CM

## 2022-12-29 DIAGNOSIS — M9901 Segmental and somatic dysfunction of cervical region: Secondary | ICD-10-CM | POA: Diagnosis not present

## 2022-12-29 DIAGNOSIS — M9904 Segmental and somatic dysfunction of sacral region: Secondary | ICD-10-CM | POA: Diagnosis not present

## 2022-12-29 DIAGNOSIS — M9908 Segmental and somatic dysfunction of rib cage: Secondary | ICD-10-CM

## 2022-12-29 DIAGNOSIS — M9903 Segmental and somatic dysfunction of lumbar region: Secondary | ICD-10-CM

## 2022-12-29 DIAGNOSIS — M9902 Segmental and somatic dysfunction of thoracic region: Secondary | ICD-10-CM

## 2022-12-29 NOTE — Assessment & Plan Note (Signed)
Slipped rib syndrome noted.  Discussed icing regimen and home exercises.  Left shoulder does seem to be improving.  Still wants to hold on the injection in the shoulder itself.  Concern still so that some of it could be radiation from the neck.  Follow-up again in 6 to 8 weeks otherwise

## 2022-12-29 NOTE — Patient Instructions (Signed)
Good to see you Keep working with PT See me in 2 months

## 2023-01-03 ENCOUNTER — Ambulatory Visit: Payer: 59 | Admitting: Family Medicine

## 2023-01-06 DIAGNOSIS — M542 Cervicalgia: Secondary | ICD-10-CM | POA: Diagnosis not present

## 2023-01-12 DIAGNOSIS — M542 Cervicalgia: Secondary | ICD-10-CM | POA: Diagnosis not present

## 2023-01-18 DIAGNOSIS — M542 Cervicalgia: Secondary | ICD-10-CM | POA: Diagnosis not present

## 2023-01-20 ENCOUNTER — Other Ambulatory Visit: Payer: Self-pay | Admitting: Internal Medicine

## 2023-01-20 DIAGNOSIS — Z1231 Encounter for screening mammogram for malignant neoplasm of breast: Secondary | ICD-10-CM

## 2023-01-22 ENCOUNTER — Other Ambulatory Visit: Payer: Self-pay

## 2023-01-22 ENCOUNTER — Other Ambulatory Visit: Payer: Self-pay | Admitting: Internal Medicine

## 2023-01-23 ENCOUNTER — Other Ambulatory Visit: Payer: Self-pay

## 2023-01-23 MED FILL — Omeprazole Cap Delayed Release 40 MG: ORAL | 90 days supply | Qty: 90 | Fill #0 | Status: AC

## 2023-01-24 DIAGNOSIS — M542 Cervicalgia: Secondary | ICD-10-CM | POA: Diagnosis not present

## 2023-01-27 ENCOUNTER — Other Ambulatory Visit: Payer: Self-pay

## 2023-02-01 DIAGNOSIS — M542 Cervicalgia: Secondary | ICD-10-CM | POA: Diagnosis not present

## 2023-02-08 DIAGNOSIS — M542 Cervicalgia: Secondary | ICD-10-CM | POA: Diagnosis not present

## 2023-02-14 ENCOUNTER — Ambulatory Visit: Payer: 59 | Attending: Pulmonary Disease

## 2023-02-14 DIAGNOSIS — J386 Stenosis of larynx: Secondary | ICD-10-CM | POA: Diagnosis not present

## 2023-02-14 DIAGNOSIS — R0602 Shortness of breath: Secondary | ICD-10-CM

## 2023-02-14 DIAGNOSIS — J4530 Mild persistent asthma, uncomplicated: Secondary | ICD-10-CM | POA: Diagnosis not present

## 2023-02-14 LAB — PULMONARY FUNCTION TEST ARMC ONLY
FEF 25-75 Pre: 4.68 L/s
FEF2575-%Pred-Pre: 163 %
FEV1-%Pred-Pre: 132 %
FEV1-Pre: 3.71 L
FEV1FVC-%Pred-Pre: 107 %
FEV6-%Pred-Pre: 124 %
FEV6-Pre: 4.26 L
FEV6FVC-%Pred-Pre: 102 %
FVC-%Pred-Pre: 122 %
FVC-Pre: 4.26 L
Pre FEV1/FVC ratio: 87 %
Pre FEV6/FVC Ratio: 100 %

## 2023-02-15 ENCOUNTER — Ambulatory Visit (INDEPENDENT_AMBULATORY_CARE_PROVIDER_SITE_OTHER): Payer: 59 | Admitting: Pulmonary Disease

## 2023-02-15 ENCOUNTER — Encounter: Payer: Self-pay | Admitting: Pulmonary Disease

## 2023-02-15 VITALS — BP 118/76 | HR 83 | Temp 98.2°F | Ht 64.0 in | Wt 224.6 lb

## 2023-02-15 DIAGNOSIS — J386 Stenosis of larynx: Secondary | ICD-10-CM

## 2023-02-15 DIAGNOSIS — J383 Other diseases of vocal cords: Secondary | ICD-10-CM | POA: Diagnosis not present

## 2023-02-15 DIAGNOSIS — M542 Cervicalgia: Secondary | ICD-10-CM | POA: Diagnosis not present

## 2023-02-15 DIAGNOSIS — R0602 Shortness of breath: Secondary | ICD-10-CM

## 2023-02-15 DIAGNOSIS — J4530 Mild persistent asthma, uncomplicated: Secondary | ICD-10-CM | POA: Diagnosis not present

## 2023-02-15 LAB — NITRIC OXIDE: Nitric Oxide: 10

## 2023-02-15 NOTE — Progress Notes (Signed)
Subjective:    Patient ID: Lisa [name], female    DOB: 05/13/1976, 47 y.o.   MRN: [id number]  Patient Care Team: Sherlene Shams, MD as PCP - General (Internal Medicine) Salena Saner, MD as Consulting Physician (Pulmonary Disease)  Chief Complaint  Patient presents with   Follow-up    No SOB, wheezing or cough. Had Covid in July.    HPI Lisa [name] is a 47 year old lifelong never smoker with a prior history of subglottic stenosis status tracheal dilation in 2014 and redo dilation in 2021 by Dr. Christia Reading, resents for evaluation of recurrent dyspnea on exertion.  I initially saw this patient in June 2023 for a similar issue post an episode of bronchitis.  I last saw her 01 November 2022 at that time we ordered a CT soft tissue neck without contrast to follow-up on her upper airway issues.  At COVID-19 in July and required Paxlovid, Medrol dose pack and azithromycin.  She did resolve that illness without need to be admitted.  She has not had any fevers, chills or sweats since that episode.  She has been on Trelegy Ellipta 100 which she feels helps her. As noted she has a history of subglottic stenosis and has required tracheal dilation previously.  She had CT soft tissue neck on 5 June that showed that the glottis was held in voluntary closure.  The subglottic region there was asymmetric prominence of the cricoid cartilage to the right of the midline which indents in the airway slightly.  There did not appear to be any measurable stenosis.  May be a tiny diverticulum.  Direct inspection was recommended.  She did subsequently see Dr. Jenne Pane on 11 September who did not feel that direct inspection was needed.   With regards to her cough this is improving after antibiotics and with the Trelegy.  No hemoptysis.  Her main complaint is that of shortness of breath with exertion and fatigue.  Wheezing has subsided with Trelegy.  She has been able to wear her surgical masks again at work without  difficulty.  Her gastroesophageal reflux symptoms are controlled.  She does not endorse any other symptomatology.  No fevers chills or sweats.   She had spirometry and flow-volume loop performed 14 February 2023 that shows normal FEV1 and FVC.  The flow-volume loop was suggestive of variable extrathoracic upper airway obstruction.      Review of Systems A 10 point review of systems was performed and it is as noted above otherwise negative.   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   Chronic bronchitis (HCC) 04/04/2022   GERD (gastroesophageal reflux disease) 04/04/2022   Complication of anesthesia 03/08/2022   Family history of colon cancer requiring screening colonoscopy 02/24/2022   Trigger point of left shoulder region 10/06/2021   Cervical radiculitis 03/03/2020   Weakness of left arm 03/03/2020   Cervicalgia 02/28/2020   Spondylosis, cervical, 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   Loud snoring 08/01/2019   B12 deficiency 08/01/2019   Eczema of left external ear 08/01/2019   Hyperlipidemia associated with type 2 diabetes mellitus (HCC) 07/31/2019   Nonallopathic lesion of cervical region 04/29/2019   Nonallopathic lesion of thoracic region 04/29/2019   Nonallopathic lesion of rib cage 04/29/2019   Cervical radiculopathy at C7 03/26/2019   Allergic rhinitis  due to pollen 08/18/2018   Type 2 diabetes mellitus without complications (HCC) 05/02/2017   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   Encounter for screening colonoscopy 04/29/2016   Low back pain without sciatica 04/28/2015   Morbid obesity (HCC) 04/28/2015    Social History   Tobacco Use   Smoking status: Never   Smokeless tobacco: Never  Substance Use Topics   Alcohol use: Yes    Alcohol/week: 2.0 standard drinks of  alcohol    Types: 1 Glasses of wine, 1 Cans of beer per week    Comment: "social"    Allergies  Allergen Reactions   Chlorhexidine Gluconate     Developed pruritic rash on abdomen from prep for lap chole Mar 09 2022   Latex Hives    Rash and hives    Current Meds  Medication Sig   albuterol (VENTOLIN HFA) 108 (90 Base) MCG/ACT inhaler Inhale 2 puffs into the lungs every 6 (six) hours as needed for wheezing or shortness of breath.   cetirizine (ZYRTEC) 10 MG tablet Take 10 mg by mouth daily.   cholecalciferol (VITAMIN D3) 25 MCG (1000 UNIT) tablet Take 1,000 Units by mouth daily.   etonogestrel (NEXPLANON) 68 MG IMPL implant 1 each by Subdermal route once.   Fluticasone-Umeclidin-Vilant (TRELEGY ELLIPTA) 100-62.5-25 MCG/ACT AEPB Inhale 1 puff into the lungs daily.   folic acid (FOLVITE) 1 MG tablet Take 1 tablet (1 mg total) by mouth daily.   omeprazole (PRILOSEC) 40 MG capsule Take 1 capsule (40 mg total) by mouth daily.   Semaglutide, 1 MG/DOSE, 4 MG/3ML SOPN Inject 1 mg into the skin once a week.   Turmeric 400 MG CAPS Take 400 mg by mouth daily.     Immunization History  Administered Date(s) Administered   Influenza Split 02/25/2015, 01/30/2017   Influenza-Unspecified 02/09/2016, 01/21/2018, 02/12/2019, 03/13/2020, 02/10/2021, 02/10/2022, 02/07/2023   Moderna Sars-Covid-2 Vaccination 05/06/2019, 05/27/2019, 05/12/2020   PNEUMOCOCCAL CONJUGATE-20 01/07/2021   Tdap 04/27/2016        Objective:     BP 118/76 (BP Location: Right Arm, Cuff Size: Normal)   Pulse 83   Temp 98.2 F (36.8 C)   Ht 5\' 4"  (1.626 m)   Wt 224 lb 9.6 oz (101.9 kg)   SpO2 97%   BMI 38.55 kg/m   SpO2: 97 % O2 Device: None (Room air)  GENERAL: Obese woman, no acute distress, fully ambulatory. No conversational dyspnea. HEAD: Normocephalic, atraumatic.  EYES: Pupils equal, round, reactive to light. No scleral icterus.  MOUTH: Nose/mouth/throat not examined due to institutional masking  requirements. NECK: Supple. No thyromegaly. Trachea midline. No JVD. No adenopathy. No stridor. PULMONARY: Good air entry bilaterally. No adventitious sounds. CARDIOVASCULAR: S1 and S2. Regular rate and rhythm.  ABDOMEN: Obese, otherwise benign. MUSCULOSKELETAL: No joint deformity, no clubbing, no edema.  NEUROLOGIC: No overt focal deficit, no gait disturbance, speech is fluent. SKIN: Intact,warm,dry. PSYCH: Mood and behavior normal.    Flow-volume loop from spirometry performed 14 February 2023 variable extrathoracic upper airway obstruction:  Lab Results  Component Value Date   NITRICOXIDE 10 02/15/2023     Assessment & Plan:     ICD-10-CM   1. 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 Vocal cord dysfunction  J38.3    May consider  speech pathology evaluation Recommended harmonica exercises      Orders Placed This Encounter  Procedures   Nitric oxide   We will see the patient in follow-up in 6 months time she is to contact us prior to that time should any new difficulties arise.  Gailen Shelter, MD Advanced Bronchoscopy PCCM [facility]    *This note was dictated using voice recognition software/Dragon.  Despite best efforts to proofread, errors can occur which can change the meaning. Any transcriptional errors that result from this process are unintentional and may not be fully corrected at the time of dictation.

## 2023-02-15 NOTE — Patient Instructions (Addendum)
Continue using your Trelegy as you are doing.  Continue as needed albuterol.  Rinse your mouth well after use Trelegy.  You may use a little baking soda in the water to help neutralize the powdery residue.  We discussed your breathing test from yesterday.  Your lung volumes are excellent.  On the flow-volume loop it shows possible vocal cord dysfunction.  Recommend doing some simple gentle exercises by blowing in and out of a harmonica, this may help some.  We can also consider speech therapy if that is something you are interested in.  Your level of inflammation in the airway was low today which is good.  We will see you in follow-up in 6 months time call sooner should any new problems arise.

## 2023-02-16 ENCOUNTER — Ambulatory Visit
Admission: RE | Admit: 2023-02-16 | Discharge: 2023-02-16 | Disposition: A | Discharge status: Home / Self Care | Payer: 59 | Source: Ambulatory Visit | Attending: Internal Medicine | Admitting: Internal Medicine

## 2023-02-16 ENCOUNTER — Other Ambulatory Visit: Payer: Self-pay | Admitting: Internal Medicine

## 2023-02-16 DIAGNOSIS — N644 Mastodynia: Secondary | ICD-10-CM

## 2023-02-16 DIAGNOSIS — Z1231 Encounter for screening mammogram for malignant neoplasm of breast: Secondary | ICD-10-CM | POA: Insufficient documentation

## 2023-02-22 DIAGNOSIS — M542 Cervicalgia: Secondary | ICD-10-CM | POA: Diagnosis not present

## 2023-02-23 NOTE — Progress Notes (Unsigned)
[name] [address] [place] [address] Phone: [phone] Subjective:   Lisa [name], am serving as a scribe for Dr. Antoine Primas.  I'm seeing this patient by the request  of:  Sherlene Shams, MD  CC: Upper back pain  HQI:[id number]  Lisa [name] is a 47 y.o. female coming in with complaint of back and neck pain. OMT on 12/29/2022. Patient states same per usual. No new concerns. Doing a bit better.  Feels like she is making progress.           Reviewed prior external information including notes and imaging from previsou exam, outside providers and external EMR if available.   As well as notes that were available from care everywhere and other healthcare systems.  Past medical history, social, surgical and family history all reviewed in electronic medical record.  No pertanent information unless stated regarding to the chief complaint.   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 POSITIVE muscle aches  Objective  Blood pressure 102/68, pulse (!) 105, height 5\' 4"  (1.626 m), weight 226 lb (102.5 kg), SpO2 97%.   General: No apparent distress alert and oriented x3 mood and affect normal, dressed appropriately.  HEENT: Pupils equal, extraocular movements intact  Respiratory: Patient's speak in full sentences and does not appear short of breath  Cardiovascular: No lower extremity edema, non tender, no erythema  Upper back does have tightness noted.  Some tenderness to palpation of the paraspinal musculature.  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  Left side, did have a respiratory illness that likely did contribute to this.  Has been working with other people as well.  Discussed which activities to do and which ones to avoid.  Increase activity slowly.  Follow-up again in 6 to 8 weeks.    Nonallopathic problems  Decision today to treat with OMT was based on Physical  Exam  After verbal consent patient was treated with HVLA, ME, FPR techniques in cervical, rib, thoracic, lumbar, and sacral  areas  Patient tolerated the procedure well with improvement in symptoms  Patient given exercises, stretches and lifestyle modifications  See medications in patient instructions if given  Patient will follow up in 4-8 weeks     The above documentation has been reviewed and is accurate and complete [name], DO         Note: This dictation  was prepared with Dragon dictation along with smaller phrase technology. Any transcriptional errors that result from this process are unintentional.

## 2023-02-27 ENCOUNTER — Ambulatory Visit
Admission: RE | Admit: 2023-02-27 | Discharge: 2023-02-27 | Disposition: A | Discharge status: Home / Self Care | Payer: 59 | Source: Ambulatory Visit | Attending: Internal Medicine | Admitting: Internal Medicine

## 2023-02-27 DIAGNOSIS — N644 Mastodynia: Secondary | ICD-10-CM | POA: Diagnosis not present

## 2023-02-27 DIAGNOSIS — R92313 Mammographic fatty tissue density, bilateral breasts: Secondary | ICD-10-CM | POA: Diagnosis not present

## 2023-02-28 ENCOUNTER — Encounter: Payer: Self-pay | Admitting: Family Medicine

## 2023-02-28 ENCOUNTER — Ambulatory Visit: Payer: 59 | Admitting: Family Medicine

## 2023-02-28 VITALS — BP 102/68 | HR 105 | Ht 64.0 in | Wt 226.0 lb

## 2023-02-28 DIAGNOSIS — M9901 Segmental and somatic dysfunction of cervical region: Secondary | ICD-10-CM | POA: Diagnosis not present

## 2023-02-28 DIAGNOSIS — M9904 Segmental and somatic dysfunction of sacral region: Secondary | ICD-10-CM | POA: Diagnosis not present

## 2023-02-28 DIAGNOSIS — M9908 Segmental and somatic dysfunction of rib cage: Secondary | ICD-10-CM | POA: Diagnosis not present

## 2023-02-28 DIAGNOSIS — M940 Chondrocostal junction syndrome [Tietze]: Secondary | ICD-10-CM

## 2023-02-28 DIAGNOSIS — M9902 Segmental and somatic dysfunction of thoracic region: Secondary | ICD-10-CM

## 2023-02-28 DIAGNOSIS — M9903 Segmental and somatic dysfunction of lumbar region: Secondary | ICD-10-CM

## 2023-02-28 NOTE — Patient Instructions (Signed)
Good to see you Please be proud See me again 6-8 weeks

## 2023-02-28 NOTE — Assessment & Plan Note (Signed)
Unfortunately exacerbation noted again today.  Left side, did have a respiratory illness that likely did contribute to this.  Has been working with other people as well.  Discussed which activities to do and which ones to avoid.  Increase activity slowly.  Follow-up again in 6 to 8 weeks.

## 2023-03-01 DIAGNOSIS — M542 Cervicalgia: Secondary | ICD-10-CM | POA: Diagnosis not present

## 2023-03-02 ENCOUNTER — Other Ambulatory Visit: Payer: Self-pay

## 2023-03-15 DIAGNOSIS — M542 Cervicalgia: Secondary | ICD-10-CM | POA: Diagnosis not present

## 2023-03-22 DIAGNOSIS — M542 Cervicalgia: Secondary | ICD-10-CM | POA: Diagnosis not present

## 2023-03-28 DIAGNOSIS — M542 Cervicalgia: Secondary | ICD-10-CM | POA: Diagnosis not present

## 2023-03-29 ENCOUNTER — Other Ambulatory Visit: Payer: Self-pay

## 2023-03-29 ENCOUNTER — Ambulatory Visit (INDEPENDENT_AMBULATORY_CARE_PROVIDER_SITE_OTHER): Payer: 59 | Admitting: Internal Medicine

## 2023-03-29 ENCOUNTER — Encounter: Payer: Self-pay | Admitting: Internal Medicine

## 2023-03-29 ENCOUNTER — Ambulatory Visit: Payer: 59 | Admitting: Internal Medicine

## 2023-03-29 VITALS — BP 120/82 | HR 99 | Ht 64.0 in | Wt 222.8 lb

## 2023-03-29 DIAGNOSIS — E119 Type 2 diabetes mellitus without complications: Secondary | ICD-10-CM

## 2023-03-29 DIAGNOSIS — E785 Hyperlipidemia, unspecified: Secondary | ICD-10-CM | POA: Diagnosis not present

## 2023-03-29 DIAGNOSIS — E538 Deficiency of other specified B group vitamins: Secondary | ICD-10-CM

## 2023-03-29 DIAGNOSIS — R5381 Other malaise: Secondary | ICD-10-CM | POA: Diagnosis not present

## 2023-03-29 DIAGNOSIS — E1169 Type 2 diabetes mellitus with other specified complication: Secondary | ICD-10-CM

## 2023-03-29 DIAGNOSIS — Z7985 Long-term (current) use of injectable non-insulin antidiabetic drugs: Secondary | ICD-10-CM

## 2023-03-29 DIAGNOSIS — R29898 Other symptoms and signs involving the musculoskeletal system: Secondary | ICD-10-CM | POA: Diagnosis not present

## 2023-03-29 MED ORDER — SEMAGLUTIDE (2 MG/DOSE) 8 MG/3ML ~~LOC~~ SOPN
2.0000 mg | PEN_INJECTOR | SUBCUTANEOUS | 2 refills | Status: DC
  Filled 2023-03-29: qty 3, 28d supply, fill #0
  Filled 2023-04-21: qty 3, 28d supply, fill #1
  Filled 2023-05-12: qty 3, 28d supply, fill #2

## 2023-03-29 NOTE — Progress Notes (Unsigned)
Subjective:  Patient ID: Lisa [name], female    DOB: 1976-04-25  Age: 47 y.o. MRN: [id number]  CC: The primary encounter diagnosis was Type 2 diabetes mellitus without complication, without long-term current use of insulin (HCC). Diagnoses of Hyperlipidemia associated with type 2 diabetes mellitus (HCC), Morbid obesity (HCC), B12 deficiency, Weakness of left arm, and Physical deconditioning were also pertinent to this visit.   HPI ALBINA PERETZ presents for  Chief Complaint  Patient presents with   Medical Management of Chronic Issues    6 month follow up    1) Dibetes/Morbid obesity: she is taking Wegovy 1.7 mg weekly ;  weight is down 3 lbs from last month.  Starting weight was 297 lb in oct 2021 .has been participating in PT since changing jobs.for strengthening of her arms and back  .    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  No longer wearing lead.!  Has started PT for strengthening,  pain is more manageable .  Less headaches and neck pain   4) Breast pain:  screening mammogram was changed to diagnostic when she reported left sided breast pain . Diagnostic mammogram and ultrasound reviewed    Outpatient Medications Prior to Visit  Medication Sig Dispense Refill   albuterol (VENTOLIN HFA) 108 (90 Base) MCG/ACT inhaler Inhale 2 puffs into the lungs every 6 (six) hours as needed for wheezing or shortness of breath. 6.7 g 0   cetirizine (ZYRTEC) 10 MG tablet Take 10 mg by mouth daily.     cholecalciferol (VITAMIN D3) 25 MCG (1000 UNIT) tablet Take 1,000 Units by mouth daily.     etonogestrel (NEXPLANON) 68 MG IMPL implant 1 each by Subdermal route once.     Fluticasone-Umeclidin-Vilant (TRELEGY ELLIPTA) 100-62.5-25 MCG/ACT AEPB Inhale 1 puff into the lungs daily. 60 each 11   folic acid (FOLVITE) 1  MG tablet Take 1 tablet (1 mg total) by mouth daily. 90 tablet 1   omeprazole (PRILOSEC) 40 MG capsule Take 1 capsule (40 mg total) by mouth daily. 90 capsule 1   Turmeric 400 MG CAPS Take 400 mg by mouth daily.      Semaglutide, 1 MG/DOSE, 4 MG/3ML SOPN Inject 1 mg into the skin once a week. 3 mL 2   azithromycin (ZITHROMAX) 250 MG tablet Take 2 tablets (500 mg) on  Day 1,  followed by 1 tablet (250 mg) once daily on Days 2 through 5. (Patient not taking: Reported on 02/15/2023) 6 each 0   methylPREDNISolone (MEDROL DOSEPAK) 4 MG TBPK tablet Use as directed (Patient not taking: Reported on 02/15/2023) 21 each 0   No facility-administered medications prior to visit.    Review of Systems;  Patient denies headache, fevers, malaise, unintentional weight loss, skin rash, eye pain, sinus congestion and sinus pain, sore throat, dysphagia,  hemoptysis , cough, dyspnea, wheezing, chest pain, palpitations, orthopnea, edema, abdominal pain, nausea, melena, diarrhea, constipation, flank pain, dysuria, hematuria, urinary  Frequency, nocturia, numbness, tingling, seizures,  Focal weakness, Loss of consciousness,  Tremor, insomnia, depression, anxiety, and suicidal ideation.      Objective:  BP 120/82   Pulse 99   Ht 5\' 4"  (1.626 m)   Wt 222 lb 12.8 oz (101.1 kg)   SpO2 99%  BMI 38.24 kg/m   BP Readings from Last 3 Encounters:  03/29/23 120/82  02/28/23 102/68  02/15/23 118/76    Wt Readings from Last 3 Encounters:  03/29/23 222 lb 12.8 oz (101.1 kg)  02/28/23 226 lb (102.5 kg)  02/15/23 224 lb 9.6 oz (101.9 kg)    Physical Exam Vitals reviewed.  Constitutional:      General: She is not in acute distress.    Appearance: Normal appearance. She is normal weight. She is not ill-appearing, toxic-appearing or diaphoretic.  HENT:     Head: Normocephalic.  Eyes:     General: No scleral icterus.       Right eye: No discharge.        Left eye: No discharge.     Conjunctiva/sclera:  Conjunctivae normal.  Cardiovascular:     Rate and Rhythm: Normal rate and regular rhythm.     Heart sounds: Normal heart sounds.  Pulmonary:     Effort: Pulmonary effort is normal. No respiratory distress.     Breath sounds: Normal breath sounds.  Musculoskeletal:        General: Normal range of motion.  Skin:    General: Skin is warm and dry.  Neurological:     General: No focal deficit present.     Mental Status: She is alert and oriented to person, place, and time. Mental status is at baseline.  Psychiatric:        Mood and Affect: Mood normal.        Behavior: Behavior normal.        Thought Content: Thought content normal.        Judgment: Judgment normal.    Lab Results  Component Value Date   HGBA1C 5.5 03/29/2023   HGBA1C 5.7 06/23/2022   HGBA1C 5.6 02/24/2022    Lab Results  Component Value Date   CREATININE 0.71 03/29/2023   CREATININE 0.65 08/31/2022   CREATININE 0.56 08/17/2022    Lab Results  Component Value Date   WBC 6.8 08/31/2022   HGB 14.0 08/31/2022   HCT 41.9 08/31/2022   PLT 211.0 08/31/2022   GLUCOSE 79 03/29/2023   CHOL 182 03/29/2023   TRIG 147.0 03/29/2023   HDL 43.40 03/29/2023   LDLDIRECT 116.0 03/29/2023   LDLCALC 110 (H) 03/29/2023   ALT 16 03/29/2023   AST 17 03/29/2023   NA 138 03/29/2023   K 3.7 03/29/2023   CL 106 03/29/2023   CREATININE 0.71 03/29/2023   BUN 11 03/29/2023   CO2 23 03/29/2023   TSH 2.03 04/04/2022   HGBA1C 5.5 03/29/2023   MICROALBUR <0.7 06/23/2022    MM 3D DIAGNOSTIC MAMMOGRAM BILATERAL BREAST  Result Date: 02/27/2023 CLINICAL DATA:  Patient reports focal pain in the left breast for approximately 1 month. Patient states that the pain has improved since she initially noticed it. She denies any associated palpable abnormality or nipple discharge. Patient is due for annual exam. EXAM: DIGITAL DIAGNOSTIC BILATERAL MAMMOGRAM WITH TOMOSYNTHESIS AND CAD; ULTRASOUND LEFT BREAST LIMITED; ULTRASOUND RIGHT BREAST  LIMITED TECHNIQUE: Bilateral digital diagnostic mammography and breast tomosynthesis was performed. The images were evaluated with computer-aided detection. ; Targeted ultrasound examination of the left breast was performed.; Targeted ultrasound examination of the right breast was performed COMPARISON:  Previous exam(s). ACR Breast Density Category a: The breasts are almost entirely fatty. FINDINGS: LEFT breast: A metallic BB applied to the skin denotes the patient indicated area of focal pain in the upper inner left breast at anterior depth. No  underlying mammographic abnormality. No suspicious mass, calcification, or other findings in the left breast. At the time of my clinical encounter, the patient clarifies that the pain is primarily in the retroareolar breast. On physical exam, I do not appreciate a suspicious mass in the retroareolar left breast. Targeted ultrasound of the left breast at the 10 o'clock position 3 cm from the nipple and of the retroareolar left breast, at the patient indicated areas of pain, was performed. No suspicious solid or cystic mass. RIGHT breast: A questioned asymmetry in the upper central right breast at middle depth nearly completely effaces on some views and dissipates on other views, suggestive of overlapping fibroglandular tissue. No suspicious mass, calcifications or other findings in the right breast. Confirmatory targeted ultrasound of the right breast at 12 o'clock position 3-4 cm from nipple, at the area of the questioned mammographic asymmetry, was performed. No suspicious solid or cystic mass. IMPRESSION: 1. No mammographic or sonographic abnormality at the patient indicated area of pain in the left breast. 2. No findings of malignancy in either breast. RECOMMENDATION: Any further workup of the patient's symptoms should be based on the clinical assessment. Recommend routine annual screening mammogram in 1 year. I discussed with the patient that breast pain is a common  condition and rarely a sign of breast cancer. It can be exacerbated by hormonal changes, hormonal medications, caffeine, and weight changes. It can be improved by wearing adequate support, over-the-counter pain medication, low-fat diet, exercise, and ice as needed. Topical medications containing diclofenac or lidocaine may provide temporary pain relief. Studies have shown an improvement in cyclic pain with use of evening primrose oil and vitamin E. Often breast pain will resolve on its own without intervention. I have discussed the findings and recommendations with the patient. If applicable, a reminder letter will be sent to the patient regarding the next appointment. BI-RADS CATEGORY  1: Negative. Electronically Signed   By: Sherron Ales M.D.   On: 02/27/2023 11:59   [place] LIMITED ULTRASOUND INCLUDING AXILLA LEFT BREAST   Result Date: 02/27/2023 CLINICAL DATA:  Patient reports focal pain in the left breast for approximately 1 month. Patient states that the pain has improved since she initially noticed it. She denies any associated palpable abnormality or nipple discharge. Patient is due for annual exam. EXAM: DIGITAL DIAGNOSTIC BILATERAL MAMMOGRAM WITH TOMOSYNTHESIS AND CAD; ULTRASOUND LEFT BREAST LIMITED; ULTRASOUND RIGHT BREAST LIMITED TECHNIQUE: Bilateral digital diagnostic mammography and breast tomosynthesis was performed. The images were evaluated with computer-aided detection. ; Targeted ultrasound examination of the left breast was performed.; Targeted ultrasound examination of the right breast was performed COMPARISON:  Previous exam(s). ACR Breast Density Category a: The breasts are almost entirely fatty. FINDINGS: LEFT breast: A metallic BB applied to the skin denotes the patient indicated area of focal pain in the upper inner left breast at anterior depth. No underlying mammographic abnormality. No suspicious mass, calcification, or other findings in the left breast. At the time of my clinical  encounter, the patient clarifies that the pain is primarily in the retroareolar breast. On physical exam, I do not appreciate a suspicious mass in the retroareolar left breast. Targeted ultrasound of the left breast at the 10 o'clock position 3 cm from the nipple and of the retroareolar left breast, at the patient indicated areas of pain, was performed. No suspicious solid or cystic mass. RIGHT breast: A questioned asymmetry in the upper central right breast at middle depth nearly completely effaces on some views and dissipates on  other views, suggestive of overlapping fibroglandular tissue. No suspicious mass, calcifications or other findings in the right breast. Confirmatory targeted ultrasound of the right breast at 12 o'clock position 3-4 cm from nipple, at the area of the questioned mammographic asymmetry, was performed. No suspicious solid or cystic mass. IMPRESSION: 1. No mammographic or sonographic abnormality at the patient indicated area of pain in the left breast. 2. No findings of malignancy in either breast. RECOMMENDATION: Any further workup of the patient's symptoms should be based on the clinical assessment. Recommend routine annual screening mammogram in 1 year. I discussed with the patient that breast pain is a common condition and rarely a sign of breast cancer. It can be exacerbated by hormonal changes, hormonal medications, caffeine, and weight changes. It can be improved by wearing adequate support, over-the-counter pain medication, low-fat diet, exercise, and ice as needed. Topical medications containing diclofenac or lidocaine may provide temporary pain relief. Studies have shown an improvement in cyclic pain with use of evening primrose oil and vitamin E. Often breast pain will resolve on its own without intervention. I have discussed the findings and recommendations with the patient. If applicable, a reminder letter will be sent to the patient regarding the next appointment. BI-RADS  CATEGORY  1: Negative. Electronically Signed   By: Sherron Ales M.D.   On: 02/27/2023 11:59   [place] LIMITED ULTRASOUND INCLUDING AXILLA RIGHT BREAST  Result Date: 02/27/2023 CLINICAL DATA:  Patient reports focal pain in the left breast for approximately 1 month. Patient states that the pain has improved since she initially noticed it. She denies any associated palpable abnormality or nipple discharge. Patient is due for annual exam. EXAM: DIGITAL DIAGNOSTIC BILATERAL MAMMOGRAM WITH TOMOSYNTHESIS AND CAD; ULTRASOUND LEFT BREAST LIMITED; ULTRASOUND RIGHT BREAST LIMITED TECHNIQUE: Bilateral digital diagnostic mammography and breast tomosynthesis was performed. The images were evaluated with computer-aided detection. ; Targeted ultrasound examination of the left breast was performed.; Targeted ultrasound examination of the right breast was performed COMPARISON:  Previous exam(s). ACR Breast Density Category a: The breasts are almost entirely fatty. FINDINGS: LEFT breast: A metallic BB applied to the skin denotes the patient indicated area of focal pain in the upper inner left breast at anterior depth. No underlying mammographic abnormality. No suspicious mass, calcification, or other findings in the left breast. At the time of my clinical encounter, the patient clarifies that the pain is primarily in the retroareolar breast. On physical exam, I do not appreciate a suspicious mass in the retroareolar left breast. Targeted ultrasound of the left breast at the 10 o'clock position 3 cm from the nipple and of the retroareolar left breast, at the patient indicated areas of pain, was performed. No suspicious solid or cystic mass. RIGHT breast: A questioned asymmetry in the upper central right breast at middle depth nearly completely effaces on some views and dissipates on other views, suggestive of overlapping fibroglandular tissue. No suspicious mass, calcifications or other findings in the right breast. Confirmatory  targeted ultrasound of the right breast at 12 o'clock position 3-4 cm from nipple, at the area of the questioned mammographic asymmetry, was performed. No suspicious solid or cystic mass. IMPRESSION: 1. No mammographic or sonographic abnormality at the patient indicated area of pain in the left breast. 2. No findings of malignancy in either breast. RECOMMENDATION: Any further workup of the patient's symptoms should be based on the clinical assessment. Recommend routine annual screening mammogram in 1 year. I discussed with the patient that breast pain is a common  condition and rarely a sign of breast cancer. It can be exacerbated by hormonal changes, hormonal medications, caffeine, and weight changes. It can be improved by wearing adequate support, over-the-counter pain medication, low-fat diet, exercise, and ice as needed. Topical medications containing diclofenac or lidocaine may provide temporary pain relief. Studies have shown an improvement in cyclic pain with use of evening primrose oil and vitamin E. Often breast pain will resolve on its own without intervention. I have discussed the findings and recommendations with the patient. If applicable, a reminder letter will be sent to the patient regarding the next appointment. BI-RADS CATEGORY  1: Negative. Electronically Signed   By: Sherron Ales M.D.   On: 02/27/2023 11:59    Assessment & Plan:  .Type 2 diabetes mellitus without complication, without long-term current use of insulin (HCC) Assessment & Plan: Taking Wegovy 1.7 mg dose having finished the  1.0 mg dose using  samples of the 0.25/0.5 pen .  Orders: -     Hemoglobin A1c -     Comprehensive metabolic panel  Hyperlipidemia associated with type 2 diabetes mellitus (HCC) Assessment & Plan: 10 yr risk is low  Based on prior labs.  Repeat labs reviewed; ;  advising trial of statin for primary prevention   Lab Results  Component Value Date   CHOL 182 03/29/2023   HDL 43.40 03/29/2023    LDLCALC 110 (H) 03/29/2023   LDLDIRECT 116.0 03/29/2023   TRIG 147.0 03/29/2023   CHOLHDL 4 03/29/2023     Orders: -     Lipid panel -     LDL cholesterol, direct  Morbid obesity (HCC) Assessment & Plan: Slowly  Improving BMI with appetite suppression.  Encouraged to start exercising regularly and increase dose of wegovy  to 2.0 mg dose  Lab Results  Component Value Date   LIPASE 36.0 08/31/2022   Lab Results  Component Value Date   ALT 16 03/29/2023   AST 17 03/29/2023   ALKPHOS 61 03/29/2023   BILITOT 0.7 03/29/2023      B12 deficiency -     B12 and Folate Panel  Weakness of left arm Assessment & Plan: Improving with PT and cessation of wearing lead apron    Physical deconditioning Assessment & Plan: Counselled that her exertional dyspnea is, due in part to deconditioning , encouraged to start exercising    Other orders -     Semaglutide (2 MG/DOSE); Inject 2 mg as directed once a week.  Dispense: 3 mL; Refill: 2     I provided 30 minutes of face-to-face time during this encounter reviewing patient's last visit with me, patient's  most recent visit with cardiology,  nephrology,  and neurology,  recent surgical and non surgical procedures, previous  labs and imaging studies, counseling on currently addressed issues,  and post visit ordering to diagnostics and therapeutics .   Follow-up: No follow-ups on file.   Sherlene Shams, MD

## 2023-03-30 ENCOUNTER — Other Ambulatory Visit: Payer: Self-pay

## 2023-03-30 LAB — COMPREHENSIVE METABOLIC PANEL
ALT: 16 U/L (ref 0–35)
AST: 17 U/L (ref 0–37)
Albumin: 4.5 g/dL (ref 3.5–5.2)
Alkaline Phosphatase: 61 U/L (ref 39–117)
BUN: 11 mg/dL (ref 6–23)
CO2: 23 meq/L (ref 19–32)
Calcium: 9.5 mg/dL (ref 8.4–10.5)
Chloride: 106 meq/L (ref 96–112)
Creatinine, Ser: 0.71 mg/dL (ref 0.40–1.20)
GFR: 102.1 mL/min (ref >60.00)
Glucose, Bld: 79 mg/dL (ref 70–99)
Potassium: 3.7 meq/L (ref 3.5–5.1)
Sodium: 138 meq/L (ref 135–145)
Total Bilirubin: 0.7 mg/dL (ref 0.2–1.2)
Total Protein: 7.3 g/dL (ref 6.0–8.3)

## 2023-03-30 LAB — LIPID PANEL
Cholesterol: 182 mg/dL (ref 0–200)
HDL: 43.4 mg/dL (ref >39.00)
LDL Cholesterol: 110 mg/dL — ABNORMAL HIGH (ref 0–99)
NonHDL: 139.04
Total CHOL/HDL Ratio: 4
Triglycerides: 147 mg/dL (ref 0.0–149.0)
VLDL: 29.4 mg/dL (ref 0.0–40.0)

## 2023-03-30 LAB — B12 AND FOLATE PANEL
Folate: >24.2 ng/mL (ref >5.9)
Vitamin B-12: 669 pg/mL (ref 211–911)

## 2023-03-30 LAB — LDL CHOLESTEROL, DIRECT: Direct LDL: 116 mg/dL

## 2023-03-30 LAB — HEMOGLOBIN A1C: Hgb A1c MFr Bld: 5.5 % (ref 4.6–6.5)

## 2023-03-30 NOTE — Assessment & Plan Note (Signed)
Taking Wegovy 1.7 mg dose having finished the  1.0 mg dose using  samples of the 0.25/0.5 pen .

## 2023-03-30 NOTE — Assessment & Plan Note (Signed)
Counselled that her exertional dyspnea is, due in part to deconditioning , encouraged to start exercising

## 2023-03-30 NOTE — Assessment & Plan Note (Addendum)
Slowly  Improving BMI with appetite suppression.  Encouraged to start exercising regularly and increase dose of wegovy  to 2.0 mg dose  Lab Results  Component Value Date   LIPASE 36.0 08/31/2022   Lab Results  Component Value Date   ALT 16 03/29/2023   AST 17 03/29/2023   ALKPHOS 61 03/29/2023   BILITOT 0.7 03/29/2023

## 2023-03-30 NOTE — Assessment & Plan Note (Signed)
Improving with PT and cessation of wearing lead apron

## 2023-03-30 NOTE — Assessment & Plan Note (Addendum)
10 yr risk is low  Based on prior labs.  Repeat labs reviewed; ;  advising trial of statin for primary prevention   Lab Results  Component Value Date   CHOL 182 03/29/2023   HDL 43.40 03/29/2023   LDLCALC 110 (H) 03/29/2023   LDLDIRECT 116.0 03/29/2023   TRIG 147.0 03/29/2023   CHOLHDL 4 03/29/2023

## 2023-04-05 DIAGNOSIS — M542 Cervicalgia: Secondary | ICD-10-CM | POA: Diagnosis not present

## 2023-04-11 ENCOUNTER — Other Ambulatory Visit: Payer: Self-pay

## 2023-04-15 ENCOUNTER — Other Ambulatory Visit: Payer: Self-pay | Admitting: Family Medicine

## 2023-04-15 DIAGNOSIS — J40 Bronchitis, not specified as acute or chronic: Secondary | ICD-10-CM

## 2023-04-15 MED FILL — Omeprazole Cap Delayed Release 40 MG: ORAL | 90 days supply | Qty: 90 | Fill #1 | Status: AC

## 2023-04-16 ENCOUNTER — Other Ambulatory Visit: Payer: Self-pay | Admitting: Family Medicine

## 2023-04-16 ENCOUNTER — Other Ambulatory Visit: Payer: Self-pay

## 2023-04-16 DIAGNOSIS — J40 Bronchitis, not specified as acute or chronic: Secondary | ICD-10-CM

## 2023-04-17 ENCOUNTER — Other Ambulatory Visit: Payer: Self-pay

## 2023-04-18 ENCOUNTER — Other Ambulatory Visit: Payer: Self-pay

## 2023-04-18 MED FILL — Albuterol Sulfate Inhal Aero 108 MCG/ACT (90MCG Base Equiv): RESPIRATORY_TRACT | 25 days supply | Qty: 6.7 | Fill #0 | Status: AC

## 2023-04-19 DIAGNOSIS — M542 Cervicalgia: Secondary | ICD-10-CM | POA: Diagnosis not present

## 2023-04-20 NOTE — Progress Notes (Signed)
[name] [address] [place] [address] Phone: [phone] Subjective:   Lisa [name], am serving as a scribe for Dr. Antoine Primas.  I'm seeing this patient by the request  of:  Sherlene Shams, MD  CC: Neck and back pain follow-up  [id number]  Lisa [name] is a 47 y.o. female coming in with complaint of back and neck pain. OMT on 02/28/2023. Patient states that she is having a flare today of L scapula and shoulder. Denies any numbness or tingling.   Medications patient has been prescribed:   Taking:         Reviewed prior external information including notes and imaging from previsou exam, outside providers and external EMR if available.   As well as notes that were available from care everywhere and other healthcare systems.  Past medical history, social, surgical and family history all reviewed in electronic medical record.  No pertanent information unless stated regarding to the chief complaint.   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   Family history of anesthesia complication    "morphine night terrors"   GERD (gastroesophageal reflux disease)    Headache(784.0)    "migraines"   Hypertension    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 and 2021   PONV (postoperative nausea and vomiting)    Pre-syncope 07/14/2014   Shortness of breath    "wheezing"   Weakness of left arm 03/03/2020    Allergies  Allergen Reactions   Chlorhexidine Gluconate     Developed pruritic rash on abdomen from prep for lap chole Mar 09 2022   Latex Hives     Rash and hives     Review of Systems:  No headache, visual changes, nausea, vomiting, diarrhea, constipation, dizziness, abdominal pain, skin rash, fevers, chills, night sweats, weight loss, swollen lymph nodes, body aches, joint swelling, chest pain, shortness of breath, mood changes. POSITIVE muscle aches  Objective  Blood pressure 110/70, height 5\' 4"  (1.626 m), weight 225 lb (102.1 kg).   General: No apparent distress alert and oriented x3 mood and affect normal, dressed appropriately.  HEENT: Pupils equal, extraocular movements intact  Respiratory: Patient's speak in full sentences and does not appear short of breath  Cardiovascular: No lower extremity edema, non tender, no erythema  Gait MSK:  Back does have some loss lordosis noted.  Some tenderness to palpation in the paraspinal musculature.  Neck exam does have some loss lordosis.  Mild positive Spurling's noted.  Multiple trigger points still noted in the left shoulder region.  Osteopathic findings  C2 flexed rotated and side bent right C7 flexed rotated and side bent left T3 extended rotated and side bent left inhaled rib T9 extended rotated and side bent left inhaled rib L2 flexed rotated and side bent right Sacrum right on right     Assessment and Plan:  Spondylosis, cervical, with myelopathy She has had some difficulty with her neck previously.  Will continue to monitor. Pain syndrome also noted today.  Continue to work on [employer] as well as we will perform  a physical therapy.  Discussed with patient about icing regimen and home exercises, increase activity slowly over the course of next several days.  Given meloxicam 15 mg daily as needed with 5-day burst to be discussed.  Follow-up again in 6 to 8 weeks.    Nonallopathic problems  Decision today to treat with OMT was based on Physical Exam  After verbal consent patient was treated with HVLA, ME, FPR techniques in cervical, rib, thoracic, lumbar,  and sacral  areas  Patient tolerated the procedure well with improvement in symptoms  Patient given exercises, stretches and lifestyle modifications  See medications in patient instructions if given  Patient will follow up in 4-8 weeks     The above documentation has been reviewed and is accurate and complete [name], DO         Note: This dictation was prepared with Dragon dictation along with smaller phrase technology. Any transcriptional errors that result from this process are unintentional.

## 2023-04-21 ENCOUNTER — Other Ambulatory Visit: Payer: Self-pay

## 2023-04-24 ENCOUNTER — Other Ambulatory Visit: Payer: Self-pay

## 2023-04-25 ENCOUNTER — Ambulatory Visit (INDEPENDENT_AMBULATORY_CARE_PROVIDER_SITE_OTHER): Payer: 59 | Admitting: Family Medicine

## 2023-04-25 ENCOUNTER — Encounter: Payer: Self-pay | Admitting: Family Medicine

## 2023-04-25 ENCOUNTER — Other Ambulatory Visit: Payer: Self-pay

## 2023-04-25 VITALS — BP 110/70 | Ht 64.0 in | Wt 225.0 lb

## 2023-04-25 DIAGNOSIS — M940 Chondrocostal junction syndrome [Tietze]: Secondary | ICD-10-CM | POA: Diagnosis not present

## 2023-04-25 DIAGNOSIS — M9903 Segmental and somatic dysfunction of lumbar region: Secondary | ICD-10-CM

## 2023-04-25 DIAGNOSIS — M4712 Other spondylosis with myelopathy, cervical region: Secondary | ICD-10-CM

## 2023-04-25 DIAGNOSIS — M9904 Segmental and somatic dysfunction of sacral region: Secondary | ICD-10-CM | POA: Diagnosis not present

## 2023-04-25 DIAGNOSIS — M9902 Segmental and somatic dysfunction of thoracic region: Secondary | ICD-10-CM

## 2023-04-25 DIAGNOSIS — M9901 Segmental and somatic dysfunction of cervical region: Secondary | ICD-10-CM | POA: Diagnosis not present

## 2023-04-25 DIAGNOSIS — M9908 Segmental and somatic dysfunction of rib cage: Secondary | ICD-10-CM | POA: Diagnosis not present

## 2023-04-25 MED ORDER — MELOXICAM 15 MG PO TABS
15.0000 mg | ORAL_TABLET | Freq: Every day | ORAL | 0 refills | Status: DC
  Filled 2023-04-25: qty 30, 30d supply, fill #0

## 2023-04-25 NOTE — Patient Instructions (Signed)
Meloxicam use in 5 day bursts See me in 7-8 weeks

## 2023-04-25 NOTE — Assessment & Plan Note (Signed)
She has had some difficulty with her neck previously.  Will continue to monitor. Pain syndrome also noted today.  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  Discussed with patient about icing regimen and home exercises, increase activity slowly over the course of next several days.  Given meloxicam 15 mg daily as needed with 5-day burst to be discussed.  Follow-up again in 6 to 8 weeks.

## 2023-04-26 DIAGNOSIS — M542 Cervicalgia: Secondary | ICD-10-CM | POA: Diagnosis not present

## 2023-04-27 ENCOUNTER — Encounter: Payer: Self-pay | Admitting: Family Medicine

## 2023-05-03 DIAGNOSIS — M542 Cervicalgia: Secondary | ICD-10-CM | POA: Diagnosis not present

## 2023-05-11 NOTE — Telephone Encounter (Signed)
Forwarding to [employer] as well as scheduling to review and advise.

## 2023-05-12 ENCOUNTER — Ambulatory Visit (INDEPENDENT_AMBULATORY_CARE_PROVIDER_SITE_OTHER): Payer: 59 | Admitting: Family Medicine

## 2023-05-12 ENCOUNTER — Encounter: Payer: Self-pay | Admitting: Family Medicine

## 2023-05-12 VITALS — BP 110/82 | HR 80 | Ht 64.0 in | Wt 223.0 lb

## 2023-05-12 DIAGNOSIS — M9901 Segmental and somatic dysfunction of cervical region: Secondary | ICD-10-CM

## 2023-05-12 DIAGNOSIS — M9902 Segmental and somatic dysfunction of thoracic region: Secondary | ICD-10-CM | POA: Diagnosis not present

## 2023-05-12 DIAGNOSIS — M9903 Segmental and somatic dysfunction of lumbar region: Secondary | ICD-10-CM

## 2023-05-12 DIAGNOSIS — M9908 Segmental and somatic dysfunction of rib cage: Secondary | ICD-10-CM | POA: Diagnosis not present

## 2023-05-12 DIAGNOSIS — M4712 Other spondylosis with myelopathy, cervical region: Secondary | ICD-10-CM

## 2023-05-12 DIAGNOSIS — M9904 Segmental and somatic dysfunction of sacral region: Secondary | ICD-10-CM | POA: Diagnosis not present

## 2023-05-12 DIAGNOSIS — M940 Chondrocostal junction syndrome [Tietze]: Secondary | ICD-10-CM

## 2023-05-12 MED ORDER — KETOROLAC TROMETHAMINE 60 MG/2ML IM SOLN
60.0000 mg | Freq: Once | INTRAMUSCULAR | Status: AC
  Administered 2023-05-12: 60 mg via INTRAMUSCULAR

## 2023-05-12 MED ORDER — METHYLPREDNISOLONE ACETATE 80 MG/ML IJ SUSP
80.0000 mg | Freq: Once | INTRAMUSCULAR | Status: AC
  Administered 2023-05-12: 80 mg via INTRAMUSCULAR

## 2023-05-12 NOTE — Patient Instructions (Signed)
Meloxicam for next 5 days starting tomorrow Injections in backside today See me again in 4-6 weeks

## 2023-05-12 NOTE — Telephone Encounter (Signed)
Scheduled

## 2023-05-12 NOTE — Assessment & Plan Note (Signed)
Significant stiffness and tightness noted.  Affecting daily activities.  Spondylosis as well as some myelopathy of the upper extremity that I think is contributing.  May need to consider the possibility of an injection.  Tried Toradol and Depo-Medrol today, attempted osteopathic manipulation today, discussed posture and ergonomics throughout the day.  For the next 24 hours.  Follow-up again in 4 to 6 weeks

## 2023-05-12 NOTE — Progress Notes (Signed)
[name] [address] [place] [address] Phone: [phone] Subjective:    I'm seeing this patient by the request  of:  Sherlene Shams, MD  CC: back and neck pain follow up   [id number]  Lisa [name] is a 47 y.o. female coming in with complaint of back and neck pain. OMT 04/25/2023. Patient states that she is having flare in L scapula, in the arm and L sided ribs. Pain began on Monday.            Reviewed prior external information including notes and imaging from previsou exam, outside providers and external EMR if available.   As well as notes that were available from care everywhere and other healthcare systems.  Past medical history, social, surgical and family history all reviewed in electronic medical record.  No pertanent information unless stated regarding to the chief complaint.   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 POSITIVE muscle aches  Objective  Blood pressure 110/82, pulse 80, height 5\' 4"  (1.626 m), weight 223 lb (101.2 kg), SpO2 98%.   General: No apparent distress alert and oriented x3 mood and affect normal, dressed appropriately.  HEENT: Pupils equal, extraocular movements intact  Respiratory: Patient's speak in full sentences and does not appear short of breath  Cardiovascular: No lower extremity edema, non tender, no erythema  Gait MSK:  Back   Osteopathic findings  C2 flexed rotated and side bent left C6 flexed rotated and side bent left T4 extended rotated and side bent left inhaled rib T9 extended rotated and side bent left L2 flexed rotated and side bent right Sacrum right on right       Assessment and Plan:  Slipped rib syndrome Significant stiffness and tightness noted.  Affecting daily activities.  Spondylosis as well as some myelopathy of the upper extremity that I think is contributing.  May need to consider the possibility of an injection.  Tried Toradol and Depo-Medrol today, attempted osteopathic manipulation today, discussed posture and ergonomics throughout the day.  For the next 24 hours.  Follow-up again in 4 to 6 weeks    Nonallopathic problems  Decision today to treat with OMT was based on Physical Exam  After verbal consent patient was treated with HVLA, ME, FPR techniques in cervical, rib, thoracic, lumbar, and sacral  areas  Patient tolerated the procedure well with improvement in symptoms  Patient given exercises, stretches and lifestyle modifications  See medications in patient instructions if given  Patient will follow up in 4-8 weeks    The above documentation has been reviewed and is accurate and complete [name], DO          Note: This dictation was  prepared with Dragon dictation along with smaller phrase technology. Any transcriptional errors that result from this process are unintentional.

## 2023-05-14 ENCOUNTER — Other Ambulatory Visit: Payer: Self-pay

## 2023-05-15 DIAGNOSIS — M542 Cervicalgia: Secondary | ICD-10-CM | POA: Diagnosis not present

## 2023-05-16 ENCOUNTER — Other Ambulatory Visit: Payer: Self-pay

## 2023-05-18 ENCOUNTER — Other Ambulatory Visit: Payer: Self-pay

## 2023-05-24 ENCOUNTER — Ambulatory Visit: Payer: 59 | Admitting: Family Medicine

## 2023-06-09 NOTE — Progress Notes (Unsigned)
[name] [address] [place] [address] Phone: [phone] Subjective:   [name], am serving as a scribe for Dr. Antoine Primas.  I'm seeing this patient by the request  of:  Sherlene Shams, MD  CC: Back and neck pain follow-up  [id number]  Lisa [name] is a 48 y.o. female coming in with complaint of back and neck pain. OMT on 05/12/2023.  Continues to go to physical therapy.  Last exam was have an exacerbation and given Toradol and Depo-Medrol injections as well as attempted osteopathic manipulation.  Patient states gradually got better after last visit. Still a little pain in left arm  Medications patient has been prescribed:   Taking:         Reviewed prior external information including notes and imaging from previsou exam, outside providers and external EMR if available.   As well as notes that were available from care everywhere and other healthcare systems.  Past medical history, social, surgical and family history all reviewed in electronic medical record.  No pertanent information unless stated regarding to the chief complaint.   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   Family history of anesthesia complication    "morphine night terrors"   GERD (gastroesophageal reflux disease)    Headache(784.0)    "migraines"   Hypertension    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 and 2021   PONV (postoperative nausea and vomiting)    Pre-syncope 07/14/2014   Shortness of breath    "wheezing"   Weakness of left arm 03/03/2020    Allergies   Allergen Reactions   Chlorhexidine Gluconate     Developed pruritic rash on abdomen from prep for lap chole Mar 09 2022   Latex Hives    Rash and hives     Review of Systems:  No headache, visual changes, nausea, vomiting, diarrhea, constipation, dizziness, abdominal pain, skin rash, fevers, chills, night sweats, weight loss, swollen lymph nodes, body aches, joint swelling, chest pain, shortness of breath, mood changes. POSITIVE muscle aches  Objective  Blood pressure 126/72, pulse 82, height 5\' 4"  (1.626 m), weight 220 lb (99.8 kg), SpO2 97%.   General: No apparent distress alert and oriented x3 mood and affect normal, dressed appropriately.  HEENT: Pupils equal, extraocular movements intact  Respiratory: Patient's speak in full sentences and does not appear short of breath  Cardiovascular: No lower extremity edema, non tender, no erythema  Gait MSK:  Back does have still tightness noted on the left side of neck.  Seems to be in the paraspinal musculature in the scapular region.  Negative Spurling's.  Back exam does have some loss of lordosis but very minimal.  Osteopathic findings  C2 flexed rotated and side bent right C6 flexed rotated and side bent left T3 extended rotated and side bent right inhaled rib T9 extended rotated and side bent left L4 flexed rotated and side bent right Sacrum right on right       Assessment and Plan:  Spondylosis, cervical, with myelopathy Patient has had difficulty with this previously.  Discussed icing regimen and home exercises.  Discussed potentially which activities to do and which ones to avoid.  Increase activity slowly.  Continue to work on posture and patient knows to watch ergonomics at work.  Follow-up with me again in 6 to 8 weeks otherwise.    Nonallopathic problems  Decision today to treat with OMT was based on Physical Exam  After verbal consent patient was treated with HVLA, ME, FPR techniques in cervical, rib, thoracic,  lumbar, and sacral  areas  Patient tolerated the procedure well with improvement in symptoms  Patient given exercises, stretches and lifestyle modifications  See medications in patient instructions if given  Patient will follow up in 4-8 weeks     The above documentation has been reviewed and is accurate and complete [name], DO         Note: This dictation was prepared with Dragon dictation along with smaller phrase technology. Any transcriptional errors that result from this process are unintentional.

## 2023-06-13 ENCOUNTER — Ambulatory Visit: Payer: Commercial Managed Care - PPO | Admitting: Family Medicine

## 2023-06-13 ENCOUNTER — Encounter: Payer: Self-pay | Admitting: Family Medicine

## 2023-06-13 VITALS — BP 126/72 | HR 82 | Ht 64.0 in | Wt 220.0 lb

## 2023-06-13 DIAGNOSIS — M9901 Segmental and somatic dysfunction of cervical region: Secondary | ICD-10-CM

## 2023-06-13 DIAGNOSIS — M9903 Segmental and somatic dysfunction of lumbar region: Secondary | ICD-10-CM | POA: Diagnosis not present

## 2023-06-13 DIAGNOSIS — M9908 Segmental and somatic dysfunction of rib cage: Secondary | ICD-10-CM

## 2023-06-13 DIAGNOSIS — M4712 Other spondylosis with myelopathy, cervical region: Secondary | ICD-10-CM

## 2023-06-13 DIAGNOSIS — M9902 Segmental and somatic dysfunction of thoracic region: Secondary | ICD-10-CM | POA: Diagnosis not present

## 2023-06-13 DIAGNOSIS — M9904 Segmental and somatic dysfunction of sacral region: Secondary | ICD-10-CM | POA: Diagnosis not present

## 2023-06-13 NOTE — Patient Instructions (Signed)
See you again in 7 weeks

## 2023-06-13 NOTE — Assessment & Plan Note (Signed)
Patient has had difficulty with this previously.  Discussed icing regimen and home exercises.  Discussed potentially which activities to do and which ones to avoid.  Increase activity slowly.  Continue to work on posture and patient knows to watch ergonomics at work.  Follow-up with me again in 6 to 8 weeks otherwise.

## 2023-06-14 DIAGNOSIS — M542 Cervicalgia: Secondary | ICD-10-CM | POA: Diagnosis not present

## 2023-06-15 ENCOUNTER — Other Ambulatory Visit: Payer: Self-pay | Admitting: Internal Medicine

## 2023-06-15 ENCOUNTER — Other Ambulatory Visit: Payer: Self-pay

## 2023-06-15 DIAGNOSIS — E119 Type 2 diabetes mellitus without complications: Secondary | ICD-10-CM

## 2023-06-16 ENCOUNTER — Other Ambulatory Visit: Payer: Self-pay

## 2023-06-16 MED FILL — Semaglutide Soln Pen-inj 2 MG/DOSE (8 MG/3ML): SUBCUTANEOUS | 28 days supply | Qty: 3 | Fill #0 | Status: AC

## 2023-06-16 MED FILL — Omeprazole Cap Delayed Release 40 MG: ORAL | 90 days supply | Qty: 90 | Fill #0 | Status: CN

## 2023-06-19 ENCOUNTER — Other Ambulatory Visit: Payer: Self-pay

## 2023-06-21 ENCOUNTER — Ambulatory Visit: Payer: 59 | Admitting: Family Medicine

## 2023-07-03 DIAGNOSIS — H52203 Unspecified astigmatism, bilateral: Secondary | ICD-10-CM | POA: Diagnosis not present

## 2023-07-03 DIAGNOSIS — E119 Type 2 diabetes mellitus without complications: Secondary | ICD-10-CM | POA: Diagnosis not present

## 2023-07-03 LAB — HM DIABETES EYE EXAM

## 2023-07-05 ENCOUNTER — Encounter: Payer: Self-pay | Admitting: Pulmonary Disease

## 2023-07-05 ENCOUNTER — Ambulatory Visit: Payer: Commercial Managed Care - PPO | Admitting: Pulmonary Disease

## 2023-07-05 VITALS — BP 118/80 | HR 85 | Temp 96.8°F | Ht 64.0 in | Wt 220.8 lb

## 2023-07-05 DIAGNOSIS — J386 Stenosis of larynx: Secondary | ICD-10-CM

## 2023-07-05 DIAGNOSIS — J383 Other diseases of vocal cords: Secondary | ICD-10-CM | POA: Diagnosis not present

## 2023-07-05 DIAGNOSIS — J4530 Mild persistent asthma, uncomplicated: Secondary | ICD-10-CM

## 2023-07-05 LAB — NITRIC OXIDE: Nitric Oxide: 7

## 2023-07-05 NOTE — Progress Notes (Signed)
Subjective:    Patient ID: Lisa [name], female    DOB: [birth year], 48 y.o.   MRN: [id number]  Patient Care Team: Sherlene Shams, MD as PCP - General (Internal Medicine) Salena Saner, MD as Consulting Physician (Pulmonary Disease)  Chief Complaint  Patient presents with   Follow-up    No SOB, wheezing or cough.     BACKGROUND/INTERVAL:Moani is a 48 year old lifelong never smoker with a prior history of subglottic stenosis status tracheal dilation in 2014 and redo dilation in 2021 by Dr. Christia Reading, resents for follow-up of mild persistent asthmatic bronchitis and vocal cord dysfunction.  HPI Discussed the use of AI scribe software for clinical note transcription with the patient, who gave verbal consent to proceed.  History of Present Illness   Lisa [name] is a 48 year old female with asthma/asthmatic bronchitis and subglottic stenosis who presents for follow-up care.  She describes her overall condition as 'pretty good' but occasionally forgets to take her Trelegy inhaler. She uses albuterol as a rescue inhaler, which is the only medication she has taken for her asthma.  She experiences throat clearing, which she attributes to inadequate hydration. She takes Mucinex to aid mucus clearance, especially since her airway was previously ballooned. She uses a harmonica intermittently, which she finds helps clear her airway and develop her vocal cords.  She recently returned from a vacation where she experienced a respiratory illness that did not test positive for any known infections. She was prescribed a Z-Pak and prednisone during this time.  She plans to travel again in March.  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   Review of Systems A 10 point review of systems was performed and it is as noted above otherwise negative.   Patient Active Problem List   Diagnosis Date Noted   Physical deconditioning  09/26/2022   Gastritis 09/26/2022   Transaminitis 08/31/2022   Motion sickness 08/04/2022   Chronic bilateral thoracic back pain 06/23/2022   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   Chronic bronchitis (HCC) 04/04/2022   GERD (gastroesophageal reflux disease) 04/04/2022   Complication of anesthesia 03/08/2022   Family history of colon cancer requiring screening colonoscopy 02/24/2022   Trigger point of left shoulder region 10/06/2021   Cervical radiculitis 03/03/2020   Weakness of left arm 03/03/2020   Cervicalgia 02/28/2020   Spondylosis, cervical, 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   Loud snoring 08/01/2019   B12 deficiency 08/01/2019   Hyperlipidemia associated with type 2 diabetes mellitus (HCC) 07/31/2019   Nonallopathic lesion of cervical region 04/29/2019   Nonallopathic lesion of thoracic region 04/29/2019   Nonallopathic lesion of rib cage 04/29/2019   Cervical radiculopathy at C7 03/26/2019   Allergic rhinitis due to pollen 08/18/2018   Type 2 diabetes mellitus without complications (HCC) 05/02/2017   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   Encounter for screening colonoscopy 04/29/2016   Low back pain without sciatica 04/28/2015   Morbid obesity (HCC) 04/28/2015    Social History   Tobacco Use   Smoking status: Never   Smokeless tobacco: Never  Substance Use Topics   Alcohol use: Yes    Alcohol/week: 2.0 standard drinks of alcohol    Types: 1 Glasses of wine, 1 Cans of beer per week    Comment: "social"    Allergies  Allergen Reactions   Chlorhexidine Gluconate     Developed pruritic rash on abdomen from prep for lap chole Mar 09 2022   Latex Hives  Rash and hives    Current Meds  Medication Sig   albuterol (VENTOLIN HFA) 108 (90 Base) MCG/ACT inhaler Inhale 2 puffs into the lungs every 6 (six) hours as needed for wheezing or shortness of breath.   cetirizine (ZYRTEC) 10 MG tablet Take 10 mg by mouth daily.   cholecalciferol (VITAMIN D3) 25 MCG (1000 UNIT) tablet Take  1,000 Units by mouth daily.   etonogestrel (NEXPLANON) 68 MG IMPL implant 1 each by Subdermal route once.   Fluticasone-Umeclidin-Vilant (TRELEGY ELLIPTA) 100-62.5-25 MCG/ACT AEPB Inhale 1 puff into the lungs daily.   folic acid (FOLVITE) 1 MG tablet Take 1 tablet (1 mg total) by mouth daily.   meloxicam (MOBIC) 15 MG tablet Take 1 tablet (15 mg total) by mouth daily.   omeprazole (PRILOSEC) 40 MG capsule Take 1 capsule (40 mg total) by mouth daily.   Semaglutide, 2 MG/DOSE, (OZEMPIC, 2 MG/DOSE,) 8 MG/3ML SOPN Inject 2 mg into the skin once a week as directed   Turmeric 400 MG CAPS Take 400 mg by mouth daily.     Immunization History  Administered Date(s) Administered   Influenza Split 02/25/2015, 01/30/2017   Influenza-Unspecified 02/09/2016, 01/21/2018, 02/12/2019, 03/13/2020, 02/10/2021, 02/10/2022, 02/07/2023   Moderna Sars-Covid-2 Vaccination 05/06/2019, 05/27/2019, 05/12/2020   PNEUMOCOCCAL CONJUGATE-20 01/07/2021   Tdap 04/27/2016        Objective:     BP 118/80 (BP Location: Right Arm, Cuff Size: Large)   Pulse 85   Temp (!) 96.8 F (36 C)   Ht 5\' 4"  (1.626 m)   Wt 220 lb 12.8 oz (100.2 kg)   SpO2 97%   BMI 37.90 kg/m   SpO2: 97 % O2 Device: None (Room air)  GENERAL: Obese woman, no acute distress, fully ambulatory. No conversational dyspnea. HEAD: Normocephalic, atraumatic.  EYES: Pupils equal, round, reactive to light. No scleral icterus.  MOUTH: Nose/mouth/throat not examined due to institutional masking requirements. NECK: Supple. No thyromegaly. Trachea midline. No JVD. No adenopathy. No stridor. PULMONARY: Good air entry bilaterally. No adventitious sounds. CARDIOVASCULAR: S1 and S2. Regular rate and rhythm.  ABDOMEN: Obese, otherwise benign. MUSCULOSKELETAL: No joint deformity, no clubbing, no edema.  NEUROLOGIC: No overt focal deficit, no gait disturbance, speech is fluent. SKIN: Intact,warm,dry. PSYCH: Mood and behavior normal.    Lab Results   Component Value Date   NITRICOXIDE 7 07/05/2023     Assessment & Plan:     ICD-10-CM   1. 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 Subglottic stenosis  J38.6     3. Vocal cord dysfunction  J38.3       Orders Placed This Encounter  Procedures   Nitric oxide   Discussion:    Asthma/Asthmatic Bronchitis Asthma is well-controlled. Occasionally forgets Trelegy but uses albuterol as a rescue inhaler. Reports throat clearing, possibly related to hydration. Plans to check airway inflammation with a nitric oxide test. If inflammation is controlled, consider deescalating Trelegy to an albuterol-budesonide mix. Discussed benefits of deescalation, including reduced medication burden and cost. Emphasized hydration's role in symptom management. - Check nitric oxide level in airway - Consider deescalating Trelegy to albuterol-budesonide mix if continues to do well on follow-up - Emphasize importance of hydration  Subglottic Stenosis Subglottic stenosis, previously treated with balloon dilation. Uses Mucinex and maintains hydration for mucus clearance. Uses a harmonica intermittently to aid mucus clearance and vocal cord function. Discussed benefits of these measures in maintaining airway patency and vocal cord function. - Continue using Mucinex -  Maintain adequate hydration - Continue using harmonica intermittently  Follow-up - Provide Z-Pak and prednisone if needed before vacation.      Advised if symptoms do not improve or worsen, to please contact office for sooner follow up or seek emergency care.    I spent 35 minutes of dedicated to the care of this patient on the date of this encounter to include pre-visit review of records, face-to-face time with the patient discussing conditions above, post visit ordering of testing, clinical documentation with the electronic health record, making appropriate referrals as documented, and communicating  necessary findings to members of the patients care team.     C. Danice Goltz, MD Advanced Bronchoscopy P[facility]    *This note was generated using voice recognition software/Dragon and/or AI transcription program.  Despite best efforts to proofread, errors can occur which can change the meaning. Any transcriptional errors that result from this process are unintentional and may not be fully corrected at the time of dictation.

## 2023-07-05 NOTE — Patient Instructions (Signed)
VISIT SUMMARY:  Lisa [name], it was great seeing you today for your follow-up appointment. We discussed your asthma and subglottic stenosis, and reviewed your current treatment plan and any necessary adjustments.  YOUR PLAN:  -ASTHMA: Asthma is a condition where your airways narrow and swell, which can make breathing difficult. Your asthma is well-controlled, but you occasionally forget to take your Trelegy inhaler. We checked the inflammation in your airway with a nitric oxide test, this was normal. Remember to stay hydrated as it helps manage your symptoms.  -SUBGLOTTIC STENOSIS: Subglottic stenosis is a narrowing of the airway below the vocal cords. You have been managing this well with Mucinex, staying hydrated, and using a harmonica intermittently to help clear your airway and improve vocal cord function. Continue these measures to maintain your airway and vocal cord health.  INSTRUCTIONS:  If you need a Z-Pak and prednisone before your upcoming vacation, please let us know.  Will see you in follow-up in 6 months time call sooner should any new problems arise.

## 2023-07-14 ENCOUNTER — Other Ambulatory Visit: Payer: Self-pay

## 2023-07-14 ENCOUNTER — Ambulatory Visit (INDEPENDENT_AMBULATORY_CARE_PROVIDER_SITE_OTHER): Payer: Commercial Managed Care - PPO | Admitting: Internal Medicine

## 2023-07-14 VITALS — BP 128/80 | HR 77 | Temp 98.1°F | Ht 64.0 in | Wt 218.2 lb

## 2023-07-14 DIAGNOSIS — R5381 Other malaise: Secondary | ICD-10-CM

## 2023-07-14 DIAGNOSIS — Z7984 Long term (current) use of oral hypoglycemic drugs: Secondary | ICD-10-CM

## 2023-07-14 DIAGNOSIS — N644 Mastodynia: Secondary | ICD-10-CM | POA: Diagnosis not present

## 2023-07-14 DIAGNOSIS — R7401 Elevation of levels of liver transaminase levels: Secondary | ICD-10-CM | POA: Diagnosis not present

## 2023-07-14 DIAGNOSIS — E538 Deficiency of other specified B group vitamins: Secondary | ICD-10-CM

## 2023-07-14 DIAGNOSIS — E1169 Type 2 diabetes mellitus with other specified complication: Secondary | ICD-10-CM | POA: Diagnosis not present

## 2023-07-14 DIAGNOSIS — Z0000 Encounter for general adult medical examination without abnormal findings: Secondary | ICD-10-CM

## 2023-07-14 DIAGNOSIS — J410 Simple chronic bronchitis: Secondary | ICD-10-CM

## 2023-07-14 LAB — HEMOGLOBIN A1C: Hgb A1c MFr Bld: 5.4 % (ref 4.6–6.5)

## 2023-07-14 LAB — MICROALBUMIN / CREATININE URINE RATIO
Creatinine,U: 182.1 mg/dL
Microalb Creat Ratio: 3.8 mg/g (ref 0.0–30.0)
Microalb, Ur: <0.7 mg/dL (ref 0.0–1.9)

## 2023-07-14 LAB — COMPREHENSIVE METABOLIC PANEL
ALT: 18 U/L (ref 0–35)
AST: 18 U/L (ref 0–37)
Albumin: 4.3 g/dL (ref 3.5–5.2)
Alkaline Phosphatase: 50 U/L (ref 39–117)
BUN: 13 mg/dL (ref 6–23)
CO2: 25 meq/L (ref 19–32)
Calcium: 9.1 mg/dL (ref 8.4–10.5)
Chloride: 107 meq/L (ref 96–112)
Creatinine, Ser: 0.69 mg/dL (ref 0.40–1.20)
GFR: 104 mL/min (ref >60.00)
Glucose, Bld: 92 mg/dL (ref 70–99)
Potassium: 4.1 meq/L (ref 3.5–5.1)
Sodium: 138 meq/L (ref 135–145)
Total Bilirubin: 0.7 mg/dL (ref 0.2–1.2)
Total Protein: 6.9 g/dL (ref 6.0–8.3)

## 2023-07-14 LAB — VITAMIN B12: Vitamin B-12: 594 pg/mL (ref 211–911)

## 2023-07-14 LAB — TSH: TSH: 1.33 u[IU]/mL (ref 0.35–5.50)

## 2023-07-14 MED ORDER — SCOPOLAMINE 1 MG/3DAYS TD PT72
1.0000 | MEDICATED_PATCH | TRANSDERMAL | 0 refills | Status: AC
  Filled 2023-07-14: qty 4, 12d supply, fill #0

## 2023-07-14 MED ORDER — TIRZEPATIDE 12.5 MG/0.5ML ~~LOC~~ SOAJ
12.5000 mg | SUBCUTANEOUS | 2 refills | Status: DC
  Filled 2023-07-14 (×3): qty 2, 28d supply, fill #0
  Filled 2023-08-02 – 2023-08-22 (×2): qty 2, 28d supply, fill #1
  Filled 2023-09-17: qty 2, 28d supply, fill #2
  Filled 2023-10-18: qty 2, 28d supply, fill #3
  Filled 2023-11-19: qty 2, 28d supply, fill #4
  Filled 2023-12-13: qty 2, 28d supply, fill #5
  Filled 2024-01-09: qty 2, 28d supply, fill #6

## 2023-07-14 MED ORDER — SPIRONOLACTONE 25 MG PO TABS
25.0000 mg | ORAL_TABLET | Freq: Every day | ORAL | 3 refills | Status: AC
  Filled 2023-07-14: qty 90, 90d supply, fill #0

## 2023-07-14 NOTE — Assessment & Plan Note (Signed)
 her exertional dyspnea is, improving with continued participation in exercising

## 2023-07-14 NOTE — Progress Notes (Signed)
 Patient ID: Lisa [name], female    DOB: 1976/02/10  Age: 48 y.o. MRN: [id number]  The patient is here for annual preventive examination and management of other chronic and acute problems.   The risk factors are reflected in the social history.   The roster of all physicians providing medical care to patient - is listed in the Snapshot section of the chart.   Activities of daily living:  The patient is 100% independent in all ADLs: dressing, toileting, feeding as well as independent mobility   Home safety : The patient has smoke detectors in the home. They wear seatbelts.  There are no unsecured firearms at home. There is no violence in the home.    There is no risks for hepatitis, STDs or HIV. There is no   history of blood transfusion. They have no travel history to infectious disease endemic areas of the world.   The patient has seen their dentist in the last six month. They have seen their eye doctor in the last year. The patinet  denies slight hearing difficulty with regard to whispered voices and some television programs.  They have deferred audiologic testing in the last year.  They do not  have excessive sun exposure. Discussed the need for sun protection: hats, long sleeves and use of sunscreen if there is significant sun exposure.    Diet: the importance of a healthy diet is discussed. They do have a healthy diet.   The benefits of regular aerobic exercise were discussed. The patient  exercises  3 to 5 days per week  for  60 minutes.    Depression screen: there are no signs or vegative symptoms of depression- irritability, change in appetite, anhedonia, sadness/tearfullness.   The following portions of the patient's history were reviewed and updated as appropriate: allergies, current medications, past family history, past medical history,  past surgical history, past social history  and problem list.   Visual acuity was not assessed per patient preference since the patient has  regular follow up with an  ophthalmologist. Hearing and body mass index were assessed and reviewed.    During the course of the visit the patient was educated and counseled about appropriate screening and preventive services including : fall prevention , diabetes screening, nutrition counseling, colorectal cancer screening, and recommended immunizations.    Chief Complaint:  Cyclical breast pain occurring monthly    Review of Symptoms  Patient denies headache, fevers, malaise, unintentional weight loss, skin rash, eye pain, sinus congestion and sinus pain, sore throat, dysphagia,  hemoptysis , cough, dyspnea, wheezing, chest pain, palpitations, orthopnea, edema, abdominal pain, nausea, melena, diarrhea, constipation, flank pain, dysuria, hematuria, urinary  Frequency, nocturia, numbness, tingling, seizures,  Focal weakness, Loss of consciousness,  Tremor, insomnia, depression, anxiety, and suicidal ideation.    Physical Exam:  BP 128/80   Pulse 77   Temp 98.1 F (36.7 C) (Oral)   Ht 5\' 4"  (1.626 m)   Wt 218 lb 3.2 oz (99 kg)   SpO2 99%   BMI 37.45 kg/m    Physical Exam Vitals reviewed.  Constitutional:      General: She is not in acute distress.    Appearance: Normal appearance. She is well-developed and normal weight. She is not ill-appearing, toxic-appearing or diaphoretic.  HENT:     Head: Normocephalic.     Right Ear: Tympanic membrane, ear canal and external ear normal. There is no impacted cerumen.     Left Ear: Tympanic membrane, ear canal  and external ear normal. There is no impacted cerumen.     Nose: Nose normal.     Mouth/Throat:     Mouth: Mucous membranes are moist.     Pharynx: Oropharynx is clear.  Eyes:     General: No scleral icterus.       Right eye: No discharge.        Left eye: No discharge.     Conjunctiva/sclera: Conjunctivae normal.     Pupils: Pupils are equal, round, and reactive to light.  Neck:     Thyroid: No thyromegaly.     Vascular: No  carotid bruit or JVD.  Cardiovascular:     Rate and Rhythm: Normal rate and regular rhythm.     Heart sounds: Normal heart sounds.  Pulmonary:     Effort: Pulmonary effort is normal. No respiratory distress.     Breath sounds: Normal breath sounds.  Chest:  Breasts:    Breasts are symmetrical.     Right: Normal. No swelling, inverted nipple, mass, nipple discharge, skin change or tenderness.     Left: Normal. No swelling, inverted nipple, mass, nipple discharge, skin change or tenderness.  Abdominal:     General: Bowel sounds are normal.     Palpations: Abdomen is soft. There is no mass.     Tenderness: There is no abdominal tenderness. There is no guarding or rebound.  Musculoskeletal:        General: Normal range of motion.     Cervical back: Normal range of motion and neck supple.  Lymphadenopathy:     Cervical: No cervical adenopathy.     Upper Body:     Right upper body: No supraclavicular, axillary or pectoral adenopathy.     Left upper body: No supraclavicular, axillary or pectoral adenopathy.  Skin:    General: Skin is warm and dry.  Neurological:     General: No focal deficit present.     Mental Status: She is alert and oriented to person, place, and time. Mental status is at baseline.  Psychiatric:        Mood and Affect: Mood normal.        Behavior: Behavior normal.        Thought Content: Thought content normal.        Judgment: Judgment normal.    Assessment and Plan: Type 2 diabetes mellitus with morbid obesity (HCC) Assessment & Plan: She has lost 62 lbs since 2020 .  She is currently Taking OEMPIC 2.0 MG DOSE .  And has reached  plateau despite exercising . Change to mounajro 12.5 mg startign dose. Foot exam normal    Lab Results  Component Value Date   HGBA1C 5.5 03/29/2023   Lab Results  Component Value Date   MICROALBUR <0.7 06/23/2022   MICROALBUR <0.7 06/17/2021       Orders: -     Microalbumin / creatinine urine ratio -     Comprehensive  metabolic panel -     Hemoglobin A1c -     Lipid Panel w/reflex Direct LDL -     TSH  Physical deconditioning Assessment & Plan:  her exertional dyspnea is, improving with continued participation in exercising    B12 deficiency Assessment & Plan: Managedn now with daily oral supplements  Orders: -     Vitamin B12  Transaminitis Assessment & Plan: Resolved. All serologies were  negative for viral and autoimmune etiologies.Marland Kitchen MRCP was done to rule out retained stone from prior cholecystectomy  Lab Results  Component Value Date   ALT 18 07/14/2023   AST 18 07/14/2023   ALKPHOS 50 07/14/2023   BILITOT 0.7 07/14/2023      Morbid obesity (HCC) Assessment & Plan: Slowly  Improving BMI with appetite suppression.  62 lb weight loss thus far.  She is now  exercising regularly but weight ha s not change in the last several week .  Will increase dose of Mounjaro to 12 .5  mg weekly   Lab Results  Component Value Date   LIPASE 36.0 08/31/2022   Lab Results  Component Value Date   ALT 18 07/14/2023   AST 18 07/14/2023   ALKPHOS 50 07/14/2023   BILITOT 0.7 07/14/2023      Simple chronic bronchitis (HCC) Assessment & Plan: Managed by Pulmonology  with Trelegy .  She is currently asymptomatic    Breast tenderness in female Assessment & Plan: Left breast  diagnsotic mammo and [place] in October 2024 were  normal.  Trial of spironolactone prn   Other orders -     Tirzepatide; Inject 12.5 mg into the skin once a week.  Dispense: 6 mL; Refill: 2 -     Spironolactone; Take 1 tablet (25 mg total) by mouth daily.  Dispense: 90 tablet; Refill: 3 -     Scopolamine; Place 1 patch (1.5 mg total) onto the skin every 3 (three) days.  Dispense: 4 patch; Refill: 0    Return in about 6 months (around 01/11/2024) for follow up diabetes.  Sherlene Shams, MD

## 2023-07-14 NOTE — Patient Instructions (Signed)
 Sending mounjaro 12.5 mg weekly injection INSTEAD OF OZEMPIC ( IT IT'S AFFORDABLE) BECAUSE YOU HAVE MAXED OUT OZEMPIC   ALDACTONE (SPIRONOLACTONE) FOR FLUID RETENTION THAT MAY BE CAUSING YOUR BREAST PAIN.  USE AS NEEDED  FOR 1-2 WEEKS PER MONTH  YOU CAN INCREASE THE DOSE TO 50 MG IF NEEDED BUT IT MAY LOWER YOUR BLOOD PRESSURE

## 2023-07-14 NOTE — Assessment & Plan Note (Addendum)
 She has lost 62 lbs since 2020 .  She is currently Taking OEMPIC 2.0 MG DOSE .  And has reached  plateau despite exercising . Change to mounajro 12.5 mg startign dose. Foot exam normal    Lab Results  Component Value Date   HGBA1C 5.5 03/29/2023   Lab Results  Component Value Date   MICROALBUR <0.7 06/23/2022   MICROALBUR <0.7 06/17/2021

## 2023-07-14 NOTE — Assessment & Plan Note (Signed)
 Managedn now with daily oral supplements

## 2023-07-15 LAB — LIPID PANEL W/REFLEX DIRECT LDL
Cholesterol: 184 mg/dL (ref <200)
HDL: 48 mg/dL — ABNORMAL LOW (ref >=50)
LDL Cholesterol (Calc): 112 mg/dL — ABNORMAL HIGH
Non-HDL Cholesterol (Calc): 136 mg/dL — ABNORMAL HIGH (ref <130)
Total CHOL/HDL Ratio: 3.8 (calc) (ref <5.0)
Triglycerides: 126 mg/dL (ref <150)

## 2023-07-16 ENCOUNTER — Encounter: Payer: Self-pay | Admitting: Internal Medicine

## 2023-07-16 DIAGNOSIS — Z0000 Encounter for general adult medical examination without abnormal findings: Secondary | ICD-10-CM | POA: Insufficient documentation

## 2023-07-16 NOTE — Assessment & Plan Note (Signed)
 Resolved. All serologies were  negative for viral and autoimmune etiologies.Marland Kitchen MRCP was done to rule out retained stone from prior cholecystectomy   Lab Results  Component Value Date   ALT 18 07/14/2023   AST 18 07/14/2023   ALKPHOS 50 07/14/2023   BILITOT 0.7 07/14/2023

## 2023-07-16 NOTE — Assessment & Plan Note (Addendum)
 Left breast  diagnsotic mammo and [place] in October 2024 were  normal.  Trial of spironolactone prn

## 2023-07-16 NOTE — Assessment & Plan Note (Signed)

## 2023-07-16 NOTE — Assessment & Plan Note (Addendum)
 Slowly  Improving BMI with appetite suppression.  62 lb weight loss thus far.  She is now  exercising regularly but weight ha s not change in the last several week .  Will increase dose of Mounjaro to 12 .5  mg weekly   Lab Results  Component Value Date   LIPASE 36.0 08/31/2022   Lab Results  Component Value Date   ALT 18 07/14/2023   AST 18 07/14/2023   ALKPHOS 50 07/14/2023   BILITOT 0.7 07/14/2023

## 2023-07-16 NOTE — Assessment & Plan Note (Signed)
 Managed by Pulmonology  with Trelegy .  She is currently asymptomatic

## 2023-07-20 ENCOUNTER — Other Ambulatory Visit: Payer: Self-pay

## 2023-07-20 MED FILL — Omeprazole Cap Delayed Release 40 MG: ORAL | 90 days supply | Qty: 90 | Fill #0 | Status: AC

## 2023-07-30 ENCOUNTER — Encounter: Payer: Self-pay | Admitting: Internal Medicine

## 2023-08-02 ENCOUNTER — Encounter: Payer: Self-pay | Admitting: Internal Medicine

## 2023-08-02 ENCOUNTER — Other Ambulatory Visit: Payer: Self-pay

## 2023-08-02 MED ORDER — FLUTICASONE PROPIONATE 50 MCG/ACT NA SUSP
2.0000 | Freq: Every day | NASAL | 2 refills | Status: DC
  Filled 2023-08-02 – 2023-08-22 (×2): qty 16, 30d supply, fill #0
  Filled 2023-09-17: qty 16, 30d supply, fill #1
  Filled 2024-02-08: qty 16, 30d supply, fill #2

## 2023-08-02 NOTE — Progress Notes (Unsigned)
 [name] [address] [place] [address] Phone: [phone] Subjective:   Lisa [name], am serving as a scribe for Dr. Antoine [name].  I'm seeing this patient by the request  of:  Lisa Shams, MD  CC: back and neck pain follow up   [id number]  Lisa [name] is a 48 y.o. female coming in with complaint of back and neck pain. OMT on 06/10/2023. Patient states same per usual. No new big changes or new symptoms.          Reviewed prior external information including notes and imaging from previsou exam, outside providers and external EMR if available.   As well as notes that were available from care everywhere and other healthcare systems.  Past medical history, social, surgical and family history all reviewed in electronic medical record.  No pertanent information unless stated regarding to the chief complaint.   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 POSITIVE muscle aches  Objective  Blood pressure 102/60, pulse 83, height 5\' 4"  (1.626 m), SpO2 97%.   General: No apparent distress alert and oriented x3 mood and affect normal, dressed appropriately.  HEENT: Pupils equal, extraocular movements intact  Respiratory: Patient's speak in full sentences and does not appear short of breath  Cardiovascular: No lower extremity edema, non tender, no erythema  MSK:  Back does have some more tightness noted in the low back.  Still seems to be the parascapular area where the elevated tightness is and it seems to be more right greater than left.  Neck exam does have some limited sidebending bilaterally.  Osteopathic findings  C2 flexed rotated and side bent right C7 flexed rotated and side bent left T3 extended rotated and side bent right inhaled rib T7 extended rotated and side bent left L3 flexed rotated and side bent right L4 flexed rotated and side bent left Sacrum right on right     Assessment and Plan:  Slipped rib syndrome Chronic issues noted.  Discussed icing regimen and home exercises, which activities to do and which ones to avoid.  Discussed tightness with Lisa [name] right greater than left.  We will continue to monitor closely.  Patient is going to continue to  work on weight loss.  Follow-up again in 2 months    Nonallopathic problems  Decision today to treat with OMT was based on Physical Exam  After verbal consent patient was treated with HVLA, ME, FPR techniques in cervical, rib, thoracic, lumbar, and sacral  areas  Patient tolerated the procedure well with improvement in symptoms  Patient given exercises, stretches and lifestyle modifications  See medications in patient instructions if given  Patient will follow up in 8  weeks    The above documentation has been reviewed and is accurate and complete Lisa Saa, DO          Note: This dictation was prepared with Dragon dictation along with smaller phrase technology. Any transcriptional errors that result from this process are unintentional.

## 2023-08-02 NOTE — Telephone Encounter (Signed)
 Pt requesting Flonase. Not on current med list. Medication has been pended

## 2023-08-03 ENCOUNTER — Ambulatory Visit: Payer: Commercial Managed Care - PPO | Admitting: Family Medicine

## 2023-08-03 ENCOUNTER — Ambulatory Visit (INDEPENDENT_AMBULATORY_CARE_PROVIDER_SITE_OTHER)

## 2023-08-03 ENCOUNTER — Encounter: Payer: Self-pay | Admitting: Family Medicine

## 2023-08-03 VITALS — BP 102/60 | HR 83 | Ht 64.0 in

## 2023-08-03 DIAGNOSIS — M9903 Segmental and somatic dysfunction of lumbar region: Secondary | ICD-10-CM

## 2023-08-03 DIAGNOSIS — M9904 Segmental and somatic dysfunction of sacral region: Secondary | ICD-10-CM | POA: Diagnosis not present

## 2023-08-03 DIAGNOSIS — M9901 Segmental and somatic dysfunction of cervical region: Secondary | ICD-10-CM

## 2023-08-03 DIAGNOSIS — M9902 Segmental and somatic dysfunction of thoracic region: Secondary | ICD-10-CM

## 2023-08-03 DIAGNOSIS — M25552 Pain in left hip: Secondary | ICD-10-CM

## 2023-08-03 DIAGNOSIS — M940 Chondrocostal junction syndrome [Tietze]: Secondary | ICD-10-CM

## 2023-08-03 DIAGNOSIS — M9908 Segmental and somatic dysfunction of rib cage: Secondary | ICD-10-CM

## 2023-08-03 NOTE — Patient Instructions (Signed)
 Good to see you! Have fun on cruise you deserve it See you again after the cruise

## 2023-08-03 NOTE — Assessment & Plan Note (Addendum)
 Chronic issues noted.  Discussed icing regimen and home exercises, which activities to do and which ones to avoid.  Discussed tightness with [name] right greater than left.  We will continue to monitor closely.  Patient is going to continue to work on weight loss.  Follow-up again in 2 months

## 2023-08-07 ENCOUNTER — Other Ambulatory Visit: Payer: Self-pay

## 2023-08-09 ENCOUNTER — Encounter: Payer: Self-pay | Admitting: Internal Medicine

## 2023-08-17 ENCOUNTER — Other Ambulatory Visit: Payer: Self-pay

## 2023-08-22 ENCOUNTER — Other Ambulatory Visit: Payer: Self-pay

## 2023-08-23 ENCOUNTER — Other Ambulatory Visit: Payer: Self-pay

## 2023-09-08 ENCOUNTER — Encounter: Payer: Self-pay | Admitting: Family Medicine

## 2023-09-14 NOTE — Progress Notes (Signed)
 [facility] [address] [place] [address] Phone: [phone] Subjective:   Lisa [name], am serving as a scribe for Dr. Ronnell Coins.  I'm seeing this patient by the request  of:  Thersia Flax, MD  CC: Back and neck pain follow-up  [id number]  DELPHINE CRYDER is a 48 y.o. female coming in with complaint of back and neck pain. OMT on 08/03/2023. Patient states that she had a flare up last week between scapula. Also had some pain down the L arm into the fingers but this has subsided.           Reviewed prior external information including notes and imaging from previsou exam, outside providers and external EMR if available.   As well as notes that were available from care everywhere and other healthcare systems.  Past medical history, social, surgical and family history all reviewed in electronic medical record.  No pertanent information unless stated regarding to the chief complaint.   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 POSITIVE muscle aches  Objective  Blood pressure 110/72, height 5\' 4"  (1.626 m).   General: No apparent distress alert and oriented x3 mood and affect normal, dressed appropriately.  HEENT: Pupils equal, extraocular movements intact  Respiratory: Patient's speak in full sentences and does not appear short of breath  Cardiovascular: No lower extremity edema, non tender, no erythema  Gait MSK:  Back does have some loss lordosis noted.  Some tightness noted in the neck noted today.  Fairly severe overall.  Negative Spurling's but tightness noted in the left parascapular area.  Osteopathic findings  C2 flexed rotated and side bent right C6 flexed rotated and side bent left T3 extended rotated and side bent left inhaled rib T9 extended rotated and side bent left inhaled rib L2 flexed rotated and side bent right Sacrum right on right       Assessment and Plan:  Cervical radiculopathy at C7 Has intermittent radicular symptoms still.  Discussed with patient icing regimen and home exercises, discussed which activities to do and which ones to avoid.  Increase activity slowly.  Discussed icing regimen.  Follow-up again in    Nonallopathic problems  Decision today to  treat with OMT was based on Physical Exam  After verbal consent patient was treated with HVLA, ME, FPR techniques in cervical, rib, thoracic, lumbar, and sacral  areas  Patient tolerated the procedure well with improvement in symptoms  Patient given exercises, stretches and lifestyle modifications  See medications in patient instructions if given  Patient will follow up in 4-8 weeks     The above documentation has been reviewed and is accurate  and complete Wane Mollett M Ladarrious Kirksey, DO         Note: This dictation was prepared with Dragon dictation along with smaller phrase technology. Any transcriptional errors that result from this process are unintentional.

## 2023-09-15 ENCOUNTER — Encounter: Payer: Self-pay | Admitting: Family Medicine

## 2023-09-15 ENCOUNTER — Ambulatory Visit (INDEPENDENT_AMBULATORY_CARE_PROVIDER_SITE_OTHER): Admitting: Family Medicine

## 2023-09-15 VITALS — BP 110/72 | Ht 64.0 in

## 2023-09-15 DIAGNOSIS — M9903 Segmental and somatic dysfunction of lumbar region: Secondary | ICD-10-CM | POA: Diagnosis not present

## 2023-09-15 DIAGNOSIS — M5412 Radiculopathy, cervical region: Secondary | ICD-10-CM | POA: Diagnosis not present

## 2023-09-15 DIAGNOSIS — M9902 Segmental and somatic dysfunction of thoracic region: Secondary | ICD-10-CM | POA: Diagnosis not present

## 2023-09-15 DIAGNOSIS — M9901 Segmental and somatic dysfunction of cervical region: Secondary | ICD-10-CM

## 2023-09-15 DIAGNOSIS — M9908 Segmental and somatic dysfunction of rib cage: Secondary | ICD-10-CM

## 2023-09-15 DIAGNOSIS — M9904 Segmental and somatic dysfunction of sacral region: Secondary | ICD-10-CM

## 2023-09-15 NOTE — Patient Instructions (Signed)
 Do prescribed exercises at least 3x a week Good to see you! Walk like a Geisha See you again in 2 months

## 2023-09-15 NOTE — Assessment & Plan Note (Signed)
 Has intermittent radicular symptoms still.  Discussed with patient icing regimen and home exercises, discussed which activities to do and which ones to avoid.  Increase activity slowly.  Discussed icing regimen.  Follow-up again in

## 2023-09-17 ENCOUNTER — Other Ambulatory Visit: Payer: Self-pay | Admitting: Pulmonary Disease

## 2023-09-18 ENCOUNTER — Other Ambulatory Visit: Payer: Self-pay

## 2023-09-18 MED ORDER — TRELEGY ELLIPTA 100-62.5-25 MCG/ACT IN AEPB
1.0000 | INHALATION_SPRAY | Freq: Every day | RESPIRATORY_TRACT | 11 refills | Status: AC
  Filled 2023-09-18: qty 60, 30d supply, fill #0
  Filled 2023-10-18: qty 60, 30d supply, fill #1
  Filled 2023-12-13: qty 60, 30d supply, fill #2
  Filled 2024-01-09: qty 60, 30d supply, fill #3
  Filled 2024-02-08: qty 60, 30d supply, fill #4
  Filled 2024-03-07: qty 60, 30d supply, fill #5
  Filled 2024-04-10: qty 60, 30d supply, fill #6
  Filled 2024-05-12: qty 60, 30d supply, fill #7
  Filled 2024-06-12: qty 60, 30d supply, fill #8

## 2023-09-20 ENCOUNTER — Other Ambulatory Visit: Payer: Self-pay

## 2023-09-25 ENCOUNTER — Other Ambulatory Visit: Payer: Self-pay

## 2023-10-16 ENCOUNTER — Other Ambulatory Visit (HOSPITAL_COMMUNITY): Payer: Self-pay

## 2023-10-16 ENCOUNTER — Telehealth: Payer: Self-pay

## 2023-10-16 NOTE — Telephone Encounter (Signed)
 Pharmacy Patient Advocate Encounter   Received notification from CoverMyMeds that prior authorization for Ozempic  4 is required/requested.   Insurance verification completed.   The patient is insured through [facility] .   Per test claim: The current 28 day co-pay is, $24.99.  No PA needed at this time. This test claim was processed through [facility]- copay amounts may vary at other pharmacies due to pharmacy/plan contracts, or as the patient moves through the different stages of their insurance plan.

## 2023-10-18 MED FILL — Omeprazole Cap Delayed Release 40 MG: ORAL | 90 days supply | Qty: 90 | Fill #1 | Status: AC

## 2023-10-25 ENCOUNTER — Other Ambulatory Visit: Payer: Self-pay

## 2023-10-25 ENCOUNTER — Telehealth: Admitting: Physician Assistant

## 2023-10-25 DIAGNOSIS — R3989 Other symptoms and signs involving the genitourinary system: Secondary | ICD-10-CM

## 2023-10-25 MED ORDER — CEPHALEXIN 500 MG PO CAPS
500.0000 mg | ORAL_CAPSULE | Freq: Two times a day (BID) | ORAL | 0 refills | Status: DC
  Filled 2023-10-25: qty 14, 7d supply, fill #0

## 2023-10-25 NOTE — Progress Notes (Signed)

## 2023-10-30 ENCOUNTER — Ambulatory Visit
Admission: EM | Admit: 2023-10-30 | Discharge: 2023-10-30 | Disposition: A | Discharge status: Home / Self Care | Attending: Family Medicine | Admitting: Family Medicine

## 2023-10-30 ENCOUNTER — Other Ambulatory Visit: Payer: Self-pay

## 2023-10-30 ENCOUNTER — Encounter: Payer: Self-pay | Admitting: *Deleted

## 2023-10-30 ENCOUNTER — Ambulatory Visit: Payer: Self-pay

## 2023-10-30 DIAGNOSIS — R300 Dysuria: Secondary | ICD-10-CM | POA: Insufficient documentation

## 2023-10-30 DIAGNOSIS — R5381 Other malaise: Secondary | ICD-10-CM | POA: Insufficient documentation

## 2023-10-30 LAB — POCT URINALYSIS DIP (MANUAL ENTRY)
Bilirubin, UA: NEGATIVE
Glucose, UA: NEGATIVE mg/dL
Ketones, POC UA: NEGATIVE mg/dL
Leukocytes, UA: NEGATIVE
Nitrite, UA: NEGATIVE
Protein Ur, POC: NEGATIVE mg/dL
Spec Grav, UA: 1.01 (ref 1.010–1.025)
Urobilinogen, UA: 0.2 U/dL
pH, UA: 6 (ref 5.0–8.0)

## 2023-10-30 LAB — POCT URINE PREGNANCY: Preg Test, Ur: NEGATIVE

## 2023-10-30 MED ORDER — CEPHALEXIN 500 MG PO CAPS
500.0000 mg | ORAL_CAPSULE | Freq: Four times a day (QID) | ORAL | 0 refills | Status: AC
  Filled 2023-10-30: qty 12, 3d supply, fill #0

## 2023-10-30 MED ORDER — FLUCONAZOLE 150 MG PO TABS
150.0000 mg | ORAL_TABLET | Freq: Every day | ORAL | 0 refills | Status: DC
  Filled 2023-10-30: qty 2, 2d supply, fill #0

## 2023-10-30 NOTE — ED Provider Notes (Signed)
 Lisa [name]    CSN: [id number] Arrival date & time: 10/30/23  1557      History   Chief Complaint Chief Complaint  Patient presents with   Abdominal Pain    HPI Lisa [name] is a 48 y.o. female presents for urinary symptoms.  Patient did an e-visit on June 11 for 1 day of UTI symptoms.  She was started on Keflex  twice daily for 7 days.  She reports the majority of her urinary symptoms have resolved still has some lower abdominal discomfort but denies any burning, urgency, frequency, hematuria.  Does state over the past day or so she has been having chills and malaise and just overall does not feel well.  No flank pain, nausea/vomiting, or documented fevers.  She is concerned she may have a kidney infection.  She has been tolerating the antibiotic well.  No other concerns at this time.   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   PONV (postoperative nausea and vomiting)    Pre-syncope 07/14/2014   Shortness of breath    wheezing   Weakness of left arm 03/03/2020    Patient Active Problem List   Diagnosis Date Noted   Encounter for preventive health examination 07/16/2023   Physical deconditioning 09/26/2022   Gastritis 09/26/2022   Transaminitis 08/31/2022   Motion sickness 08/04/2022   Chronic bilateral thoracic back  pain 06/23/2022   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   Chronic bronchitis (HCC) 04/04/2022   GERD (gastroesophageal reflux disease) 04/04/2022   Complication of anesthesia 03/08/2022   Family history of colon cancer requiring screening colonoscopy 02/24/2022   Trigger point of left shoulder region 10/06/2021   Weakness of left arm 03/03/2020   Spondylosis, cervical, 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   Loud snoring 08/01/2019   B12 deficiency 08/01/2019   Hyperlipidemia associated with type 2 diabetes mellitus (HCC) 07/31/2019   Nonallopathic lesion of cervical region 04/29/2019   Nonallopathic lesion of thoracic region 04/29/2019   Nonallopathic lesion of rib cage 04/29/2019   Cervical radiculopathy at C7 03/26/2019   Breast tenderness in female 01/24/2019   Allergic rhinitis due to pollen 08/18/2018   Type 2 diabetes mellitus with morbid obesity (HCC) 05/02/2017   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   Encounter for screening colonoscopy 04/29/2016   Low back pain without sciatica 04/28/2015   Morbid obesity (HCC) 04/28/2015    Past Surgical History:  Procedure Laterality Date   CESAREAN SECTION     x 2   CHOLECYSTECTOMY  03/09/2022   COLONOSCOPY WITH PROPOFOL  N/A 07/11/2022   Procedure: COLONOSCOPY WITH PROPOFOL ;  Surgeon: Luke Salaam, MD;  Location: [facility] ENDOSCOPY;  Service: Gastroenterology;  Laterality: N/A;   MICROLARYNGOSCOPY WITH DILATION N/A 03/13/2020   Procedure: MICROLARYNGOSCOPY WITH DILATION w/JET VENT MITOMYCIN  C;  Surgeon: Virgina Grills, MD;  Location: [facility] OR;  Service: ENT;  Laterality: N/A;   MICROLARYNGOSCOPY WITH LASER AND BALLOON DILATION N/A 07/20/2012   Procedure: MICROLARYNGOSCOPY WITH LASER AND BALLOON DILATION;  Surgeon: Virgina Grills, MD;  Location: [facility] OR;  Service: ENT;  Laterality: N/A;  WITH JET VENTURI VENTILATION   TONGUE FLAP RELEASE     widom extractions      OB History   No obstetric history on file.      Home Medications    Prior to Admission  medications   Medication Sig Start Date End Date Taking? Authorizing Provider  cephALEXin  (KEFLEX ) 500 MG capsule Take 1 capsule (500 mg total) by mouth 4 (four) times daily for 3 days. 10/30/23 11/02/23 Yes Izzah Pasqua, Jodi R, NP  fluconazole  (DIFLUCAN ) 150 MG tablet Take 1 tablet (150 mg total) by mouth daily. Take 1 tablet today and you may repeat in 3 days if needed 10/30/23  Yes Zebulen Simonis, Jodi R, NP  albuterol  (VENTOLIN  HFA) 108 (90 Base) MCG/ACT inhaler Inhale 2 puffs into the lungs every 6 (six) hours as needed for wheezing or shortness of breath. 04/18/23   Tullo, Teresa L, MD  cetirizine (ZYRTEC) 10 MG tablet Take 10 mg by mouth daily.    [provider]  cholecalciferol (VITAMIN D3) 25 MCG (1000 UNIT) tablet Take 1,000 Units by mouth daily.    [provider]  etonogestrel  (NEXPLANON ) 68 MG IMPL implant 1 each by Subdermal route once.    [provider]  fluticasone  (FLONASE ) 50 MCG/ACT nasal spray Place 2 sprays into both nostrils daily. 08/02/23   Thersia Flax, MD  Fluticasone -Umeclidin-Vilant (TRELEGY ELLIPTA ) 100-62.5-25 MCG/ACT AEPB Inhale 1 puff into the lungs daily. 09/18/23   Marc Senior, MD  folic acid  (FOLVITE ) 1 MG tablet Take 1 tablet (1 mg total) by mouth daily. 09/23/21   Thersia Flax, MD  Magnesium 100 MG TABS Take 1 tablet by mouth daily.    [provider]  omeprazole  (PRILOSEC) 40 MG capsule Take 1 capsule (40 mg total) by mouth daily. 06/16/23 06/15/24  Thersia Flax, MD  scopolamine  (TRANSDERM-SCOP) 1 MG/3DAYS Place 1 patch (1.5 mg total) onto the skin every 3 (three) days. 07/14/23   Thersia Flax, MD  spironolactone  (ALDACTONE ) 25 MG tablet Take 1 tablet (25 mg total) by mouth daily. 07/14/23   Thersia Flax, MD  tirzepatide  (MOUNJARO ) 12.5 MG/0.5ML Pen Inject 12.5 mg into the skin once a week. 07/14/23   Thersia Flax, MD  Turmeric 400 MG CAPS Take 400 mg by mouth daily.     [provider]  vitamin B-12 (CYANOCOBALAMIN )  100 MCG tablet Take 100 mcg by mouth daily.    [provider]  levalbuterol  (XOPENEX  HFA) 45 MCG/ACT inhaler Inhale 2 puffs into the lungs every 6 (six) hours as needed for wheezing. 11/09/21 11/10/21  Dellar Fenton, MD    Family History Family History  Problem Relation Age of Onset   Hypertension Mother    Hyperlipidemia Mother    Arthritis Mother    Rheum arthritis Mother    Hypertension Father    Hyperlipidemia Father    Cancer Maternal Grandmother        COLON CANCER   Breast cancer Neg Hx     Social History Social History   Tobacco Use   Smoking status: Never   Smokeless tobacco: Never  Vaping Use   Vaping status: Never Used  Substance Use Topics   Alcohol use: Yes    Alcohol/week: 2.0 standard drinks of  alcohol    Types: 1 Glasses of wine, 1 Cans of beer per week    Comment: social   Drug use: No     Allergies   Chlorhexidine  gluconate and Latex   Review of Systems Review of Systems  Constitutional:        Malaise  Gastrointestinal:  Positive for abdominal pain.  Genitourinary:  Positive for dysuria.     Physical Exam Triage Vital Signs ED Triage Vitals  Encounter Vitals Group     BP 10/30/23 1645 117/79     Girls Systolic BP Percentile --      Girls Diastolic BP Percentile --      Boys Systolic BP Percentile --      Boys Diastolic BP Percentile --      Pulse Rate 10/30/23 1645 70     Resp 10/30/23 1645 18     Temp 10/30/23 1645 98 F (36.7 C)     Temp Source 10/30/23 1645 Oral     SpO2 10/30/23 1645 100 %     Weight 10/30/23 1643 210 lb (95.3 kg)     Height 10/30/23 1643 5' 2 (1.575 m)     Head Circumference --      Peak Flow --      Pain Score 10/30/23 1643 2     Pain Loc --      Pain Education --      Exclude from Growth Chart --    No data found.  Updated Vital Signs BP 117/79 (BP Location: Right Arm)   Pulse 70   Temp 98 F (36.7 C) (Oral)   Resp 18   Ht 5' 2 (1.575 m)   Wt 210 lb (95.3 kg)   SpO2 100%   BMI  38.41 kg/m   Visual Acuity Right Eye Distance:   Left Eye Distance:   Bilateral Distance:    Right Eye Near:   Left Eye Near:    Bilateral Near:     Physical Exam Vitals and nursing note reviewed.  Constitutional:      Appearance: Normal appearance.  HENT:     Head: Normocephalic and atraumatic.   Eyes:     Pupils: Pupils are equal, round, and reactive to light.    Cardiovascular:     Rate and Rhythm: Normal rate.  Pulmonary:     Effort: Pulmonary effort is normal.  Abdominal:     Tenderness: There is no right CVA tenderness or left CVA tenderness.   Skin:    General: Skin is warm and dry.   Neurological:     General: No focal deficit present.     Mental Status: She is alert and oriented to person, place, and time.   Psychiatric:        Mood and Affect: Mood normal.        Behavior: Behavior normal.      UC Treatments / Results  Labs (all labs ordered are listed, but only abnormal results are displayed) Labs Reviewed  POCT URINALYSIS DIP (MANUAL ENTRY) - Normal  POCT URINE PREGNANCY - Normal  URINE CULTURE  CBC WITH DIFFERENTIAL/PLATELET    EKG   Radiology No results found.  Procedures Procedures (including critical care time)  Medications Ordered in UC Medications - No data to display  Initial Impression / Assessment and Plan / UC Course  I have reviewed the triage vital signs and the nursing notes.  Pertinent labs & imaging results that were available during my care of the patient  were reviewed by me and considered in my medical decision making (see chart for details).     Reviewed exam and symptoms with patient.  He is well-appearing, afebrile in clinic and in no acute distress.  Patient on day 6 of Keflex  for presumed UTI with near resolution of symptoms.  Onset of malaise a day or 2 ago and is concerned for pyelonephritis.  No signs on exam.  She is afebrile and has no CVAT.  Will send urine for culture and patient also requested CBC which  was drawn.  Will extend Keflex  for another 3 days while awaiting results.  Patient requested Diflucan  for history of antibiotic induced yeast infections.  She is to stay hydrated and follow-up with her PCP in 2 days for recheck.  ER precautions reviewed. Final Clinical Impressions(s) / UC Diagnoses   Final diagnoses:  Malaise  Dysuria     Discharge Instructions      The clinic will contact you with results of the urine culture and blood work done today if positive.  Asked your Keflex  and increased dosing for the next 3 days.  Diflucan  to prevent antibiotic induced yeast infections.  Lots of rest and fluids.  Please follow-up with your PCP in 2 days for recheck.  Please go to the ER for any worsening symptoms.  Hope you feel better soon!     ED Prescriptions     Medication Sig Dispense Auth. Provider   cephALEXin  (KEFLEX ) 500 MG capsule Take 1 capsule (500 mg total) by mouth 4 (four) times daily for 3 days. 12 capsule Payden Docter, Jodi R, NP   fluconazole  (DIFLUCAN ) 150 MG tablet Take 1 tablet (150 mg total) by mouth daily. Take 1 tablet today and you may repeat in 3 days if needed 2 tablet Avaline Stillson, Jodi R, NP      PDMP not reviewed this encounter.   Alleen Arbour, NP 10/30/23 1721

## 2023-10-30 NOTE — Telephone Encounter (Signed)
 Lvm for pt. Pt needs appt with any available provider. Okay to schedule

## 2023-10-30 NOTE — Telephone Encounter (Signed)
 Pt is scheduled to see Kacy on 11/02/2023.

## 2023-10-30 NOTE — Telephone Encounter (Signed)
 FYI Only or Action Required?: Action required by provider  Patient was last seen in primary care on 07/14/2023 by Thersia Flax, MD. Called Nurse Triage reporting Urinary Urgency and Abdominal Pain. Symptoms began a week ago. Interventions attempted: Prescription medications: Keflex  and Rest, hydration, or home remedies. Symptoms are: unchanged.  Triage Disposition: See Physician Within 24 Hours  Patient/caregiver understands and will follow disposition?: No, wishes to speak with PCP  Copied from CRM [id number]. Topic: Clinical - Red Word Triage >> Oct 30, 2023 12:44 PM Clyde Darling P wrote: Red Word that prompted transfer to Nurse Triage: bladder infection getting worst was treated a week ago    ----------------------------------------------------------------------- From previous Reason for Contact - Scheduling: Patient/patient representative is calling to schedule an appointment. Refer to attachments for appointment information. Reason for Disposition  [1] Taking antibiotic > 72 hours (3 days) for UTI AND [2] flank or lower back pain is SAME (unchanged, not better)  Answer Assessment - Initial Assessment Questions 1. MAIN SYMPTOM: What is the main symptom you are concerned about? (e.g., painful urination, urine frequency)     Lower abdominal pain and general weakness 2. BETTER-SAME-WORSE: Are you getting better, staying the same, or getting worse compared to how you felt at your last visit to the doctor (most recent medical visit)?     Patient reports still not feeling well. Has one more day of antibiotic 3. PAIN: How bad is the pain?  (e.g., Scale 1-10; mild, moderate, or severe)   - MILD (1-3): complains slightly about urination hurting   - MODERATE (4-7): interferes with normal activities     - SEVERE (8-10): excruciating, unwilling or unable to urinate because of the pain      Mild 4. FEVER: Do you have a fever? If Yes, ask: What is it, how was it measured, and when did it  start?     No measured temperature but patient reports feeling feverish 5. OTHER SYMPTOMS: Do you have any other symptoms? (e.g., blood in the urine, flank pain, vaginal discharge)     no 6. DIAGNOSIS: When was the UTI diagnosed? By whom? Was it a kidney infection, bladder infection or both?     Diagnosed a week ago 7. ANTIBIOTIC: What antibiotic(s) are you taking? How many times per day?     Keflex  twice a day 8. ANTIBIOTIC - START DATE: When did you start taking the antibiotic?     Started 10/25/2023  Patient was seen via virtual visit for UTI symptoms last week and started on keflex . Patient reports still not feeling well with some mild abdominal pain.  Protocols used: Urinary Tract Infection on Antibiotic Follow-up Call - [facility]

## 2023-10-30 NOTE — Discharge Instructions (Addendum)
 The clinic will contact you with results of the urine culture and blood work done today if positive.  Asked your Keflex  and increased dosing for the next 3 days.  Diflucan  to prevent antibiotic induced yeast infections.  Lots of rest and fluids.  Please follow-up with your PCP in 2 days for recheck.  Please go to the ER for any worsening symptoms.  Hope you feel better soon!

## 2023-10-30 NOTE — ED Triage Notes (Signed)
 Patient states she is being treated for UTI w/ Keflex  since last Wed and thought she was getting better but today feels feverish w/ chills and over all feels bed.  Continues to have abdominal pain/tenderness.  Did an E visit last time so did not provide sample.

## 2023-10-31 ENCOUNTER — Ambulatory Visit (HOSPITAL_COMMUNITY): Payer: Self-pay

## 2023-10-31 LAB — CBC WITH DIFFERENTIAL/PLATELET
Basophils Absolute: 0.1 10*3/uL (ref 0.0–0.2)
Basos: 1 %
EOS (ABSOLUTE): 0.1 10*3/uL (ref 0.0–0.4)
Eos: 1 %
Hematocrit: 46.2 % (ref 34.0–46.6)
Hemoglobin: 14.7 g/dL (ref 11.1–15.9)
Immature Grans (Abs): 0.1 10*3/uL (ref 0.0–0.1)
Immature Granulocytes: 1 %
Lymphocytes Absolute: 2.4 10*3/uL (ref 0.7–3.1)
Lymphs: 29 %
MCH: 28.4 pg (ref 26.6–33.0)
MCHC: 31.8 g/dL (ref 31.5–35.7)
MCV: 89 fL (ref 79–97)
Monocytes Absolute: 0.4 10*3/uL (ref 0.1–0.9)
Monocytes: 5 %
Neutrophils Absolute: 5.2 10*3/uL (ref 1.4–7.0)
Neutrophils: 63 %
Platelets: 304 10*3/uL (ref 150–450)
RBC: 5.18 x10E6/uL (ref 3.77–5.28)
RDW: 12.5 % (ref 11.7–15.4)
WBC: 8.2 10*3/uL (ref 3.4–10.8)

## 2023-10-31 LAB — URINE CULTURE: Culture: NO GROWTH

## 2023-11-02 ENCOUNTER — Ambulatory Visit: Admitting: Nurse Practitioner

## 2023-11-06 NOTE — Progress Notes (Unsigned)
 [name] [address] [place] [address] Phone: [phone] Subjective:   Lisa [name], am serving as a scribe for Dr. Arthea Claudene.  I'm seeing this patient by the request  of:  Marylynn Verneita CROME, MD  CC: Back and neck pain follow-up  YEP:Dlagzrupcz  Lisa [name] is a 48 y.o. female coming in with complaint of back and neck pain. OMT on 09/15/2023. Patient states that she has been good since last visit.   Medications patient has been prescribed:   Taking:         Reviewed prior external information including notes and imaging from previsou exam, outside providers and external EMR if available.   As well as notes that were available from care everywhere and other healthcare systems.  Past medical history, social, surgical and family history all reviewed in electronic medical record.  No pertanent information unless stated regarding to the chief complaint.   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 POSITIVE muscle aches  Objective  Blood pressure 110/76, pulse 80, height 5' 2 (1.575 m), weight 215 lb (97.5 kg), SpO2 97%.   General: No apparent distress alert and oriented x3 mood and affect normal, dressed appropriately.  HEENT: Pupils equal, extraocular movements intact  Respiratory: Patient's speak in full sentences and does not appear short of breath  Cardiovascular: No lower extremity edema, non tender, no erythema  Gait MSK:  Back does have some loss lordosis.  Tenderness to palpation of the paraspinal musculature.  Tightness noted of the neck with sidebending.  Low back still no radicular symptoms.  Osteopathic findings  C2 flexed rotated and side bent right C6 flexed rotated and side bent left T3 extended rotated and side bent right inhaled rib T4 extended rotated and side bent right with inhaled. T9 extended rotated and side bent left L2 flexed rotated and side bent right Sacrum right on right     Assessment and Plan:  Chronic bilateral thoracic back pain Seem to be more in the mid back today.  Seems to be somewhat is significant for the slipped rib syndrome.  Patient has done relatively good weight loss but will continue on her journey.  Discussed with patient about posture  and ergonomics otherwise.  No change in medication today.  Follow-up again in 6 to 8 weeks    Nonallopathic problems  Decision today to treat with OMT was based on Physical Exam  After verbal consent patient was treated with HVLA, ME, FPR techniques in cervical, rib, thoracic, lumbar, and sacral  areas  Patient tolerated the procedure well with improvement in symptoms  Patient given exercises, stretches and lifestyle modifications  See medications in  patient instructions if given  Patient will follow up in 4-8 weeks    The above documentation has been reviewed and is accurate and complete [name] [name], DO          Note: This dictation was prepared with Dragon dictation along with smaller phrase technology. Any transcriptional errors that result from this process are unintentional.

## 2023-11-13 ENCOUNTER — Ambulatory Visit (INDEPENDENT_AMBULATORY_CARE_PROVIDER_SITE_OTHER): Admitting: Family Medicine

## 2023-11-13 VITALS — BP 110/76 | HR 80 | Ht 62.0 in | Wt 215.0 lb

## 2023-11-13 DIAGNOSIS — M9902 Segmental and somatic dysfunction of thoracic region: Secondary | ICD-10-CM | POA: Diagnosis not present

## 2023-11-13 DIAGNOSIS — M940 Chondrocostal junction syndrome [Tietze]: Secondary | ICD-10-CM

## 2023-11-13 DIAGNOSIS — M9908 Segmental and somatic dysfunction of rib cage: Secondary | ICD-10-CM | POA: Diagnosis not present

## 2023-11-13 DIAGNOSIS — M9903 Segmental and somatic dysfunction of lumbar region: Secondary | ICD-10-CM | POA: Diagnosis not present

## 2023-11-13 DIAGNOSIS — M9904 Segmental and somatic dysfunction of sacral region: Secondary | ICD-10-CM | POA: Diagnosis not present

## 2023-11-13 DIAGNOSIS — M546 Pain in thoracic spine: Secondary | ICD-10-CM | POA: Diagnosis not present

## 2023-11-13 DIAGNOSIS — M9901 Segmental and somatic dysfunction of cervical region: Secondary | ICD-10-CM

## 2023-11-13 DIAGNOSIS — G8929 Other chronic pain: Secondary | ICD-10-CM

## 2023-11-13 NOTE — Patient Instructions (Signed)
 If hubby keeps whining come see me See me in 3 months

## 2023-11-14 ENCOUNTER — Encounter: Payer: Self-pay | Admitting: Family Medicine

## 2023-11-14 NOTE — Assessment & Plan Note (Signed)
 Seem to be more in the mid back today.  Seems to be somewhat is significant for the slipped rib syndrome.  Patient has done relatively good weight loss but will continue on her journey.  Discussed with patient about posture and ergonomics otherwise.  No change in medication today.  Follow-up again in 6 to 8 weeks

## 2023-12-15 ENCOUNTER — Ambulatory Visit: Admitting: Pulmonary Disease

## 2023-12-15 ENCOUNTER — Encounter: Payer: Self-pay | Admitting: Pulmonary Disease

## 2023-12-15 VITALS — BP 110/72 | HR 65 | Temp 97.5°F | Ht 62.0 in | Wt 211.0 lb

## 2023-12-15 DIAGNOSIS — J4530 Mild persistent asthma, uncomplicated: Secondary | ICD-10-CM | POA: Diagnosis not present

## 2023-12-15 DIAGNOSIS — J383 Other diseases of vocal cords: Secondary | ICD-10-CM | POA: Diagnosis not present

## 2023-12-15 DIAGNOSIS — J386 Stenosis of larynx: Secondary | ICD-10-CM

## 2023-12-15 DIAGNOSIS — R0602 Shortness of breath: Secondary | ICD-10-CM

## 2023-12-15 LAB — NITRIC OXIDE: Nitric Oxide: 8

## 2023-12-15 NOTE — Progress Notes (Signed)
 Subjective:    Patient ID: Lisa [name], female    DOB: May 22, 1975, 48 y.o.   MRN: [id number]  Patient Care Team: Marylynn Verneita CROME, MD as PCP - General (Internal Medicine) Tamea Dedra CROME, MD as Consulting Physician (Pulmonary Disease)  Chief Complaint  Patient presents with   Follow-up    No SOB, wheezing or cough.     BACKGROUND/INTERVAL:Kyriana is a 48 year old lifelong never smoker with a prior history of subglottic stenosis status tracheal dilation in 2014 and redo dilation in 2021 by Dr. Vaughan Ricker, resents for follow-up of mild persistent asthmatic bronchitis and vocal cord dysfunction.  She was last seen on 05 July 2023.  HPI Discussed the use of AI scribe software for clinical note transcription with the patient, who gave verbal consent to proceed.  History of Present Illness   Lisa [name] is a 48 year old female with asthma and subglottic stenosis who presents for follow-up.  She has asthma and uses Trelegy daily, although she sometimes misses doses. Increased symptoms such as rhinorrhea and feeling 'gunky' in the morning occur when she skips her medication. Recently, she has been using Trelegy more consistently. No significant dyspnea is noted, and she recovers quickly after physical exertion, such as climbing stairs while carrying a heavy lead apron. She mentions intentional weight loss (or Mounjaro ), which she feels is beneficial.  She has a history of subglottic stenosis, previously treated.  It has been a year since she had COVID-19, and despite multiple exposures and international travel, she has not contracted the virus again.    Overall she feels well and looks well.  Review of Systems A 10 point review of systems was performed and it is as noted above otherwise negative.   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   Chronic bronchitis (HCC) 04/04/2022   GERD (gastroesophageal reflux disease) 04/04/2022   Complication of anesthesia 03/08/2022   Family history of colon cancer requiring screening colonoscopy 02/24/2022   Trigger point of left shoulder region 10/06/2021   Weakness of left arm 03/03/2020   Spondylosis, cervical, 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   Loud snoring 08/01/2019   B12 deficiency 08/01/2019   Hyperlipidemia associated with type 2 diabetes mellitus (HCC) 07/31/2019   Nonallopathic lesion of cervical region 04/29/2019   Nonallopathic lesion of thoracic region 04/29/2019   Nonallopathic lesion of rib cage 04/29/2019   Cervical radiculopathy at C7 03/26/2019   Breast tenderness in female 01/24/2019   Allergic rhinitis due to pollen 08/18/2018   Type 2 diabetes mellitus with morbid obesity (HCC) 05/02/2017   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   Encounter for screening colonoscopy 04/29/2016   Low back pain without sciatica 04/28/2015   Morbid obesity (HCC) 04/28/2015    Social History   Tobacco Use   Smoking status: Never   Smokeless tobacco: Never  Substance Use Topics   Alcohol use: Yes    Alcohol/week: 2.0 standard drinks of alcohol    Types: 1 Glasses of wine, 1 Cans of beer per week    Comment: social    Allergies  Allergen Reactions   Chlorhexidine  Gluconate     Developed pruritic rash on abdomen from prep for lap chole Mar 09 2022   Latex Hives    Rash  and hives    Current Meds  Medication Sig   albuterol  (VENTOLIN  HFA) 108 (90 Base) MCG/ACT inhaler Inhale 2 puffs into the lungs every 6 (six) hours as needed for wheezing or shortness of breath.   cetirizine (ZYRTEC) 10 MG tablet Take 10 mg by mouth daily.   cholecalciferol (VITAMIN D3) 25 MCG (1000 UNIT) tablet Take 1,000 Units by mouth daily.   etonogestrel  (NEXPLANON ) 68  MG IMPL implant 1 each by Subdermal route once.   fluconazole  (DIFLUCAN ) 150 MG tablet Take 1 tablet (150 mg total) by mouth daily. Take 1 tablet today and you may repeat in 3 days if needed   fluticasone  (FLONASE ) 50 MCG/ACT nasal spray Place 2 sprays into both nostrils daily.   Fluticasone -Umeclidin-Vilant (TRELEGY ELLIPTA ) 100-62.5-25 MCG/ACT AEPB Inhale 1 puff into the lungs daily.   folic acid  (FOLVITE ) 1 MG tablet Take 1 tablet (1 mg total) by mouth daily.   Magnesium 100 MG TABS Take 1 tablet by mouth daily.   omeprazole  (PRILOSEC) 40 MG capsule Take 1 capsule (40 mg total) by mouth daily.   scopolamine  (TRANSDERM-SCOP) 1 MG/3DAYS Place 1 patch (1.5 mg total) onto the skin every 3 (three) days.   spironolactone  (ALDACTONE ) 25 MG tablet Take 1 tablet (25 mg total) by mouth daily.   tirzepatide  (MOUNJARO ) 12.5 MG/0.5ML Pen Inject 12.5 mg into the skin once a week.   Turmeric 400 MG CAPS Take 400 mg by mouth daily.    vitamin B-12 (CYANOCOBALAMIN ) 100 MCG tablet Take 100 mcg by mouth daily.    Immunization History  Administered Date(s) Administered   Influenza Split 02/25/2015, 01/30/2017   Influenza-Unspecified 02/09/2016, 01/21/2018, 02/12/2019, 03/13/2020, 02/10/2021, 02/10/2022, 02/07/2023   Moderna Sars-Covid-2 Vaccination 05/06/2019, 05/27/2019, 05/12/2020   PNEUMOCOCCAL CONJUGATE-20 01/07/2021   Tdap 04/27/2016        Objective:     BP 110/72 (BP Location: Right Arm, Cuff Size: Large)   Pulse 65   Temp (!) 97.5 F (36.4 C)   Ht 5' 2 (1.575 m)   Wt 211 lb (95.7 kg)   SpO2 100%   BMI 38.59 kg/m   SpO2: 100 % O2 Device: None (Room air)  GENERAL: Obese woman, no acute distress, fully ambulatory. No conversational dyspnea. HEAD: Normocephalic, atraumatic.  EYES: Pupils equal, round, reactive to light. No scleral icterus.  MOUTH: Nose/mouth/throat not examined due to institutional masking requirements. NECK: Supple. No thyromegaly. Trachea midline. No JVD. No  adenopathy. No stridor. PULMONARY: Good air entry bilaterally. No adventitious sounds. CARDIOVASCULAR: S1 and S2. Regular rate and rhythm.  ABDOMEN: Obese, otherwise benign. MUSCULOSKELETAL: No joint deformity, no clubbing, no edema.  NEUROLOGIC: No overt focal deficit, no gait disturbance, speech is fluent. SKIN: Intact,warm,dry. PSYCH: Mood and behavior normal.    Lab Results  Component Value Date   NITRICOXIDE 8 12/15/2023  *No evidence of type II inflammation noted.    Assessment & Plan:     ICD-10-CM   1. Mild persistent asthmatic bronchitis without complication  J45.30 Nitric oxide     2. Subglottic stenosis  J38.6     3. Vocal cord dysfunction  J38.3     4. Shortness of breath  R06.02       Orders Placed This Encounter  Procedures   Nitric oxide    Discussion:    Asthma Asthma is well-controlled with daily Trelegy. Occasional non-compliance leads to mild symptoms such as morning gunkiness and rhinorrhea. No significant dyspnea, even with exertion. Recent weight loss may contribute to improved respiratory function. Satisfactory  nitric oxide  levels likely due to consistent medication use. - Continue Trelegy daily and ensure proper rinsing after use. - Schedule follow-up appointment in six months. - No need for pulmonary function tests at this time; reassess at next visit.      Advised if symptoms do not improve or worsen, to please contact office for sooner follow up or seek emergency care.    I spent 33 minutes of dedicated to the care of this patient on the date of this encounter to include pre-visit review of records, face-to-face time with the patient discussing conditions above, post visit ordering of testing, clinical documentation with the electronic health record, making appropriate referrals as documented, and communicating necessary findings to members of the patients care team.     C. Leita Sanders, MD [facility]    *This note was generated using voice recognition software/Dragon and/or AI transcription program.  Despite best efforts to proofread, errors can occur which can change the meaning. Any transcriptional errors that result from this process are unintentional and may not be fully corrected at the time of dictation.

## 2023-12-15 NOTE — Patient Instructions (Signed)
 VISIT SUMMARY:  Today, we discussed your asthma and overall respiratory health. You mentioned that you have been more consistent with your medication recently and have experienced some weight loss, which you feel has been beneficial.  YOUR PLAN:  -ASTHMA/ASTHMATIC BRONCHITIS: Asthma is a condition where your airways narrow and swell, producing extra mucus, which can make breathing difficult. Your asthma is well-controlled with daily use of Trelegy, although skipping doses can lead to mild symptoms like morning gunkiness and a runny nose. Continue taking Trelegy daily and make sure to rinse your mouth after each use. We will reassess your need for pulmonary function tests at your next visit.  -SUBGLOTTIC STENOSIS: Has not required intervention since redo dilation in 2021 by Dr. Vaughan Ricker.  Continue to monitor for symptoms of increased shortness of breath, etc.  INSTRUCTIONS:  Please schedule a follow-up appointment in six months.  Call sooner should any new problems arise.

## 2023-12-20 ENCOUNTER — Encounter: Payer: Self-pay | Admitting: Pulmonary Disease

## 2024-01-10 ENCOUNTER — Other Ambulatory Visit: Payer: Self-pay

## 2024-01-11 ENCOUNTER — Ambulatory Visit: Payer: Commercial Managed Care - PPO | Admitting: Internal Medicine

## 2024-01-11 ENCOUNTER — Other Ambulatory Visit: Payer: Self-pay

## 2024-01-11 ENCOUNTER — Encounter: Payer: Self-pay | Admitting: Internal Medicine

## 2024-01-11 DIAGNOSIS — J40 Bronchitis, not specified as acute or chronic: Secondary | ICD-10-CM

## 2024-01-11 DIAGNOSIS — Z87440 Personal history of urinary (tract) infections: Secondary | ICD-10-CM | POA: Diagnosis not present

## 2024-01-11 DIAGNOSIS — Z1231 Encounter for screening mammogram for malignant neoplasm of breast: Secondary | ICD-10-CM | POA: Diagnosis not present

## 2024-01-11 DIAGNOSIS — E785 Hyperlipidemia, unspecified: Secondary | ICD-10-CM | POA: Diagnosis not present

## 2024-01-11 DIAGNOSIS — E1169 Type 2 diabetes mellitus with other specified complication: Secondary | ICD-10-CM

## 2024-01-11 DIAGNOSIS — R7401 Elevation of levels of liver transaminase levels: Secondary | ICD-10-CM

## 2024-01-11 DIAGNOSIS — Z7985 Long-term (current) use of injectable non-insulin antidiabetic drugs: Secondary | ICD-10-CM | POA: Diagnosis not present

## 2024-01-11 DIAGNOSIS — N390 Urinary tract infection, site not specified: Secondary | ICD-10-CM

## 2024-01-11 LAB — COMPREHENSIVE METABOLIC PANEL WITH GFR
ALT: 15 U/L (ref 0–35)
AST: 13 U/L (ref 0–37)
Albumin: 4.3 g/dL (ref 3.5–5.2)
Alkaline Phosphatase: 50 U/L (ref 39–117)
BUN: 12 mg/dL (ref 6–23)
CO2: 25 meq/L (ref 19–32)
Calcium: 9 mg/dL (ref 8.4–10.5)
Chloride: 106 meq/L (ref 96–112)
Creatinine, Ser: 0.75 mg/dL (ref 0.40–1.20)
GFR: 95.08 mL/min (ref >60.00)
Glucose, Bld: 86 mg/dL (ref 70–99)
Potassium: 4.2 meq/L (ref 3.5–5.1)
Sodium: 139 meq/L (ref 135–145)
Total Bilirubin: 0.8 mg/dL (ref 0.2–1.2)
Total Protein: 6.6 g/dL (ref 6.0–8.3)

## 2024-01-11 LAB — LIPID PANEL
Cholesterol: 172 mg/dL (ref 0–200)
HDL: 45.1 mg/dL (ref >39.00)
LDL Cholesterol: 102 mg/dL — ABNORMAL HIGH (ref 0–99)
NonHDL: 126.44
Total CHOL/HDL Ratio: 4
Triglycerides: 124 mg/dL (ref 0.0–149.0)
VLDL: 24.8 mg/dL (ref 0.0–40.0)

## 2024-01-11 LAB — LDL CHOLESTEROL, DIRECT: Direct LDL: 102 mg/dL

## 2024-01-11 LAB — HEMOGLOBIN A1C: Hgb A1c MFr Bld: 5.5 % (ref 4.6–6.5)

## 2024-01-11 MED ORDER — TIRZEPATIDE 12.5 MG/0.5ML ~~LOC~~ SOAJ
12.5000 mg | SUBCUTANEOUS | 2 refills | Status: AC
  Filled 2024-02-08: qty 2, 28d supply, fill #0
  Filled 2024-03-07: qty 2, 28d supply, fill #1
  Filled 2024-04-10: qty 2, 28d supply, fill #2
  Filled 2024-05-12: qty 2, 28d supply, fill #3
  Filled 2024-06-09: qty 2, 28d supply, fill #4

## 2024-01-11 MED ORDER — OMEPRAZOLE 40 MG PO CPDR
40.0000 mg | DELAYED_RELEASE_CAPSULE | Freq: Every day | ORAL | 1 refills | Status: DC
  Filled 2024-01-11: qty 90, 90d supply, fill #0

## 2024-01-11 MED ORDER — ALBUTEROL SULFATE HFA 108 (90 BASE) MCG/ACT IN AERS
2.0000 | INHALATION_SPRAY | Freq: Four times a day (QID) | RESPIRATORY_TRACT | 0 refills | Status: AC | PRN
Start: 2024-01-11 — End: ?
  Filled 2024-01-11: qty 6.7, 25d supply, fill #0

## 2024-01-11 NOTE — Progress Notes (Signed)
 Subjective:  Patient ID: Lisa [name], female    DOB: 02-27-1976  Age: 48 y.o. MRN: [id number]  CC: The primary encounter diagnosis was Type 2 diabetes mellitus with morbid obesity (HCC). Diagnoses of Hyperlipidemia associated with type 2 diabetes mellitus (HCC), Bronchitis, Encounter for screening mammogram for malignant neoplasm of breast, Recurrent UTI, Transaminitis, and History of UTI were also pertinent to this visit.   HPI BRITISH MOYD presents for  Chief Complaint  Patient presents with   Medical Management of Chronic Issues   1) Morbid obesity/Type 2 DM:  She  feels generally well, is exercising several times per week and checking blood sugars once daily at variable times.  BS have been under 130 fasting and < 150 post prandially.  Denies any recent hypoglyemic events.  Taking Mounjaro  12.5 mg weekly  as directed. Following a carbohydrate modified diet 6 days per week. Denies numbness, burning and tingling of extremities. Appetite is diminished      2) Recent UTI:  she was treated by Urgent Care on Jun 11 with cephalexin  for dysuria,  but was reevaluated on June 16 for persistent malaise, subjectvie fevera nd chills.  Cephalexin  was repeated; urine culture was negative.   Outpatient Medications Prior to Visit  Medication Sig Dispense Refill   cetirizine (ZYRTEC) 10 MG tablet Take 10 mg by mouth daily.     cholecalciferol (VITAMIN D3) 25 MCG (1000 UNIT) tablet Take 1,000 Units by mouth daily.     etonogestrel  (NEXPLANON ) 68 MG IMPL implant 1 each by Subdermal route once.     fluticasone  (FLONASE ) 50 MCG/ACT nasal spray Place 2 sprays into both nostrils daily. 16 g 2   Fluticasone -Umeclidin-Vilant (TRELEGY ELLIPTA ) 100-62.5-25 MCG/ACT AEPB Inhale 1 puff into the lungs daily. 60 each 11   folic acid  (FOLVITE ) 1 MG tablet Take 1 tablet (1 mg total) by mouth daily. 90 tablet 1   Magnesium 100 MG TABS Take 1 tablet by mouth daily.     scopolamine  (TRANSDERM-SCOP) 1  MG/3DAYS Place 1 patch (1.5 mg total) onto the skin every 3 (three) days. 4 patch 0   spironolactone  (ALDACTONE ) 25 MG tablet Take 1 tablet (25 mg total) by mouth daily. 90 tablet 3   Turmeric 400 MG CAPS Take 400 mg by mouth daily.      vitamin B-12 (CYANOCOBALAMIN ) 100 MCG tablet Take 100 mcg by mouth daily.     albuterol  (VENTOLIN  HFA) 108 (90 Base) MCG/ACT inhaler Inhale 2 puffs into the lungs every 6 (six) hours as needed for wheezing or shortness of breath. 6.7 g 0   omeprazole  (PRILOSEC) 40 MG capsule Take 1 capsule (40 mg total) by mouth daily. 90 capsule 1   tirzepatide  (MOUNJARO ) 12.5 MG/0.5ML Pen Inject 12.5 mg into the skin once a week. 6 mL 2   fluconazole  (DIFLUCAN ) 150 MG tablet Take 1 tablet (150 mg total) by mouth daily. Take 1 tablet today and you may repeat in 3 days if needed 2 tablet 0   levalbuterol  (XOPENEX  HFA) 45 MCG/ACT inhaler Inhale 2 puffs into the lungs every 6 (six) hours as needed for wheezing. 15 g 1   No facility-administered medications prior to visit.    Review of Systems;  Patient denies headache, fevers, malaise, unintentional weight loss, skin rash, eye pain, sinus congestion and sinus pain, sore throat, dysphagia,  hemoptysis , cough, dyspnea, wheezing, chest pain, palpitations, orthopnea, edema, abdominal pain, nausea, melena, diarrhea, constipation, flank pain, dysuria, hematuria, urinary  Frequency, nocturia, numbness, tingling,  seizures,  Focal weakness, Loss of consciousness,  Tremor, insomnia, depression, anxiety, and suicidal ideation.      Objective:  BP 110/66   Pulse 73   Ht 5' 2 (1.575 m)   Wt 209 lb 3.2 oz (94.9 kg)   SpO2 99%   BMI 38.26 kg/m   BP Readings from Last 3 Encounters:  01/11/24 110/66  12/15/23 110/72  11/13/23 110/76    Wt Readings from Last 3 Encounters:  01/11/24 209 lb 3.2 oz (94.9 kg)  12/15/23 211 lb (95.7 kg)  11/13/23 215 lb (97.5 kg)    Physical Exam Vitals reviewed.  Constitutional:      General:  She is not in acute distress.    Appearance: Normal appearance. She is normal weight. She is not ill-appearing, toxic-appearing or diaphoretic.  HENT:     Head: Normocephalic.  Eyes:     General: No scleral icterus.       Right eye: No discharge.        Left eye: No discharge.     Conjunctiva/sclera: Conjunctivae normal.  Cardiovascular:     Rate and Rhythm: Normal rate and regular rhythm.     Heart sounds: Normal heart sounds.  Pulmonary:     Effort: Pulmonary effort is normal. No respiratory distress.     Breath sounds: Normal breath sounds.  Musculoskeletal:        General: Normal range of motion.  Skin:    General: Skin is warm and dry.  Neurological:     General: No focal deficit present.     Mental Status: She is alert and oriented to person, place, and time. Mental status is at baseline.  Psychiatric:        Mood and Affect: Mood normal.        Behavior: Behavior normal.        Thought Content: Thought content normal.        Judgment: Judgment normal.     Lab Results  Component Value Date   HGBA1C 5.5 01/11/2024   HGBA1C 5.4 07/14/2023   HGBA1C 5.5 03/29/2023    Lab Results  Component Value Date   CREATININE 0.75 01/11/2024   CREATININE 0.69 07/14/2023   CREATININE 0.71 03/29/2023    Lab Results  Component Value Date   WBC 8.2 10/30/2023   HGB 14.7 10/30/2023   HCT 46.2 10/30/2023   PLT 304 10/30/2023   GLUCOSE 86 01/11/2024   CHOL 172 01/11/2024   TRIG 124.0 01/11/2024   HDL 45.10 01/11/2024   LDLDIRECT 102.0 01/11/2024   LDLCALC 102 (H) 01/11/2024   ALT 15 01/11/2024   AST 13 01/11/2024   NA 139 01/11/2024   K 4.2 01/11/2024   CL 106 01/11/2024   CREATININE 0.75 01/11/2024   BUN 12 01/11/2024   CO2 25 01/11/2024   TSH 1.33 07/14/2023   HGBA1C 5.5 01/11/2024   MICROALBUR <0.7 07/14/2023    No results found.  Assessment & Plan:  .Type 2 diabetes mellitus with morbid obesity (HCC) Assessment & Plan: She has lost 62 lbs since 2020 .  She  is currently taking Mounjaro  12.5 mg weekly  in March after maxing out ozempic  and experiencing a weight plateau,  since the change has lost 9 lbs . BMI remins > 35.  Discussed an  increase  in dose to 15 mg , but she prefers to wait.  Statin advised   Lab Results  Component Value Date   HGBA1C 5.4 07/14/2023   Lab Results  Component Value Date   MICROALBUR <0.7 07/14/2023       Orders: -     Comprehensive metabolic panel with GFR -     Hemoglobin A1c  Hyperlipidemia associated with type 2 diabetes mellitus (HCC) Assessment & Plan: 10 yr risk is low  Based on prior labs.  Repeat labs reviewed; ;  advising trial of statin for primary prevention and goal LDL , 70 given concurrent type 2 DM   Lab Results  Component Value Date   CHOL 172 01/11/2024   HDL 45.10 01/11/2024   LDLCALC 102 (H) 01/11/2024   LDLDIRECT 102.0 01/11/2024   TRIG 124.0 01/11/2024   CHOLHDL 4 01/11/2024     Orders: -     Lipid panel -     LDL cholesterol, direct  Bronchitis -     Albuterol  Sulfate HFA; Inhale 2 puffs into the lungs every 6 (six) hours as needed for wheezing or shortness of breath.  Dispense: 6.7 g; Refill: 0  Encounter for screening mammogram for malignant neoplasm of breast -     3D Screening Mammogram, Left and Right; Future  Recurrent UTI -     Urinalysis, Routine w reflex microscopic; Future -     Urine Culture; Future  Transaminitis Assessment & Plan: Resolved. All serologies were  negative for viral and autoimmune etiologies.SABRA MRCP was done to rule out retained stone from prior cholecystectomy   Lab Results  Component Value Date   ALT 15 01/11/2024   AST 13 01/11/2024   ALKPHOS 50 01/11/2024   BILITOT 0.8 01/11/2024      History of UTI Assessment & Plan: Advised to submit urine for UA/micr and culture at onset of next occurrence.  Sterile collection container given and future UA/culture ordered    Other orders -     Omeprazole ; Take 1 capsule (40 mg total) by  mouth daily.  Dispense: 90 capsule; Refill: 1 -     Tirzepatide ; Inject 12.5 mg into the skin once a week.  Dispense: 6 mL; Refill: 2    .   Follow-up: No follow-ups on file.   Verneita LITTIE Kettering, MD

## 2024-01-11 NOTE — Patient Instructions (Addendum)
 YOUR MAMMOGRAM IS DUE after 02/27/2024, PLEASE CALL AND GET THIS SCHEDULED! [facility] - call [phone]   Your foot pain may be due to a Morton's neuroma.  Try adding a gel inset to increase the padding   If you develop urinary symptoms again, please use the supplies I gave you to collect a urine sample and call the office to arrange drop off and a virtual follow up

## 2024-01-11 NOTE — Assessment & Plan Note (Addendum)
 She has lost 62 lbs since 2020 .  She is currently taking Mounjaro  12.5 mg weekly  in March after maxing out ozempic  and experiencing a weight plateau,  since the change has lost 9 lbs . BMI remins > 35.  Discussed an  increase  in dose to 15 mg , but she prefers to wait.  Statin advised   Lab Results  Component Value Date   HGBA1C 5.4 07/14/2023   Lab Results  Component Value Date   MICROALBUR <0.7 07/14/2023

## 2024-01-12 ENCOUNTER — Other Ambulatory Visit: Payer: Self-pay

## 2024-01-13 ENCOUNTER — Ambulatory Visit: Payer: Self-pay | Admitting: Internal Medicine

## 2024-01-13 DIAGNOSIS — Z87440 Personal history of urinary (tract) infections: Secondary | ICD-10-CM | POA: Insufficient documentation

## 2024-01-13 NOTE — Assessment & Plan Note (Addendum)
 10 yr risk is low  Based on prior labs.  Repeat labs reviewed; ;  advising trial of statin for primary prevention and goal LDL , 70 given concurrent type 2 DM   Lab Results  Component Value Date   CHOL 172 01/11/2024   HDL 45.10 01/11/2024   LDLCALC 102 (H) 01/11/2024   LDLDIRECT 102.0 01/11/2024   TRIG 124.0 01/11/2024   CHOLHDL 4 01/11/2024

## 2024-01-13 NOTE — Assessment & Plan Note (Signed)
 Advised to submit urine for UA/micr and culture at onset of next occurrence.  Sterile collection container given and future UA/culture ordered

## 2024-01-13 NOTE — Assessment & Plan Note (Signed)
 Resolved. All serologies were  negative for viral and autoimmune etiologies.SABRA MRCP was done to rule out retained stone from prior cholecystectomy   Lab Results  Component Value Date   ALT 15 01/11/2024   AST 13 01/11/2024   ALKPHOS 50 01/11/2024   BILITOT 0.8 01/11/2024

## 2024-01-19 ENCOUNTER — Ambulatory Visit: Payer: Self-pay

## 2024-01-19 ENCOUNTER — Ambulatory Visit: Admitting: Nurse Practitioner

## 2024-01-19 ENCOUNTER — Encounter: Payer: Self-pay | Admitting: Nurse Practitioner

## 2024-01-19 ENCOUNTER — Other Ambulatory Visit: Payer: Self-pay

## 2024-01-19 VITALS — BP 130/76 | HR 68 | Temp 98.2°F | Resp 20 | Ht 62.0 in | Wt 213.0 lb

## 2024-01-19 DIAGNOSIS — J069 Acute upper respiratory infection, unspecified: Secondary | ICD-10-CM

## 2024-01-19 DIAGNOSIS — J386 Stenosis of larynx: Secondary | ICD-10-CM | POA: Diagnosis not present

## 2024-01-19 DIAGNOSIS — J029 Acute pharyngitis, unspecified: Secondary | ICD-10-CM | POA: Diagnosis not present

## 2024-01-19 LAB — POCT RAPID STREP A (OFFICE): Rapid Strep A Screen: NEGATIVE

## 2024-01-19 MED ORDER — METHYLPREDNISOLONE 4 MG PO TBPK
ORAL_TABLET | ORAL | 0 refills | Status: DC
  Filled 2024-01-19: qty 21, 6d supply, fill #0

## 2024-01-19 NOTE — Telephone Encounter (Signed)
 FYI Only or Action Required?: Action required by provider: patient requesting medication for possible URI.  Patient was last seen in primary care on 01/11/2024 by Marylynn Verneita CROME, MD.  Called Nurse Triage reporting Shortness of Breath.  Symptoms began Wednesday.  Interventions attempted: Rest, hydration, or home remedies.  Symptoms are: unchanged.  Triage Disposition: See HCP Within 4 Hours (Or PCP Triage)-requesting medication-asking for a call back from office.  Patient/caregiver understands and will follow disposition?: No, wishes to speak with PCP  Copied from CRM #[id number]. Topic: Clinical - Red Word Triage >> Jan 19, 2024 12:03 PM Isabell A wrote: Red Word that prompted transfer to Nurse Triage: Patient is requesting a z-pak and prednisone  for a cold - experiencing sore throat, mild cough & today she's experiencing SOB with activity and talking. Reason for Disposition  [1] MILD difficulty breathing (e.g., minimal/no SOB at rest, SOB with walking, pulse < 100) AND [2] NEW-onset or WORSE than normal  Answer Assessment - Initial Assessment Questions 1. RESPIRATORY STATUS: Describe your breathing? (e.g., wheezing, shortness of breath, unable to speak, severe coughing)      Shortness of breath 2. ONSET: When did this breathing problem begin?      Started Wednesday morning 3. PATTERN Does the difficult breathing come and go, or has it been constant since it started?      Comes and goes 4. SEVERITY: How bad is your breathing? (e.g., mild, moderate, severe)      mild 5. RECURRENT SYMPTOM: Have you had difficulty breathing before? If Yes, ask: When was the last time? and What happened that time?      yes 6. CARDIAC HISTORY: Do you have any history of heart disease? (e.g., heart attack, angina, bypass surgery, angioplasty)      no 7. LUNG HISTORY: Do you have any history of lung disease?  (e.g., pulmonary embolus, asthma, emphysema)     asthma 8. CAUSE: What do you  think is causing the breathing problem?      Patient thinks she has a cold 9. OTHER SYMPTOMS: Do you have any other symptoms? (e.g., chest pain, cough, dizziness, fever, runny nose)     cough 10. O2 SATURATION MONITOR:  Do you use an oxygen saturation monitor (pulse oximeter) at home? If Yes, ask: What is your reading (oxygen level) today? What is your usual oxygen saturation reading? (e.g., 95%)       no 12. TRAVEL: Have you traveled out of the country in the last month? (e.g., travel history, exposures)       No  Patient called to pulmonary office with request for medication for possible URI. Patient is instructed that pulmonary office is closed-message can be sent to PCP office or Urgent care would be her other option for today. Patient is asking for message to be sent to PCP office to request medication. Patient is asking for a call back from PCP office.  Protocols used: Breathing Difficulty-A-AH

## 2024-01-19 NOTE — Telephone Encounter (Signed)
 Patient is scheduled at 4:00 PM virtually with Leron Glance, NP

## 2024-01-19 NOTE — Progress Notes (Signed)
 Leron Glance, NP-C Phone: [phone]  Lisa [name] is a 48 y.o. female who presents today for sore throat.   Discussed the use of AI scribe software for clinical note transcription with the patient, who gave verbal consent to proceed.  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  She has been experiencing a sore throat since Wednesday, followed by malaise and suspected fever on Thursday, leading her to leave work early. COVID and flu tests were negative. By Friday, she developed significant mucus in her trachea, difficulty clearing it, and pain upon clearing. She also experiences shortness of breath when talking and has used her albuterol  inhaler, which provided some relief.  Her history of subglottic stenosis and chronic bronchitis exacerbates with colds, leading to airway issues. Typically, she contacts her regular doctors for prednisone , but they were unavailable, prompting this visit. She has been taking Tylenol  around the clock for sore throat relief.  Additional symptoms include headaches, sinus pain, facial and dental pain, but no nasal congestion or discharge. No nausea, vomiting, or abdominal pain. She feels more tired than usual and has been using Flonase  and Trelegy inhaler regularly.  She recently returned from a trip to [place], where she was exposed to crowded environments, potentially contributing to her current symptoms. She notes that her bronchials feel 'raw,' and that this happens when she gets a cold.      Social History   Tobacco Use  Smoking Status Never  Smokeless Tobacco Never    Current Outpatient Medications on File Prior to Visit  Medication Sig Dispense Refill   albuterol  (VENTOLIN  HFA) 108 (90 Base) MCG/ACT inhaler Inhale 2 puffs into the lungs every 6 (six) hours as needed for wheezing or shortness of breath. 6.7 g 0    cetirizine (ZYRTEC) 10 MG tablet Take 10 mg by mouth daily.     cholecalciferol (VITAMIN D3) 25 MCG (1000 UNIT) tablet Take 1,000 Units by mouth daily.     etonogestrel  (NEXPLANON ) 68 MG IMPL implant 1 each by Subdermal route once.     fluticasone  (FLONASE ) 50 MCG/ACT nasal spray Place 2 sprays into both nostrils daily. 16 g 2   Fluticasone -Umeclidin-Vilant (TRELEGY ELLIPTA ) 100-62.5-25 MCG/ACT AEPB Inhale 1 puff into the lungs daily. 60 each 11   folic acid  (FOLVITE ) 1 MG tablet Take 1 tablet (1 mg total) by mouth daily. 90 tablet 1   Magnesium 100 MG TABS Take 1 tablet by mouth daily.     omeprazole  (PRILOSEC) 40 MG capsule Take 1 capsule (40 mg total) by mouth daily. 90 capsule 1   scopolamine  (TRANSDERM-SCOP) 1 MG/3DAYS Place 1 patch (1.5 mg total) onto the skin every 3 (three) days. (Patient not taking: Reported on 02/01/2024) 4 patch 0   spironolactone  (ALDACTONE ) 25 MG tablet Take 1 tablet (25 mg total) by mouth daily. 90 tablet 3   tirzepatide  (MOUNJARO ) 12.5 MG/0.5ML Pen Inject 12.5 mg into the skin once a week. 6 mL 2   Turmeric 400 MG CAPS Take 400 mg by mouth daily.      vitamin B-12 (CYANOCOBALAMIN ) 100 MCG tablet Take 100 mcg by mouth daily.     No current facility-administered medications on file prior to visit.     ROS see history of present illness  Objective  Physical Exam Vitals:   01/19/24 1601  BP: 130/76  Pulse: 68  Resp: 20  Temp:  98.2 F (36.8 C)  SpO2: 99%    BP Readings from Last 3 Encounters:  02/01/24 128/88  01/25/24 130/86  01/19/24 130/76   Wt Readings from Last 3 Encounters:  02/01/24 213 lb (96.6 kg)  01/25/24 211 lb (95.7 kg)  01/19/24 213 lb (96.6 kg)    Physical Exam Vitals reviewed.  Constitutional:      General: She is not in acute distress.    Appearance: Normal appearance.  HENT:     Head: Normocephalic.     Right Ear: Tympanic membrane normal.     Left Ear: Tympanic membrane normal.     Nose: Nose normal.      Mouth/Throat:     Mouth: Mucous membranes are moist.     Pharynx: Oropharynx is clear.  Eyes:     Conjunctiva/sclera: Conjunctivae normal.     Pupils: Pupils are equal, round, and reactive to light.  Cardiovascular:     Rate and Rhythm: Normal rate and regular rhythm.     Heart sounds: Normal heart sounds.  Pulmonary:     Effort: Pulmonary effort is normal.     Breath sounds: Normal breath sounds.  Lymphadenopathy:     Cervical: No cervical adenopathy.  Skin:    General: Skin is warm and dry.  Neurological:     General: No focal deficit present.     Mental Status: She is alert.  Psychiatric:        Mood and Affect: Mood normal.        Behavior: Behavior normal.      Assessment/Plan: Please see individual problem list.  Upper respiratory tract infection, unspecified type Assessment & Plan: The acute upper respiratory infection is likely viral, worsened by chronic bronchitis and subglottic stenosis. COVID, flu, and strep tests are negative, with no bacterial infection present. Prescribe methylprednisolone  dose pack for inflammation control. Use albuterol  inhaler as needed for shortness of breath. Recommend warm fluids and lozenges for throat comfort. Seek medical attention for significant breathing difficulties or high fever.   Orders: -     methylPREDNISolone ; Take as directed.  Dispense: 21 each; Refill: 0  Subglottic stenosis Assessment & Plan: Subglottic stenosis contributes to airway sensitivity during infections, with no acute changes noted. Monitor for changes in breathing or symptoms of airway obstruction.    Sore throat -     POCT rapid strep A     Return if symptoms worsen or fail to improve.   Leron Glance, NP-C [facility]

## 2024-01-19 NOTE — Telephone Encounter (Signed)
 Has virtual visit.   FYI Only or Action Required?: FYI only for provider.  Patient was last seen in primary care on 01/11/2024 by Marylynn Verneita CROME, MD.  Called Nurse Triage reporting Sore Throat.  Symptoms began yesterday.  Interventions attempted: Nothing.  Symptoms are: gradually worsening.  Triage Disposition: See Today in Office (overriding Home Care) - via virtual  Patient/caregiver understands and will follow disposition?:  Reason for Disposition  [1] Sore throat is the only symptom AND [2] sore throat present < 48 hours  Answer Assessment - Initial Assessment Questions 1. ONSET: When did the throat start hurting? (Hours or days ago)      3 days  2. SEVERITY: How bad is the sore throat? (Scale 1-10; mild, moderate or severe)     4/10  3. STREP EXPOSURE: Has there been any exposure to strep within the past week? If Yes, ask: What type of contact occurred?      Denies  4.  VIRAL SYMPTOMS: Are there any symptoms of a cold, such as a runny nose, cough, hoarse voice or red eyes?      Mild cough  5. FEVER: Do you have a fever? If Yes, ask: What is your temperature, how was it measured, and when did it start?     Yesterday felt feverish  6. PUS ON THE TONSILS: Is there pus on the tonsils in the back of your throat?     Denies  Protocols used: Sore Throat-A-AH Copied from CRM [id number]. Topic: Clinical - Medical Advice >> Jan 19, 2024 12:16 PM Robinson H wrote: Reason for CRM: Having sore throat, having upper airway rawness, trouble clearing throat, no other pain, no shortness of breath or fever. States she's had something similar before and wants to know if prednisone  or zpack can be called in to the [facility] pharmacy on file.  [place] [phone]

## 2024-01-25 ENCOUNTER — Encounter: Payer: Self-pay | Admitting: Pulmonary Disease

## 2024-01-25 ENCOUNTER — Ambulatory Visit: Admitting: Pulmonary Disease

## 2024-01-25 ENCOUNTER — Other Ambulatory Visit: Payer: Self-pay

## 2024-01-25 ENCOUNTER — Ambulatory Visit
Admission: RE | Admit: 2024-01-25 | Discharge: 2024-01-25 | Disposition: A | Discharge status: Home / Self Care | Attending: Pulmonary Disease | Admitting: Pulmonary Disease

## 2024-01-25 ENCOUNTER — Other Ambulatory Visit: Payer: Self-pay | Admitting: Pulmonary Disease

## 2024-01-25 ENCOUNTER — Ambulatory Visit
Admission: RE | Admit: 2024-01-25 | Discharge: 2024-01-25 | Disposition: A | Discharge status: Home / Self Care | Source: Ambulatory Visit | Attending: Pulmonary Disease | Admitting: Pulmonary Disease

## 2024-01-25 VITALS — BP 130/86 | HR 82 | Temp 97.6°F | Ht 62.0 in | Wt 211.0 lb

## 2024-01-25 DIAGNOSIS — R0602 Shortness of breath: Secondary | ICD-10-CM | POA: Insufficient documentation

## 2024-01-25 DIAGNOSIS — J209 Acute bronchitis, unspecified: Secondary | ICD-10-CM

## 2024-01-25 DIAGNOSIS — J4531 Mild persistent asthma with (acute) exacerbation: Secondary | ICD-10-CM

## 2024-01-25 LAB — POC COVID19 BINAXNOW: SARS Coronavirus 2 Ag: NEGATIVE

## 2024-01-25 MED ORDER — AZITHROMYCIN 250 MG PO TABS
ORAL_TABLET | ORAL | 0 refills | Status: AC
  Filled 2024-01-25: qty 6, 5d supply, fill #0

## 2024-01-25 MED ORDER — COMPRESSOR/NEBULIZER MISC
0 refills | Status: AC
  Filled 2024-01-25: qty 1, 1d supply, fill #0

## 2024-01-25 MED ORDER — ALBUTEROL SULFATE (2.5 MG/3ML) 0.083% IN NEBU
2.5000 mg | INHALATION_SOLUTION | Freq: Four times a day (QID) | RESPIRATORY_TRACT | 2 refills | Status: AC | PRN
  Filled 2024-01-25: qty 75, 7d supply, fill #0

## 2024-01-25 MED ORDER — ALBUTEROL SULFATE (2.5 MG/3ML) 0.083% IN NEBU
2.5000 mg | INHALATION_SOLUTION | Freq: Once | RESPIRATORY_TRACT | Status: AC
  Administered 2024-01-25: 2.5 mg via RESPIRATORY_TRACT

## 2024-01-25 NOTE — Patient Instructions (Signed)
 VISIT SUMMARY:  You came in today due to worsening symptoms of your asthma and bronchitis. You initially felt better after starting prednisone  but experienced a setback. A chest x-ray was performed, and you received a nebulization treatment with albuterol , which improved your symptoms.  YOUR PLAN:  -ASTHMA WITH ACUTE ASTHMATIC BRONCHITIS EXACERBATION: This means your asthma symptoms have worsened due to bronchitis, which may have started as a viral infection and could now be bacterial. You will continue using the prednisone  and start taking azithromycin  (Z-Pak) to treat any potential bacterial infection. You will also use a nebulizer machine with albuterol  as needed to help with your breathing.  INSTRUCTIONS:  Please follow the prescribed medication regimen: continue with prednisone  as directed, start the azithromycin  (Z-Pak) as prescribed, and use the nebulizer with albuterol  as needed. If your symptoms do not improve or worsen, please contact our office. Make sure to rest and stay hydrated.  We have provided you with the letter to keep you off work for 3 days.  Lease follow-up next week as scheduled.  Please call back should symptoms worsen or fail to improve.

## 2024-01-25 NOTE — Progress Notes (Signed)
 Patient with symptoms of acute bronchitis.  Will obtain chest x-ray.  KYM Leita Sanders, MD Advanced Bronchoscopy PCCM [facility]

## 2024-01-25 NOTE — Progress Notes (Signed)
 Subjective:    Patient ID: Lisa [name], female    DOB: 08-Jan-1976, 48 y.o.   MRN: [id number]  Patient Care Team: Marylynn Verneita CROME, MD as PCP - General (Internal Medicine) Tamea Dedra CROME, MD as Consulting Physician (Pulmonary Disease)  Chief Complaint  Patient presents with   Asthma    Shortness of breath on exertion and at rest. Cough. No wheezing.     BACKGROUND/INTERVAL: Lisa [name] is a 48 year old lifelong never smoker with a prior history of subglottic stenosis status tracheal dilation in 2014 and redo dilation in 2021 by Dr. Vaughan Ricker.  She presents for an ACUTE VISIT due to increasing shortness of breath cough and hoarseness.  She was last seen here on 15 December 2023.  HPI Discussed the use of AI scribe software for clinical note transcription with the patient, who gave verbal consent to proceed.  History of Present Illness   Lisa [name] is a 48 year old female with asthma who presents with exacerbation of asthmatic bronchitis.  She began experiencing throat congestion on Friday, suspecting an impending illness. Unable to see her regular provider, she consulted a nurse practitioner, informed of her history of bronchitis. She typically receives a methyl prednisone  pack for such episodes and obtained one on Friday.  She chose not to start the medication on Friday night due to feeling unwell and wanting to sleep. By Saturday and Sunday, she felt much better and decided to keep the medication in reserve. However, on Monday morning, she experienced shortness of breath upon arriving at work, accompanied by chest discomfort. By the end of the day, she was very short of breath and had tingling in her hand.  She consulted with a colleague, who advised starting the prednisone . She had not brought it with her as she felt better in the morning. She began the prednisone  on Tuesday, felt better by Tuesday afternoon, and continued to feel good on Wednesday. However, she felt  unwell again on Thursday morning.  She picked up a Z-Pak and was observed by nurses who noted she did not look well. An x-ray was performed earlier today. She reported feeling worse and experiencing pain across her chest. She confirmed using her Trelegy inhaler and had taken her third dose of prednisone  that morning.  She just started coughing the previous night, with no significant sputum production.  No hemoptysis.     No fevers or chills of note.  Review of Systems A 10 point review of systems was performed and it is as noted above otherwise negative.   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   Mild persistent asthma 04/04/2022   GERD (gastroesophageal reflux disease) 04/04/2022   Complication of anesthesia 03/08/2022   Family history of colon cancer requiring screening colonoscopy 02/24/2022   Trigger point of left shoulder region 10/06/2021   Weakness of left arm 03/03/2020   Spondylosis, cervical, 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   Loud snoring 08/01/2019   B12 deficiency 08/01/2019   Hyperlipidemia associated with type 2 diabetes mellitus (HCC) 07/31/2019   Nonallopathic lesion of cervical region 04/29/2019   Nonallopathic lesion of thoracic region 04/29/2019   Nonallopathic lesion of rib cage 04/29/2019   Cervical radiculopathy at C7 03/26/2019  Breast tenderness in female 01/24/2019   Allergic rhinitis due to pollen 08/18/2018   Type 2 diabetes mellitus with morbid obesity (HCC) 05/02/2017   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   Encounter for screening colonoscopy 04/29/2016   Low back pain without sciatica 04/28/2015   Morbid obesity (HCC) 04/28/2015    Social History   Tobacco  Use   Smoking status: Never   Smokeless tobacco: Never  Substance Use Topics   Alcohol use: Yes    Alcohol/week: 2.0 standard drinks of alcohol    Types: 1 Glasses of wine, 1 Cans of beer per week    Comment: social    Allergies  Allergen Reactions   Chlorhexidine  Gluconate     Developed pruritic rash on abdomen from prep for lap chole Mar 09 2022   Latex Hives    Rash and hives    Current Meds  Medication Sig   albuterol  (PROVENTIL ) (2.5 MG/3ML) 0.083% nebulizer solution Take 3 mLs (2.5 mg total) by nebulization every 6 (six) hours as needed for wheezing or shortness of breath.   albuterol  (VENTOLIN  HFA) 108 (90 Base) MCG/ACT inhaler Inhale 2 puffs into the lungs every 6 (six) hours as needed for wheezing or shortness of breath.   azithromycin  (ZITHROMAX ) 250 MG tablet Take 2 tablets (500 mg) on  Day 1,  followed by 1 tablet (250 mg) once daily on Days 2 through 5.   cetirizine (ZYRTEC) 10 MG tablet Take 10 mg by mouth daily.   cholecalciferol (VITAMIN D3) 25 MCG (1000 UNIT) tablet Take 1,000 Units by mouth daily.   etonogestrel  (NEXPLANON ) 68 MG IMPL implant 1 each by Subdermal route once.   fluticasone  (FLONASE ) 50 MCG/ACT nasal spray Place 2 sprays into both nostrils daily.   Fluticasone -Umeclidin-Vilant (TRELEGY ELLIPTA ) 100-62.5-25 MCG/ACT AEPB Inhale 1 puff into the lungs daily.   folic acid  (FOLVITE ) 1 MG tablet Take 1 tablet (1 mg total) by mouth daily.   Magnesium 100 MG TABS Take 1 tablet by mouth daily.   methylPREDNISolone  (MEDROL  DOSEPAK) 4 MG TBPK tablet Take as directed.   Nebulizers (COMPRESSOR/NEBULIZER) MISC Use as needed for bronchodilator administration   omeprazole  (PRILOSEC) 40 MG capsule Take 1 capsule (40 mg total) by mouth daily.   spironolactone  (ALDACTONE ) 25 MG tablet Take 1 tablet (25 mg total) by mouth daily.   tirzepatide  (MOUNJARO ) 12.5 MG/0.5ML Pen Inject 12.5 mg into the skin once a week.   Turmeric 400 MG CAPS Take 400 mg by mouth daily.     vitamin B-12 (CYANOCOBALAMIN ) 100 MCG tablet Take 100 mcg by mouth daily.    Immunization History  Administered Date(s) Administered   Influenza Split 02/25/2015, 01/30/2017   Influenza-Unspecified 02/09/2016, 01/21/2018, 02/12/2019, 03/13/2020, 02/10/2021, 02/10/2022, 02/07/2023   Moderna Sars-Covid-2 Vaccination 05/06/2019, 05/27/2019, 05/12/2020   PFIZER(Purple Top)SARS-COV-2 Vaccination 05/07/2019, 05/28/2019, 05/13/2020   PNEUMOCOCCAL CONJUGATE-20 01/07/2021   Tdap 04/27/2016        Objective:     BP 130/86   Pulse 82   Temp 97.6 F (36.4 C) (Temporal)   Ht 5' 2 (1.575 m)   Wt 211 lb (95.7 kg)   SpO2 98%   BMI 38.59 kg/m   SpO2: 98 %  GENERAL: Obese woman, no acute distress, fully ambulatory. No conversational dyspnea.  Speaks with hoarse voice/whisper. HEAD: Normocephalic, atraumatic.  EYES: Pupils equal, round, reactive to light. No scleral icterus.  MOUTH: Pharynx clear, dentition intact. NECK: Supple. No thyromegaly. Trachea midline. No JVD. No adenopathy. No stridor. PULMONARY: Good air entry bilaterally.  Rare end expiratory wheeze. CARDIOVASCULAR: S1 and S2. Regular rate and rhythm.  No rubs, murmurs or gallops heard. ABDOMEN: Obese, otherwise benign. MUSCULOSKELETAL: No joint deformity, no clubbing, no edema.  NEUROLOGIC: No overt focal deficit, no gait disturbance, speech is fluent. SKIN: Intact,warm,dry. PSYCH: Mood and behavior normal.   POC COVID 19: NEGATIVE  Patient received nebulizer treatment with albuterol  2.5 mg via neb: Patient noted improvement in symptoms of dyspnea and cough.  Bronchospasm relieved.    Chest x-ray performed today does not show infiltrates or acute process, independently reviewed:   Assessment & Plan:     ICD-10-CM   1. Mild persistent asthmatic bronchitis with acute exacerbation  J45.31     2. Shortness of breath  R06.02 POC COVID-19    albuterol  (PROVENTIL ) (2.5 MG/3ML) 0.083% nebulizer solution 2.5 mg    3. Acute  bronchitis, unspecified organism  J20.9       Orders Placed This Encounter  Procedures   POC COVID-19    Meds ordered this encounter  Medications   albuterol  (PROVENTIL ) (2.5 MG/3ML) 0.083% nebulizer solution 2.5 mg   Nebulizers (COMPRESSOR/NEBULIZER) MISC    Sig: Use as needed for bronchodilator administration    Dispense:  1 each    Refill:  0   albuterol  (PROVENTIL ) (2.5 MG/3ML) 0.083% nebulizer solution    Sig: Take 3 mLs (2.5 mg total) by nebulization every 6 (six) hours as needed for wheezing or shortness of breath.    Dispense:  75 mL    Refill:  2  *Patient was written for Azithromycin  this morning.  Prescription is sent to pharmacy.  Discussion:    Asthma with acute asthmatic bronchitis exacerbation Exacerbation likely initiated by a viral illness, now possibly bacterial bronchitis. Symptoms include dyspnea, chest discomfort, and paresthesia in the hand. Chest x-ray showed no acute infiltrates. Improvement with nebulization treatment using albuterol , with clear lung sounds post-treatment. - Administer nebulization treatment with albuterol . - Prescribe azithromycin  (Z-Pak) for potential bacterial bronchitis, prescribed earlier. - Send prescription for a nebulizer machine and albuterol  for as-needed use.     Advised if symptoms do not improve or worsen, to please contact office for sooner follow up or seek emergency care.    I spent 42 minutes of dedicated to the care of this patient on the date of this encounter to include pre-visit review of records, face-to-face time with the patient discussing conditions above, post visit ordering of testing, clinical documentation with the electronic health record, making appropriate referrals as documented, and communicating necessary findings to members of the patients care team.     C. Leita Sanders, MD Advanced Bronchoscopy PCCM [facility]    *This note was generated using voice recognition software/Dragon  and/or AI transcription program.  Despite best efforts to proofread, errors can occur which can change the meaning. Any transcriptional errors that result from this process are unintentional and may not be fully corrected at the time of dictation.

## 2024-01-25 NOTE — Telephone Encounter (Signed)
 Azithromycin  sent to [facility] pharmacy.

## 2024-01-25 NOTE — Telephone Encounter (Signed)
 We can send in a prescription for azithromycin  (Z-Pak) and see if we can see her next week sometime.

## 2024-01-26 NOTE — Telephone Encounter (Signed)
 It may be related to the steroids.  She can hold off on the Medrol  dose pack and see if it resolves.

## 2024-01-29 NOTE — Telephone Encounter (Signed)
 The chest pain is certainly more concerning.  Would recommend that she be seen in the emergency room as she may need CT angio.

## 2024-02-01 ENCOUNTER — Other Ambulatory Visit: Payer: Self-pay

## 2024-02-01 ENCOUNTER — Other Ambulatory Visit
Admission: RE | Admit: 2024-02-01 | Discharge: 2024-02-01 | Disposition: A | Discharge status: Home / Self Care | Source: Ambulatory Visit | Attending: Pulmonary Disease | Admitting: Pulmonary Disease

## 2024-02-01 ENCOUNTER — Ambulatory Visit: Admitting: Pulmonary Disease

## 2024-02-01 ENCOUNTER — Encounter: Payer: Self-pay | Admitting: Pulmonary Disease

## 2024-02-01 ENCOUNTER — Ambulatory Visit: Payer: Self-pay | Admitting: Pulmonary Disease

## 2024-02-01 VITALS — BP 128/88 | HR 70 | Temp 97.7°F | Ht 62.0 in | Wt 213.0 lb

## 2024-02-01 DIAGNOSIS — R079 Chest pain, unspecified: Secondary | ICD-10-CM

## 2024-02-01 DIAGNOSIS — R0602 Shortness of breath: Secondary | ICD-10-CM | POA: Diagnosis not present

## 2024-02-01 DIAGNOSIS — M940 Chondrocostal junction syndrome [Tietze]: Secondary | ICD-10-CM | POA: Diagnosis not present

## 2024-02-01 DIAGNOSIS — G9331 Postviral fatigue syndrome: Secondary | ICD-10-CM

## 2024-02-01 DIAGNOSIS — K219 Gastro-esophageal reflux disease without esophagitis: Secondary | ICD-10-CM

## 2024-02-01 DIAGNOSIS — Z8619 Personal history of other infectious and parasitic diseases: Secondary | ICD-10-CM | POA: Diagnosis not present

## 2024-02-01 DIAGNOSIS — J069 Acute upper respiratory infection, unspecified: Secondary | ICD-10-CM

## 2024-02-01 DIAGNOSIS — J4531 Mild persistent asthma with (acute) exacerbation: Secondary | ICD-10-CM

## 2024-02-01 LAB — D-DIMER, QUANTITATIVE: D-Dimer, Quant: 0.3 ug{FEU}/mL (ref 0.00–0.50)

## 2024-02-01 MED ORDER — CELECOXIB 100 MG PO CAPS
100.0000 mg | ORAL_CAPSULE | Freq: Two times a day (BID) | ORAL | 0 refills | Status: DC
  Filled 2024-02-01: qty 30, 15d supply, fill #0

## 2024-02-01 NOTE — Patient Instructions (Signed)
 VISIT SUMMARY:  Today, you were seen for post-viral symptoms including chest pain and shortness of breath. You also reported rib pain, shortness of breath with activity, and tingling in your face and fingers. We discussed your ongoing management for gastroesophageal reflux disease (GERD).  YOUR PLAN:  -POST-VIRAL FATIGUE SYNDROME: Post-viral fatigue syndrome is a condition that can occur after a viral infection, causing lingering symptoms like fatigue and shortness of breath. We will order a D-dimer test to check for any blood clotting issues. If the D-dimer test is elevated, we will proceed with a CT angiography of your chest to get a detailed look at your blood vessels.  -COSTOCHONDRITIS: Costochondritis is inflammation of the cartilage that connects a rib to the breastbone, often causing chest pain. You will start taking Celebrex  100 mg twice a day to help manage the pain. Continue using ibuprofen  and a heating pad as needed.  -GASTROESOPHAGEAL REFLUX DISEASE (GERD): GERD is a digestive disorder where stomach acid irritates the food pipe lining. Continue taking Prilosec daily as you have been doing to manage your symptoms.  INSTRUCTIONS:  Please go to the lab for a D-dimer test. If the results are elevated, we will schedule a CT angiography of your chest. Continue taking Celebrex  100 mg twice a day for costochondritis and Prilosec daily for GERD. Follow up with us  if your symptoms worsen or if you have any new concerns.

## 2024-02-01 NOTE — Progress Notes (Signed)
 Subjective:    Patient ID: Lisa [name], female    DOB: 17-Nov-1975, 48 y.o.   MRN: [id number]  Patient Care Team: Marylynn Verneita CROME, MD as PCP - General (Internal Medicine) Tamea Dedra CROME, MD as Consulting Physician (Pulmonary Disease)  Chief Complaint  Patient presents with   Asthma    Chest pain. Occasional cough.     BACKGROUND/INTERVAL:Paislea is a 48 year old lifelong never smoker with a prior history of subglottic stenosis status tracheal dilation in 2014 and redo dilation in 2021 by Dr. Vaughan Ricker.  She presents as a follow-up to an acute visit from 25 January 2024.  She has had increasing shortness of breath cough and hoarseness.    HPI Discussed the use of AI scribe software for clinical note transcription with the patient, who gave verbal consent to proceed.  History of Present Illness   Lisa [name] is a 48 year old female who presents with post-viral symptoms including chest pain and shortness of breath.  She experiences intermittent chest pain that radiates towards her shoulder and rib area. The rib pain has improved over time. She manages the pain with ibuprofen  and a heating pad, which provides some relief.  She feels short of breath with any physical activity, a persistent issue since her recent viral infection. The shortness of breath is accompanied by a sensation of panic, although she tries to control her breathing to avoid hyperventilation.  She experienced tingling in her face and fingers on one side, which improved after she sat down and relaxed.  She continues to take Prilosec daily for her gastroesophageal reflux disease (GERD).  No recent visits to the emergency department. No current fever or other acute symptoms.     Review of Systems A 10 point review of systems was performed and it is as noted above otherwise negative.   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   Mild persistent asthma 04/04/2022   GERD (gastroesophageal reflux disease) 04/04/2022   Complication of anesthesia 03/08/2022   Family history of colon cancer requiring screening colonoscopy 02/24/2022   Trigger point of left shoulder region 10/06/2021   Weakness of left arm 03/03/2020   Spondylosis, cervical, 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   Upper respiratory tract infection 12/04/2019   Loud snoring 08/01/2019   B12 deficiency 08/01/2019   Hyperlipidemia associated with type 2 diabetes mellitus (HCC) 07/31/2019   Nonallopathic lesion of cervical region 04/29/2019   Nonallopathic lesion of thoracic region 04/29/2019   Nonallopathic lesion of rib cage 04/29/2019   Cervical radiculopathy at C7 03/26/2019   Breast tenderness in female 01/24/2019   Allergic rhinitis due to pollen 08/18/2018   Type 2 diabetes mellitus with morbid obesity (HCC) 05/02/2017   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   Encounter for screening colonoscopy 04/29/2016   Low back pain without sciatica 04/28/2015   Morbid obesity (HCC) 04/28/2015    Social History   Tobacco Use   Smoking status: Never   Smokeless tobacco: Never  Substance Use Topics   Alcohol use: Yes    Alcohol/week: 2.0 standard drinks of alcohol    Types: 1 Glasses of wine, 1 Cans of beer per week    Comment: social  Allergies  Allergen Reactions   Chlorhexidine  Gluconate     Developed pruritic rash on abdomen from prep for lap chole Mar 09 2022   Latex Hives    Rash and hives    Current Meds  Medication Sig   albuterol  (PROVENTIL ) (2.5 MG/3ML) 0.083% nebulizer solution Take 3 mLs (2.5 mg total) by nebulization every 6 (six) hours as needed for wheezing or shortness of breath.    albuterol  (VENTOLIN  HFA) 108 (90 Base) MCG/ACT inhaler Inhale 2 puffs into the lungs every 6 (six) hours as needed for wheezing or shortness of breath.   celecoxib  (CELEBREX ) 100 MG capsule Take 1 capsule (100 mg total) by mouth 2 (two) times daily. Take with food   cetirizine (ZYRTEC) 10 MG tablet Take 10 mg by mouth daily.   cholecalciferol (VITAMIN D3) 25 MCG (1000 UNIT) tablet Take 1,000 Units by mouth daily.   etonogestrel  (NEXPLANON ) 68 MG IMPL implant 1 each by Subdermal route once.   fluticasone  (FLONASE ) 50 MCG/ACT nasal spray Place 2 sprays into both nostrils daily.   Fluticasone -Umeclidin-Vilant (TRELEGY ELLIPTA ) 100-62.5-25 MCG/ACT AEPB Inhale 1 puff into the lungs daily.   folic acid  (FOLVITE ) 1 MG tablet Take 1 tablet (1 mg total) by mouth daily.   Magnesium 100 MG TABS Take 1 tablet by mouth daily.   Nebulizers (COMPRESSOR/NEBULIZER) MISC Use as needed for bronchodilator administration   omeprazole  (PRILOSEC) 40 MG capsule Take 1 capsule (40 mg total) by mouth daily.   spironolactone  (ALDACTONE ) 25 MG tablet Take 1 tablet (25 mg total) by mouth daily.   tirzepatide  (MOUNJARO ) 12.5 MG/0.5ML Pen Inject 12.5 mg into the skin once a week.   Turmeric 400 MG CAPS Take 400 mg by mouth daily.    vitamin B-12 (CYANOCOBALAMIN ) 100 MCG tablet Take 100 mcg by mouth daily.   [DISCONTINUED] methylPREDNISolone  (MEDROL  DOSEPAK) 4 MG TBPK tablet Take as directed.    Immunization History  Administered Date(s) Administered   Influenza Split 02/25/2015, 01/30/2017   Influenza-Unspecified 02/09/2016, 01/21/2018, 02/12/2019, 03/13/2020, 02/10/2021, 02/10/2022, 02/07/2023   Moderna Sars-Covid-2 Vaccination 05/06/2019, 05/27/2019, 05/12/2020   PFIZER(Purple Top)SARS-COV-2 Vaccination 05/07/2019, 05/28/2019, 05/13/2020   PNEUMOCOCCAL CONJUGATE-20 01/07/2021   Tdap 04/27/2016        Objective:     BP 128/88   Pulse 70   Temp 97.7 F (36.5 C) (Temporal)   Ht 5' 2 (1.575 m)   Wt 213 lb  (96.6 kg)   SpO2 100%   BMI 38.96 kg/m   SpO2: 100 %  GENERAL: Obese woman, no acute distress, fully ambulatory. No conversational dyspnea.  Speaks with hoarse voice/whisper. HEAD: Normocephalic, atraumatic.  EYES: Pupils equal, round, reactive to light. No scleral icterus.  MOUTH: Pharynx clear, dentition intact. NECK: Supple. No thyromegaly. Trachea midline. No JVD. No adenopathy. No stridor. PULMONARY: Good air entry bilaterally.  No adventitious sounds. CHEST: Palpation along costochondral junction on the left reproduces chest pain. CARDIOVASCULAR: S1 and S2. Regular rate and rhythm.  No rubs, murmurs or gallops heard. ABDOMEN: Obese, otherwise benign. MUSCULOSKELETAL: No joint deformity, no clubbing, no edema.  NEUROLOGIC: No overt focal deficit, no gait disturbance, speech is fluent. SKIN: Intact,warm,dry. PSYCH: Mood and behavior normal. :        Assessment & Plan:     ICD-10-CM   1. Mild persistent asthmatic bronchitis with acute exacerbation  J45.31     2. Shortness of breath  R06.02 D-Dimer, Quantitative    3. Costochondritis, acute  M94.0     4. Chest pain, unspecified  type  R07.9 D-Dimer, Quantitative      Orders Placed This Encounter  Procedures   D-Dimer, Quantitative    Standing Status:   Future    Number of Occurrences:   1    Expiration Date:   01/31/2025    Meds ordered this encounter  Medications   celecoxib  (CELEBREX ) 100 MG capsule    Sig: Take 1 capsule (100 mg total) by mouth 2 (two) times daily. Take with food    Dispense:  30 capsule    Refill:  0   Discussion:    Post-viral fatigue syndrome Experiencing post-viral fatigue syndrome following a recent viral infection. Reports shortness of breath with activity and tingling in face and fingers, which improved with rest. Symptoms consistent with post-viral fatigue, a condition that can linger after viral infections and may require pulmonary rehabilitation in some cases. - Order D-dimer  test - If D-dimer is elevated, order CT angiography of the chest  Costochondritis Reports intermittent rib pain likely due to costochondritis, possibly developed from the recent viral infection. Pain has improved but is still present. Managing pain with ibuprofen  and a heating pad, which provides some relief. - Prescribe Celebrex  100 mg BID for costochondritis management - Discontinue ibuprofen  while on Celebrex   Gastroesophageal reflux disease (GERD) Continues to take Prilosec daily for GERD management. - Continue Prilosec for GERD management      Advised if symptoms do not improve or worsen, to please contact office for sooner follow up or seek emergency care.    I spent 30 minutes of dedicated to the care of this patient on the date of this encounter to include pre-visit review of records, face-to-face time with the patient discussing conditions above, post visit ordering of testing, clinical documentation with the electronic health record, making appropriate referrals as documented, and communicating necessary findings to members of the patients care team.   Addendum: D-dimer reported as 0.30 mcg/mL-FEU, this is negative.  KYM Leita Sanders, MD Advanced Bronchoscopy P[facility]    *This note was generated using voice recognition software/Dragon and/or AI transcription program.  Despite best efforts to proofread, errors can occur which can change the meaning. Any transcriptional errors that result from this process are unintentional and may not be fully corrected at the time of dictation.

## 2024-02-02 ENCOUNTER — Other Ambulatory Visit: Payer: Self-pay

## 2024-02-02 DIAGNOSIS — J029 Acute pharyngitis, unspecified: Secondary | ICD-10-CM | POA: Insufficient documentation

## 2024-02-02 MED ORDER — METHYLPREDNISOLONE 4 MG PO TBPK
ORAL_TABLET | ORAL | 0 refills | Status: DC
  Filled 2024-02-02: qty 21, 6d supply, fill #0

## 2024-02-02 NOTE — Assessment & Plan Note (Signed)
 The acute upper respiratory infection is likely viral, worsened by chronic bronchitis and subglottic stenosis. COVID, flu, and strep tests are negative, with no bacterial infection present. Prescribe methylprednisolone  dose pack for inflammation control. Use albuterol  inhaler as needed for shortness of breath. Recommend warm fluids and lozenges for throat comfort. Seek medical attention for significant breathing difficulties or high fever.

## 2024-02-02 NOTE — Telephone Encounter (Signed)
 I can definitely send another Medrol  Dosepak but not sure that that is really going to do much with the fatigue.

## 2024-02-02 NOTE — Assessment & Plan Note (Deleted)
 The acute upper respiratory infection is likely viral, worsened by chronic bronchitis and subglottic stenosis. COVID, flu, and strep tests are negative, with no bacterial infection present. Prescribe methylprednisolone  dose pack for inflammation control. Use albuterol  inhaler as needed for shortness of breath. Recommend warm fluids and lozenges for throat comfort. Seek medical attention for significant breathing difficulties or high fever.

## 2024-02-02 NOTE — Assessment & Plan Note (Signed)
 Subglottic stenosis contributes to airway sensitivity during infections, with no acute changes noted. Monitor for changes in breathing or symptoms of airway obstruction.

## 2024-02-05 ENCOUNTER — Ambulatory Visit: Admitting: Family Medicine

## 2024-02-05 ENCOUNTER — Encounter: Payer: Self-pay | Admitting: Family Medicine

## 2024-02-06 ENCOUNTER — Ambulatory Visit: Admitting: Family Medicine

## 2024-02-08 ENCOUNTER — Other Ambulatory Visit: Payer: Self-pay

## 2024-02-09 ENCOUNTER — Encounter: Payer: Self-pay | Admitting: Internal Medicine

## 2024-02-10 ENCOUNTER — Encounter: Payer: Self-pay | Admitting: Internal Medicine

## 2024-02-20 ENCOUNTER — Ambulatory Visit: Admitting: Pulmonary Disease

## 2024-02-22 ENCOUNTER — Ambulatory Visit: Admitting: Internal Medicine

## 2024-02-28 NOTE — Progress Notes (Signed)
 [facility] [address] [place] [address] Phone: [phone] Subjective:   Lisa [name] am a scribe for Dr. Claudene.   I'm seeing this patient by the request  of:  Marylynn Verneita CROME, MD  CC: Back and neck pain follow up   YEP:Dlagzrupcz  Lisa [name] is a 48 y.o. female coming in with complaint of back and neck pain. OMT on 11/13/2023. Patient states back and neck are doing pretty good. Was sick with bronchitis and was diagnosed with costochondritises. So it is a bit sore but nothing terrible.            Reviewed prior external information including notes and imaging from previsou exam, outside providers and external EMR if available.   As well as notes that were available from care everywhere and other healthcare systems.  Past medical history, social, surgical and family history all reviewed in electronic medical record.  No pertanent information unless stated regarding to the chief complaint.   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 POSITIVE muscle aches  Objective  Blood pressure 120/70, pulse 80, height 5' 2 (1.575 m), weight 215 lb 12.8 oz (97.9 kg), SpO2 98%.   General: No apparent distress alert and oriented x3 mood and affect normal, dressed appropriately.  HEENT: Pupils equal, extraocular movements intact  Respiratory: Patient's speak in full sentences and does not appear short of breath  Cardiovascular: No lower extremity edema, non tender, no erythema  Gait MSK:  Back overall doing well, does have some tightness noted in the paraspinal muscles.  Some tenderness around the chest area as well.  Some increasing discomfort around the costal sternal area left greater than right.  No cortical defect noted.  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  We discussed with patient about treatment of this with colchicine.  Given a prescription if necessary.  Patient will see if she wants to fill it or just continue to give it time.  Discussed some mild home exercises, follow-up with me again in 6 to 8 weeks    Nonallopathic problems  Decision today to treat with OMT was based on Physical Exam  After verbal consent patient was treated with HVLA, ME, FPR techniques in cervical, rib, thoracic, lumbar, and sacral  areas  Patient tolerated the procedure well with improvement in symptoms  Patient given exercises, stretches and  lifestyle modifications  See medications in patient instructions if given  Patient will follow up in 4-8 weeks     The above documentation has been reviewed and is accurate and complete Llewellyn Choplin M Warda Mcqueary, DO         Note: This dictation was prepared with Dragon dictation along with smaller phrase technology. Any transcriptional errors that result from this process are unintentional.

## 2024-02-29 ENCOUNTER — Encounter

## 2024-03-01 ENCOUNTER — Encounter: Payer: Self-pay | Admitting: Family Medicine

## 2024-03-01 ENCOUNTER — Other Ambulatory Visit: Payer: Self-pay

## 2024-03-01 ENCOUNTER — Ambulatory Visit (INDEPENDENT_AMBULATORY_CARE_PROVIDER_SITE_OTHER): Admitting: Family Medicine

## 2024-03-01 VITALS — BP 120/70 | HR 80 | Ht 62.0 in | Wt 215.8 lb

## 2024-03-01 DIAGNOSIS — M9908 Segmental and somatic dysfunction of rib cage: Secondary | ICD-10-CM | POA: Diagnosis not present

## 2024-03-01 DIAGNOSIS — M9903 Segmental and somatic dysfunction of lumbar region: Secondary | ICD-10-CM | POA: Diagnosis not present

## 2024-03-01 DIAGNOSIS — M940 Chondrocostal junction syndrome [Tietze]: Secondary | ICD-10-CM | POA: Diagnosis not present

## 2024-03-01 DIAGNOSIS — M9901 Segmental and somatic dysfunction of cervical region: Secondary | ICD-10-CM

## 2024-03-01 DIAGNOSIS — M9904 Segmental and somatic dysfunction of sacral region: Secondary | ICD-10-CM

## 2024-03-01 DIAGNOSIS — E538 Deficiency of other specified B group vitamins: Secondary | ICD-10-CM

## 2024-03-01 DIAGNOSIS — M9902 Segmental and somatic dysfunction of thoracic region: Secondary | ICD-10-CM

## 2024-03-01 MED ORDER — COLCHICINE 0.6 MG PO TABS
0.6000 mg | ORAL_TABLET | Freq: Two times a day (BID) | ORAL | 0 refills | Status: AC
  Filled 2024-03-01: qty 10, 5d supply, fill #0

## 2024-03-01 NOTE — Assessment & Plan Note (Signed)
 Patient has had slipped rib syndrome and potentially has some costochondritis.  We discussed with patient about treatment of this with colchicine.  Given a prescription if necessary.  Patient will see if she wants to fill it or just continue to give it time.  Discussed some mild home exercises, follow-up with me again in 6 to 8 weeks

## 2024-03-01 NOTE — Patient Instructions (Addendum)
 Colchicine prescribed Keep doing what you're doing See you again in 2-3 months

## 2024-03-01 NOTE — Assessment & Plan Note (Signed)
 Continue supplementation  ?

## 2024-03-07 ENCOUNTER — Other Ambulatory Visit: Payer: Self-pay | Admitting: Internal Medicine

## 2024-03-07 ENCOUNTER — Other Ambulatory Visit: Payer: Self-pay

## 2024-03-08 ENCOUNTER — Other Ambulatory Visit: Payer: Self-pay

## 2024-03-08 MED ORDER — FLUTICASONE PROPIONATE 50 MCG/ACT NA SUSP
2.0000 | Freq: Every day | NASAL | 2 refills | Status: AC
  Filled 2024-03-08 – 2024-04-10 (×2): qty 16, 30d supply, fill #0

## 2024-03-17 ENCOUNTER — Other Ambulatory Visit: Payer: Self-pay | Admitting: Medical Genetics

## 2024-03-17 DIAGNOSIS — Z006 Encounter for examination for normal comparison and control in clinical research program: Secondary | ICD-10-CM

## 2024-03-19 ENCOUNTER — Ambulatory Visit: Admitting: Internal Medicine

## 2024-03-20 ENCOUNTER — Other Ambulatory Visit: Payer: Self-pay

## 2024-03-27 ENCOUNTER — Ambulatory Visit: Admitting: Internal Medicine

## 2024-03-27 ENCOUNTER — Other Ambulatory Visit: Payer: Self-pay

## 2024-03-27 VITALS — BP 106/72 | HR 82 | Ht 62.0 in | Wt 213.6 lb

## 2024-03-27 DIAGNOSIS — K2100 Gastro-esophageal reflux disease with esophagitis, without bleeding: Secondary | ICD-10-CM

## 2024-03-27 MED ORDER — PANTOPRAZOLE SODIUM 40 MG PO TBEC
40.0000 mg | DELAYED_RELEASE_TABLET | Freq: Two times a day (BID) | ORAL | 3 refills | Status: AC
  Filled 2024-03-27: qty 60, 30d supply, fill #0
  Filled 2024-04-21: qty 60, 30d supply, fill #1
  Filled 2024-06-09: qty 60, 30d supply, fill #2

## 2024-03-27 NOTE — Progress Notes (Unsigned)
 Subjective:  Patient ID: Lisa [name], female    DOB: [birth year]  Age: 48 y.o. MRN: [id number]  CC: There were no encounter diagnoses.   HPI Lisa [name] presents for  Chief Complaint  Patient presents with   Gastroesophageal Reflux    Occurred after having bronchitis in early September   48 yr old female with history of subglottic stenosis with prior dilations  (Last one 2021) recently treated for bronchitis in Sept with steroids , presents with persistent reflux symptoms despite use of omeprazole  correctly in the morning.  Symtpoms start after breakfast    Outpatient Medications Prior to Visit  Medication Sig Dispense Refill   albuterol  (PROVENTIL ) (2.5 MG/3ML) 0.083% nebulizer solution Take 3 mLs (2.5 mg total) by nebulization every 6 (six) hours as needed for wheezing or shortness of breath. 75 mL 2   albuterol  (VENTOLIN  HFA) 108 (90 Base) MCG/ACT inhaler Inhale 2 puffs into the lungs every 6 (six) hours as needed for wheezing or shortness of breath. 6.7 g 0   cetirizine (ZYRTEC) 10 MG tablet Take 10 mg by mouth daily.     cholecalciferol (VITAMIN D3) 25 MCG (1000 UNIT) tablet Take 1,000 Units by mouth daily.     colchicine 0.6 MG tablet Take 1 tablet (0.6 mg total) by mouth 2 (two) times daily. 10 tablet 0   etonogestrel  (NEXPLANON ) 68 MG IMPL implant 1 each by Subdermal route once.     fluticasone  (FLONASE ) 50 MCG/ACT nasal spray Place 2 sprays into both nostrils daily. 16 g 2   Fluticasone -Umeclidin-Vilant (TRELEGY ELLIPTA ) 100-62.5-25 MCG/ACT AEPB Inhale 1 puff into the lungs daily. 60 each 11   folic acid  (FOLVITE ) 1 MG tablet Take 1 tablet (1 mg total) by mouth daily. 90 tablet 1   Magnesium 100 MG TABS Take 1 tablet by mouth daily.     Nebulizers (COMPRESSOR/NEBULIZER) MISC Use as needed for bronchodilator administration 1 each 0   omeprazole  (PRILOSEC) 40 MG capsule Take 1 capsule (40 mg total) by mouth daily. 90 capsule 1   spironolactone  (ALDACTONE ) 25 MG  tablet Take 1 tablet (25 mg total) by mouth daily. 90 tablet 3   tirzepatide  (MOUNJARO ) 12.5 MG/0.5ML Pen Inject 12.5 mg into the skin once a week. 6 mL 2   Turmeric 400 MG CAPS Take 400 mg by mouth daily.      vitamin B-12 (CYANOCOBALAMIN ) 100 MCG tablet Take 100 mcg by mouth daily.     scopolamine  (TRANSDERM-SCOP) 1 MG/3DAYS Place 1 patch (1.5 mg total) onto the skin every 3 (three) days. (Patient not taking: Reported on 03/27/2024) 4 patch 0   celecoxib  (CELEBREX ) 100 MG capsule Take 1 capsule (100 mg total) by mouth 2 (two) times daily. Take with food (Patient not taking: Reported on 03/27/2024) 30 capsule 0   methylPREDNISolone  (MEDROL  DOSEPAK) 4 MG TBPK tablet Take as directed. 21 each 0   No facility-administered medications prior to visit.    Review of Systems;  Patient denies headache, fevers, malaise, unintentional weight loss, skin rash, eye pain, sinus congestion and sinus pain, sore throat, dysphagia,  hemoptysis , cough, dyspnea, wheezing, chest pain, palpitations, orthopnea, edema, abdominal pain, nausea, melena, diarrhea, constipation, flank pain, dysuria, hematuria, urinary  Frequency, nocturia, numbness, tingling, seizures,  Focal weakness, Loss of consciousness,  Tremor, insomnia, depression, anxiety, and suicidal ideation.      Objective:  BP 106/72   Pulse 82   Ht 5' 2 (1.575 m)   Wt 213 lb 9.6 oz (96.9 kg)  SpO2 97%   BMI 39.07 kg/m   BP Readings from Last 3 Encounters:  03/27/24 106/72  03/01/24 120/70  02/01/24 128/88    Wt Readings from Last 3 Encounters:  03/27/24 213 lb 9.6 oz (96.9 kg)  03/01/24 215 lb 12.8 oz (97.9 kg)  02/01/24 213 lb (96.6 kg)    Physical Exam  Lab Results  Component Value Date   HGBA1C 5.5 01/11/2024   HGBA1C 5.4 07/14/2023   HGBA1C 5.5 03/29/2023    Lab Results  Component Value Date   CREATININE 0.75 01/11/2024   CREATININE 0.69 07/14/2023   CREATININE 0.71 03/29/2023    Lab Results  Component Value Date   WBC  8.2 10/30/2023   HGB 14.7 10/30/2023   HCT 46.2 10/30/2023   PLT 304 10/30/2023   GLUCOSE 86 01/11/2024   CHOL 172 01/11/2024   TRIG 124.0 01/11/2024   HDL 45.10 01/11/2024   LDLDIRECT 102.0 01/11/2024   LDLCALC 102 (H) 01/11/2024   ALT 15 01/11/2024   AST 13 01/11/2024   NA 139 01/11/2024   K 4.2 01/11/2024   CL 106 01/11/2024   CREATININE 0.75 01/11/2024   BUN 12 01/11/2024   CO2 25 01/11/2024   TSH 1.33 07/14/2023   HGBA1C 5.5 01/11/2024   MICROALBUR <0.7 07/14/2023    No results found.  Assessment & Plan:  .There are no diagnoses linked to this encounter.   I spent 34 minutes on the day of this face to face encounter reviewing patient's  most recent visit with cardiology,  nephrology,  and neurology,  prior relevant surgical and non surgical procedures, recent  labs and imaging studies, counseling on weight management,  reviewing the assessment and plan with patient, and post visit ordering and reviewing of  diagnostics and therapeutics with patient  .   Follow-up: No follow-ups on file.   Lisa LITTIE Kettering, MD

## 2024-03-27 NOTE — Patient Instructions (Signed)
 Stop omeprazole  and start pantoprazole  2 TIMES DAILY    Cut out carbonated sodas, , chocolate, mint for a few days to see if this helps (see diet below)  You can use Gaviscon or maalox for immediate relief of symptoms  Referral to Dr Melany ([facility] ) in progress.  You Can cancel if symptoms become better controlled

## 2024-03-29 NOTE — Assessment & Plan Note (Addendum)
 Reviewed last chest x ray, no evidence of hiatal hernia.  Taking GLP 1 agonist which may be aggravating symptoms despite use of omeprazole  40 mg.  Changing to pantoprazole  40 mg bid.  Referring to GI for EGD if symptoms do not resolve

## 2024-04-01 ENCOUNTER — Encounter: Payer: Self-pay | Admitting: Family Medicine

## 2024-04-01 ENCOUNTER — Other Ambulatory Visit: Payer: Self-pay

## 2024-04-01 ENCOUNTER — Ambulatory Visit: Admitting: Family Medicine

## 2024-04-01 VITALS — BP 122/82 | HR 76 | Ht 62.0 in | Wt 215.0 lb

## 2024-04-01 DIAGNOSIS — M9903 Segmental and somatic dysfunction of lumbar region: Secondary | ICD-10-CM

## 2024-04-01 DIAGNOSIS — M9908 Segmental and somatic dysfunction of rib cage: Secondary | ICD-10-CM | POA: Diagnosis not present

## 2024-04-01 DIAGNOSIS — M9902 Segmental and somatic dysfunction of thoracic region: Secondary | ICD-10-CM | POA: Diagnosis not present

## 2024-04-01 DIAGNOSIS — M9904 Segmental and somatic dysfunction of sacral region: Secondary | ICD-10-CM | POA: Diagnosis not present

## 2024-04-01 DIAGNOSIS — M940 Chondrocostal junction syndrome [Tietze]: Secondary | ICD-10-CM | POA: Diagnosis not present

## 2024-04-01 DIAGNOSIS — M9901 Segmental and somatic dysfunction of cervical region: Secondary | ICD-10-CM | POA: Diagnosis not present

## 2024-04-01 MED ORDER — PREDNISONE 20 MG PO TABS
40.0000 mg | ORAL_TABLET | Freq: Every day | ORAL | 0 refills | Status: DC
  Filled 2024-04-01: qty 10, 5d supply, fill #0

## 2024-04-01 NOTE — Assessment & Plan Note (Signed)
 Chronic problem that may be more of a cervical radiculopathy then surely slipped rib.  We will continue to monitor.  Does respond well though to osteopathic manipulation.  We discussed different medicines such as prednisone  and given prescription for 40 mg for 5 days.  Increase activity slowly.  Follow-up again in 6 to 12 weeks.

## 2024-04-01 NOTE — Progress Notes (Signed)
 [facility] [address] [place] [address] Phone: [phone] Subjective:   LILLETTE Berwyn Posey, am serving as a scribe for Dr. Arthea Claudene.  I'm seeing this patient by the request  of:  Marylynn Verneita CROME, MD  CC: Back and neck pain follow-up  YEP:Dlagzrupcz  CACIE GASKINS is a 48 y.o. female coming in with complaint of back and neck pain. OMT 03/01/2024. Patient states that she is having an increase in pain in L scapula. Pain radiating down the arm. Pain worse at night and in the morning. Pain subsides throughout the day. Using IBU and heat.   Medications patient has been prescribed:   Taking:         Reviewed prior external information including notes and imaging from previsou exam, outside providers and external EMR if available.   As well as notes that were available from care everywhere and other healthcare systems.  Past medical history, social, surgical and family history all reviewed in electronic medical record.  No pertanent information unless stated regarding to the chief complaint.   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 POSITIVE muscle aches  Objective  Blood pressure 122/82, pulse 76, height 5' 2 (1.575 m), weight 215 lb (97.5 kg), SpO2 98%.   General: No apparent distress alert and oriented x3 mood and affect normal, dressed appropriately.  HEENT: Pupils equal, extraocular movements intact  Respiratory: Patient's speak in full sentences and does not appear short of breath  Cardiovascular: No lower extremity edema, non tender, no erythema  Gait MSK:  Back does have some loss of lordosis.  Neck exam does have significant tightness noted.  Seems to be on the left side with mild positive radicular symptoms in the C7 distribution.  No significant weakness noted.  Osteopathic findings  C2 flexed rotated and side bent right C7 flexed rotated and side bent left T3 extended rotated and side bent left inhaled rib T7 extended rotated and side bent left inhaled rib L2 flexed rotated and side bent right Sacrum right on right       Assessment and Plan:  Slipped rib syndrome Chronic problem that may be more of a cervical radiculopathy then  surely slipped rib.  We will continue to monitor.  Does respond well though to osteopathic manipulation.  We discussed different medicines such as prednisone  and given prescription for 40 mg for 5 days.  Increase activity slowly.  Follow-up again in 6 to 12 weeks.    Nonallopathic problems  Decision today to treat with OMT was based on Physical Exam  After verbal consent patient was treated with HVLA, ME, FPR techniques in cervical, rib, thoracic, lumbar, and sacral  areas  Patient tolerated  the procedure well with improvement in symptoms  Patient given exercises, stretches and lifestyle modifications  See medications in patient instructions if given  Patient will follow up in 4-8 weeks     The above documentation has been reviewed and is accurate and complete Hazeline Charnley M Leena Tiede, DO         Note: This dictation was prepared with Dragon dictation along with smaller phrase technology. Any transcriptional errors that result from this process are unintentional.

## 2024-04-01 NOTE — Patient Instructions (Signed)
 Prednisone  40mg  for 5 days If not great by Monday please write us  See me in 2 months

## 2024-04-02 ENCOUNTER — Encounter: Payer: Self-pay | Admitting: Family Medicine

## 2024-04-03 ENCOUNTER — Other Ambulatory Visit: Payer: Self-pay

## 2024-04-03 DIAGNOSIS — R079 Chest pain, unspecified: Secondary | ICD-10-CM

## 2024-04-03 MED ORDER — PREDNISONE 20 MG PO TABS
40.0000 mg | ORAL_TABLET | Freq: Every day | ORAL | 0 refills | Status: DC
  Filled 2024-04-05: qty 10, 5d supply, fill #0

## 2024-04-04 ENCOUNTER — Ambulatory Visit
Admission: RE | Admit: 2024-04-04 | Discharge: 2024-04-04 | Disposition: A | Discharge status: Home / Self Care | Source: Ambulatory Visit | Attending: Internal Medicine | Admitting: Internal Medicine

## 2024-04-04 DIAGNOSIS — Z1231 Encounter for screening mammogram for malignant neoplasm of breast: Secondary | ICD-10-CM | POA: Diagnosis not present

## 2024-04-05 ENCOUNTER — Other Ambulatory Visit: Payer: Self-pay

## 2024-04-10 ENCOUNTER — Other Ambulatory Visit: Payer: Self-pay

## 2024-04-22 ENCOUNTER — Ambulatory Visit: Admitting: Internal Medicine

## 2024-04-22 ENCOUNTER — Encounter: Payer: Self-pay | Admitting: Internal Medicine

## 2024-04-22 NOTE — Telephone Encounter (Signed)
 Noted

## 2024-04-23 NOTE — Progress Notes (Unsigned)
 Lisa [name] Lisa [name] [address] [place] [address] Phone: [phone] Subjective:    I'm seeing this patient by the request  of:  Lisa Verneita CROME, MD  CC:   Lisa [name]  Lisa [name] is a 48 y.o. female coming in with complaint of back and neck pain. OMT on 04/01/2024. Patient states   Medications patient has been prescribed: prednisone  colchicine   Taking:         Reviewed prior external information including notes and imaging from previsou exam, outside providers and external EMR if available.   As well as notes that were available from care everywhere and other healthcare systems.  Past medical history, social, surgical and family history all reviewed in electronic medical record.  No pertanent information unless stated regarding to the chief complaint.   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 POSITIVE muscle aches  Objective  There were no vitals taken for this visit.   General: No apparent distress alert and oriented x3 mood and affect normal, dressed appropriately.  HEENT: Pupils equal, extraocular movements intact  Respiratory: Patient's speak in full sentences and does not appear short of breath  Cardiovascular: No lower extremity edema, non tender, no erythema  Gait MSK:  Back   Osteopathic findings  C2 flexed rotated and side bent right C6 flexed rotated and side bent left T3 extended rotated and side bent right inhaled rib T9 extended rotated and side bent left L2 flexed rotated and side bent right Sacrum right on right       Assessment and Plan:  No problem-specific Assessment & Plan notes found for this encounter.    Nonallopathic problems  Decision today to treat with OMT was based on Physical Exam  After verbal consent patient was treated with HVLA, ME, FPR techniques in cervical, rib, thoracic, lumbar, and sacral  areas  Patient tolerated the procedure well with improvement in symptoms  Patient given exercises, stretches and lifestyle modifications  See medications in patient instructions if given  Patient will follow up in 4-8 weeks             Note: This dictation was prepared with Dragon dictation along with smaller phrase technology. Any  transcriptional errors that result from this process are unintentional.

## 2024-04-24 ENCOUNTER — Ambulatory Visit: Admitting: Internal Medicine

## 2024-04-24 ENCOUNTER — Encounter: Payer: Self-pay | Admitting: Internal Medicine

## 2024-04-24 VITALS — BP 124/80 | HR 81 | Ht 62.0 in | Wt 209.6 lb

## 2024-04-24 DIAGNOSIS — E785 Hyperlipidemia, unspecified: Secondary | ICD-10-CM

## 2024-04-24 DIAGNOSIS — E1169 Type 2 diabetes mellitus with other specified complication: Secondary | ICD-10-CM

## 2024-04-24 DIAGNOSIS — R0789 Other chest pain: Secondary | ICD-10-CM | POA: Diagnosis not present

## 2024-04-24 DIAGNOSIS — G8929 Other chronic pain: Secondary | ICD-10-CM | POA: Diagnosis not present

## 2024-04-24 NOTE — Assessment & Plan Note (Addendum)
 Present since September with transient improvement but not resolution with limited use of colchicine , massage and heat.  Reviewed September chest x ray  which was normal.  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

## 2024-04-24 NOTE — Progress Notes (Signed)
 Subjective:  Patient ID: Lisa [name], female    DOB: Nov 11, 1975  Age: 48 y.o. MRN: [id number]  CC: The primary encounter diagnosis was Chronic chest wall pain. Diagnoses of Chest wall pain, chronic and Hyperlipidemia associated with type 2 diabetes mellitus (HCC) were also pertinent to this visit.   HPI Lisa [name] presents for  Chief Complaint  Patient presents with   Medical Management of Chronic Issues    Rib pain    Lisa [name] is a 48 yr old female with type 2 DM.obesity, subglottic stenosis and chronic thoracic and cervical spine pain who has been having chest wall /sternal pain since September diagnosis of costochondritis . The pain is episodic , occurring daily,  some days the pain is more intense than others  and sometime the pain rediates form the sternum to the right or to the left.  Does not occur with sleep.  Sleeps on stomach  started in September , diagnosed with costochondritis during episode of bronchitis.  Had imaging with chest  x ray.  D dimer was normal so CTA not done.  .she was given a limited course of  colchicine   during October  sports medicine visit.  and felt that the colchicine   helped .  the pain was  aggravated by sports medicine adjustment  in  November  for left shoulder pain, caused severe sternal pain that lasted 24 hours and was treated with a short prednisone  course which resolved the sternal pain but not the radiation . She notes persistent tenderness t o palpation of the  muscles between the ribs which is worse during flares.  It does not hurt to cough .  Massage therapy and heat help.    Outpatient Medications Prior to Visit  Medication Sig Dispense Refill   albuterol  (PROVENTIL ) (2.5 MG/3ML) 0.083% nebulizer solution Take 3 mLs (2.5 mg total) by nebulization every 6 (six) hours as needed for wheezing or shortness of breath. 75 mL 2   albuterol  (VENTOLIN  HFA) 108 (90 Base) MCG/ACT inhaler Inhale 2 puffs into the lungs every 6 (six) hours as  needed for wheezing or shortness of breath. 6.7 g 0   cetirizine (ZYRTEC) 10 MG tablet Take 10 mg by mouth daily.     cholecalciferol (VITAMIN D3) 25 MCG (1000 UNIT) tablet Take 1,000 Units by mouth daily.     colchicine  0.6 MG tablet Take 1 tablet (0.6 mg total) by mouth 2 (two) times daily. 10 tablet 0   etonogestrel  (NEXPLANON ) 68 MG IMPL implant 1 each by Subdermal route once.     fluticasone  (FLONASE ) 50 MCG/ACT nasal spray Place 2 sprays into both nostrils daily. 16 g 2   Fluticasone -Umeclidin-Vilant (TRELEGY ELLIPTA ) 100-62.5-25 MCG/ACT AEPB Inhale 1 puff into the lungs daily. 60 each 11   folic acid  (FOLVITE ) 1 MG tablet Take 1 tablet (1 mg total) by mouth daily. 90 tablet 1   Magnesium 100 MG TABS Take 1 tablet by mouth daily.     Nebulizers (COMPRESSOR/NEBULIZER) MISC Use as needed for bronchodilator administration 1 each 0   pantoprazole  (PROTONIX ) 40 MG tablet Take 1 tablet (40 mg total) by mouth 2 (two) times daily before a meal. 60 tablet 3   scopolamine  (TRANSDERM-SCOP) 1 MG/3DAYS Place 1 patch (1.5 mg total) onto the skin every 3 (three) days. 4 patch 0   spironolactone  (ALDACTONE ) 25 MG tablet Take 1 tablet (25 mg total) by mouth daily. 90 tablet 3   tirzepatide  (MOUNJARO ) 12.5 MG/0.5ML Pen Inject 12.5 mg into  the skin once a week. 6 mL 2   Turmeric 400 MG CAPS Take 400 mg by mouth daily.      vitamin B-12 (CYANOCOBALAMIN ) 100 MCG tablet Take 100 mcg by mouth daily.     predniSONE  (DELTASONE ) 20 MG tablet Take 2 tablets (40 mg total) by mouth daily with breakfast. 10 tablet 0   No facility-administered medications prior to visit.    Review of Systems;  Patient denies headache, fevers, malaise, unintentional weight loss, skin rash, eye pain, sinus congestion and sinus pain, sore throat, dysphagia,  hemoptysis , cough, dyspnea, wheezing, chest pain, palpitations, orthopnea, edema, abdominal pain, nausea, melena, diarrhea, constipation, flank pain, dysuria, hematuria, urinary   Frequency, nocturia, numbness, tingling, seizures,  Focal weakness, Loss of consciousness,  Tremor, insomnia, depression, anxiety, and suicidal ideation.      Objective:  BP 124/80   Pulse 81   Ht 5' 2 (1.575 m)   Wt 209 lb 9.6 oz (95.1 kg)   SpO2 99%   BMI 38.34 kg/m   BP Readings from Last 3 Encounters:  04/25/24 116/76  04/24/24 124/80  04/01/24 122/82    Wt Readings from Last 3 Encounters:  04/25/24 211 lb (95.7 kg)  04/24/24 209 lb 9.6 oz (95.1 kg)  04/01/24 215 lb (97.5 kg)    Physical Exam Vitals reviewed.  Constitutional:      General: She is not in acute distress.    Appearance: Normal appearance. She is normal weight. She is not ill-appearing, toxic-appearing or diaphoretic.  HENT:     Head: Normocephalic.  Eyes:     General: No scleral icterus.       Right eye: No discharge.        Left eye: No discharge.     Conjunctiva/sclera: Conjunctivae normal.  Cardiovascular:     Rate and Rhythm: Normal rate and regular rhythm.     Heart sounds: Normal heart sounds.  Pulmonary:     Effort: Pulmonary effort is normal. No respiratory distress.     Breath sounds: Normal breath sounds.  Musculoskeletal:        General: Tenderness present. Normal range of motion.       Arms:     Comments: Chest wall tenderness to papation   Skin:    General: Skin is warm and dry.  Neurological:     General: No focal deficit present.     Mental Status: She is alert and oriented to person, place, and time. Mental status is at baseline.  Psychiatric:        Mood and Affect: Mood normal.        Behavior: Behavior normal.        Thought Content: Thought content normal.        Judgment: Judgment normal.     Lab Results  Component Value Date   HGBA1C 5.5 01/11/2024   HGBA1C 5.4 07/14/2023   HGBA1C 5.5 03/29/2023    Lab Results  Component Value Date   CREATININE 0.75 01/11/2024   CREATININE 0.69 07/14/2023   CREATININE 0.71 03/29/2023    Lab Results  Component Value  Date   WBC 8.2 10/30/2023   HGB 14.7 10/30/2023   HCT 46.2 10/30/2023   PLT 304 10/30/2023   GLUCOSE 86 01/11/2024   CHOL 172 01/11/2024   TRIG 124.0 01/11/2024   HDL 45.10 01/11/2024   LDLDIRECT 102.0 01/11/2024   LDLCALC 102 (H) 01/11/2024   ALT 15 01/11/2024   AST 13 01/11/2024   NA 139 01/11/2024  K 4.2 01/11/2024   CL 106 01/11/2024   CREATININE 0.75 01/11/2024   BUN 12 01/11/2024   CO2 25 01/11/2024   TSH 1.33 07/14/2023   HGBA1C 5.5 01/11/2024   MICROALBUR <0.7 07/14/2023    MM 3D SCREENING MAMMOGRAM BILATERAL BREAST Result Date: 04/10/2024 CLINICAL DATA:  Screening. EXAM: DIGITAL SCREENING BILATERAL MAMMOGRAM WITH TOMOSYNTHESIS AND CAD TECHNIQUE: Bilateral screening digital craniocaudal and mediolateral oblique mammograms were obtained. Bilateral screening digital breast tomosynthesis was performed. The images were evaluated with computer-aided detection. COMPARISON:  Previous exam(s). ACR Breast Density Category b: There are scattered areas of fibroglandular density. FINDINGS: There are no findings suspicious for malignancy. IMPRESSION: No mammographic evidence of malignancy. A result letter of this screening mammogram will be mailed directly to the patient. RECOMMENDATION: Screening mammogram in one year. (Code:SM-B-01Y) BI-RADS CATEGORY  1: Negative. Electronically Signed   By: Alm Parkins M.D.   On: 04/10/2024 08:07    Assessment & Plan:  .Chronic chest wall pain -     CT CHEST W CONTRAST; Future  Chest wall pain, chronic Assessment & Plan: Present since September with transient improvement but not resolution with limited use of colchicine , massage and heat.  Reviewed September chest x ray  which was normal.  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  Repeat labs reviewed; ;   she continues to defer trial of statin for primary prevention and goal LDL , 70 given concurrent type 2 DM   Lab Results  Component Value Date   CHOL 172 01/11/2024   HDL 45.10 01/11/2024   LDLCALC 102 (H) 01/11/2024   LDLDIRECT 102.0 01/11/2024   TRIG 124.0 01/11/2024   CHOLHDL 4 01/11/2024         I spent 30 minutes on the day of this face to face encounter reviewing patient's  most recent visit with cardiology,  nephrology,  and neurology,  prior relevant surgical and non surgical procedures, recent  labs and imaging studies, counseling on weight management,  reviewing the assessment and plan with patient, and post visit ordering and reviewing of  diagnostics and therapeutics with patient  .   Follow-up: No follow-ups on file.   Verneita LITTIE Kettering, MD

## 2024-04-25 ENCOUNTER — Encounter: Payer: Self-pay | Admitting: Family Medicine

## 2024-04-25 ENCOUNTER — Ambulatory Visit: Payer: Self-pay | Admitting: Internal Medicine

## 2024-04-25 ENCOUNTER — Ambulatory Visit: Admitting: Family Medicine

## 2024-04-25 ENCOUNTER — Ambulatory Visit
Admission: RE | Admit: 2024-04-25 | Discharge: 2024-04-25 | Disposition: A | Discharge status: Home / Self Care | Source: Ambulatory Visit | Attending: Internal Medicine | Admitting: Internal Medicine

## 2024-04-25 VITALS — BP 116/76 | HR 89 | Ht 62.0 in | Wt 211.0 lb

## 2024-04-25 DIAGNOSIS — R0789 Other chest pain: Secondary | ICD-10-CM | POA: Diagnosis not present

## 2024-04-25 DIAGNOSIS — G8929 Other chronic pain: Secondary | ICD-10-CM | POA: Diagnosis present

## 2024-04-25 DIAGNOSIS — R29898 Other symptoms and signs involving the musculoskeletal system: Secondary | ICD-10-CM | POA: Diagnosis not present

## 2024-04-25 DIAGNOSIS — M5412 Radiculopathy, cervical region: Secondary | ICD-10-CM | POA: Diagnosis not present

## 2024-04-25 DIAGNOSIS — M9902 Segmental and somatic dysfunction of thoracic region: Secondary | ICD-10-CM

## 2024-04-25 DIAGNOSIS — M9904 Segmental and somatic dysfunction of sacral region: Secondary | ICD-10-CM

## 2024-04-25 DIAGNOSIS — M9901 Segmental and somatic dysfunction of cervical region: Secondary | ICD-10-CM

## 2024-04-25 DIAGNOSIS — M9903 Segmental and somatic dysfunction of lumbar region: Secondary | ICD-10-CM

## 2024-04-25 DIAGNOSIS — M9908 Segmental and somatic dysfunction of rib cage: Secondary | ICD-10-CM | POA: Diagnosis not present

## 2024-04-25 MED ORDER — IOHEXOL 300 MG/ML  SOLN
75.0000 mL | Freq: Once | INTRAMUSCULAR | Status: AC | PRN
  Administered 2024-04-25: 75 mL via INTRAVENOUS

## 2024-04-25 NOTE — Assessment & Plan Note (Addendum)
 10 yr risk is low  Based on prior labs.  Repeat labs reviewed; ;   she continues to defer trial of statin for primary prevention and goal LDL , 70 given concurrent type 2 DM   Lab Results  Component Value Date   CHOL 172 01/11/2024   HDL 45.10 01/11/2024   LDLCALC 102 (H) 01/11/2024   LDLDIRECT 102.0 01/11/2024   TRIG 124.0 01/11/2024   CHOLHDL 4 01/11/2024

## 2024-04-25 NOTE — Assessment & Plan Note (Signed)
 Weakness is a continues to have the difficulty with it.  A CT of the chest was ordered and still awaiting read on this.  When looking at it seems to be more of a sesamoid at the sternal xiphoid process.  Do not think that this would be contributing as much.  If continuing to have pain I do think we need to consider the possibility of MRI of the chest with contrast.

## 2024-04-25 NOTE — Patient Instructions (Signed)
 Good to see you! Stay active See you again in 6-12 weeks

## 2024-04-25 NOTE — Assessment & Plan Note (Signed)
 Continue to monitor.  Will continue to see how patient responds to osteopathic manipulation.  Continue to work on core strengthening.  Follow-up with me again in 6 to 8 weeks

## 2024-04-26 ENCOUNTER — Other Ambulatory Visit: Payer: Self-pay

## 2024-04-26 MED ORDER — PREDNISONE 20 MG PO TABS
40.0000 mg | ORAL_TABLET | Freq: Every day | ORAL | 0 refills | Status: DC
  Filled 2024-04-26: qty 10, 5d supply, fill #0

## 2024-04-29 ENCOUNTER — Ambulatory Visit: Admitting: Internal Medicine

## 2024-05-02 ENCOUNTER — Ambulatory Visit: Admitting: Family Medicine

## 2024-05-05 ENCOUNTER — Encounter: Payer: Self-pay | Admitting: Pulmonary Disease

## 2024-05-05 DIAGNOSIS — U071 COVID-19: Secondary | ICD-10-CM

## 2024-05-06 ENCOUNTER — Encounter

## 2024-05-06 ENCOUNTER — Other Ambulatory Visit: Payer: Self-pay

## 2024-05-06 MED ORDER — DOXYCYCLINE HYCLATE 100 MG PO TABS
100.0000 mg | ORAL_TABLET | Freq: Two times a day (BID) | ORAL | 0 refills | Status: AC
  Filled 2024-05-06: qty 14, 7d supply, fill #0

## 2024-05-06 MED ORDER — METHYLPREDNISOLONE 4 MG PO TBPK
ORAL_TABLET | ORAL | 0 refills | Status: DC
  Filled 2024-05-06: qty 21, 6d supply, fill #0

## 2024-05-06 NOTE — Telephone Encounter (Signed)
 Will send in Medrol  Dosepak and antibiotic.  She should be tested for the usual suspect viruses.

## 2024-05-07 ENCOUNTER — Other Ambulatory Visit: Payer: Self-pay

## 2024-05-07 ENCOUNTER — Ambulatory Visit: Payer: Self-pay

## 2024-05-07 MED ORDER — MOLNUPIRAVIR 200 MG PO CAPS
4.0000 | ORAL_CAPSULE | Freq: Two times a day (BID) | ORAL | 0 refills | Status: AC
  Filled 2024-05-07: qty 40, 5d supply, fill #0

## 2024-05-07 NOTE — Telephone Encounter (Signed)
 Sent prescription to pharmacy for Covid Rx.

## 2024-05-07 NOTE — Telephone Encounter (Signed)
 Patient was notified and made aware of Dr Graylon recommendations. Patient verbalized understanding and has no further questions. Patient appointment was cancelled.

## 2024-05-07 NOTE — Telephone Encounter (Signed)
 Medical record reviewed. Patient has prescription for Molnupiravir , doxycycline  and methylprednisolone  sent by Dr. Tamea this morning. Recommend follow up with PCP if her symptoms are not improved with above treatment.    Thank you,  Luke Shade, MD

## 2024-05-07 NOTE — Telephone Encounter (Signed)
 Pt is scheduled to see Dr. Abbey tomorrow.

## 2024-05-07 NOTE — Telephone Encounter (Signed)
 FYI Only or Action Required?: FYI only for provider: appointment scheduled on 05/08/2024.  Patient was last seen in primary care on 04/24/2024 by Marylynn Verneita CROME, MD.  Called Nurse Triage reporting Covid Positive.  Symptoms began several days ago.  Interventions attempted: OTC medications: Tylenol  and Prescription medications: doxycycline  (VIBRA -TABS) 100 MG tablet.  Symptoms are: unchanged.  Triage Disposition: See HCP Within 4 Hours (Or PCP Triage)  Patient/caregiver understands and will follow disposition?: No       Message from Amber H sent at 05/07/2024  8:50 AM EST  Reason for Triage: Patient complaining of sore throat, cough, congestion with  no phlegm. Denied SOB and Chest pain. She wanted to be seen today however, I advised patient she has the earliest appt time tomorrow. Thinks she has COVID.   [phone]   Reason for Disposition  MILD difficulty breathing (e.g., minimal/no SOB at rest, SOB with walking, pulse < 100)  Answer Assessment - Initial Assessment Questions Pt states she did an at home test for COVID and it was positive. Pt states her pulmonologist called in an antiviral drug for her. Per chart review, this RN sees that doxycycline  (VIBRA -TABS) 100 MG tablet was sent in yesterday. Pt already has an appointment tomorrow and there are no appointments available today. Pt did not want to go to urgent care. This RN educated pt on new-worsening symptoms and when to call back/seek emergent care. Pt verbalized understanding and agrees to plan.   Onset: Friday afternoon  Symptoms: Sore throat- 4-5/10 pain level Headache pretty constant 4/10 pain level Dry cough A little bit of SOB with activity A little bit of chest pain with coughing  Denies: Fever (did have some chills yesterday)  Protocols used: COVID-19 - Diagnosed or Suspected-A-AH

## 2024-05-07 NOTE — Addendum Note (Signed)
 Addended by: TAMEA DEDRA CROME on: 05/07/2024 09:06 AM   Modules accepted: Orders

## 2024-05-08 ENCOUNTER — Ambulatory Visit

## 2024-05-13 ENCOUNTER — Other Ambulatory Visit: Payer: Self-pay

## 2024-05-14 ENCOUNTER — Other Ambulatory Visit (HOSPITAL_COMMUNITY): Payer: Self-pay

## 2024-05-14 ENCOUNTER — Other Ambulatory Visit: Payer: Self-pay

## 2024-05-22 ENCOUNTER — Ambulatory Visit: Admitting: Family Medicine

## 2024-05-24 ENCOUNTER — Ambulatory Visit: Admitting: Internal Medicine

## 2024-06-04 ENCOUNTER — Ambulatory Visit: Admitting: Family Medicine

## 2024-06-10 ENCOUNTER — Other Ambulatory Visit (HOSPITAL_COMMUNITY): Payer: Self-pay

## 2024-06-10 ENCOUNTER — Ambulatory Visit: Admitting: Family Medicine

## 2024-06-10 ENCOUNTER — Other Ambulatory Visit: Payer: Self-pay | Admitting: Internal Medicine

## 2024-06-10 DIAGNOSIS — K2100 Gastro-esophageal reflux disease with esophagitis, without bleeding: Secondary | ICD-10-CM

## 2024-06-11 ENCOUNTER — Other Ambulatory Visit: Payer: Self-pay

## 2024-06-11 ENCOUNTER — Ambulatory Visit: Admitting: Internal Medicine

## 2024-06-11 NOTE — Progress Notes (Unsigned)
 " [facility] [address] [place] [address] Phone: [phone] Subjective:   Lisa [name], am serving as a scribe for Dr. Arthea Claudene.  I'm seeing this patient by the request  of:  Marylynn Verneita CROME, MD  CC: Back and neck pain follow-up  YEP:Dlagzrupcz  KATINA REMICK is a 49 y.o. female coming in with complaint of back and neck pain. OMT 04/25/2024. Patient states that she has been having a flare of neck with pain radiating down to her hand. No tingling.   Medications patient has been prescribed: None  Taking:         Reviewed prior external information including notes and imaging from previsou exam, outside providers and external EMR if available.   As well as notes that were available from care everywhere and other healthcare systems.  Past medical history, social, surgical and family history all reviewed in electronic medical record.  No pertanent information unless stated regarding to the chief complaint.   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 POSITIVE muscle aches  Objective  Blood pressure 116/84, pulse 81, height 5' 2 (1.575 m), SpO2 98%.   General: No apparent distress alert and oriented x3 mood and affect normal, dressed appropriately.  HEENT: Pupils equal, extraocular movements intact  Respiratory: Patient's speak in full sentences and does not appear short of breath  Cardiovascular: No lower extremity edema, non tender, no erythema  Neck exam does have some loss of lordosis noted.  Some tenderness to palpation noted.  Positive Spurling's on the left side of the neck noted.  Osteopathic findings  C2 flexed rotated and side bent left C7 flexed rotated and side bent left T3 extended rotated and side bent left inhaled rib T9 extended rotated and side bent left L2 flexed rotated and side bent right L3 flexed rotated and side bent left Sacrum right on right       Assessment and Plan:  Cervical radiculopathy at C7 Unfortunately chronic problem with exacerbation noted.  We discussed icing regimen and home exercises, discussed with patient we will be slowly.  Follow-up again in 6 to 12 weeks.  Encouraged Robaxin  as well as gabapentin .  Worsening pain advanced imaging would be warranted.  Patient likely has responded well to conservative therapy previously.  Prednisone  40 mg for 5 days also prescribed    Nonallopathic problems  Decision today to treat with OMT  was based on Physical Exam  After verbal consent patient was treated with HVLA, ME, FPR techniques in cervical, rib, thoracic, lumbar, and sacral  areas avoided HVLA on the neck ended more FPR technique  Patient tolerated the procedure well with improvement in symptoms  Patient given exercises, stretches and lifestyle modifications  See medications in patient instructions if given  Patient will follow up in 4-8  weeks    The above documentation has been reviewed and is accurate and complete Arthea CHRISTELLA Sharps, DO          Note: This dictation was prepared with Dragon dictation along with smaller phrase technology. Any transcriptional errors that result from this process are unintentional.            [1]  Allergies Allergen Reactions   Chlorhexidine  Gluconate     Developed pruritic rash on abdomen from prep for lap chole Mar 09 2022   Latex Hives    Rash and hives   "

## 2024-06-12 ENCOUNTER — Other Ambulatory Visit (HOSPITAL_COMMUNITY): Payer: Self-pay

## 2024-06-12 MED ORDER — PANTOPRAZOLE SODIUM 40 MG PO TBEC
40.0000 mg | DELAYED_RELEASE_TABLET | Freq: Two times a day (BID) | ORAL | 3 refills | Status: AC
  Filled 2024-06-12: qty 180, 90d supply, fill #0

## 2024-06-13 ENCOUNTER — Ambulatory Visit: Admitting: Family Medicine

## 2024-06-13 ENCOUNTER — Other Ambulatory Visit: Payer: Self-pay

## 2024-06-13 ENCOUNTER — Encounter: Payer: Self-pay | Admitting: Family Medicine

## 2024-06-13 VITALS — BP 116/84 | HR 81 | Ht 62.0 in

## 2024-06-13 DIAGNOSIS — M9901 Segmental and somatic dysfunction of cervical region: Secondary | ICD-10-CM

## 2024-06-13 DIAGNOSIS — M5412 Radiculopathy, cervical region: Secondary | ICD-10-CM

## 2024-06-13 DIAGNOSIS — M9902 Segmental and somatic dysfunction of thoracic region: Secondary | ICD-10-CM

## 2024-06-13 DIAGNOSIS — M9908 Segmental and somatic dysfunction of rib cage: Secondary | ICD-10-CM | POA: Diagnosis not present

## 2024-06-13 DIAGNOSIS — M9903 Segmental and somatic dysfunction of lumbar region: Secondary | ICD-10-CM

## 2024-06-13 DIAGNOSIS — M9904 Segmental and somatic dysfunction of sacral region: Secondary | ICD-10-CM | POA: Diagnosis not present

## 2024-06-13 MED ORDER — KETOROLAC TROMETHAMINE 60 MG/2ML IM SOLN
60.0000 mg | Freq: Once | INTRAMUSCULAR | Status: AC
  Administered 2024-06-13: 60 mg via INTRAMUSCULAR

## 2024-06-13 MED ORDER — METHYLPREDNISOLONE ACETATE 80 MG/ML IJ SUSP
80.0000 mg | Freq: Once | INTRAMUSCULAR | Status: AC
  Administered 2024-06-13: 80 mg via INTRAMUSCULAR

## 2024-06-13 MED ORDER — METHOCARBAMOL 500 MG PO TABS
500.0000 mg | ORAL_TABLET | Freq: Every day | ORAL | 0 refills | Status: AC
  Filled 2024-06-13: qty 30, 30d supply, fill #0

## 2024-06-13 MED ORDER — PREDNISONE 20 MG PO TABS
40.0000 mg | ORAL_TABLET | Freq: Every day | ORAL | 0 refills | Status: AC
  Filled 2024-06-13: qty 10, 5d supply, fill #0

## 2024-06-13 NOTE — Patient Instructions (Addendum)
 Injections in backside. No NSAIDs for 24 hours.  Prednisone  40mg  for 5 days. Start tomorrow. Robaxin  500mg  at night See me again in 6 weeks

## 2024-06-13 NOTE — Assessment & Plan Note (Addendum)
 Unfortunately chronic problem with exacerbation noted.  We discussed icing regimen and home exercises, discussed with patient we will be slowly.  Follow-up again in 6 to 12 weeks.  Encouraged Robaxin  as well as gabapentin .  Worsening pain advanced imaging would be warranted.  Patient likely has responded well to conservative therapy previously.  Prednisone  40 mg for 5 days also prescribed

## 2024-06-17 ENCOUNTER — Encounter: Payer: Self-pay | Admitting: Family Medicine

## 2024-06-18 ENCOUNTER — Other Ambulatory Visit: Payer: Self-pay

## 2024-06-18 MED ORDER — METHYLPREDNISOLONE 4 MG PO TBPK
ORAL_TABLET | ORAL | 0 refills | Status: AC
  Filled 2024-06-18: qty 21, 6d supply, fill #0

## 2024-06-18 MED ORDER — GABAPENTIN 100 MG PO CAPS
200.0000 mg | ORAL_CAPSULE | Freq: Every day | ORAL | 3 refills | Status: AC
  Filled 2024-06-18: qty 60, 30d supply, fill #0

## 2024-06-19 ENCOUNTER — Other Ambulatory Visit: Payer: Self-pay

## 2024-06-24 ENCOUNTER — Ambulatory Visit: Admitting: Family Medicine

## 2024-06-26 ENCOUNTER — Ambulatory Visit: Admitting: Family Medicine

## 2024-07-09 ENCOUNTER — Ambulatory Visit: Admitting: Family Medicine

## 2024-07-17 ENCOUNTER — Ambulatory Visit: Admitting: Internal Medicine

## 2024-07-17 ENCOUNTER — Ambulatory Visit: Admitting: Family Medicine

## 2024-07-25 ENCOUNTER — Ambulatory Visit: Admitting: Family Medicine
